# Patient Record
Sex: Female | Born: 1995 | Hispanic: No | Marital: Married | Smoking: Never smoker
Health system: Southern US, Community
[De-identification: ages and names within clinical notes are randomized; demographics above are authoritative.]

## PROBLEM LIST (undated history)

## (undated) DIAGNOSIS — F319 Bipolar disorder, unspecified: Secondary | ICD-10-CM

## (undated) DIAGNOSIS — F32A Depression, unspecified: Secondary | ICD-10-CM

## (undated) DIAGNOSIS — F329 Major depressive disorder, single episode, unspecified: Secondary | ICD-10-CM

## (undated) DIAGNOSIS — F209 Schizophrenia, unspecified: Secondary | ICD-10-CM

## (undated) DIAGNOSIS — E119 Type 2 diabetes mellitus without complications: Secondary | ICD-10-CM

## (undated) HISTORY — DX: Type 2 diabetes mellitus without complications: E11.9

## (undated) HISTORY — DX: Depression, unspecified: F32.A

## (undated) HISTORY — DX: Major depressive disorder, single episode, unspecified: F32.9

---
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
## 2016-05-13 ENCOUNTER — Encounter (HOSPITAL_COMMUNITY): Payer: Self-pay | Admitting: Emergency Medicine

## 2016-08-07 ENCOUNTER — Encounter (HOSPITAL_COMMUNITY): Payer: Self-pay | Admitting: Emergency Medicine

## 2016-08-07 ENCOUNTER — Other Ambulatory Visit: Payer: Self-pay | Admitting: Psychiatry

## 2016-08-07 ENCOUNTER — Emergency Department (HOSPITAL_COMMUNITY)
Admission: EM | Admit: 2016-08-07 | Discharge: 2016-08-08 | Disposition: A | Discharge status: Other Acute Inpatient Hospital | Payer: Self-pay | Attending: Emergency Medicine | Admitting: Emergency Medicine

## 2016-08-07 DIAGNOSIS — R4689 Other symptoms and signs involving appearance and behavior: Secondary | ICD-10-CM

## 2016-08-07 DIAGNOSIS — F312 Bipolar disorder, current episode manic severe with psychotic features: Secondary | ICD-10-CM | POA: Diagnosis present

## 2016-08-07 DIAGNOSIS — M79601 Pain in right arm: Secondary | ICD-10-CM | POA: Insufficient documentation

## 2016-08-07 DIAGNOSIS — F332 Major depressive disorder, recurrent severe without psychotic features: Secondary | ICD-10-CM | POA: Insufficient documentation

## 2016-08-07 DIAGNOSIS — Z79899 Other long term (current) drug therapy: Secondary | ICD-10-CM | POA: Insufficient documentation

## 2016-08-07 LAB — CBC WITH DIFFERENTIAL/PLATELET
BASOS ABS: 0 10*3/uL (ref 0.0–0.1)
Basophils Relative: 1 %
EOS PCT: 0 %
Eosinophils Absolute: 0 10*3/uL (ref 0.0–0.7)
HEMATOCRIT: 38.6 % (ref 36.0–46.0)
Hemoglobin: 12.7 g/dL (ref 12.0–15.0)
LYMPHS ABS: 1.4 10*3/uL (ref 0.7–4.0)
Lymphocytes Relative: 28 %
MCH: 27.2 pg (ref 26.0–34.0)
MCHC: 32.9 g/dL (ref 30.0–36.0)
MCV: 82.7 fL (ref 78.0–100.0)
Monocytes Absolute: 0.4 10*3/uL (ref 0.1–1.0)
Monocytes Relative: 8 %
NEUTROS ABS: 3.2 10*3/uL (ref 1.7–7.7)
NEUTROS PCT: 63 %
PLATELETS: 256 10*3/uL (ref 150–400)
RBC: 4.67 MIL/uL (ref 3.87–5.11)
RDW: 12.5 % (ref 11.5–15.5)
WBC: 5 10*3/uL (ref 4.0–10.5)

## 2016-08-07 LAB — BASIC METABOLIC PANEL
Anion gap: 7 (ref 5–15)
BUN: 7 mg/dL (ref 6–20)
CHLORIDE: 107 mmol/L (ref 101–111)
CO2: 26 mmol/L (ref 22–32)
Calcium: 10.2 mg/dL (ref 8.9–10.3)
Creatinine, Ser: 0.75 mg/dL (ref 0.44–1.00)
Glucose, Bld: 91 mg/dL (ref 65–99)
Potassium: 3.8 mmol/L (ref 3.5–5.1)
SODIUM: 140 mmol/L (ref 135–145)

## 2016-08-07 LAB — RAPID URINE DRUG SCREEN, HOSP PERFORMED
Amphetamines: NOT DETECTED
BARBITURATES: NOT DETECTED
BENZODIAZEPINES: NOT DETECTED
COCAINE: NOT DETECTED
OPIATES: NOT DETECTED
Tetrahydrocannabinol: NOT DETECTED

## 2016-08-07 LAB — I-STAT BETA HCG BLOOD, ED (MC, WL, AP ONLY): I-stat hCG, quantitative: <5 m[IU]/mL (ref <5)

## 2016-08-07 LAB — SALICYLATE LEVEL: Salicylate Lvl: <7 mg/dL (ref 2.8–30.0)

## 2016-08-07 LAB — ETHANOL: Alcohol, Ethyl (B): <5 mg/dL (ref <5)

## 2016-08-07 LAB — ACETAMINOPHEN LEVEL: Acetaminophen (Tylenol), Serum: <10 ug/mL — ABNORMAL LOW (ref 10–30)

## 2016-08-07 NOTE — ED Notes (Addendum)
Patient states she will only speak to someone in [place] even though she is speaking [place].   I offered the language line for patient and she started laughing stating she could speak [place], it was a joke.

## 2016-08-07 NOTE — ED Triage Notes (Signed)
TTS at bedside- pt cooperative at present

## 2016-08-07 NOTE — ED Provider Notes (Signed)
[facility]-EMERGENCY DEPT Provider Note    By signing my name below, I, Earmon PhoenixJennifer Waddell, attest that this documentation has been prepared under the direction and in the presence of Melburn HakeNicole Bueford Arp, PA-C. Electronically Signed: Earmon PhoenixJennifer Waddell, ED Scribe. 08/07/16. 4:02 PM.    History   Chief Complaint Chief Complaint  Patient presents with  . Arm Pain    right  . Medical Clearance    The history is provided by the patient and medical records. No language interpreter was used.    Barbara George is a 21 y.o. female with PMHx of depression brought in by EMS who presents to the Emergency Department stating she does not know why she is here. She reports calling EMS but "does not know why". Pt complains that the weather is crazy here. She reports having mosquito bites but states she does not know where on her body they are located. She denies CP, SOB, numbness, tingling or weakness of the lower extremities, abdominal pain, nausea, vomiting. She denies any current pain and will not answer questions appropriately during exam and keeps responding "what is pian", "what is hurt". She denies HI or SI. Denies alcohol or drug use. EMS reports that patient called complaining of right arm pain after getting her Abilify injection yesterday. Nurse states that patient reported she called 911 states he will get out of her house".   Past Medical History:  Diagnosis Date  . Depression     Patient Active Problem List   Diagnosis Date Noted  . Depression 09/21/2014    History reviewed. No pertinent surgical history.  OB History    No data available       Home Medications    Prior to Admission medications   Medication Sig Start Date End Date Taking? Authorizing Provider  risperiDONE (RISPERDAL) 3 MG tablet Take 3 mg by mouth at bedtime.    Historical Provider, MD  silver sulfADIAZINE (SILVADENE) 1 % cream Apply 1 application topically daily. Patient not taking: Reported on 08/07/2016 11/17/14    Carmelina DaneJeffery S Anderson, MD    Family History No family history on file.  Social History Social History  Substance Use Topics  . Smoking status: Never Smoker  . Smokeless tobacco: Never Used  . Alcohol use No     Allergies   Patient has no known allergies.   Review of Systems Review of Systems  Respiratory: Negative for shortness of breath.   Cardiovascular: Negative for chest pain.  Gastrointestinal: Negative for abdominal pain, nausea and vomiting.  Musculoskeletal: Positive for myalgias.  Psychiatric/Behavioral: Negative for suicidal ideas.     Physical Exam Updated Vital Signs BP 133/83 (BP Location: Left Arm)   Pulse 115   Temp 99.1 F (37.3 C) (Oral)   Resp 20   Wt 70.3 kg   SpO2 100%   BMI 23.92 kg/m   Physical Exam  Constitutional: She is oriented to person, place, and time. She appears well-developed and well-nourished. No distress.  HENT:  Head: Normocephalic and atraumatic.  Mouth/Throat: Oropharynx is clear and moist. No oropharyngeal exudate.  Eyes: Conjunctivae and EOM are normal. Pupils are equal, round, and reactive to light. Right eye exhibits no discharge. Left eye exhibits no discharge. No scleral icterus.  Neck: Normal range of motion. Neck supple.  Cardiovascular: Normal rate, regular rhythm, normal heart sounds and intact distal pulses.   Pulmonary/Chest: Effort normal and breath sounds normal. No respiratory distress. She has no wheezes. She has no rales. She exhibits no tenderness.  Abdominal: Soft.  Bowel sounds are normal. She exhibits no distension and no mass. There is no tenderness. There is no rebound and no guarding.  Musculoskeletal: Normal range of motion. She exhibits no edema, tenderness or deformity.  No midline C, T, or L tenderness. Full range of motion of neck and back. Full range of motion of bilateral upper and lower extremities, with 5/5 strength. Sensation intact. 2+ radial and PT pulses. Cap refill <2 seconds. Patient able to  stand and ambulate without assistance.    Neurological: She is alert and oriented to person, place, and time.  Skin: Skin is warm and dry. Capillary refill takes less than 2 seconds. No rash noted. She is not diaphoretic.  Psychiatric: She has a normal mood and affect. Her speech is normal. Cognition and memory are impaired. She expresses inappropriate judgment. She expresses no homicidal and no suicidal ideation.  Inappropriate behavior  Nursing note and vitals reviewed.    ED Treatments / Results  DIAGNOSTIC STUDIES: Oxygen Saturation is 100% on room air, normal by my interpretation.   COORDINATION OF CARE: 11:55 AM- Will order medical clearance labs. Pt verbalizes understanding and agrees to plan.  Medications - No data to display  Labs (all labs ordered are listed, but only abnormal results are displayed) Labs Reviewed  ACETAMINOPHEN LEVEL - Abnormal; Notable for the following:       Result Value   Acetaminophen (Tylenol), Serum <10 (*)    All other components within normal limits  BASIC METABOLIC PANEL  CBC WITH DIFFERENTIAL/PLATELET  SALICYLATE LEVEL  RAPID URINE DRUG SCREEN, HOSP PERFORMED  ETHANOL  I-STAT BETA HCG BLOOD, ED ([facility], [facility], [facility] ONLY)    EKG  EKG Interpretation None       Radiology No results found.  Procedures Procedures (including critical care time)  Medications Ordered in ED Medications - No data to display   Initial Impression / Assessment and Plan / ED Course  I have reviewed the triage vital signs and the nursing notes.  Pertinent labs & imaging results that were available during my care of the patient were reviewed by me and considered in my medical decision making (see chart for details).     Patient presents to the ED via EMS due to initial complaint of right arm pain after receiving her Abilify injection yesterday. Upon initial evaluation patient denies any pain or complaints and states she does not remember why she came to the ED.  Patient continues to answer questions inappropriately and begins staring off and looking up at the ceiling during interview. Patient was also noted to be found standing staring at the wall. VSS. Exam revealed patient with an appropriate behavior, impaired memory. Exam otherwise unremarkable. Labs and urine unremarkable. Patient medically cleared. Consulted TTS for psych evaluation.   I personally performed the services described in this documentation, which was scribed in my presence. The recorded information has been reviewed and is accurate.   Final Clinical Impressions(s) / ED Diagnoses   Final diagnoses:  Abnormal behavior    New Prescriptions New Prescriptions   No medications on file     Barrett Henle, PA-C 08/07/16 1610    Pricilla Loveless, MD 08/10/16 [phone]

## 2016-08-07 NOTE — ED Notes (Signed)
Attempted to call [facility] to [facility]; nursing staff requesting to take report on next shift, will pass on to oncoming nurse.
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
## 2016-08-07 NOTE — ED Notes (Addendum)
[facility] doesn't wish to take this patient because patient is not IVC and would like the [facility]

## 2016-08-07 NOTE — BH Assessment (Addendum)
Assessment Note  Barbara George is an 21 y.o. female with history of depression. Patient was brought in by EMS. Sts that she called EMS with the complaint of a "hurting heart". Patient was not able to provide any further explanations. She does acknowledge a psychiatric history stating she has been hospitalized many places. Sts, "I was at [facility], "some place in [place], [place], and [place]". She was unable to explain the reason for admissions to those facilities. Writer was unable to find any support documentation of INPT admissions in the [facility] systems. Patient reports suicide thoughts with no plan. Sts she has a history of previous suicide thoughts but could never follow through because of God. Patient stating she is depressed because her mother is in [place] and she is here in [place]. She denies self mutilating behaviors. No HI. Patient denies AVH's. However, it's questionable as patient appeared to be responding to internal stimuli at times. She would often seem distracted during our interaction and often stare at the wall or door. Patient would also laugh inappropriately. She is oriented to person and place. She did not know the date or situation. She denies alcohol and drug use.   Diagnosis: Major Depressive Disorder, Recurrent, Severe, without psychotic features  Past Medical History:  Past Medical History:  Diagnosis Date  . Depression     History reviewed. No pertinent surgical history.  Family History: No family history on file.  Social History:  reports that she has never smoked. She has never used smokeless tobacco. She reports that she does not drink alcohol or use drugs.  Additional Social History:  Alcohol / Drug Use Pain Medications: SEE MAR Prescriptions: SEE MAR Over the Counter: SEE MAR History of alcohol / drug use?: No history of alcohol / drug abuse  CIWA: CIWA-Ar BP: 131/73 Pulse Rate: 106 COWS:    Allergies: No Known Allergies  Home Medications:  (Not in a  hospital admission)  OB/GYN Status:  No LMP recorded.  General Assessment Data [facility] of Assessment: [facility] TTS Assessment: In system Is this a Tele or Face-to-Face Assessment?: Face-to-Face Is this an Initial Assessment or a Re-assessment for this encounter?: Initial Assessment Marital status: Other (comment) Is patient pregnant?: No Pregnancy Status: Unknown Living Arrangements: Other (Comment), Other relatives, Spouse/significant other (father and 2 brothers) Can pt return to current living arrangement?: Yes Admission Status: Voluntary Is patient capable of signing voluntary admission?: Yes Referral Source: Self/Family/Friend Insurance type:  (Sef Pay)     Crisis Care Plan Living Arrangements: Other (Comment), Other relatives, Spouse/significant other (father and 2 brothers) Legal Guardian: Other: (no guardian ) Name of Psychiatrist:  (no psychiatrist ) Name of Therapist:  (no therapist )  Education Status Is patient currently in school?: No Current Grade:  (n/) Highest grade of school patient has completed:  (H.S.) Name of school:  (n/a) Contact person:  (n/a)  Risk to self with the past 6 months Suicidal Ideation: Yes-Currently Present Has patient been a risk to self within the past 6 months prior to admission? : Yes Suicidal Intent: Yes-Currently Present Has patient had any suicidal intent within the past 6 months prior to admission? : Yes Is patient at risk for suicide?: Yes Suicidal Plan?: Yes-Currently Present (pt sts she has a plan but would not provide details ) Has patient had any suicidal plan within the past 6 months prior to admission? : No Specify Current Suicidal Plan:  (no plan reported) Access to Means: No What has been your use of drugs/alcohol within  the last 12 months?:  (denies ) Previous Attempts/Gestures:  (pt reports thoughts only; she did not report a prior attempt) How many times?:  (0) Other Self Harm Risks:  (denies ) Triggers for Past  Attempts: Other (Comment) (patient did not explain ) Intentional Self Injurious Behavior:  (no self injurious behavior) Family Suicide History: No Recent stressful life event(s): Other (Comment) ("My mom is in [place]") Persecutory voices/beliefs?: No Depression: Yes Depression Symptoms: Feeling angry/irritable, Feeling worthless/self pity, Loss of interest in usual pleasures, Guilt, Fatigue, Isolating, Tearfulness, Despondent, Insomnia Substance abuse history and/or treatment for substance abuse?: No Suicide prevention information given to non-admitted patients: Not applicable  Risk to Others within the past 6 months Homicidal Ideation: No Does patient have any lifetime risk of violence toward others beyond the six months prior to admission? : No Thoughts of Harm to Others: No Current Homicidal Intent: No Current Homicidal Plan: No Access to Homicidal Means: No Identified Victim:  (n/a) History of harm to others?: No Assessment of Violence: None Noted Violent Behavior Description:  (anxious; cooperative with redirection) Does patient have access to weapons?: No Criminal Charges Pending?: No Does patient have a court date: No Is patient on probation?: No  Psychosis Hallucinations: None noted Delusions: None noted  Mental Status Report Appearance/Hygiene: Disheveled Eye Contact: Good Motor Activity: Freedom of movement Speech: Logical/coherent Level of Consciousness: Alert Mood: Depressed Affect: Appropriate to circumstance Anxiety Level: None Thought Processes: Coherent, Relevant Judgement: Impaired Orientation: Person, Place, Time, Situation Obsessive Compulsive Thoughts/Behaviors: None  Cognitive Functioning Concentration: Normal Memory: Remote Intact, Recent Intact IQ: Average Insight: Poor Impulse Control: Poor Appetite: Fair Weight Loss:  (none reported ) Weight Gain:  (none reported) Sleep: Decreased Total Hours of Sleep:  ("I don't know") Vegetative  Symptoms: None  ADLScreening [facility] Assessment Services) Patient's cognitive ability adequate to safely complete daily activities?: No Patient able to express need for assistance with ADLs?: Yes Independently performs ADLs?: Yes (appropriate for developmental age)  Prior Inpatient Therapy Prior Inpatient Therapy: Yes Prior Therapy Dates:  ("I don't know") Prior Therapy Facilty/Provider(s):  ("I don't know") Reason for Treatment:  ("I don't know")  Prior Outpatient Therapy Prior Outpatient Therapy: No Prior Therapy Dates:  (n/a) Prior Therapy Facilty/Provider(s):  (n/a) Reason for Treatment:  (n/a) Does patient have an ACCT team?: No Does patient have Intensive In-House Services?  : No Does patient have [facility] services? : No Does patient have P4CC services?: No  ADL Screening (condition at time of admission) Patient's cognitive ability adequate to safely complete daily activities?: No Is the patient deaf or have difficulty hearing?: No Does the patient have difficulty seeing, even when wearing glasses/contacts?: No Does the patient have difficulty concentrating, remembering, or making decisions?: Yes Patient able to express need for assistance with ADLs?: Yes Does the patient have difficulty dressing or bathing?: No Independently performs ADLs?: Yes (appropriate for developmental age) Does the patient have difficulty walking or climbing stairs?: No Weakness of Legs: None Weakness of Arms/Hands: None  Home Assistive Devices/Equipment Home Assistive Devices/Equipment: None    Abuse/Neglect Assessment (Assessment to be complete while patient is alone) Physical Abuse: Denies Verbal Abuse: Denies Sexual Abuse: Denies Exploitation of patient/patient's resources: Denies Self-Neglect: Denies Values / Beliefs Cultural Requests During Hospitalization: None Spiritual Requests During Hospitalization: None   Advance Directives (For Healthcare) Does Patient Have a Medical Advance  Directive?: No Would patient like information on creating a medical advance directive?: No - Patient declined Nutrition Screen- [facility] Patient's home diet: Regular  Additional  Information 1:1 In Past 12 Months?: No CIRT Risk: No Elopement Risk: No Does patient have medical clearance?: Yes     Disposition:  Disposition Initial Assessment Completed for this Encounter: Yes  On Site Evaluation by:   Reviewed with Physician:    Melynda Ripple 08/07/2016 1:47 PM

## 2016-08-07 NOTE — ED Triage Notes (Signed)
Pt was escorted to bathroom. When asked for urine specimen , she dipped cup in toilet, then put paper in cup with urine.

## 2016-08-07 NOTE — ED Notes (Signed)
Note to hold for anni penn charted in error.  disregard

## 2016-08-07 NOTE — ED Provider Notes (Signed)
Received care of patient at 4 PM. Please see prior notes for history and physical. Patient is a 21 year old female who presented a with bizarre behavior. On my exam, patient appears to be responding to internal stimuli. She reports that she is here in the emergency department because she "feel like Aisha, the wife of the prophet Gwenyth BouillonMohammad."  When asked other questions she appears distracted at times reports "he's gone now." Reports suicidal ideation at times.  IVC placed.   Pt accepted for inpt.   Alvira MondayErin Devan Babino, MD 08/07/16 [phone]

## 2016-08-07 NOTE — ED Notes (Signed)
Pt taking a shower 

## 2016-08-07 NOTE — ED Notes (Signed)
MIbou Hamadou is patient's father and his phone number is [phone].

## 2016-08-07 NOTE — BH Assessment (Signed)
Per Charge Nurse Marylu Lund(Janet) [facility]  The accepting physician is Dr. Jennet MaduroPucilowska.  The number for the nurse to give report is [phone].  The patient can come at any time.  Writer informed the [facility] Therapist [facility]

## 2016-08-07 NOTE — ED Notes (Signed)
Patient does not eat pork.   

## 2016-08-07 NOTE — ED Notes (Signed)
Pt admitted to room #39, pt withdrawn, poor eye contact during assessment.  when asked what brought her to the hospital pt reports "because my dad doesn't let me go out." . Pt reports sadness d/t "My past and present."  Pt reports she has been off her medication for 4 months, "They say I'm sick." Encouragement and support provided. Special checks q 15 mins in place for safety. Video monitoring in place. Will continue to monitor.

## 2016-08-07 NOTE — ED Notes (Signed)
[facility] Expected date:  Expected time:  Means of arrival:  Comments: Hold for [place] 2

## 2016-08-07 NOTE — BH Assessment (Signed)
Writer spoke to the Charge Nurse (Janet) regarding the patient being placed at [facility].  Janet is reviewing the referral packet.  

## 2016-08-07 NOTE — ED Triage Notes (Signed)
Per gems pt from home , reports R arm pain after Abilify injection done yesterday.

## 2016-08-07 NOTE — ED Notes (Signed)
Pt requesting something to write with, crayon and paper provided.

## 2016-08-08 ENCOUNTER — Inpatient Hospital Stay
Admission: EM | Admit: 2016-08-08 | Discharge: 2016-08-16 | DRG: 885 | Disposition: A | Discharge status: Home / Self Care | Payer: No Typology Code available for payment source | Source: Intra-hospital | Attending: Psychiatry | Admitting: Psychiatry

## 2016-08-08 DIAGNOSIS — F312 Bipolar disorder, current episode manic severe with psychotic features: Secondary | ICD-10-CM | POA: Diagnosis present

## 2016-08-08 DIAGNOSIS — G4700 Insomnia, unspecified: Secondary | ICD-10-CM | POA: Diagnosis present

## 2016-08-08 MED ORDER — ALUM & MAG HYDROXIDE-SIMETH 200-200-20 MG/5ML PO SUSP: 30.0000 mL | ORAL | Status: DC | PRN

## 2016-08-08 MED ORDER — LORAZEPAM 2 MG/ML IJ SOLN
2.0000 mg | Freq: Once | INTRAMUSCULAR | Status: AC
  Administered 2016-08-08: 2 mg via INTRAMUSCULAR
  Filled 2016-08-08: qty 1

## 2016-08-08 MED ORDER — LORAZEPAM 2 MG PO TABS
2.0000 mg | ORAL_TABLET | ORAL | Status: DC | PRN
  Administered 2016-08-10 – 2016-08-12 (×5): 2 mg via ORAL
  Filled 2016-08-08 (×6): qty 1

## 2016-08-08 MED ORDER — HALOPERIDOL 5 MG PO TABS
5.0000 mg | ORAL_TABLET | Freq: Four times a day (QID) | ORAL | Status: DC | PRN
  Administered 2016-08-10 – 2016-08-12 (×4): 5 mg via ORAL
  Filled 2016-08-08 (×5): qty 1

## 2016-08-08 MED ORDER — RISPERIDONE 3 MG PO TABS
3.0000 mg | ORAL_TABLET | Freq: Every day | ORAL | Status: DC
  Administered 2016-08-08 – 2016-08-09 (×2): 3 mg via ORAL
  Filled 2016-08-08 (×3): qty 1

## 2016-08-08 MED ORDER — HYDROXYZINE HCL 50 MG PO TABS: 50.0000 mg | ORAL_TABLET | Freq: Three times a day (TID) | ORAL | Status: DC | PRN

## 2016-08-08 MED ORDER — STERILE WATER FOR INJECTION IJ SOLN
INTRAMUSCULAR | Status: AC
  Administered 2016-08-08: 10 mL
  Filled 2016-08-08: qty 10

## 2016-08-08 MED ORDER — MAGNESIUM HYDROXIDE 400 MG/5ML PO SUSP: 30.0000 mL | Freq: Every day | ORAL | Status: DC | PRN

## 2016-08-08 MED ORDER — ZIPRASIDONE MESYLATE 20 MG IM SOLR
20.0000 mg | Freq: Once | INTRAMUSCULAR | Status: AC
  Administered 2016-08-08: 20 mg via INTRAMUSCULAR
  Filled 2016-08-08: qty 20

## 2016-08-08 MED ORDER — WHITE PETROLATUM GEL
Status: DC | PRN
  Filled 2016-08-08 (×2): qty 5

## 2016-08-08 MED ORDER — ONDANSETRON HCL 4 MG PO TABS
4.0000 mg | ORAL_TABLET | Freq: Four times a day (QID) | ORAL | Status: DC | PRN
  Administered 2016-08-08 – 2016-08-11 (×2): 4 mg via ORAL
  Filled 2016-08-08 (×3): qty 1

## 2016-08-08 MED ORDER — ACETAMINOPHEN 325 MG PO TABS
650.0000 mg | ORAL_TABLET | Freq: Four times a day (QID) | ORAL | Status: DC | PRN
  Administered 2016-08-11 – 2016-08-16 (×4): 650 mg via ORAL
  Filled 2016-08-08 (×4): qty 2

## 2016-08-08 MED ORDER — LORAZEPAM 2 MG/ML IJ SOLN
2.0000 mg | INTRAMUSCULAR | Status: DC | PRN
  Filled 2016-08-08: qty 1

## 2016-08-08 MED ORDER — TRAZODONE HCL 100 MG PO TABS: 100.0000 mg | ORAL_TABLET | Freq: Every evening | ORAL | Status: DC | PRN

## 2016-08-08 MED ORDER — HALOPERIDOL LACTATE 5 MG/ML IJ SOLN: 5.0000 mg | Freq: Four times a day (QID) | INTRAMUSCULAR | Status: DC | PRN

## 2016-08-08 MED ORDER — DIPHENHYDRAMINE HCL 50 MG/ML IJ SOLN
50.0000 mg | Freq: Four times a day (QID) | INTRAMUSCULAR | Status: DC | PRN
  Filled 2016-08-08: qty 1

## 2016-08-08 MED ORDER — INFLUENZA VAC SPLIT QUAD 0.5 ML IM SUSY: 0.5000 mL | PREFILLED_SYRINGE | INTRAMUSCULAR | Status: DC

## 2016-08-08 MED ORDER — DIPHENHYDRAMINE HCL 25 MG PO CAPS
50.0000 mg | ORAL_CAPSULE | Freq: Four times a day (QID) | ORAL | Status: DC | PRN
  Administered 2016-08-11 – 2016-08-12 (×2): 50 mg via ORAL
  Filled 2016-08-08 (×3): qty 2

## 2016-08-08 MED ORDER — DIPHENHYDRAMINE HCL 50 MG/ML IJ SOLN
50.0000 mg | Freq: Once | INTRAMUSCULAR | Status: AC
Start: 2016-08-08 — End: 2016-08-08
  Administered 2016-08-08: 50 mg via INTRAMUSCULAR
  Filled 2016-08-08: qty 1

## 2016-08-08 NOTE — Progress Notes (Signed)
Pt approached this nurse asking about discharge. Informed pt that discharge is up to the doctor and the doctor would be available for her to see in the morning. Pt reports she came to the hospital because she called 911 because her "father wouldn't let her go outside and get some fresh air." Pt stated "i was just kidding him, but then they wanted to talk to me and came to pick me up." Pt denies SI/HI/AVH. States, "I do not need to be here." Pt does report she was given Abilify infection day before yesterday (08/06/2016), reported she took the pills before being prescribed the injection. Pt reports that she has been admitted to psychiatric treatment in the past, [place]. Pt reports she will return to live with her father on discharge. Support and encouragement provided. Safety maintained with every 15 minute checks. Will continue to monitor.

## 2016-08-08 NOTE — ED Notes (Signed)
Patient ripped magazine up and threw it on floor and threw bible down kicked it down hall. Staff asked patient if she would pick up torn magazine off the floor patient refused. Patient then went to her room and started pulling the code blue button and refused to stop. Staff tried to discourage patient from pulling code button with no success. Patient is unable to redirect patient at this time. Encouragement and support provided and safety maintain. Q 15 min safety checks remain in place and video monitoring.

## 2016-08-08 NOTE — BHH Counselor (Signed)
CSW attempted to complete psychosocial assessment with patient. Patient disorganized and drowsy at this time. Pt requested that CSW comes back after patient has showered and feels better. CSW agreeable, will meet with patient at a later time.    Hampton AbbotKadijah Artemis Loyal, MSW, LCSW-A 08/08/2016, 1:21PM

## 2016-08-08 NOTE — ED Notes (Signed)
Patient currently denies SI,HI and AVH. Patient states "I am depressed because my mother is in [place] and I am here". Plan of care discussed and encouragement and support provided and safety maintain. Q 15 min safety checks remain in place.

## 2016-08-08 NOTE — BHH Counselor (Signed)
Adult Comprehensive Assessment  Patient ID: Barbara DankerCharifatou George, female   DOB: September 09, 1995, 21 y.o.   MRN: [id number]  Information Source: Information source: Patient  Current Stressors:  Educational / Learning stressors: No stressors identified  Employment / Job issues: Pt stated that she does not work or never has worked  Family Relationships: Strained relationships due to past traumatic events  Surveyor, quantityinancial / Lack of resources (include bankruptcy): No stressors identified  Housing / Lack of housing: No stressors identified  Physical health (include injuries & life threatening diseases): No stressors identified  Social relationships: No stressors identified  Substance abuse: No stressors identified  Bereavement / Loss: No stressors identified   Living/Environment/Situation:  Living Arrangements: Parent Living conditions (as described by patient or guardian): They are okay  How long has patient lived in current situation?: 2-3 years  What is atmosphere in current home: Comfortable  Family History:  Marital status: Other (comment) What is your sexual orientation?: Heterosexual Has your sexual activity been affected by drugs, alcohol, medication, or emotional stress?: N/A Does patient have children?: No  Childhood History:  By whom was/is the patient raised?: Both parents, Grandparents Description of patient's relationship with caregiver when they were a child: Sometimes good and sometimes bad with parents  Patient's description of current relationship with people who raised him/her: Relationship with father is tough - lives with father  How were you disciplined when you got in trouble as a child/adolescent?: None identified  Does patient have siblings?: Yes Number of Siblings: 2 Description of patient's current relationship with siblings: Get along well with two brothers (21 year old and 21 year old)  Did patient suffer any verbal/emotional/physical/sexual abuse as a child?: Yes Did  patient suffer from severe childhood neglect?: No Has patient ever been sexually abused/assaulted/raped as an adolescent or adult?: No Was the patient ever a victim of a crime or a disaster?: No Witnessed domestic violence?: No Has patient been effected by domestic violence as an adult?: No  Education:  Highest grade of school patient has completed: Graduated high school  Currently a student?: No Learning disability?: No  Employment/Work Situation:   Employment situation: Unemployed Patient's job has been impacted by current illness: No What is the longest time patient has a held a job?: Never worked  Where was the patient employed at that time?: Never worked  Has patient ever been in the Eli Lilly and Companymilitary?: No Has patient ever served in Buyer, retailcombat?: No Did You Receive Any Psychiatric Treatment/Services While in Equities traderthe Military?: No Are There Guns or Education officer, communityther Weapons in Your Home?: No Are These ComptrollerWeapons Safely Secured?:  (N/A)  Financial Resources:   Financial resources: Support from parents / caregiver Does patient have a Lawyerrepresentative payee or guardian?: No  Alcohol/Substance Abuse:   What has been your use of drugs/alcohol within the last 12 months?: None reported  If attempted suicide, did drugs/alcohol play a role in this?: No Alcohol/Substance Abuse Treatment Hx: Denies past history Has alcohol/substance abuse ever caused legal problems?: No  Social Support System:   Forensic psychologistatient's Community Support System: Fair Museum/gallery exhibitions officerDescribe Community Support System: Has some friends and family that is supportive  Type of faith/religion: Islam  How does patient's faith help to cope with current illness?: Prayer, reading   Leisure/Recreation:   Leisure and Hobbies: Pt stated that she does not have any hobbies   Strengths/Needs:   What things does the patient do well?: Cooking, cleaning  In what areas does patient struggle / problems for patient: Pt stated she wants to  reform her life   Discharge Plan:   Does  patient have access to transportation?: Yes Will patient be returning to same living situation after discharge?: Yes Does patient have financial barriers related to discharge medications?: Yes Patient description of barriers related to discharge medications: Patient's provider has reduced medication in pharmacy  Summary/Recommendations:   Summary and Recommendations (to be completed by the evaluator): Patient presented to the hospital admitted for depressive symptoms and suicidal ideation. Pt is a 21 year old woman living in [place], [place]. Pt's primary diagnosis is Major Depressive Disorder. Pt reports primary triggers for admission are family conflict and relationship conflict from past. Pt lists supports as her father and two brothers she lives with. She stated that her mother is still in [place] and is supportive as she can be. Patient will benefit from crisis stabilization, medication evaluation, group therapy, and psycho education in addition to case management for discharge planning. Patient and CSW reviewed pt's identified goals and treatment plan. Pt verbalized understanding and agreed to treatment plan. At discharge it is recommended that patient remain compliant with established plan and continue treatment.  Lynden Oxford, MSW, LCSW-A  08/08/2016

## 2016-08-08 NOTE — ED Notes (Signed)
Attempted to call report to [facility] prior to transporting pt to the number provided by Rayann HemanAda Stevenson; [phone], twice. Lizbeth BarkJanie Rambo, RN, aware that this was attempted. The sheriff was here for transport. Pt was up, had gone to the bathroom, and changed clothes. She allowed BP, pulse, O2 to be taken. She was given a blanket for transport. This Clinical research associatewriter escorted pt off the unit to the sheriff vehicle and she was able to climb into the vehicle without assist.

## 2016-08-08 NOTE — ED Notes (Signed)
Donell SievertSpencer Simon, PA contacted and informed of patient's behavior. New orders received for Geodon 20 mg IM x 1 dose, benadryl 50 mg IM x 1 dose and Ativan 2 mg IM x 1 dose orders read back and verified.

## 2016-08-08 NOTE — Progress Notes (Signed)
Patient walked to the unit with a slightly shuffling gait.Patients affect sad but cooperative during admission assessment. Patient denies SI/HI at this time. Patient denies AVH. Patient informed of fall risk status, fall risk assessed "low" at this time. Patient oriented to unit/staff/room. Patient denies any questions/concerns at this time. Patient safe on unit with Q15 minute checks for safety.Skin assessment & body search done,no contraband found.

## 2016-08-08 NOTE — [facility] Suicide Risk Assessment (Addendum)
[facility] INPATIENT:  Family/Significant Other Suicide Prevention Education  Suicide Prevention Education:  Contact Attempts: father, Barbara George ph#: [phone] has been identified by the patient as the family member/significant other with whom the patient will be residing, and identified as the person(s) who will aid the patient in the event of a mental health crisis.  With written consent from the patient, two attempts were made to provide suicide prevention education, prior to and/or following the patient's discharge.  We were unsuccessful in providing suicide prevention education.  A suicide education pamphlet was given to the patient to share with family/significant other.  Date and time of first attempt: 08/08/2016 @ 3:32PM   Barbara George, MSW, LCSW-A 08/08/2016, 3:33 PM

## 2016-08-08 NOTE — ED Notes (Signed)
[facility] [place] [facility] and informed of patient's IVC status and the request for [facility].

## 2016-08-08 NOTE — [facility] Group Notes (Signed)
  [facility] LCSW Group Therapy Note  Date/Time: 08/08/16, 1300  Type of Therapy/Topic:  Group Therapy:  Emotion Regulation  Participation Level:  Did Not Attend   Mood:  Description of Group:    The purpose of this group is to assist patients in learning to regulate negative emotions and experience positive emotions. Patients will be guided to discuss ways in which they have been vulnerable to their negative emotions. These vulnerabilities will be juxtaposed with experiences of positive emotions or situations, and patients challenged to use positive emotions to combat negative ones. Special emphasis will be placed on coping with negative emotions in conflict situations, and patients will process healthy conflict resolution skills.  Therapeutic Goals: 1. Patient will identify two positive emotions or experiences to reflect on in order to balance out negative emotions:  2. Patient will label two or more emotions that they find the most difficult to experience:  3. Patient will be able to demonstrate positive conflict resolution skills through discussion or role plays:   Summary of Patient Progress:       Therapeutic Modalities:   Cognitive Behavioral Therapy Feelings Identification Dialectical Behavioral Therapy  Greg Kalani Baray, LCSW 

## 2016-08-08 NOTE — Tx Team (Signed)
Initial Treatment Plan 08/08/2016 3:21 PM Barbara George WUJ:[id number]    PATIENT STRESSORS: Marital or family conflict Occupational concerns Traumatic event   PATIENT STRENGTHS: Average or above average intelligence Communication skills Supportive family/friends   PATIENT IDENTIFIED PROBLEMS: Depression 08/09/2015  Anxiety 08/09/2015                   DISCHARGE CRITERIA:  Adequate post-discharge living arrangements Improved stabilization in mood, thinking, and/or behavior Verbal commitment to aftercare and medication compliance  PRELIMINARY DISCHARGE PLAN: Attend aftercare/continuing care group Return to previous work or school arrangements  PATIENT/FAMILY INVOLVEMENT: This treatment plan has been presented to and reviewed with the patient, Barbara George, and/or family member,  The patient and family have been given the opportunity to ask questions and make suggestions.  Leonarda SalonGigi George Dail Meece, RN 08/08/2016, 3:21 PM

## 2016-08-09 DIAGNOSIS — F312 Bipolar disorder, current episode manic severe with psychotic features: Principal | ICD-10-CM

## 2016-08-09 LAB — LIPID PANEL
CHOLESTEROL: 150 mg/dL (ref 0–200)
HDL: 69 mg/dL (ref >40)
LDL Cholesterol: 71 mg/dL (ref 0–99)
TRIGLYCERIDES: 51 mg/dL (ref <150)
Total CHOL/HDL Ratio: 2.2 RATIO
VLDL: 10 mg/dL (ref 0–40)

## 2016-08-09 LAB — TSH: TSH: 1.03 u[IU]/mL (ref 0.350–4.500)

## 2016-08-09 MED ORDER — BACITRACIN ZINC 500 UNIT/GM EX OINT
TOPICAL_OINTMENT | Freq: Two times a day (BID) | CUTANEOUS | Status: DC
  Filled 2016-08-09: qty 28.35

## 2016-08-09 MED ORDER — BACITRACIN 500 UNIT/GM EX OINT
TOPICAL_OINTMENT | CUTANEOUS | Status: DC | PRN
  Filled 2016-08-09: qty 500

## 2016-08-09 MED ORDER — TEMAZEPAM 15 MG PO CAPS
15.0000 mg | ORAL_CAPSULE | Freq: Every day | ORAL | Status: DC
  Administered 2016-08-09: 15 mg via ORAL
  Filled 2016-08-09: qty 1

## 2016-08-09 NOTE — Progress Notes (Signed)
Patient is sad and depressed. Passive SI, no plan. States she feels lonely. Spoke with her father via pay telephone. Childlike and needy. Appearance neat and well kept. Showered. Snacks offered and accepted. Med compliant. No PRNs given. Med adm with education. Denies AVH. No behavior issues noted. Safety maintained with q 15 min checks.

## 2016-08-09 NOTE — Progress Notes (Signed)
Patient intrusive, and back and forth to nurses station for requests. Pt noted on floor rolling around on floor this am near phones, writer asked patient to get off of the floor patient continued to roll around. I informed patient that this behavior would only affect her discharging. Pt then got off the floor and asked "well how do I get out of here." Patient than began to speak normal and participate.  Patient returned to nurses stating that she reports she wants to be negative and she will stay here forever. Pt continues to act as if she does not understand, and then states 'I was only kidding" Patient in hallway with bra on and towel wrapped around her. Patient needs constant re-direction on unit.  Remains safe with q 15 min checks.

## 2016-08-09 NOTE — [facility] Group Notes (Signed)
[facility] Group Notes:  (Nursing/MHT/Case Management/Adjunct)  Date:  08/09/2016  Time:  9:50 PM  Type of Therapy:  Evening Wrap-up Group  Participation Level:  Minimal  Participation Quality:  Appropriate  Affect:  Flat  Cognitive:  Appropriate  Insight:  Limited  Engagement in Group:  Minimal  Modes of Intervention:  Discussion  Summary of Progress/Problems:  Tomasita MorrowChelsea Nanta Sumaiya Arruda 08/09/2016, 9:50 PM

## 2016-08-09 NOTE — [facility] Suicide Risk Assessment (Signed)
[facility]   Nursing information obtained from:    Demographic factors:    Current Mental Status:    Loss Factors:    Historical Factors:    Risk Reduction Factors:     Total Time spent with patient: 1 hour Principal Problem: Bipolar I disorder, current or most recent episode manic, with psychotic features [facility](HCC) Diagnosis:   Patient Active Problem List   Diagnosis Date Noted  . Bipolar I disorder, current or most recent episode manic, with psychotic features (HCC) [F31.2] 08/08/2016   Subjective Data: psychotic brake.  Continued Clinical Symptoms:  Alcohol Use Disorder Identification Test Final Score (AUDIT): 0 The "Alcohol Use Disorders Identification Test", Guidelines for Use in Primary Care, Second Edition.  World Science [employer]Health [employer] [facility](WHO). Score between 0-7:  no or low risk or alcohol related problems. Score between 8-15:  moderate risk of alcohol related problems. Score between 16-19:  high risk of alcohol related problems. Score 20 or above:  warrants further diagnostic evaluation for alcohol dependence and treatment.   CLINICAL FACTORS:   Bipolar Disorder:   Mixed State Currently Psychotic   Musculoskeletal: Strength & Muscle Tone: within normal limits Gait & Station: normal Patient leans: N/A  Psychiatric Specialty Exam: Physical Exam  Nursing note and vitals reviewed. Psychiatric: Her speech is normal. Her affect is angry, labile and inappropriate. She is hyperactive and actively hallucinating. Thought content is paranoid and delusional. Cognition and memory are normal. She expresses impulsivity and inappropriate judgment.    Review of Systems  Psychiatric/Behavioral: Positive for hallucinations. The patient has insomnia.   All other systems reviewed and are negative.   Blood pressure 122/79, pulse (!) 111, temperature 98.6 F (37 C), resp. rate 18, height 5\' 8"  (1.727 m), weight 66.7 kg (147 lb), SpO2 96 %.Body mass index is 22.35  kg/m.  General Appearance: Casual  Eye Contact:  Good  Speech:  Clear and Coherent  Volume:  Normal  Mood:  Angry, Dysphoric and Irritable  Affect:  Congruent  Thought Process:  Disorganized and Descriptions of Associations: Tangential  Orientation:  Full (Time, Place, and Person)  Thought Content:  Delusions, Hallucinations: Auditory and Paranoid Ideation  Suicidal Thoughts:  No  Homicidal Thoughts:  No  Memory:  Immediate;   Fair Recent;   Fair Remote;   Fair  Judgement:  Poor  Insight:  Lacking  Psychomotor Activity:  Increased  Concentration:  Concentration: Fair and Attention Span: Fair  Recall:  [employer]Fair  Fund of Knowledge:  Fair  Language:  Fair  Akathisia:  No  Handed:  Right  AIMS (if indicated):     Assets:  Communication Skills Desire for Improvement Housing Physical Health Resilience Social Support  ADL's:  Intact  Cognition:  WNL  Sleep:  Number of Hours: 6.3      COGNITIVE FEATURES THAT CONTRIBUTE TO RISK:  None    SUICIDE RISK:   Moderate:  Frequent suicidal ideation with limited intensity, and duration, some specificity in terms of plans, no associated intent, good self-control, limited dysphoria/symptomatology, some risk factors present, and identifiable protective factors, including available and accessible social support.  PLAN OF CARE: Hospital admission, medication management, discharge planning.  Ms. Kandis NabHamadou is a 21 year old female with a history of bipolar disorder admitted in a psychotic, manic episode.  1. Mood and psychosis. The patient has been maintained on injections of Abilify maintena. Her last injection was given recently. She was started on Risperdal in the emergency room. We will increase dose to 3  mg twice daily. We will start Depakote for mood stabilization.   2. Agitation. There are orders for Haldol, Ativan and Benadryl if needed.  3. Insomnia. Will give Restoril.  4. Metabolic syndrome monitoring. Lipid panel, TSH, hemoglobin  A1c are pending.  5. EKG. Pending.  6. Disposition. She will be discharged to home with family. She will follow up with [facility] in [facility].    I certify that inpatient services furnished can reasonably be expected to improve the patient's condition.   Kristine Linea, MD 08/09/2016, 11:20 AM

## 2016-08-09 NOTE — [facility] Group Notes (Signed)
Goals Group Date/Time: 08/09/2016 9:00 AM Type of Therapy and Topic: Group Therapy: Goals Group: SMART Goals   Participation Level: Moderate  Description of Group:    The purpose of a daily goals group is to assist and guide patients in setting recovery/wellness-related goals. The objective is to set goals as they relate to the crisis in which they were admitted. Patients will be using SMART goal modalities to set measurable goals. Characteristics of realistic goals will be discussed and patients will be assisted in setting and processing how one will reach their goal. Facilitator will also assist patients in applying interventions and coping skills learned in psycho-education groups to the SMART goal and process how one will achieve defined goal.   Therapeutic Goals:   -Patients will develop and document one goal related to or their crisis in which brought them into treatment.  -Patients will be guided by LCSW using SMART goal setting modality in how to set a measurable, attainable, realistic and time sensitive goal.  -Patients will process barriers in reaching goal.  -Patients will process interventions in how to overcome and successful in reaching goal.   Patient's Goal: Pt made several comments during group that are difficult to understand and unrelated to the discussion.  She shared that her goal for the day is "peace".   Therapeutic Modalities:  Motivational Interviewing  Research officer, political partyCognitive Behavioral Therapy  Crisis Intervention Model  SMART goals setting   Daleen SquibbGreg Jacques [place]illingham, [place]

## 2016-08-09 NOTE — Plan of Care (Signed)
Problem: Coping: Goal: Ability to verbalize frustrations and anger appropriately will improve Outcome: Not Met (add Reason) Patient remains sad and depressed. Flat affect. States she feels lonely. Spoke with her father via pay phone. Med compliant. No behavior issues noted. Safety maintained with q 15 min checks.

## 2016-08-09 NOTE — [facility] Group Notes (Signed)
[facility] LCSW Group Therapy Note  Date/Time: 08/09/16, 0930 and 1300  Type of Therapy/Topic:  Group Therapy:  Balance in Life  Participation Level:   unrelated comments  Description of Group:    This group will address the concept of balance and how it feels and looks when one is unbalanced. Patients will be encouraged to process areas in their lives that are out of balance, and identify reasons for remaining unbalanced. Facilitators will guide patients utilizing problem- solving interventions to address and correct the stressor making their life unbalanced. Understanding and applying boundaries will be explored and addressed for obtaining  and maintaining a balanced life. Patients will be encouraged to explore ways to assertively make their unbalanced needs known to significant others in their lives, using other group members and facilitator for support and feedback.  Therapeutic Goals: 1. Patient will identify two or more emotions or situations they have that consume much of in their lives. 2. Patient will identify signs/triggers that life has become out of balance:  3. Patient will identify two ways to set boundaries in order to achieve balance in their lives:  4. Patient will demonstrate ability to communicate their needs through discussion and/or role plays  Summary of Patient Progress: Pt wandered into the AM group wearing a bathtowel as a shirt, along with a winter coat.  She made several comments and said that "relationships were out of balance.  She attended the entire afternoon group, and was the only attendee at the end.  She participated in the discussion along with one other pt but was unable to meaningfully engage with the information presented in the group.          Therapeutic Modalities:   Cognitive Behavioral Therapy Solution-Focused Therapy Assertiveness Training  Daleen SquibbGreg Lillia Lengel, [place]CS[place]

## 2016-08-09 NOTE — [facility] Suicide Risk Assessment (Signed)
[facility] INPATIENT:  Family/Significant Other Suicide Prevention Education  Suicide Prevention Education:  Education Completed; father, Barbara George ph#: [phone] has been identified by the patient as the family member/significant other with whom the patient will be residing, and identified as the person(s) who will aid the patient in the event of a mental health crisis (suicidal ideations/suicide attempt).  With written consent from the patient, the family member/significant other has been provided the following suicide prevention education, prior to the and/or following the discharge of the patient.  The suicide prevention education provided includes the following:  Suicide risk factors  Suicide prevention and interventions  National Suicide Hotline telephone number  [facility] assessment telephone number  [facility] Assistance 911  [facility] and/or Residential Mobile Crisis Unit telephone number  Request made of family/significant other to:  Remove weapons (e.g., guns, rifles, knives), all items previously/currently identified as safety concern.    Remove drugs/medications (over-the-counter, prescriptions, illicit drugs), all items previously/currently identified as a safety concern.  The family member/significant other verbalizes understanding of the suicide prevention education information provided.  The family member/significant other agrees to remove the items of safety concern listed above.  Barbara George, MSW, LCSW-A 08/09/2016, 10:14 AM

## 2016-08-09 NOTE — H&P (Signed)
Psychiatric Admission Assessment Adult  Patient Identification: Barbara DankerCharifatou George MRN:  [id number] Date of Evaluation:  08/09/2016 Chief Complaint:  Major Depressive Disorder Principal Diagnosis: Bipolar I disorder, current or most recent episode manic, with psychotic features [facility](HCC) Diagnosis:   Patient Active Problem List   Diagnosis Date Noted  . Bipolar I disorder, current or most recent episode manic, with psychotic features (HCC) [F31.2] 08/08/2016   History of Present Illness:   Identifying data. Barbara George is a 21 year old female with a history of mood instability and psychosis.  Chief complaint. "I disobeyed Satan."  History of present illness. Information was obtained from the patient and the chart. At the patient reports getting "crazy" after Barbara George graduated from high school. This prevented her from going to college. Barbara George has been maintained on injections of Abilify maintena prescribed by her primary [facility]. Her last injection was on March 5. The following day the patient arrived in the emergency room [facility] complaining of severe pain at the site of injection. Barbara George believes that Barbara George was given "milk". The patient is rather disorganized and gamy. Barbara George frequently regresses to her native tongue and refuses to answer in [place]. Barbara George is paranoid, hyperreligious, disorganized. Barbara George has been throwing things around the unit, walking around in her bra, not easily redirected. Barbara George reports auditory hallucinations but is unwilling to talk about the content. On direct questioning Barbara George denies any other symptoms of depression, anxiety, symptoms suggestive of bipolar mania, or substance use. Barbara George thinks Barbara George is "crazy". Barbara George does report extremely poor sleep and weight loss. Barbara George reports poor appetite but denies feeling paranoid about her food.  Past psychiatric history. Barbara George was hospitalized in [place] for a psychotic break. Barbara George has been maintained on Abilify injections. Barbara George denies  ever attempting suicide. We spoke with her doctor did not provide any additional information.  Family psychiatric history. Everybody is "crazy".  Social history. Barbara George is originally from [place]. Barbara George came to the [place] with her father and 2 younger brothers when Barbara George was 21 years old. Barbara George tells me that Barbara George graduated from high school and has plans to go to college but sickness prevented her from doing so. Her mother still lives in [place]. The patient visited with her a few months ago which suggests that Barbara George was doing well then. Barbara George could take foreign travel. Barbara George was reportedly engaged to marry but decided against it as Barbara George did not want to be the second wife of a Muslim man. Barbara George was in another relationship that ended up poorly as her private pictures were posted on the Internet. Barbara George lives with her father and 2 younger brothers. Barbara George attends Mosque regularly and has support in the Muslim community.  Total Time spent with patient: 1 hour  Is the patient at risk to self? No.  Has the patient been a risk to self in the past 6 months? No.  Has the patient been a risk to self within the distant past? No.  Is the patient a risk to others? No.  Has the patient been a risk to others in the past 6 months? No.  Has the patient been a risk to others within the distant past? No.   Prior Inpatient Therapy:   Prior Outpatient Therapy:    Alcohol Screening: 1. How often do you have a drink containing alcohol?: Never 2. How many drinks containing alcohol do you have on a typical day when you are drinking?: 1 or 2 3. How often do you have  six or more drinks on one occasion?: Never Preliminary Score: 0 4. How often during the last year have you found that you were not able to stop drinking once you had started?: Never 5. How often during the last year have you failed to do what was normally expected from you becasue of drinking?: Never 6. How often during the last year have you needed a first drink in the morning to get  yourself going after a heavy drinking session?: Never 7. How often during the last year have you had a feeling of guilt of remorse after drinking?: Never 8. How often during the last year have you been unable to remember what happened the night before because you had been drinking?: Never 9. Have you or someone else been injured as a result of your drinking?: No 10. Has a relative or friend or a doctor or another health worker been concerned about your drinking or suggested you cut down?: No Alcohol Use Disorder Identification Test Final Score (AUDIT): 0 Brief Intervention: AUDIT score less than 7 or less-screening does not suggest unhealthy drinking-brief intervention not indicated Substance Abuse History in the last 12 months:  No. Consequences of Substance Abuse: NA Previous Psychotropic Medications: Yes  Psychological Evaluations: No  Past Medical History:  Past Medical History:  Diagnosis Date  . Depression    History reviewed. No pertinent surgical history. Family History: History reviewed. No pertinent family history.  Tobacco Screening: Have you used any form of tobacco in the last 30 days? (Cigarettes, Smokeless Tobacco, Cigars, and/or Pipes): No Social History:  History  Alcohol Use No     History  Drug Use No    Additional Social History: Marital status: Other (comment) What is your sexual orientation?: Heterosexual Has your sexual activity been affected by drugs, alcohol, medication, or emotional stress?: N/A Does patient have children?: No    History of alcohol / drug use?: No history of alcohol / drug abuse                    Allergies:   Allergies  Allergen Reactions  . Peanut-Containing Drug Products Nausea And Vomiting  . Pork-Derived Products Other (See Comments)    Religious purposes   Lab Results:  Results for orders placed or performed during the hospital encounter of 08/08/16 (from the past 48 hour(s))  Lipid panel     Status: None    Collection Time: 08/09/16  7:07 AM  Result Value Ref Range   Cholesterol 150 0 - 200 mg/dL   Triglycerides 51 <782 mg/dL   HDL 69 >95 mg/dL   Total CHOL/HDL Ratio 2.2 RATIO   VLDL 10 0 - 40 mg/dL   LDL Cholesterol 71 0 - 99 mg/dL    Comment:        Total Cholesterol/HDL:CHD Risk Coronary Heart Disease Risk Table                     Men   Women  1/2 Average Risk   3.4   3.3  Average Risk       5.0   4.4  2 X Average Risk   9.6   7.1  3 X Average Risk  23.4   11.0        Use the calculated Patient Ratio above and the CHD Risk Table to determine the patient's CHD Risk.        ATP III CLASSIFICATION (LDL):  <100     mg/dL  Optimal  100-129  mg/dL   Near or Above                    Optimal  130-159  mg/dL   Borderline  161-096  mg/dL   High  >045     mg/dL   Very High   TSH     Status: None   Collection Time: 08/09/16  7:07 AM  Result Value Ref Range   TSH 1.030 0.350 - 4.500 uIU/mL    Comment: Performed by a 3rd Generation assay with a functional sensitivity of <=0.01 uIU/mL.    Blood Alcohol level:  Lab Results  Component Value Date   [facility] <5 08/07/2016   ETH <11 07/10/2013    Metabolic Disorder Labs:  No results found for: HGBA1C, MPG No results found for: PROLACTIN Lab Results  Component Value Date   CHOL 150 08/09/2016   TRIG 51 08/09/2016   HDL 69 08/09/2016   CHOLHDL 2.2 08/09/2016   VLDL 10 08/09/2016   LDLCALC 71 08/09/2016    Current Medications: Current Facility-Administered Medications  Medication Dose Route Frequency Provider Last Rate Last Dose  . acetaminophen (TYLENOL) tablet 650 mg  650 mg Oral Q6H PRN Audery Amel, MD      . alum & mag hydroxide-simeth (MAALOX/MYLANTA) 200-200-20 MG/5ML suspension 30 mL  30 mL Oral Q4H PRN Audery Amel, MD      . bacitracin ointment   Topical PRN Khai Torbert B Wynter Grave, MD      . diphenhydrAMINE (BENADRYL) capsule 50 mg  50 mg Oral Q6H PRN Shanna Strength B Moorea Boissonneault, MD       Or  . diphenhydrAMINE (BENADRYL)  injection 50 mg  50 mg Intramuscular Q6H PRN Deniece Rankin B Nelson Julson, MD      . haloperidol (HALDOL) tablet 5 mg  5 mg Oral Q6H PRN Cortavious Nix B Jenniferlynn Saad, MD       Or  . haloperidol lactate (HALDOL) injection 5 mg  5 mg Intramuscular Q6H PRN Alis Sawchuk B Taziyah Iannuzzi, MD      . hydrOXYzine (ATARAX/VISTARIL) tablet 50 mg  50 mg Oral TID PRN Audery Amel, MD      . Influenza vac split quadrivalent PF (FLUARIX) injection 0.5 mL  0.5 mL Intramuscular Tomorrow-1000 Jimena Wieczorek B Laurine Kuyper, MD      . LORazepam (ATIVAN) tablet 2 mg  2 mg Oral Q4H PRN Shari Prows, MD       Or  . LORazepam (ATIVAN) injection 2 mg  2 mg Intramuscular Q4H PRN Jayliah Benett B Katrina Brosh, MD      . magnesium hydroxide (MILK OF MAGNESIA) suspension 30 mL  30 mL Oral Daily PRN Audery Amel, MD      . ondansetron (ZOFRAN) tablet 4 mg  4 mg Oral Q6H PRN Shari Prows, MD   4 mg at 08/08/16 1706  . risperiDONE (RISPERDAL) tablet 3 mg  3 mg Oral QHS Audery Amel, MD   3 mg at 08/08/16 2136  . traZODone (DESYREL) tablet 100 mg  100 mg Oral QHS PRN Audery Amel, MD      . white petrolatum (VASELINE) gel   Topical PRN Audery Amel, MD       PTA Medications: Prescriptions Prior to Admission  Medication Sig Dispense Refill Last Dose  . risperiDONE (RISPERDAL) 3 MG tablet Take 3 mg by mouth at bedtime.   over 1 month at unknown time  . silver sulfADIAZINE (SILVADENE) 1 % cream Apply 1 application  topically daily. (Patient not taking: Reported on 08/07/2016) 50 g 0 Completed Course at Unknown time    Musculoskeletal: Strength & Muscle Tone: within normal limits Gait & Station: normal Patient leans: N/A  Psychiatric Specialty Exam: Physical Exam  Nursing note and vitals reviewed. Constitutional: Barbara George is oriented to person, place, and time. Barbara George appears well-developed and well-nourished.  HENT:  Head: Normocephalic and atraumatic.  Eyes: Conjunctivae and EOM are normal. Pupils are equal, round, and reactive to light.   Neck: Normal range of motion. Neck supple.  Cardiovascular: Normal rate, regular rhythm and normal heart sounds.   Respiratory: Effort normal and breath sounds normal.  GI: Soft. Bowel sounds are normal.  Musculoskeletal: Normal range of motion.  Neurological: Barbara George is alert and oriented to person, place, and time.  Skin: Skin is warm and dry.  Psychiatric: Her speech is normal. Her affect is angry, labile and inappropriate. Barbara George is hyperactive and actively hallucinating. Thought content is paranoid and delusional. Cognition and memory are normal. Barbara George expresses impulsivity and inappropriate judgment.    Review of Systems  Psychiatric/Behavioral: Positive for hallucinations. The patient has insomnia.   All other systems reviewed and are negative.   Blood pressure 122/79, pulse (!) 111, temperature 98.6 F (37 C), resp. rate 18, height 5\' 8"  (1.727 m), weight 66.7 kg (147 lb), SpO2 96 %.Body mass index is 22.35 kg/m.  See SRA.                                                  Sleep:  Number of Hours: 6.3    Treatment Plan Summary: Daily contact with patient to assess and evaluate symptoms and progress in treatment and Medication management   Barbara George is a 21 year old female with a history of bipolar disorder admitted in a psychotic, manic episode.  1. Mood and psychosis. The patient has been maintained on injections of Abilify maintena. Her last injection was given recently. Barbara George was started on Risperdal in the emergency room. We will increase dose to 3 mg twice daily. We will start Depakote for mood stabilization.   2. Agitation. There are orders for Haldol, Ativan and Benadryl if needed.  3. Insomnia. Will give Restoril.  4. Metabolic syndrome monitoring. Lipid panel, TSH, hemoglobin A1c are pending.  5. EKG. Pending.  6. Disposition. Barbara George will be discharged to home with family. Barbara George will [facility] up with [facility].  Observation  Level/Precautions:  15 minute checks  Laboratory:  CBC Chemistry Profile UDS UA  Psychotherapy:    Medications:    Consultations:    Discharge Concerns:    Estimated LOS:  Other:     Physician Treatment Plan for Primary Diagnosis: Bipolar I disorder, current or most recent episode manic, with psychotic features (HCC) Long Term Goal(s): Improvement in symptoms so as ready for discharge  Short Term Goals: Ability to identify changes in lifestyle to reduce recurrence of condition will improve, Ability to verbalize feelings will improve, Ability to disclose and discuss suicidal ideas, Ability to demonstrate self-control will improve, Ability to identify and develop effective coping behaviors will improve, Ability to maintain clinical measurements within normal limits will improve, Compliance with prescribed medications will improve and Ability to identify triggers associated with substance abuse/mental health issues will improve  Physician Treatment Plan for Secondary Diagnosis: Principal Problem:   Bipolar I disorder, current  or most recent episode manic, with psychotic features (HCC)  Long Term Goal(s): NA  Short Term Goals: NA  I certify that inpatient services furnished can reasonably be expected to improve the patient's condition.    Kristine Linea, MD 3/8/201812:06 PM

## 2016-08-09 NOTE — [facility] Group Notes (Signed)
[facility] Group Notes:  (Nursing/MHT/Case Management/Adjunct)  Date:  08/09/2016  Time:  1:37 AM  Type of Therapy:  Group Therapy  Participation Level:  Minimal  Participation Quality:  Attentive  Affect:  Flat  Cognitive:  Oriented  Insight:  Lacking  Engagement in Group:  Lacking  Modes of Intervention:  Discussion  Summary of Progress/Problems: When staff asked pt if she had a goal. She stated it was to go home. Staff asked pt to elaborate and pt stared at staff until staff asked if she wanted staff to move onto the next person. Pt shook her head yes.   [name] [name] 08/09/2016, 1:37 AM

## 2016-08-09 NOTE — Progress Notes (Deleted)
[facility] MD Progress Note  08/09/2016 5:37 PM Barbara George  MRN:  [id number]  Subjective:   Barbara George is a 21 year old female with history of bipolar disorder admitted floridly manic spite of continuous use of Abilify [place], last injection given on 3/5. She was started on Risperdal in the ER. I added Depakote. She is agitated or child-like at times. She throws things and was found on the floor several times. Please consider switching to Zyprexa? If no improvement.  08/10/2016. Barbara George met with treatment team today. She was hardly cooperating, demanded discharge, and threw a cup of water on the floor. She has been up all night praying, received Haldol and Ativan injection, and was somewhat sedated this morning.  Per nursing: Pt is anxious and agitation. irritable. Pt requires frequent redirection to room. Ativan 2 mg po given at 0300 PRN for anxiety. Pt continues to pace the unit and kick the doors. Haldol 5 mg po given at 0330 PRN. Safety maintained with q 15 min checks. Safety maintained with q 15 min checks.   Principal Problem: Bipolar I disorder, current or most recent episode manic, with psychotic features ([place]) Diagnosis:   Patient Active Problem List   Diagnosis Date Noted  . Bipolar I disorder, current or most recent episode manic, with psychotic features ([place]) [F31.2] 08/08/2016   Total Time spent with patient: 20 minutes  Past Psychiatric History: bipolar disorder.  Past Medical History:  Past Medical History:  Diagnosis Date  . Depression    History reviewed. No pertinent surgical history. Family History: History reviewed. No pertinent family history. Family Psychiatric  History: See H&P. Social History:  History  Alcohol Use No     History  Drug Use No    Social History   Social History  . Marital status: Single    Spouse name: N/A  . Number of children: N/A  . Years of education: N/A   Social History Main Topics  . Smoking status: Never Smoker  .  Smokeless tobacco: Never Used  . Alcohol use No  . Drug use: No  . Sexual activity: Not Asked   Other Topics Concern  . None   Social History Narrative   ** Merged History Encounter **       Additional Social History:    History of alcohol / drug use?: No history of alcohol / drug abuse                    Sleep: Poor  Appetite:  Poor  Current Medications: Current Facility-Administered Medications  Medication Dose Route Frequency Provider Last Rate Last Dose  . acetaminophen (TYLENOL) tablet 650 mg  650 mg Oral Q6H PRN Gonzella Lex, MD      . alum & mag hydroxide-simeth (MAALOX/MYLANTA) 200-200-20 MG/5ML suspension 30 mL  30 mL Oral Q4H PRN Gonzella Lex, MD      . bacitracin ointment   Topical PRN Eletha Culbertson B Helon Wisinski, MD      . diphenhydrAMINE (BENADRYL) capsule 50 mg  50 mg Oral Q6H PRN Bowyn Mercier B Ayeden Gladman, MD       Or  . diphenhydrAMINE (BENADRYL) injection 50 mg  50 mg Intramuscular Q6H PRN Jimmi Sidener B Emilynn Srinivasan, MD      . haloperidol (HALDOL) tablet 5 mg  5 mg Oral Q6H PRN Cope Marte B Emer Onnen, MD       Or  . haloperidol lactate (HALDOL) injection 5 mg  5 mg Intramuscular Q6H PRN Clovis Fredrickson, MD      .  hydrOXYzine (ATARAX/VISTARIL) tablet 50 mg  50 mg Oral TID PRN Gonzella Lex, MD      . Influenza vac split quadrivalent PF (FLUARIX) injection 0.5 mL  0.5 mL Intramuscular Tomorrow-1000 Paitynn Mikus B Milad Bublitz, MD      . LORazepam (ATIVAN) tablet 2 mg  2 mg Oral Q4H PRN Clovis Fredrickson, MD       Or  . LORazepam (ATIVAN) injection 2 mg  2 mg Intramuscular Q4H PRN Vana Arif B Steve Youngberg, MD      . magnesium hydroxide (MILK OF MAGNESIA) suspension 30 mL  30 mL Oral Daily PRN Gonzella Lex, MD      . ondansetron (ZOFRAN) tablet 4 mg  4 mg Oral Q6H PRN Clovis Fredrickson, MD   4 mg at 08/08/16 1706  . risperiDONE (RISPERDAL) tablet 3 mg  3 mg Oral QHS Gonzella Lex, MD   3 mg at 08/08/16 2136  . traZODone (DESYREL) tablet 100 mg  100 mg Oral QHS PRN  Gonzella Lex, MD      . white petrolatum (VASELINE) gel   Topical PRN Gonzella Lex, MD        Lab Results:  Results for orders placed or performed during the hospital encounter of 08/08/16 (from the past 48 hour(s))  Lipid panel     Status: None   Collection Time: 08/09/16  7:07 AM  Result Value Ref Range   Cholesterol 150 0 - 200 mg/dL   Triglycerides 51 <150 mg/dL   HDL 69 >40 mg/dL   Total CHOL/HDL Ratio 2.2 RATIO   VLDL 10 0 - 40 mg/dL   LDL Cholesterol 71 0 - 99 mg/dL    Comment:        Total Cholesterol/HDL:CHD Risk Coronary Heart Disease Risk Table                     Men   Women  1/2 Average Risk   3.4   3.3  Average Risk       5.0   4.4  2 X Average Risk   9.6   7.1  3 X Average Risk  23.4   11.0        Use the calculated Patient Ratio above and the CHD Risk Table to determine the patient's CHD Risk.        ATP III CLASSIFICATION (LDL):  <100     mg/dL   Optimal  100-129  mg/dL   Near or Above                    Optimal  130-159  mg/dL   Borderline  160-189  mg/dL   High  >190     mg/dL   Very High   TSH     Status: None   Collection Time: 08/09/16  7:07 AM  Result Value Ref Range   TSH 1.030 0.350 - 4.500 uIU/mL    Comment: Performed by a 3rd Generation assay with a functional sensitivity of <=0.01 uIU/mL.    Blood Alcohol level:  Lab Results  Component Value Date   [facility] <5 08/07/2016   ETH <11 12/81/1886    Metabolic Disorder Labs: No results found for: HGBA1C, MPG No results found for: PROLACTIN Lab Results  Component Value Date   CHOL 150 08/09/2016   TRIG 51 08/09/2016   HDL 69 08/09/2016   CHOLHDL 2.2 08/09/2016   VLDL 10 08/09/2016   LDLCALC 71 08/09/2016  Physical Findings: AIMS:  , ,  ,  ,    CIWA:    COWS:     Musculoskeletal: Strength & Muscle Tone: within normal limits Gait & Station: normal Patient leans: N/A  Psychiatric Specialty Exam: Physical Exam  Nursing note and vitals reviewed. Psychiatric: Her speech is  normal. Her affect is labile and inappropriate. She is hyperactive and actively hallucinating. Thought content is paranoid and delusional. Cognition and memory are normal. She expresses impulsivity and inappropriate judgment.    Review of Systems  Psychiatric/Behavioral: Positive for hallucinations. The patient has insomnia.   All other systems reviewed and are negative.   Blood pressure 122/79, pulse (!) 111, temperature 98.6 F (37 C), resp. rate 18, height 5' 8" (1.727 m), weight 66.7 kg (147 lb), SpO2 96 %.Body mass index is 22.35 kg/m.  General Appearance: Casual  Eye Contact:  Good  Speech:  Clear and Coherent  Volume:  Normal  Mood:  Angry and Irritable  Affect:  Congruent  Thought Process:  Disorganized and Descriptions of Associations: Tangential  Orientation:  Full (Time, Place, and Person)  Thought Content:  Delusions, Hallucinations: Auditory and Paranoid Ideation  Suicidal Thoughts:  No  Homicidal Thoughts:  No  Memory:  Immediate;   Fair Recent;   Fair Remote;   Fair  Judgement:  Poor  Insight:  Lacking  Psychomotor Activity:  Increased  Concentration:  Concentration: Fair and Attention Span: Fair  Recall:  [employer] of Knowledge:  Fair  Language:  Fair  Akathisia:  No  Handed:  Right  AIMS (if indicated):     Assets:  Communication Skills Desire for Improvement Housing Physical Health Resilience Social Support  ADL's:  Intact  Cognition:  WNL  Sleep:  Number of Hours: 6.3     Treatment Plan Summary: Daily contact with patient to assess and evaluate symptoms and progress in treatment and Medication management   Barbara George is a 21 year old female with a history of bipolar disorder admitted in a psychotic, manic episode.  1. Mood and psychosis. The patient has been maintained on injections of Abilify maintena. Her last injection was given on 08/06/2016. She was started on Risperdal in the emergency room. We increase dose to 3 mg twice daily and started  Depakote for mood stabilization.   2. Agitation. There are orders for Haldol, Ativan and Benadryl if needed.  3. Insomnia. Will increase Restoril to 30 mg.  4. Metabolic syndrome monitoring. Lipid panel, TSH, hemoglobin A1c are normal.   5. EKG. Pending.  6. Disposition. She will be discharged to home with family. She will follow up with [facility] in [facility].  Orson Slick, MD 08/09/2016, 5:37 PM

## 2016-08-09 NOTE — [facility] Group Notes (Signed)
[facility] Group Notes:  (Nursing/MHT/Case Management/Adjunct)  Date:  08/09/2016  Time:  4:41 PM  Type of Therapy:  Psychoeducational Skills  Participation Level:  None  Participation Quality:  Not Appropriate   Affect:  Angry  Cognitive:  Delusional  Insight:  Lacking  Engagement in Group:  Off Topic  Modes of Intervention:  Activity  Summary of Progress/Problems: pt was very rude and inappropriate in group. The first incident occurred when we were using markers. Pt started taking apart the marker and was also trying to take it with her in her scrub pockets. After redirecting her and letting her know she could not have the markers, pt then proceeds to breaks the ruler in half. After this, pt was asked to leave.   Vesna Kable De'Chelle Janece Laidlaw 08/09/2016, 4:41 PM

## 2016-08-10 LAB — HEMOGLOBIN A1C
Hgb A1c MFr Bld: 5.3 % (ref 4.8–5.6)
Mean Plasma Glucose: 105 mg/dL

## 2016-08-10 MED ORDER — ARIPIPRAZOLE ER 400 MG IM SRER
400.0000 mg | INTRAMUSCULAR | Status: DC
  Filled 2016-08-10: qty 400

## 2016-08-10 MED ORDER — TEMAZEPAM 15 MG PO CAPS
30.0000 mg | ORAL_CAPSULE | Freq: Every day | ORAL | Status: DC
Start: 2016-08-10 — End: 2016-08-11
  Filled 2016-08-10: qty 2

## 2016-08-10 NOTE — Tx Team (Signed)
Interdisciplinary Treatment and Diagnostic Plan Update  08/10/2016 Time of Session: 10:30 AM [name] MRN: [id number]  Principal Diagnosis: Bipolar I disorder, current or most recent episode manic, with psychotic features (HCC)  Secondary Diagnoses: Principal Problem:   Bipolar I disorder, current or most recent episode manic, with psychotic features (HCC)   Current Medications:  Current Facility-Administered Medications  Medication Dose Route Frequency Provider Last Rate Last Dose  . acetaminophen (TYLENOL) tablet 650 mg  650 mg Oral Q6H PRN Audery AmelJohn T Clapacs, MD      . alum & mag hydroxide-simeth (MAALOX/MYLANTA) 200-200-20 MG/5ML suspension 30 mL  30 mL Oral Q4H PRN Audery AmelJohn T Clapacs, MD      . bacitracin ointment   Topical PRN Jolanta B Pucilowska, MD      . diphenhydrAMINE (BENADRYL) capsule 50 mg  50 mg Oral Q6H PRN Jolanta B Pucilowska, MD       Or  . diphenhydrAMINE (BENADRYL) injection 50 mg  50 mg Intramuscular Q6H PRN Jolanta B Pucilowska, MD      . haloperidol (HALDOL) tablet 5 mg  5 mg Oral Q6H PRN Shari ProwsJolanta B Pucilowska, MD   5 mg at 08/10/16 0329   Or  . haloperidol lactate (HALDOL) injection 5 mg  5 mg Intramuscular Q6H PRN Jolanta B Pucilowska, MD      . Influenza vac split quadrivalent PF (FLUARIX) injection 0.5 mL  0.5 mL Intramuscular Tomorrow-1000 Jolanta B Pucilowska, MD      . LORazepam (ATIVAN) tablet 2 mg  2 mg Oral Q4H PRN Shari ProwsJolanta B Pucilowska, MD   2 mg at 08/10/16 0301   Or  . LORazepam (ATIVAN) injection 2 mg  2 mg Intramuscular Q4H PRN Jolanta B Pucilowska, MD      . magnesium hydroxide (MILK OF MAGNESIA) suspension 30 mL  30 mL Oral Daily PRN Audery AmelJohn T Clapacs, MD      . ondansetron (ZOFRAN) tablet 4 mg  4 mg Oral Q6H PRN Shari ProwsJolanta B Pucilowska, MD   4 mg at 08/08/16 1706  . risperiDONE (RISPERDAL) tablet 3 mg  3 mg Oral QHS Audery AmelJohn T Clapacs, MD   3 mg at 08/09/16 2116  . temazepam (RESTORIL) capsule 15 mg  15 mg Oral QHS Shari ProwsJolanta B Pucilowska, MD   15 mg at  08/09/16 2116  . traZODone (DESYREL) tablet 100 mg  100 mg Oral QHS PRN Audery AmelJohn T Clapacs, MD      . white petrolatum (VASELINE) gel   Topical PRN Audery AmelJohn T Clapacs, MD       PTA Medications: Prescriptions Prior to Admission  Medication Sig Dispense Refill Last Dose  . risperiDONE (RISPERDAL) 3 MG tablet Take 3 mg by mouth at bedtime.   over 1 month at unknown time  . silver sulfADIAZINE (SILVADENE) 1 % cream Apply 1 application topically daily. (Patient not taking: Reported on 08/07/2016) 50 g 0 Completed Course at Unknown time    Patient Stressors: Marital or family conflict Occupational concerns Traumatic event  Patient Strengths: Average or above average intelligence Communication skills Supportive family/friends  Treatment Modalities: Medication Management, Group therapy, Case management,  1 to 1 session with clinician, Psychoeducation, Recreational therapy.   Physician Treatment Plan for Primary Diagnosis: Bipolar I disorder, current or most recent episode manic, with psychotic features (HCC) Long Term Goal(s): Improvement in symptoms so as ready for discharge NA   Short Term Goals: Ability to identify changes in lifestyle to reduce recurrence of condition will improve Ability to verbalize feelings will improve Ability  to disclose and discuss suicidal ideas Ability to demonstrate self-control will improve Ability to identify and develop effective coping behaviors will improve Ability to maintain clinical measurements within normal limits will improve Compliance with prescribed medications will improve Ability to identify triggers associated with substance abuse/mental health issues will improve NA  Medication Management: Evaluate patient's response, side effects, and tolerance of medication regimen.  Therapeutic Interventions: 1 to 1 sessions, Unit Group sessions and Medication administration.  Evaluation of Outcomes: Progressing  Physician Treatment Plan for Secondary  Diagnosis: Principal Problem:   Bipolar I disorder, current or most recent episode manic, with psychotic features (HCC)  Long Term Goal(s): Improvement in symptoms so as ready for discharge NA   Short Term Goals: Ability to identify changes in lifestyle to reduce recurrence of condition will improve Ability to verbalize feelings will improve Ability to disclose and discuss suicidal ideas Ability to demonstrate self-control will improve Ability to identify and develop effective coping behaviors will improve Ability to maintain clinical measurements within normal limits will improve Compliance with prescribed medications will improve Ability to identify triggers associated with substance abuse/mental health issues will improve NA     Medication Management: Evaluate patient's response, side effects, and tolerance of medication regimen.  Therapeutic Interventions: 1 to 1 sessions, Unit Group sessions and Medication administration.  Evaluation of Outcomes: Progressing   RN Treatment Plan for Primary Diagnosis: Bipolar I disorder, current or most recent episode manic, with psychotic features (HCC) Long Term Goal(s): Knowledge of disease and therapeutic regimen to maintain health will improve  Short Term Goals: Ability to demonstrate self-control and Ability to identify and develop effective coping behaviors will improve  Medication Management: RN will administer medications as ordered by provider, will assess and evaluate patient's response and provide education to patient for prescribed medication. RN will report any adverse and/or side effects to prescribing provider.  Therapeutic Interventions: 1 on 1 counseling sessions, Psychoeducation, Medication administration, Evaluate responses to treatment, Monitor vital signs and CBGs as ordered, Perform/monitor CIWA, COWS, AIMS and Fall Risk screenings as ordered, Perform wound care treatments as ordered.  Evaluation of Outcomes:  Progressing   LCSW Treatment Plan for Primary Diagnosis: Bipolar I disorder, current or most recent episode manic, with psychotic features (HCC) Long Term Goal(s): Safe transition to appropriate next level of care at discharge, Engage patient in therapeutic group addressing interpersonal concerns.  Short Term Goals: Engage patient in aftercare planning with referrals and resources, Increase ability to appropriately verbalize feelings and Increase skills for wellness and recovery  Therapeutic Interventions: Assess for all discharge needs, 1 to 1 time with Social worker, Explore available resources and support systems, Assess for adequacy in community support network, Educate family and significant other(s) on suicide prevention, Complete Psychosocial Assessment, Interpersonal group therapy.  Evaluation of Outcomes: Progressing   Progress in Treatment: Attending groups: Yes. Participating in groups: Yes. Taking medication as prescribed: Yes. Toleration medication: Yes. Family/Significant other contact made: Yes, individual(s) contacted:  CSW spoke with pt's father. Patient understands diagnosis: Yes. Discussing patient identified problems/goals with staff: Yes. Medical problems stabilized or resolved: Yes. Denies suicidal/homicidal ideation: Yes. Issues/concerns per patient self-inventory: No.  New problem(s) identified: No, Describe:  None identified.  New Short Term/Long Term Goal(s): Pt's goal is to discharge home and feel better.  Discharge Plan or Barriers: Pt will return home with family and follow-up with [facility] in [place].  Reason for Continuation of Hospitalization: Mania Suicidal ideation  Estimated Length of Stay: 3-5 days  Attendees: Patient: Barbara George  08/10/2016 11:13 AM  Physician: Dr. Kristine Linea, MD 08/10/2016 11:13 AM  Nursing: Leonia Reader, BSN, RN 08/10/2016 11:13 AM  RN Care Manager: 08/10/2016 11:13 AM  Social Worker: Hampton Abbot, MSW, LCSW-A 08/10/2016 11:13 AM  Recreational Therapist: Princella Ion, LRT, CTRS 08/10/2016 11:13 AM  Other:  08/10/2016 11:13 AM  Other:  08/10/2016 11:13 AM  Other: 08/10/2016 11:13 AM    Scribe for Treatment Team: Lynden Oxford, LCSWA 08/10/2016 11:13 AM

## 2016-08-10 NOTE — Progress Notes (Signed)
Recreation Therapy Notes  (Late Entry for 03.08.18)  Date: 03.08.18 Time: 9:30 am Location: Craft Room  Group Topic: Leisure Education  Goal Area(s) Addresses:  Patient will identify things they are grateful for. Patient will identify how being grateful can influence decision-making.  Behavioral Response: Did not attend  Intervention: Grateful Wheel  Activity: Patients were given an I Am Grateful For worksheet and were instructed to write things they are grateful for under each category.  Education: LRT educated patients on leisure.  Education Outcome: Acknowledges education/In group clarification offered/Needs additional education.   Clinical Observations/Feedback: Patient did not attend group.  Jacquelynn CreeGreene,Lela Murfin M, LRT/CTRS 08/10/2016 8:03 AM

## 2016-08-10 NOTE — Plan of Care (Signed)
Problem: Safety: Goal: Ability to remain free from injury will improve Outcome: Progressing Q 15 minute checks, non slip socks, affects of medications assessed.

## 2016-08-10 NOTE — Progress Notes (Addendum)
[facility] MD Progress Note  08/10/2016 12:06 PM Barbara George  MRN:  [id number]  Subjective:   Barbara George is a 21 year old female with history of bipolar disorder admitted floridly manic spite of continuous use of Abilify [place]et[place], last injection given on 3/5. She was started on Risperdal in the ER. I added Depakote. She is agitated or child-like at times. She throws things and was found on the floor several times. Please consider switching to Zyprexa? If no improvement.  08/10/2016. Barbara George met with treatment team today. She was hardly cooperating, demanded discharge, and threw a cup of water on the floor. She has been up all night praying, received Haldol and Ativan injection, and was somewhat sedated this morning.  Per nursing: Pt is anxious and agitation. irritable. Pt requires frequent redirection to room. Ativan 2 mg po given at 0300 PRN for anxiety. Pt continues to pace the unit and kick the doors. Haldol 5 mg po given at 0330 PRN. Safety maintained with q 15 min checks. Safety maintained with q 15 min checks.   Principal Problem: Bipolar I disorder, current or most recent episode manic, with psychotic features ([place]) Diagnosis:   Patient Active Problem List   Diagnosis Date Noted  . Bipolar I disorder, current or most recent episode manic, with psychotic features ([place]) [F31.2] 08/08/2016   Total Time spent with patient: 20 minutes  Past Psychiatric History: bipolar disorder.  Past Medical History:  Past Medical History:  Diagnosis Date  . Depression    History reviewed. No pertinent surgical history. Family History: History reviewed. No pertinent family history. Family Psychiatric  History: See H&P. Social History:  History  Alcohol Use No     History  Drug Use No    Social History   Social History  . Marital status: Single    Spouse name: N/A  . Number of children: N/A  . Years of education: N/A   Social History Main Topics  . Smoking status: Never Smoker  .  Smokeless tobacco: Never Used  . Alcohol use No  . Drug use: No  . Sexual activity: Not Asked   Other Topics Concern  . None   Social History Narrative   ** Merged History Encounter **       Additional Social History:    History of alcohol / drug use?: No history of alcohol / drug abuse                    Sleep: Poor  Appetite:  Poor  Current Medications: Current Facility-Administered Medications  Medication Dose Route Frequency Provider Last Rate Last Dose  . acetaminophen (TYLENOL) tablet 650 mg  650 mg Oral Q6H PRN Gonzella Lex, MD      . alum & mag hydroxide-simeth (MAALOX/MYLANTA) 200-200-20 MG/5ML suspension 30 mL  30 mL Oral Q4H PRN Gonzella Lex, MD      . bacitracin ointment   Topical PRN Jolanta B Pucilowska, MD      . diphenhydrAMINE (BENADRYL) capsule 50 mg  50 mg Oral Q6H PRN Jolanta B Pucilowska, MD       Or  . diphenhydrAMINE (BENADRYL) injection 50 mg  50 mg Intramuscular Q6H PRN Jolanta B Pucilowska, MD      . haloperidol (HALDOL) tablet 5 mg  5 mg Oral Q6H PRN Jolanta B Pucilowska, MD   5 mg at 08/10/16 0329   Or  . haloperidol lactate (HALDOL) injection 5 mg  5 mg Intramuscular Q6H PRN Jolanta B Pucilowska,  MD      . Influenza vac split quadrivalent PF (FLUARIX) injection 0.5 mL  0.5 mL Intramuscular Tomorrow-1000 Jolanta B Pucilowska, MD      . LORazepam (ATIVAN) tablet 2 mg  2 mg Oral Q4H PRN Clovis Fredrickson, MD   2 mg at 08/10/16 0301   Or  . LORazepam (ATIVAN) injection 2 mg  2 mg Intramuscular Q4H PRN Jolanta B Pucilowska, MD      . magnesium hydroxide (MILK OF MAGNESIA) suspension 30 mL  30 mL Oral Daily PRN Gonzella Lex, MD      . ondansetron (ZOFRAN) tablet 4 mg  4 mg Oral Q6H PRN Clovis Fredrickson, MD   4 mg at 08/08/16 1706  . risperiDONE (RISPERDAL) tablet 3 mg  3 mg Oral QHS Gonzella Lex, MD   3 mg at 08/09/16 2116  . temazepam (RESTORIL) capsule 15 mg  15 mg Oral QHS Clovis Fredrickson, MD   15 mg at 08/09/16 2116  .  traZODone (DESYREL) tablet 100 mg  100 mg Oral QHS PRN Gonzella Lex, MD      . white petrolatum (VASELINE) gel   Topical PRN Gonzella Lex, MD        Lab Results:  Results for orders placed or performed during the hospital encounter of 08/08/16 (from the past 48 hour(s))  Hemoglobin A1c     Status: None   Collection Time: 08/09/16  7:07 AM  Result Value Ref Range   Hgb A1c MFr Bld 5.3 4.8 - 5.6 %    Comment: (NOTE)         Pre-diabetes: 5.7 - 6.4         Diabetes: >6.4         Glycemic control for adults with diabetes: <7.0    Mean Plasma Glucose 105 mg/dL    Comment: (NOTE) Performed At: [facility] [place], [place] [id number] Lindon Romp MD [id number]   Lipid panel     Status: None   Collection Time: 08/09/16  7:07 AM  Result Value Ref Range   Cholesterol 150 0 - 200 mg/dL   Triglycerides 51 <150 mg/dL   HDL 69 >40 mg/dL   Total CHOL/HDL Ratio 2.2 RATIO   VLDL 10 0 - 40 mg/dL   LDL Cholesterol 71 0 - 99 mg/dL    Comment:        Total Cholesterol/HDL:CHD Risk Coronary Heart Disease Risk Table                     Men   Women  1/2 Average Risk   3.4   3.3  Average Risk       5.0   4.4  2 X Average Risk   9.6   7.1  3 X Average Risk  23.4   11.0        Use the calculated Patient Ratio above and the CHD Risk Table to determine the patient's CHD Risk.        ATP III CLASSIFICATION (LDL):  <100     mg/dL   Optimal  100-129  mg/dL   Near or Above                    Optimal  130-159  mg/dL   Borderline  160-189  mg/dL   High  >190     mg/dL   Very High   TSH     Status: None  Collection Time: 08/09/16  7:07 AM  Result Value Ref Range   TSH 1.030 0.350 - 4.500 uIU/mL    Comment: Performed by a 3rd Generation assay with a functional sensitivity of <=0.01 uIU/mL.    Blood Alcohol level:  Lab Results  Component Value Date   [facility] <5 08/07/2016   ETH <11 76/73/4193    Metabolic Disorder Labs: Lab Results  Component Value Date    HGBA1C 5.3 08/09/2016   MPG 105 08/09/2016   No results found for: PROLACTIN Lab Results  Component Value Date   CHOL 150 08/09/2016   TRIG 51 08/09/2016   HDL 69 08/09/2016   CHOLHDL 2.2 08/09/2016   VLDL 10 08/09/2016   LDLCALC 71 08/09/2016    Physical Findings: AIMS:  , ,  ,  ,    CIWA:    COWS:     Musculoskeletal: Strength & Muscle Tone: within normal limits Gait & Station: normal Patient leans: N/A  Psychiatric Specialty Exam: Physical Exam  Nursing note and vitals reviewed. Psychiatric: Her speech is normal. Her affect is labile and inappropriate. She is hyperactive and actively hallucinating. Thought content is paranoid and delusional. Cognition and memory are normal. She expresses impulsivity and inappropriate judgment.    Review of Systems  Psychiatric/Behavioral: Positive for hallucinations. The patient has insomnia.   All other systems reviewed and are negative.   Blood pressure 117/69, pulse 99, temperature 98.2 F (36.8 C), temperature source Oral, resp. rate 18, height 5' 8"  (1.727 m), weight 66.7 kg (147 lb), SpO2 96 %.Body mass index is 22.35 kg/m.  General Appearance: Casual  Eye Contact:  Good  Speech:  Clear and Coherent  Volume:  Normal  Mood:  Angry and Irritable  Affect:  Congruent  Thought Process:  Disorganized and Descriptions of Associations: Tangential  Orientation:  Full (Time, Place, and Person)  Thought Content:  Delusions, Hallucinations: Auditory and Paranoid Ideation  Suicidal Thoughts:  No  Homicidal Thoughts:  No  Memory:  Immediate;   Fair Recent;   Fair Remote;   Fair  Judgement:  Poor  Insight:  Lacking  Psychomotor Activity:  Increased  Concentration:  Concentration: Fair and Attention Span: Fair  Recall:  [employer] of Knowledge:  Fair  Language:  Fair  Akathisia:  No  Handed:  Right  AIMS (if indicated):     Assets:  Communication Skills Desire for Improvement Housing Physical Health Resilience Social Support   ADL's:  Intact  Cognition:  WNL  Sleep:  Number of Hours: 3     Treatment Plan Summary: Daily contact with patient to assess and evaluate symptoms and progress in treatment and Medication management   Ms. George is a 21 year old female with a history of bipolar disorder admitted in a psychotic, manic episode.  1. Mood and psychosis. The patient has been maintained on injections of Abilify maintena. Her last injection was given on 08/06/2016. She was started on Risperdal in the emergency room. We increase dose to 3 mg nightly and started Depakote for mood stabilization.   2. Agitation. There are orders for Haldol, Ativan and Benadryl if needed.  3. Insomnia. Will increase Restoril to 30 mg.  4. Metabolic syndrome monitoring. Lipid panel, TSH, hemoglobin A1c are normal.   5. EKG. Pending.  6. Disposition. She will be discharged to home with family. She will follow up with [facility].  Orson Slick, MD 08/10/2016, 12:06 PM

## 2016-08-10 NOTE — Progress Notes (Signed)
Post fall vitals: Sitting R 16  T 98.8 SPO2 100% RA Hr: 100 BP 112/76  Standing HR 103 BP 100/62

## 2016-08-10 NOTE — Progress Notes (Signed)
Pt is anxious and agitation. irritable. Pt requires frequent redirection to room. Ativan 2 mg po given at 0300 PRN for anxiety. Pt continues to pace the unit and kick the doors. Haldol 5 mg po given at 0330 PRN. Safety maintained with q 15 min checks. Safety maintained with q 15 min checks.

## 2016-08-10 NOTE — Plan of Care (Signed)
Problem: Coping: Goal: Ability to verbalize frustrations and anger appropriately will improve Outcome: Not Met (add Reason) Patient remains anxious. Needy and intrusive. Walking up/down the halls. Requires frequent redirections. PRNs given for agitation. Med adm with education. Safety maintained w/ q 15 min checks.

## 2016-08-10 NOTE — Progress Notes (Signed)
Demanding and threatening behaviors. States she wants to "speak to Child psychotherapistsocial worker and will not be held responsible for breaking windows if we do not let her" speak to the Child psychotherapistsocial worker. Counseled on inappropriate behaviors and redirected. SW called to come at her convenience.   No apparent residuals from earlier fall at this time, seen doing cartwheels in the courtyard. Redirected for safety.

## 2016-08-10 NOTE — [facility] Group Notes (Signed)
[facility] LCSW Group Therapy Note  Date/Time: 08/10/16, 1300  Type of Therapy and Topic:  Group Therapy:  Feelings around Relapse and Recovery  Participation Level:  Active   Mood: pleasant  Description of Group:    Patients in this group will discuss emotions they experience before and after a relapse. They will process how experiencing these feelings, or avoidance of experiencing them, relates to having a relapse. Facilitator will guide patients to explore emotions they have related to recovery. Patients will be encouraged to process which emotions are more powerful. They will be guided to discuss the emotional reaction significant others in their lives may have to patients' relapse or recovery. Patients will be assisted in exploring ways to respond to the emotions of others without this contributing to a relapse.  Therapeutic Goals: 1. Patient will identify two or more emotions that lead to relapse for them:  2. Patient will identify two emotions that result when they relapse:  3. Patient will identify two emotions related to recovery:  4. Patient will demonstrate ability to communicate their needs through discussion and/or role plays.   Summary of Patient Progress: Pt shared that it is hard for her to manage the feeling that "people have lost confidence in me and no longer believe in me."  Pt made several contributions to the discussion overall that were appropriate.  During discussion about more positive ways to manage negative feelings, pt shared that she has to be careful about people she choses to share her feelings with because some people tell what she has said to everyone.     Therapeutic Modalities:   Cognitive Behavioral Therapy Solution-Focused Therapy Assertiveness Training Relapse Prevention Therapy  Daleen SquibbGreg Jabri Blancett, LCSW

## 2016-08-10 NOTE — Progress Notes (Signed)
Recreation Therapy Notes  At approximately 2:3o pm, LRT spoke with nursing regarding patient's assessment. Per nursing, patient is not appropriate for assessment at this time.   Jacquelynn CreeGreene,Lilybelle Mayeda M, LRT/CTRS 08/10/2016 4:46 PM

## 2016-08-10 NOTE — Progress Notes (Signed)
21 year old female with witnessed fall this afternoon. Reportedly called for treatment team and MHT witnessed and assisted to ground when patient fell. Patient reports "feeling sleepy." Vital signs and neuro check done. Patient did not want to notify family. Dr. Jennet MaduroPucilowska notified of fall. No new orders. All appropriate documentation completed. Will reassess vitals at 1300 and Q4 hours x24 hours per protocol. At this time patient is alert and oriented, denies sleepiness, able to ambulate on unit without assistance. Safety and safe ambulation reinforced. Q15 minute checks in place. Will continue to monitor.

## 2016-08-10 NOTE — Progress Notes (Signed)
Vital signs reassessed. See flow sheet. Continued complaints of "feeling sleepy" but no other concerns from patient.

## 2016-08-10 NOTE — Social Work (Signed)
CSW met with patient at the request of RN and patient. Pt stated that she is ready to go home and be with her family. She stated that she is feeling much better and does not know why she has to wait until Monday to discuss discharge plans. CSW reassured patient that Dr. Bary Leriche will meet with her Monday to discuss reasons for keeping her over the weekend. CSW reviewed coping mechanisms with patient in order for her to demonstrate self-control. Pt expressed understanding and ensured CSW that she will remain compliant with orders from staff.   Glorious Peach, MSW, LCSW-A 08/10/2016, 3:50PM

## 2016-08-11 MED ORDER — OLANZAPINE 5 MG PO TBDP
10.0000 mg | ORAL_TABLET | Freq: Every day | ORAL | Status: DC
  Administered 2016-08-11: 10 mg via ORAL
  Filled 2016-08-11: qty 2

## 2016-08-11 MED ORDER — LORAZEPAM 2 MG PO TABS
2.0000 mg | ORAL_TABLET | Freq: Every day | ORAL | Status: DC
  Administered 2016-08-11: 2 mg via ORAL
  Filled 2016-08-11: qty 1

## 2016-08-11 NOTE — [facility] Group Notes (Signed)
[facility] LCSW Group Therapy  08/11/2016 3:22 PM  Type of Therapy:  Group Therapy  Participation Level:  Patient did not attend group on this date.   Summary of Progress/Problems: Goal Setting: The objective is to set goals as they relate to the crisis in which they were admitted. Patients will be using SMART goal modalities to set measurable goals. Characteristics of realistic goals will be discussed and patients will be assisted in setting and processing how one will reach their goal. Facilitator will also assist patients in applying interventions and coping skills learned in psycho-education groups to the SMART goal and process how one will achieve defined goal.  Marthe Dant G. Garnette CzechSampson MSW, LCSWA 08/11/2016, 3:25 PM

## 2016-08-11 NOTE — Progress Notes (Signed)
[facility] MD Progress Note  08/11/2016 10:32 AM Barbara George  MRN:  [id number]  Subjective:   Barbara George is a 21 year old female with history of bipolar disorder admitted floridly manic spite of continuous use of Abilify mainetna, last injection given on 3/5. She was started on Risperdal in the ER. I added Depakote. She is agitated or child-like at times. She throws things and was found on the floor several times. Please consider switching to Zyprexa? If no improvement.  08/10/2016. Barbara George met with treatment team today. She was hardly cooperating, demanded discharge, and threw a cup of water on the floor. She has been up all night praying, received Haldol and Ativan injection, and was somewhat sedated this morning.  3/10 Bizarre this morning. Patient is saying she needs to be discharged if I don't  I will be held responsible if she dies. Constantly at the nurses station knocking on the door asking for things. Appears hyper religious. Tells me she is still hearing voices but wouldn't elaborate. She couldn't tell me whether or not she was suicidal. She tells me she is having thoughts about hurting others but not here in the unit. The patient will only answered the questions with gestures.  She refused Risperdal last night. She took prn Haldol this morning  Per nursing: D: Pt denies SI/HI/AVH. Pt is cooperative. Pt stated appears less anxious, or restless and she is interacting with peers and staff appropriately.  A: Pt was offered support and encouragement. Pt was given scheduled medications. Pt was encouraged to attend groups. Q 15 minute checks were done for safety.  R:Pt attends groups and interacts well with peers and staff. Pt is taking medication. Pt has no complaints.Pt receptive to treatment and safety maintained on unit.  Demanding and threatening behaviors. States she wants to "speak to Education officer, museum and will not be held responsible for breaking windows if we do not let her" speak to  the Education officer, museum. Counseled on inappropriate behaviors and redirected. SW called to come at her convenience.   No apparent residuals from earlier fall at this time, seen doing cartwheels in the courtyard. Redirected for safety.   Principal Problem: Bipolar I disorder, current or most recent episode manic, with psychotic features ([place]) Diagnosis:   Patient Active Problem List   Diagnosis Date Noted  . Bipolar I disorder, current or most recent episode manic, with psychotic features ([place]) [F31.2] 08/08/2016   Total Time spent with patient: 20 minutes  Past Psychiatric History: bipolar disorder.  Past Medical History:  Past Medical History:  Diagnosis Date  . Depression    History reviewed. No pertinent surgical history. Family History: History reviewed. No pertinent family history. Family Psychiatric  History: See H&P. Social History:  History  Alcohol Use No     History  Drug Use No    Social History   Social History  . Marital status: Single    Spouse name: N/A  . Number of children: N/A  . Years of education: N/A   Social History Main Topics  . Smoking status: Never Smoker  . Smokeless tobacco: Never Used  . Alcohol use No  . Drug use: No  . Sexual activity: Not Asked   Other Topics Concern  . None   Social History Narrative   ** Merged History Encounter **       Additional Social History:    History of alcohol / drug use?: No history of alcohol / drug abuse  Sleep: Poor  Appetite:  Poor  Current Medications: Current Facility-Administered Medications  Medication Dose Route Frequency Provider Last Rate Last Dose  . acetaminophen (TYLENOL) tablet 650 mg  650 mg Oral Q6H PRN Gonzella Lex, MD   650 mg at 08/11/16 0626  . alum & mag hydroxide-simeth (MAALOX/MYLANTA) 200-200-20 MG/5ML suspension 30 mL  30 mL Oral Q4H PRN Gonzella Lex, MD      . Derrill Memo ON 09/03/2016] ARIPiprazole ER SRER 400 mg  400 mg Intramuscular Q28 days  Jolanta B Pucilowska, MD      . bacitracin ointment   Topical PRN Jolanta B Pucilowska, MD      . diphenhydrAMINE (BENADRYL) capsule 50 mg  50 mg Oral Q6H PRN Jolanta B Pucilowska, MD       Or  . diphenhydrAMINE (BENADRYL) injection 50 mg  50 mg Intramuscular Q6H PRN Jolanta B Pucilowska, MD      . haloperidol (HALDOL) tablet 5 mg  5 mg Oral Q6H PRN Clovis Fredrickson, MD   5 mg at 08/11/16 0849   Or  . haloperidol lactate (HALDOL) injection 5 mg  5 mg Intramuscular Q6H PRN Jolanta B Pucilowska, MD      . Influenza vac split quadrivalent PF (FLUARIX) injection 0.5 mL  0.5 mL Intramuscular Tomorrow-1000 Jolanta B Pucilowska, MD      . LORazepam (ATIVAN) tablet 2 mg  2 mg Oral Q4H PRN Clovis Fredrickson, MD   2 mg at 08/10/16 0301   Or  . LORazepam (ATIVAN) injection 2 mg  2 mg Intramuscular Q4H PRN Jolanta B Pucilowska, MD      . LORazepam (ATIVAN) tablet 2 mg  2 mg Oral QHS Hildred Priest, MD      . magnesium hydroxide (MILK OF MAGNESIA) suspension 30 mL  30 mL Oral Daily PRN Gonzella Lex, MD      . OLANZapine zydis (ZYPREXA) disintegrating tablet 10 mg  10 mg Oral QHS Hildred Priest, MD      . ondansetron (ZOFRAN) tablet 4 mg  4 mg Oral Q6H PRN Clovis Fredrickson, MD   4 mg at 08/11/16 0851  . white petrolatum (VASELINE) gel   Topical PRN Gonzella Lex, MD        Lab Results:  No results found for this or any previous visit (from the past 48 hour(s)).  Blood Alcohol level:  Lab Results  Component Value Date   [facility] <5 08/07/2016   ETH <11 16/03/9603    Metabolic Disorder Labs: Lab Results  Component Value Date   HGBA1C 5.3 08/09/2016   MPG 105 08/09/2016   No results found for: PROLACTIN Lab Results  Component Value Date   CHOL 150 08/09/2016   TRIG 51 08/09/2016   HDL 69 08/09/2016   CHOLHDL 2.2 08/09/2016   VLDL 10 08/09/2016   LDLCALC 71 08/09/2016    Physical Findings: AIMS:  , ,  ,  ,    CIWA:    COWS:      Musculoskeletal: Strength & Muscle Tone: within normal limits Gait & Station: normal Patient leans: N/A  Psychiatric Specialty Exam: Physical Exam  Nursing note and vitals reviewed. Psychiatric: Her speech is normal. Her affect is labile and inappropriate. She is hyperactive and actively hallucinating. Thought content is paranoid and delusional. Cognition and memory are normal. She expresses impulsivity and inappropriate judgment.    Review of Systems  Psychiatric/Behavioral: Positive for hallucinations. The patient has insomnia.   All other systems reviewed and  are negative.   Blood pressure 111/76, pulse (!) 105, temperature 98.5 F (36.9 C), temperature source Oral, resp. rate 18, height 5' 8"  (1.727 m), weight 66.7 kg (147 lb), SpO2 96 %.Body mass index is 22.35 kg/m.  General Appearance: Casual  Eye Contact:  Good  Speech:  Clear and Coherent  Volume:  Normal  Mood:  Angry and Irritable  Affect:  Congruent  Thought Process:  Disorganized and Descriptions of Associations: Tangential  Orientation:  Full (Time, Place, and Person)  Thought Content:  Delusions, Hallucinations: Auditory and Paranoid Ideation  Suicidal Thoughts:  No  Homicidal Thoughts:  No  Memory:  Immediate;   Fair Recent;   Fair Remote;   Fair  Judgement:  Poor  Insight:  Lacking  Psychomotor Activity:  Increased  Concentration:  Concentration: Fair and Attention Span: Fair  Recall:  [employer] of Knowledge:  Fair  Language:  Fair  Akathisia:  No  Handed:  Right  AIMS (if indicated):     Assets:  Communication Skills Desire for Improvement Housing Physical Health Resilience Social Support  ADL's:  Intact  Cognition:  WNL  Sleep:  Number of Hours: 7.5     Treatment Plan Summary: Daily contact with patient to assess and evaluate symptoms and progress in treatment and Medication management   Barbara George is a 21 year old female with a history of bipolar disorder admitted in a psychotic, manic  episode.  1. Mood and psychosis. The patient has been maintained on injections of Abilify maintena. Her last injection was given on 08/06/2016. She was started on Risperdal in the emergency room. We increase dose to 3 mg nightly and started Depakote for mood stabilization.   2. Agitation. There are orders for Haldol, Ativan and Benadryl if needed.  3. Insomnia. Will increase Restoril to 30 mg.  4. Metabolic syndrome monitoring. Lipid panel, TSH, hemoglobin A1c are normal.   5. EKG. Pending.  6. Disposition. She will be discharged to home with family. She will follow up with [facility] in [facility].  3/10 per nurse's patient is not improving. It looks that she has been on and off refusing medications. She complains of nausea and has been taking Zofran.  I will start her on olanzapine zydis  10 mg at bedtime. I will go ahead and discontinue Risperdal. Plan to t titrate up on olanzapine.  Continue Haldol, Benadryl and Ativan prn  Hildred Priest, MD 08/11/2016, 10:32 AM

## 2016-08-11 NOTE — Plan of Care (Signed)
Problem: Coping: Goal: Ability to verbalize frustrations and anger appropriately will improve Outcome: Not Progressing Patient unable to verbalize frustrations, instead she lay on floor, acted bizarre, aloof, not easy to redirect.

## 2016-08-11 NOTE — Progress Notes (Signed)
D: Pt denies SI/HI/AVH. Pt is cooperative. Pt stated appears less anxious, or restless and she is interacting with peers and staff appropriately.  A: Pt was offered support and encouragement. Pt was given scheduled medications. Pt was encouraged to attend groups. Q 15 minute checks were done for safety.  R:Pt attends groups and interacts well with peers and staff. Pt is taking medication. Pt has no complaints.Pt receptive to treatment and safety maintained on unit.

## 2016-08-11 NOTE — Progress Notes (Signed)
Patient observed acting bizarre(laying on the floors, wearing a towel on head, pacing slowly,hyperreligious)etc. Patient was constantly at the nursing station with different demands. She stated that she felt bizarre. "I feel bizarre." When asked how so, she laughed inappropriately. Haldol 5mg  PO administered at 0849. Medication slightly effective as patient remained redirectable. Reassurance provided. Verbal contacts maintained. Patient encouraged to sit in the back hall room due to disruptive/bizarre behaviors.

## 2016-08-11 NOTE — [facility] Group Notes (Signed)
[facility] Group Notes:  (Nursing/MHT/Case Management/Adjunct)  Date:  08/11/2016  Time:  2:42 AM  Type of Therapy:  Psychoeducational Skills  Participation Level:  Active  Participation Quality:  Appropriate and Sharing  Affect:  Appropriate  Cognitive:  Appropriate  Insight:  Appropriate  Engagement in Group:  Engaged  Modes of Intervention:  Discussion, Socialization and Support  Summary of Progress/Problems:  Chancy MilroyLaquanda Y Datha Kissinger 08/11/2016, 2:42 AM

## 2016-08-12 MED ORDER — DIPHENHYDRAMINE HCL 50 MG/ML IJ SOLN: 50.0000 mg | Freq: Three times a day (TID) | INTRAMUSCULAR | Status: DC | PRN

## 2016-08-12 MED ORDER — LORAZEPAM 2 MG PO TABS
2.0000 mg | ORAL_TABLET | ORAL | Status: DC | PRN
  Administered 2016-08-13: 2 mg via ORAL
  Filled 2016-08-12: qty 1

## 2016-08-12 MED ORDER — LORAZEPAM 2 MG/ML IJ SOLN: 2.0000 mg | INTRAMUSCULAR | Status: DC | PRN

## 2016-08-12 MED ORDER — DIVALPROEX SODIUM 500 MG PO DR TAB
500.0000 mg | DELAYED_RELEASE_TABLET | Freq: Three times a day (TID) | ORAL | Status: DC
  Administered 2016-08-12 – 2016-08-13 (×2): 500 mg via ORAL
  Filled 2016-08-12 (×2): qty 1

## 2016-08-12 MED ORDER — LORAZEPAM 2 MG PO TABS
2.0000 mg | ORAL_TABLET | Freq: Three times a day (TID) | ORAL | Status: DC
  Administered 2016-08-12 – 2016-08-13 (×2): 2 mg via ORAL
  Filled 2016-08-12 (×2): qty 1

## 2016-08-12 MED ORDER — HALOPERIDOL LACTATE 5 MG/ML IJ SOLN
5.0000 mg | Freq: Three times a day (TID) | INTRAMUSCULAR | Status: DC | PRN
Start: 2016-08-12 — End: 2016-08-13

## 2016-08-12 MED ORDER — HALOPERIDOL 5 MG PO TABS
5.0000 mg | ORAL_TABLET | Freq: Three times a day (TID) | ORAL | Status: DC | PRN
  Administered 2016-08-12: 5 mg via ORAL
  Filled 2016-08-12 (×2): qty 1

## 2016-08-12 MED ORDER — AMANTADINE HCL 100 MG PO CAPS
100.0000 mg | ORAL_CAPSULE | Freq: Two times a day (BID) | ORAL | Status: DC
  Administered 2016-08-13 (×2): 100 mg via ORAL
  Filled 2016-08-12 (×2): qty 1

## 2016-08-12 MED ORDER — OLANZAPINE 5 MG PO TBDP: 10.0000 mg | ORAL_TABLET | Freq: Two times a day (BID) | ORAL | Status: DC

## 2016-08-12 MED ORDER — OLANZAPINE 10 MG IM SOLR: 10.0000 mg | Freq: Two times a day (BID) | INTRAMUSCULAR | Status: DC

## 2016-08-12 MED ORDER — OLANZAPINE 5 MG PO TBDP
10.0000 mg | ORAL_TABLET | Freq: Two times a day (BID) | ORAL | Status: DC
  Administered 2016-08-12 – 2016-08-15 (×6): 10 mg via ORAL
  Filled 2016-08-12 (×7): qty 2

## 2016-08-12 MED ORDER — DIPHENHYDRAMINE HCL 25 MG PO CAPS
50.0000 mg | ORAL_CAPSULE | Freq: Three times a day (TID) | ORAL | Status: DC | PRN
  Administered 2016-08-12: 50 mg via ORAL
  Filled 2016-08-12 (×2): qty 2

## 2016-08-12 NOTE — [facility] Group Notes (Signed)
[facility] Group Notes:  (Nursing/MHT/Case Management/Adjunct)  Date:  08/12/2016  Time:  3:50 AM  Type of Therapy:  Psychoeducational Skills  Participation Level:  Did Not Attend  Summary of Progress/Problems:  Chancy MilroyLaquanda Y Fouad Taul 08/12/2016, 3:50 AM

## 2016-08-12 NOTE — Progress Notes (Signed)
Patient was noted to be asleep at about 8 am during rounds. She was arousable and would uncover her head when called. Patient woke up at about 1045 and ate her breakfast. She then showered and ate lunch after. Patient stated that that her goal was to "get discharged" and she said that she was going to cooperate. However, at about 1130 patient began to demand to be let out from the back hall so that she could go to the dayroom. Staff explained that she was to stay at the back hall due to safety concerns for herself and others. She then threatened that if she was not let out, she was going to show out. Staff talked to patient and helped her focus on diversional activities(music, watching TV, reading magazines, coloring and rest. Haldol 5mg  PO PRN given at 1200. Will continue to monitor and redirect as needed.

## 2016-08-12 NOTE — Progress Notes (Signed)
[facility] MD Progress Note  08/12/2016 11:55 AM Shacoya Hamadou  MRN:  [id number]  Subjective:   [name] is a 21 year old female with history of bipolar disorder admitted floridly manic spite of continuous use of Abilify mainetna, last injection given on 3/5. She was started on Risperdal in the ER. I added Depakote. She is agitated or child-like at times. She throws things and was found on the floor several times. Please consider switching to Zyprexa? If no improvement.  08/10/2016. [name] met with treatment team today. She was hardly cooperating, demanded discharge, and threw a cup of water on the floor. She has been up all night praying, received Haldol and Ativan injection, and was somewhat sedated this morning.  3/10 Bizarre this morning. Patient is saying she needs to be discharged if I don't  I will be held responsible if she dies. Constantly at the nurses station knocking on the door asking for things. Appears hyper religious. Tells me she is still hearing voices but wouldn't elaborate. She couldn't tell me whether or not she was suicidal. She tells me she is having thoughts about hurting others but not here in the unit. The patient will only answered the questions with gestures.  She refused Risperdal last night. She took prn Haldol this morning  3/11 patient was sleeping in bed. I was unable to arouse the patient as she received multiple medications last night. She receive olanzapine 10 mg and later in the middle of the night she also received Haldol, Ativan and Benadryl.  Per nursing staff the patient had to be restricted from the meeting yesterday as she was being disruptive.   Per nursing: Patient was observed pacing in the hall or sitting in the dayroom with patient group.  She had a flat affect and gave only minimal verbal responses when addressed.  She seemed to become more tense when a specific female peer was near her in the hallway and responded to writer's redirection.  Patient  was disorganized as she tried to prepare for bed and was given assistance with direction on what to wear and a reminder to try to void before going to bed.  She went to sleep by 2045, after receiving medications.  She remains on continuous observation.  Patient observed acting bizarre(laying on the floors, wearing a towel on head, pacing slowly,hyperreligious)etc. Patient was constantly at the nursing station with different demands. She stated that she felt bizarre. "I feel bizarre." When asked how so, she laughed inappropriately. Haldol 54m PO administered at 0849. Medication slightly effective as patient remained redirectable. Reassurance provided. Verbal contacts maintained. Patient encouraged to sit in the back hall room due to disruptive/bizarre behaviors.   Patient unable to verbalize frustrations, instead she lay on floor, acted bizarre, aloof, not easy to redirect.  Principal Problem: Bipolar I disorder, current or most recent episode manic, with psychotic features ([place] Diagnosis:   Patient Active Problem List   Diagnosis Date Noted  . Bipolar I disorder, current or most recent episode manic, with psychotic features ([place] [F31.2] 08/08/2016   Total Time spent with patient: 20 minutes  Past Psychiatric History: bipolar disorder.  Past Medical History:  Past Medical History:  Diagnosis Date  . Depression    History reviewed. No pertinent surgical history. Family History: History reviewed. No pertinent family history. Family Psychiatric  History: See H&P. Social History:  History  Alcohol Use No     History  Drug Use No    Social History   Social History  .  Marital status: Single    Spouse name: N/A  . Number of children: N/A  . Years of education: N/A   Social History Main Topics  . Smoking status: Never Smoker  . Smokeless tobacco: Never Used  . Alcohol use No  . Drug use: No  . Sexual activity: Not Asked   Other Topics Concern  . None   Social History  Narrative   ** Merged History Encounter **       Additional Social History:    History of alcohol / drug use?: No history of alcohol / drug abuse     Current Medications: Current Facility-Administered Medications  Medication Dose Route Frequency Provider Last Rate Last Dose  . acetaminophen (TYLENOL) tablet 650 mg  650 mg Oral Q6H PRN Gonzella Lex, MD   650 mg at 08/11/16 1712  . alum & mag hydroxide-simeth (MAALOX/MYLANTA) 200-200-20 MG/5ML suspension 30 mL  30 mL Oral Q4H PRN Gonzella Lex, MD      . Derrill Memo ON 09/03/2016] ARIPiprazole ER SRER 400 mg  400 mg Intramuscular Q28 days Jolanta B Pucilowska, MD      . bacitracin ointment   Topical PRN Jolanta B Pucilowska, MD      . diphenhydrAMINE (BENADRYL) capsule 50 mg  50 mg Oral Q6H PRN Clovis Fredrickson, MD   50 mg at 08/11/16 2010   Or  . diphenhydrAMINE (BENADRYL) injection 50 mg  50 mg Intramuscular Q6H PRN Jolanta B Pucilowska, MD      . haloperidol (HALDOL) tablet 5 mg  5 mg Oral Q6H PRN Clovis Fredrickson, MD   5 mg at 08/12/16 0105   Or  . haloperidol lactate (HALDOL) injection 5 mg  5 mg Intramuscular Q6H PRN Jolanta B Pucilowska, MD      . Influenza vac split quadrivalent PF (FLUARIX) injection 0.5 mL  0.5 mL Intramuscular Tomorrow-1000 Jolanta B Pucilowska, MD      . LORazepam (ATIVAN) tablet 2 mg  2 mg Oral Q4H PRN Clovis Fredrickson, MD   2 mg at 08/12/16 0114   Or  . LORazepam (ATIVAN) injection 2 mg  2 mg Intramuscular Q4H PRN Jolanta B Pucilowska, MD      . LORazepam (ATIVAN) tablet 2 mg  2 mg Oral QHS Hildred Priest, MD   2 mg at 08/11/16 2011  . magnesium hydroxide (MILK OF MAGNESIA) suspension 30 mL  30 mL Oral Daily PRN Gonzella Lex, MD      . OLANZapine zydis (ZYPREXA) disintegrating tablet 10 mg  10 mg Oral QHS Hildred Priest, MD   10 mg at 08/11/16 2011  . ondansetron (ZOFRAN) tablet 4 mg  4 mg Oral Q6H PRN Clovis Fredrickson, MD   4 mg at 08/11/16 0851  . white petrolatum  (VASELINE) gel   Topical PRN Gonzella Lex, MD        Lab Results:  No results found for this or any previous visit (from the past 48 hour(s)).  Blood Alcohol level:  Lab Results  Component Value Date   [facility] <5 08/07/2016   ETH <11 56/25/6389    Metabolic Disorder Labs: Lab Results  Component Value Date   HGBA1C 5.3 08/09/2016   MPG 105 08/09/2016   No results found for: PROLACTIN Lab Results  Component Value Date   CHOL 150 08/09/2016   TRIG 51 08/09/2016   HDL 69 08/09/2016   CHOLHDL 2.2 08/09/2016   VLDL 10 08/09/2016   LDLCALC 71 08/09/2016  Physical Findings: AIMS:  , ,  ,  ,    CIWA:    COWS:     Musculoskeletal: Strength & Muscle Tone: within normal limits Gait & Station: normal Patient leans: N/A  Psychiatric Specialty Exam: Physical Exam  Nursing note and vitals reviewed. Constitutional: She appears well-developed and well-nourished.  HENT:  Head: Normocephalic and atraumatic.  Eyes: Conjunctivae and EOM are normal.  Neck: Normal range of motion.  Respiratory: Effort normal.  Musculoskeletal: Normal range of motion.  Psychiatric: Her speech is normal. Cognition and memory are normal.    Review of Systems  Unable to perform ROS: Acuity of condition    Blood pressure 111/76, pulse (!) 105, temperature 97.8 F (36.6 C), resp. rate 18, height 5' 8"  (1.727 m), weight 66.7 kg (147 lb), SpO2 96 %.Body mass index is 22.35 kg/m.  General Appearance: Casual  Eye Contact:  Good  Speech:  Clear and Coherent  Volume:  Normal  Mood:  Angry and Irritable  Affect:  Congruent  Thought Process:  Disorganized and Descriptions of Associations: Tangential  Orientation:  Full (Time, Place, and Person)  Thought Content:  Delusions, Hallucinations: Auditory and Paranoid Ideation  Suicidal Thoughts:  No  Homicidal Thoughts:  No  Memory:  Immediate;   Fair Recent;   Fair Remote;   Fair  Judgement:  Poor  Insight:  Lacking  Psychomotor Activity:  Increased   Concentration:  Concentration: Fair and Attention Span: Fair  Recall:  [employer] of Knowledge:  Fair  Language:  Fair  Akathisia:  No  Handed:  Right  AIMS (if indicated):     Assets:  Communication Skills Desire for Improvement Housing Physical Health Resilience Social Support  ADL's:  Intact  Cognition:  WNL  Sleep:  Number of Hours: 5.45     Treatment Plan Summary: Daily contact with patient to assess and evaluate symptoms and progress in treatment and Medication management   Ms. Hamadou is a 21 year old female with a history of bipolar disorder admitted in a psychotic, manic episode.  1. Mood and psychosis. The patient has been maintained on injections of Abilify maintena. Her last injection was given on 08/06/2016. She was started on Risperdal in the emergency room. We increase dose to 3 mg nightly and started Depakote for mood stabilization.   2. Agitation. There are orders for Haldol, Ativan and Benadryl if needed.  3. Insomnia. Will increase Restoril to 30 mg.  4. Metabolic syndrome monitoring. Lipid panel, TSH, hemoglobin A1c are normal.   5. EKG. Pending.  6. Disposition. She will be discharged to home with family. She will [facility] up with [facility] in [facility].  3/10 per nurse's patient is not improving. It looks that she has been on and off refusing medications. She complains of nausea and has been taking Zofran.  I will start her on olanzapine zydis  10 mg at bedtime. I will go ahead and discontinue Risperdal. Plan to t titrate up on olanzapine.  Continue Haldol, Benadryl and Ativan prn  3/11 I will increase olanzapine from 10 mg daily at bedtime to 15 mg daily at bedtime. She continues to have orders for Haldol, Ativan and Benadryl as needed.  Hildred Priest, MD 08/12/2016, 11:55 AM

## 2016-08-12 NOTE — Progress Notes (Signed)
Minimal response to ativan and antipsychotics.  Still agitated. Not sleeping.  Will change ativan to q 2 h prn  Will add amantadine to prevent akathisia  Will start depakote DR 500 mg tid

## 2016-08-12 NOTE — Plan of Care (Signed)
Problem: Safety: Goal: Ability to remain free from injury will improve Outcome: Progressing Patient encouraged to declutter her bathroom(She had towels and blankets on her bathroom floor). She eventually picked the towels and blankets and put them in the hamper.

## 2016-08-12 NOTE — Progress Notes (Signed)
[facility] Note for Medication Administration to Non-consenting Patients (For Involuntarily Committed Patients)  Patient: Barbara DankerCharifatou George Date of Birth: 1995-12-24 MRN: [id number]  Reason for the Medication: The patient, without the benefit of the specific treatment measure, is incapable of participating in any available treatment plan that will give the patient a realistic opportunity of improving the patient's condition. There is, without the benefit of the specific treatment measure, a significant possibility that the patient will harm self or others before improvement of the patient's condition is realized.  Consideration of Side Effects: Consideration of the side effects related to the medication plan has been given.  Rationale for Medication Administration: psychotic patient unable to make goiod decisions.    Kristine LineaJolanta Gabryela Kimbrell, MD 08/12/16  2:41 PM   This documentation is good for (7) seven days from the date of the MD signature. New documentation must be completed every seven (7) days with detailed justification in the medical record if the patient requires continued non-emergent administration of psychotropic medications.

## 2016-08-12 NOTE — Progress Notes (Signed)
At 1250, patient pushed the call button. Patient was laying in her bed. She was crying/whining saying that she wanted to be discharged. She also said that she was bored. She was offered activities to do but she refused, cursed at staff and started to speak in [place]. She was talked to and redirected. PRN medication administered for agitation at 1307. At 1430, patient observed to be asleep. Respirations noted(16).

## 2016-08-12 NOTE — Plan of Care (Signed)
Problem: Safety: Goal: Ability to disclose and discuss suicidal ideas will improve Outcome: Progressing Patient was observed pacing in the hall or sitting in the dayroom with patient group.  She had a flat affect and gave only minimal verbal responses when addressed.  She seemed to become more tense when a specific female peer was near her in the hallway and responded to writer's redirection.  Patient was disorganized as she tried to prepare for bed and was given assistance with direction on what to wear and a reminder to try to void before going to bed.  She went to sleep by 2045, after receiving medications.  She remains on continuous observation.

## 2016-08-12 NOTE — [facility] Group Notes (Signed)
[facility] LCSW Group Therapy  08/12/2016 2:43 PM  Type of Therapy:  Group Therapy  Participation Level:  Patient did not attend group on this date.   Summary of Progress/Problems: Communications: Patients identify how individuals communicate with one another appropriately and inappropriately. Patients will be guided to discuss their thoughts, feelings, and behaviors related to barriers when communicating. The group will process together ways to execute positive and appropriate communications.   Barbara George G. Garnette CzechSampson MSW, LCSWA 08/12/2016, 2:43 PM

## 2016-08-12 NOTE — Progress Notes (Signed)
Patient agitated(demanding to be discharged, demanding to be given her Hijab, laying on the floor, speaking gibberish, requiring constant redirection). Scheduled Ativan 2mg  and Zyprexa 10mg  PO given at 1654 with minimal effectiveness noted. Patient continues to require redirection.

## 2016-08-13 LAB — AMMONIA: AMMONIA: 18 umol/L (ref 9–35)

## 2016-08-13 LAB — VALPROIC ACID LEVEL: VALPROIC ACID LVL: 51 ug/mL (ref 50.0–100.0)

## 2016-08-13 MED ORDER — DIVALPROEX SODIUM 500 MG PO DR TAB
500.0000 mg | DELAYED_RELEASE_TABLET | Freq: Three times a day (TID) | ORAL | Status: DC
  Administered 2016-08-13 – 2016-08-16 (×9): 500 mg via ORAL
  Filled 2016-08-13 (×10): qty 1

## 2016-08-13 MED ORDER — LORAZEPAM 2 MG/ML IJ SOLN: 2.0000 mg | INTRAMUSCULAR | Status: DC | PRN

## 2016-08-13 MED ORDER — LORAZEPAM 2 MG PO TABS
2.0000 mg | ORAL_TABLET | Freq: Four times a day (QID) | ORAL | Status: DC | PRN
  Administered 2016-08-13: 2 mg via ORAL
  Filled 2016-08-13: qty 1

## 2016-08-13 NOTE — Progress Notes (Signed)
Patient visible in the milieu.Stated that she found out that her problem is depression.Patient point out the cross on the writer's neck states "this is not fair they let you wear the cross and they won't let me wear the hijab.This is not religious freedom."Patient is constantly at the nurses station demanding things like "put the chair back in my room".

## 2016-08-13 NOTE — Plan of Care (Signed)
Problem: Safety: Goal: Periods of time without injury will increase Outcome: Progressing Patient has remained on continuous observation this shift.  She was awake and walking around at the beginning of this shift and by 2000, was observed sleeping.  She was awake at 2200 and tried to take a shower while dressed.  She paced in the hall, requested to make phone calls to family at 0100, asked for food and drinks, and insisted on going to the dayroom  to color.  She was observed on the locked hall and encouraged to prepare for bed after snack and medications were given.  Speech was soft, rapid and unclear most of the time.  She did not appear to be responding to unseen others.  She required much direction to stay in the bed.  She was observed sleeping by 0200.  Overall, bizarre behavior, disorientation and confusion continues.

## 2016-08-13 NOTE — [facility] Group Notes (Signed)
[facility] Group Notes:  (Nursing/MHT/Case Management/Adjunct)  Date:  08/13/2016  Time:  4:24 PM  Type of Therapy:  Psychoeducational Skills  Participation Level:  Did Not Attend  [name] [name] 08/13/2016, 4:24 PM

## 2016-08-13 NOTE — Progress Notes (Signed)
[facility] MD Progress Note  08/13/2016 11:18 AM Barbara George  MRN:  [id number]  Subjective:   Barbara George is a 21 year old female with history of bipolar disorder admitted floridly manic spite of continuous use of Abilify [place]etna, last injection given on 3/5. She was started on Risperdal in the ER. I added Depakote. She is agitated or child-like at times. She throws things and was found on the floor several times. Please consider switching to Zyprexa? If no improvement.  08/10/2016. Barbara George met with treatment team today. She was hardly cooperating, demanded discharge, and threw a cup of water on the floor. She has been up all night praying, received Haldol and Ativan injection, and was somewhat sedated this morning.  3/10 Bizarre this morning. Patient is saying she needs to be discharged if I don't  I will be held responsible if she dies. Constantly at the nurses station knocking on the door asking for things. Appears hyper religious. Tells me she is still hearing voices but wouldn't elaborate. She couldn't tell me whether or not she was suicidal. She tells me she is having thoughts about hurting others but not here in the unit. The patient will only answered the questions with gestures.  She refused Risperdal last night. She took prn Haldol this morning  3/11 patient was sleeping in bed. I was unable to arouse the patient as she received multiple medications last night. She receive olanzapine 10 mg and later in the middle of the night she also received Haldol, Ativan and Benadryl.  Per nursing staff the patient had to be restricted from the meeting yesterday as she was being disruptive.   08/13/2016. Barbara George is asleep this morning and very difficult to arouse. When up, she is unsteady on her feet. Her speech is very slurred. She is unable to answer simple questions. Multiple medication adjustments were made over the weekend. According to her nurse the patient did take morning medication. I will  discontinue standing Ativan. I will check Depakote and ammonia. I will only keep Zyprexa and discontinue Haldol as the patient received Abilify injection recently  Per nursing: Patient has remained on continuous observation this shift.  She was awake and walking around at the beginning of this shift and by 2000, was observed sleeping.  She was awake at 2200 and tried to take a shower while dressed.  She paced in the hall, requested to make phone calls to family at 0100, asked for food and drinks, and insisted on going to the dayroom  to color.  She was observed on the locked hall and encouraged to prepare for bed after snack and medications were given.  Speech was soft, rapid and unclear most of the time.  She did not appear to be responding to unseen others.  She required much direction to stay in the bed.  She was observed sleeping by 0200.  Overall, bizarre behavior, disorientation and confusion continues.    Principal Problem: Bipolar I disorder, current or most recent episode manic, with psychotic features ([place]) Diagnosis:   Patient Active Problem List   Diagnosis Date Noted  . Bipolar I disorder, current or most recent episode manic, with psychotic features ([place]) [F31.2] 08/08/2016   Total Time spent with patient: 20 minutes  Past Psychiatric History: bipolar disorder.  Past Medical History:  Past Medical History:  Diagnosis Date  . Depression    History reviewed. No pertinent surgical history. Family History: History reviewed. No pertinent family history. Family Psychiatric  History: See H&P. Social  History:  History  Alcohol Use No     History  Drug Use No    Social History   Social History  . Marital status: Single    Spouse name: N/A  . Number of children: N/A  . Years of education: N/A   Social History Main Topics  . Smoking status: Never Smoker  . Smokeless tobacco: Never Used  . Alcohol use No  . Drug use: No  . Sexual activity: Not Asked   Other Topics  Concern  . None   Social History Narrative   ** Merged History Encounter **       Additional Social History:    History of alcohol / drug use?: No history of alcohol / drug abuse     Current Medications: Current Facility-Administered Medications  Medication Dose Route Frequency Provider Last Rate Last Dose  . acetaminophen (TYLENOL) tablet 650 mg  650 mg Oral Q6H PRN Gonzella Lex, MD   650 mg at 08/11/16 1712  . alum & mag hydroxide-simeth (MAALOX/MYLANTA) 200-200-20 MG/5ML suspension 30 mL  30 mL Oral Q4H PRN Gonzella Lex, MD      . bacitracin ointment   Topical PRN [place] Chelf B Shoichi Mielke, MD      . Influenza vac split quadrivalent PF (FLUARIX) injection 0.5 mL  0.5 mL Intramuscular Tomorrow-1000 Nicholous Girgenti B Jenny Omdahl, MD      . LORazepam (ATIVAN) tablet 2 mg  2 mg Oral Q2H PRN Hildred Priest, MD   2 mg at 08/13/16 0132   Or  . LORazepam (ATIVAN) injection 2 mg  2 mg Intramuscular Q2H PRN Hildred Priest, MD      . magnesium hydroxide (MILK OF MAGNESIA) suspension 30 mL  30 mL Oral Daily PRN Gonzella Lex, MD      . OLANZapine (ZYPREXA) injection 10 mg  10 mg Intramuscular BID Hildred Priest, MD       Or  . OLANZapine zydis (ZYPREXA) disintegrating tablet 10 mg  10 mg Oral BID Hildred Priest, MD   10 mg at 08/13/16 0817  . white petrolatum (VASELINE) gel   Topical PRN Gonzella Lex, MD        Lab Results:  No results found for this or any previous visit (from the past 48 hour(s)).  Blood Alcohol level:  Lab Results  Component Value Date   [facility] <5 08/07/2016   ETH <11 17/49/4496    Metabolic Disorder Labs: Lab Results  Component Value Date   HGBA1C 5.3 08/09/2016   MPG 105 08/09/2016   No results found for: PROLACTIN Lab Results  Component Value Date   CHOL 150 08/09/2016   TRIG 51 08/09/2016   HDL 69 08/09/2016   CHOLHDL 2.2 08/09/2016   VLDL 10 08/09/2016   LDLCALC 71 08/09/2016    Physical Findings: AIMS:  , ,   ,  ,    CIWA:    COWS:     Musculoskeletal: Strength & Muscle Tone: within normal limits Gait & Station: normal Patient leans: N/A  Psychiatric Specialty Exam: Physical Exam  Nursing note and vitals reviewed. Psychiatric: Her affect is blunt. Her speech is slurred. She is slowed. Thought content is paranoid and delusional. Cognition and memory are normal. She expresses impulsivity.    Review of Systems  Unable to perform ROS: Mental status change  Psychiatric/Behavioral: Positive for hallucinations. The patient has insomnia.   All other systems reviewed and are negative.   Blood pressure (!) 103/59, pulse (!) 105, temperature 97.1 F (  36.2 C), temperature source Oral, resp. rate 18, height 5' 8"  (1.727 m), weight 66.7 kg (147 lb), SpO2 96 %.Body mass index is 22.35 kg/m.  General Appearance: Casual  Eye Contact:  Good  Speech:  Clear and Coherent  Volume:  Normal  Mood:  Angry and Irritable  Affect:  Congruent  Thought Process:  Disorganized and Descriptions of Associations: Tangential  Orientation:  Full (Time, Place, and Person)  Thought Content:  Delusions, Hallucinations: Auditory and Paranoid Ideation  Suicidal Thoughts:  No  Homicidal Thoughts:  No  Memory:  Immediate;   Fair Recent;   Fair Remote;   Fair  Judgement:  Poor  Insight:  Lacking  Psychomotor Activity:  Increased  Concentration:  Concentration: Fair and Attention Span: Fair  Recall:  [employer] of Knowledge:  Fair  Language:  Fair  Akathisia:  No  Handed:  Right  AIMS (if indicated):     Assets:  Communication Skills Desire for Improvement Housing Physical Health Resilience Social Support  ADL's:  Intact  Cognition:  WNL  Sleep:  Number of Hours: 5.45     Treatment Plan Summary: Daily contact with patient to assess and evaluate symptoms and progress in treatment and Medication management   Ms. George is a 21 year old female with a history of bipolar disorder admitted in a psychotic,  manic episode.  1. Mood and psychosis. The patient has been maintained on injections of Abilify maintena. Her last injection was given on 08/06/2016. She was started on Risperdal in the emergency room but it was changed to Zyprexa over the weekend. I will hold Depakote until VPA and ammonia levels are back.    2. Agitation. I will discontinue prn Haldol, Ativan and Benadryl and standing Ativan due to sedation.   3. Insomnia. Restoril was discontinued over the weekend.   4. Metabolic syndrome monitoring. Lipid panel, TSH, hemoglobin A1c are normal.   5. EKG. Pending.  6. Disposition. She will be discharged to home with family. She will follow up with [facility].   Orson Slick, MD 08/13/2016, 11:18 AM

## 2016-08-13 NOTE — Tx Team (Signed)
Interdisciplinary Treatment and Diagnostic Plan Update  08/13/2016 Time of Session: 10:30 AM Sabra Hamadou MRN: [id number]  Principal Diagnosis: Bipolar I disorder, current or most recent episode manic, with psychotic features (HCC)  Secondary Diagnoses: Principal Problem:   Bipolar I disorder, current or most recent episode manic, with psychotic features (HCC)   Current Medications:  Current Facility-Administered Medications  Medication Dose Route Frequency Provider Last Rate Last Dose  . acetaminophen (TYLENOL) tablet 650 mg  650 mg Oral Q6H PRN Audery Amel, MD   650 mg at 08/11/16 1712  . alum & mag hydroxide-simeth (MAALOX/MYLANTA) 200-200-20 MG/5ML suspension 30 mL  30 mL Oral Q4H PRN Audery Amel, MD      . bacitracin ointment   Topical PRN Jolanta B Pucilowska, MD      . diphenhydrAMINE (BENADRYL) capsule 50 mg  50 mg Oral Q8H PRN Jimmy Footman, MD   50 mg at 08/12/16 2228   Or  . diphenhydrAMINE (BENADRYL) injection 50 mg  50 mg Intramuscular Q8H PRN Jimmy Footman, MD      . divalproex (DEPAKOTE) DR tablet 500 mg  500 mg Oral TID Jimmy Footman, MD   500 mg at 08/13/16 0817  . haloperidol (HALDOL) tablet 5 mg  5 mg Oral Q8H PRN Jimmy Footman, MD   5 mg at 08/12/16 2228   Or  . haloperidol lactate (HALDOL) injection 5 mg  5 mg Intramuscular Q8H PRN Jimmy Footman, MD      . Influenza vac split quadrivalent PF (FLUARIX) injection 0.5 mL  0.5 mL Intramuscular Tomorrow-1000 Jolanta B Pucilowska, MD      . LORazepam (ATIVAN) tablet 2 mg  2 mg Oral Q2H PRN Jimmy Footman, MD   2 mg at 08/13/16 0132   Or  . LORazepam (ATIVAN) injection 2 mg  2 mg Intramuscular Q2H PRN Jimmy Footman, MD      . LORazepam (ATIVAN) tablet 2 mg  2 mg Oral TID Jimmy Footman, MD   2 mg at 08/13/16 0817  . magnesium hydroxide (MILK OF MAGNESIA) suspension 30 mL  30 mL Oral Daily PRN Audery Amel, MD      .  OLANZapine (ZYPREXA) injection 10 mg  10 mg Intramuscular BID Jimmy Footman, MD       Or  . OLANZapine zydis (ZYPREXA) disintegrating tablet 10 mg  10 mg Oral BID Jimmy Footman, MD   10 mg at 08/13/16 0817  . white petrolatum (VASELINE) gel   Topical PRN Audery Amel, MD       PTA Medications: Prescriptions Prior to Admission  Medication Sig Dispense Refill Last Dose  . risperiDONE (RISPERDAL) 3 MG tablet Take 3 mg by mouth at bedtime.   over 1 month at unknown time  . silver sulfADIAZINE (SILVADENE) 1 % cream Apply 1 application topically daily. (Patient not taking: Reported on 08/07/2016) 50 g 0 Completed Course at Unknown time    Patient Stressors: Marital or family conflict Occupational concerns Traumatic event  Patient Strengths: Average or above average intelligence Communication skills Supportive family/friends  Treatment Modalities: Medication Management, Group therapy, Case management,  1 to 1 session with clinician, Psychoeducation, Recreational therapy.   Physician Treatment Plan for Primary Diagnosis: Bipolar I disorder, current or most recent episode manic, with psychotic features (HCC) Long Term Goal(s): Improvement in symptoms so as ready for discharge NA   Short Term Goals: Ability to identify changes in lifestyle to reduce recurrence of condition will improve Ability to verbalize feelings will improve Ability  to disclose and discuss suicidal ideas Ability to demonstrate self-control will improve Ability to identify and develop effective coping behaviors will improve Ability to maintain clinical measurements within normal limits will improve Compliance with prescribed medications will improve Ability to identify triggers associated with substance abuse/mental health issues will improve NA  Medication Management: Evaluate patient's response, side effects, and tolerance of medication regimen.  Therapeutic Interventions: 1 to 1 sessions,  Unit Group sessions and Medication administration.  Evaluation of Outcomes: Progressing  Physician Treatment Plan for Secondary Diagnosis: Principal Problem:   Bipolar I disorder, current or most recent episode manic, with psychotic features (HCC)  Long Term Goal(s): Improvement in symptoms so as ready for discharge NA   Short Term Goals: Ability to identify changes in lifestyle to reduce recurrence of condition will improve Ability to verbalize feelings will improve Ability to disclose and discuss suicidal ideas Ability to demonstrate self-control will improve Ability to identify and develop effective coping behaviors will improve Ability to maintain clinical measurements within normal limits will improve Compliance with prescribed medications will improve Ability to identify triggers associated with substance abuse/mental health issues will improve NA     Medication Management: Evaluate patient's response, side effects, and tolerance of medication regimen.  Therapeutic Interventions: 1 to 1 sessions, Unit Group sessions and Medication administration.  Evaluation of Outcomes: Progressing   RN Treatment Plan for Primary Diagnosis: Bipolar I disorder, current or most recent episode manic, with psychotic features (HCC) Long Term Goal(s): Knowledge of disease and therapeutic regimen to maintain health will improve  Short Term Goals: Ability to demonstrate self-control and Ability to identify and develop effective coping behaviors will improve  Medication Management: RN will administer medications as ordered by provider, will assess and evaluate patient's response and provide education to patient for prescribed medication. RN will report any adverse and/or side effects to prescribing provider.  Therapeutic Interventions: 1 on 1 counseling sessions, Psychoeducation, Medication administration, Evaluate responses to treatment, Monitor vital signs and CBGs as ordered, Perform/monitor CIWA,  COWS, AIMS and Fall Risk screenings as ordered, Perform wound care treatments as ordered.  Evaluation of Outcomes: Progressing   LCSW Treatment Plan for Primary Diagnosis: Bipolar I disorder, current or most recent episode manic, with psychotic features (HCC) Long Term Goal(s): Safe transition to appropriate next level of care at discharge, Engage patient in therapeutic group addressing interpersonal concerns.  Short Term Goals: Engage patient in aftercare planning with referrals and resources, Increase ability to appropriately verbalize feelings and Increase skills for wellness and recovery  Therapeutic Interventions: Assess for all discharge needs, 1 to 1 time with Social worker, Explore available resources and support systems, Assess for adequacy in community support network, Educate family and significant other(s) on suicide prevention, Complete Psychosocial Assessment, Interpersonal group therapy.  Evaluation of Outcomes: Progressing   Progress in Treatment: Attending groups: Yes. Participating in groups: Yes. Taking medication as prescribed: Yes. Toleration medication: Yes. Family/Significant other contact made: Yes, individual(s) contacted:  CSW spoke with pt's father. Patient understands diagnosis: Yes. Discussing patient identified problems/goals with staff: Yes. Medical problems stabilized or resolved: Yes. Denies suicidal/homicidal ideation: Yes. Issues/concerns per patient self-inventory: No.  New problem(s) identified: No, Describe:  None identified.  New Short Term/Long Term Goal(s): Pt's goal is to discharge home and feel better.  Discharge Plan or Barriers: Pt will return home with family and follow-up with [facility] in [place].  Reason for Continuation of Hospitalization: Mania Suicidal ideation  Estimated Length of Stay: 3-5 days  Attendees: Patient: Monika Hamadou  08/13/2016 10:59 AM  Physician: Dr. Kristine LineaJolanta Pucilowska, MD 08/13/2016 10:59 AM   Nursing: Shelia MediaJanet Jones, RN 08/13/2016 10:59 AM  RN Care Manager: 08/13/2016 10:59 AM  Social Worker: Hampton AbbotKadijah Shevelle Smither, MSW, LCSW-A 08/13/2016 10:59 AM  Recreational Therapist: Princella IonElizabeth Greene, LRT, CTRS 08/13/2016 10:59 AM  Other:  08/13/2016 10:59 AM  Other:  08/13/2016 10:59 AM  Other: 08/13/2016 10:59 AM    Scribe for Treatment Team: Lynden OxfordKadijah R Zeven Kocak, LCSWA 08/13/2016 10:59 AM

## 2016-08-13 NOTE — Progress Notes (Signed)
Patient was sleepy and unsteady on her gait this morning.Up in the day room for breakfast.Patient is sad & tearful,states "I need to get out from here."Denies suicidal or homicidal ideations and AV hallucinations.Patient was sleeping most of the time.Wakes up in between & walk around.Compliant with medications.Support & encouragement given.

## 2016-08-13 NOTE — Progress Notes (Signed)
Patient has remained sleeping since 0200, thus far.

## 2016-08-13 NOTE — [facility] Group Notes (Signed)
[facility] Group Notes:  (Nursing/MHT/Case Management/Adjunct)  Date:  08/13/2016  Time:  3:28 AM  Type of Therapy:  Group Therapy  Participation Level:  Did Not Attend    Barbara George 08/13/2016, 3:28 AM

## 2016-08-13 NOTE — [facility] Group Notes (Signed)
[facility] Group Notes:  (Nursing/MHT/Case Management/Adjunct)  Date:  08/13/2016  Time:  11:53 PM  Type of Therapy:    Participation Level:  Didn't come  Participation QualSummary of Progress/Problems:  Mayra NeerJackie L Edder Bellanca 08/13/2016, 11:53 PM

## 2016-08-13 NOTE — Plan of Care (Signed)
Problem: Coping: Goal: Ability to cope will improve Outcome: Not Progressing Patient verbalized that she is depressed.

## 2016-08-14 MED ORDER — TEMAZEPAM 15 MG PO CAPS
30.0000 mg | ORAL_CAPSULE | Freq: Every day | ORAL | Status: DC
  Administered 2016-08-14: 30 mg via ORAL
  Filled 2016-08-14: qty 2

## 2016-08-14 NOTE — Plan of Care (Signed)
Problem: Self-Concept: Goal: Level of anxiety will decrease Outcome: Not Met (add Reason) Patient remains anxious and restless. Walking up/down the halls requesting food, something to read, phone to call family, her hijab for prayer, and shower @ 0100. Pt required frequent redirection to return to room. States "I don't want to go to my room, if I do I will kill myself with a knife." Ativan 2 mg po given PRN for anxiety/sleep @ 0220. Pt continues to wander up/down halls and other patients room. Pt sequestered behind double doors back of the hall for safety and security with +results. Patient able to contract for safety. Safety maintained with q 15 min checks. Will continue to monitor for safety.

## 2016-08-14 NOTE — Plan of Care (Signed)
Problem: Coping: Goal: Ability to verbalize frustrations and anger appropriately will improve Outcome: Progressing Able to express thoughts and feelings appropriately, responded well to redirection.

## 2016-08-14 NOTE — Progress Notes (Signed)
Patient reports no periods month of January and February. HCG results show <5.0, August 07, 2016.

## 2016-08-14 NOTE — Progress Notes (Signed)
[facility] MD Progress Note  08/14/2016 9:17 AM [facility]  MRN:  [id number]  Subjective:   [facility] is a 21 year old female with history of bipolar disorder admitted floridly manic spite of continuous use of Abilify [place]etna, last injection given on 3/5. She was started on Risperdal in the ER. I added Depakote. She is agitated or child-like at times. She throws things and was found on the floor several times. Please consider switching to Zyprexa? If no improvement.  08/10/2016. [facility] met with treatment team today. She was hardly cooperating, demanded discharge, and threw a cup of water on the floor. She has been up all night praying, received Haldol and Ativan injection, and was somewhat sedated this morning.  3/10 Bizarre this morning. Patient is saying she needs to be discharged if I don't  I will be held responsible if she dies. Constantly at the nurses station knocking on the door asking for things. Appears hyper religious. Tells me she is still hearing voices but wouldn't elaborate. She couldn't tell me whether or not she was suicidal. She tells me she is having thoughts about hurting others but not here in the unit. The patient will only answered the questions with gestures.  She refused Risperdal last night. She took prn Haldol this morning  3/11 patient was sleeping in bed. I was unable to arouse the patient as she received multiple medications last night. She receive olanzapine 10 mg and later in the middle of the night she also received Haldol, Ativan and Benadryl.  Per nursing staff the patient had to be restricted from the meeting yesterday as she was being disruptive.   08/13/2016. Barbara [facility] is asleep this morning and very difficult to arouse. When up, she is unsteady on her feet. Her speech is very slurred. She is unable to answer simple questions. Multiple medication adjustments were made over the weekend. According to her nurse the patient did take morning medication. I will  discontinue standing Ativan. I will check Depakote and ammonia. I will only keep Zyprexa and discontinue Haldol as the patient received Abilify injection recently.  08/14/2016. [facility] is again difficult to arouse this morning. She slept 4 hours only last night. She had breakfast this morning and ate all her meals yesterday. She is better organized in her thinking but still intrussive and child-like. I restarted Depakote after her VPA and ammonia level turned out to be normal. She accepts medications. There are no somatic complaints. She reported to her nurse that she has not been menstruating for the past 2 months. HCG on admission was <5.  Per nursing: Patient remains anxious and restless. Walking up/down the halls requesting food, something to read, phone to call family, her hijab for prayer, and shower @ 0100. Pt required frequent redirection to return to room. States "I don't want to go to my room, if I do I will kill myself with a knife." Ativan 2 mg po given PRN for anxiety/sleep @ 0220. Pt continues to wander up/down halls and other patients room. Pt sequestered behind double doors back of the hall for safety and security with +results. Patient able to contract for safety. Safety maintained with q 15 min checks. Will continue to monitor for safety.    Principal Problem: Bipolar I disorder, current or most recent episode manic, with psychotic features ([place]) Diagnosis:   Patient Active Problem List   Diagnosis Date Noted  . Bipolar I disorder, current or most recent episode manic, with psychotic features ([place]) [F31.2] 08/08/2016  Total Time spent with patient: 20 minutes  Past Psychiatric History: bipolar disorder.  Past Medical History:  Past Medical History:  Diagnosis Date  . Depression    History reviewed. No pertinent surgical history. Family History: History reviewed. No pertinent family history. Family Psychiatric  History: See H&P. Social History:  History  Alcohol Use  No     History  Drug Use No    Social History   Social History  . Marital status: Single    Spouse name: N/A  . Number of children: N/A  . Years of education: N/A   Social History Main Topics  . Smoking status: Never Smoker  . Smokeless tobacco: Never Used  . Alcohol use No  . Drug use: No  . Sexual activity: Not Asked   Other Topics Concern  . None   Social History Narrative   ** Merged History Encounter **       Additional Social History:    History of alcohol / drug use?: No history of alcohol / drug abuse     Current Medications: Current Facility-Administered Medications  Medication Dose Route Frequency Provider Last Rate Last Dose  . acetaminophen (TYLENOL) tablet 650 mg  650 mg Oral Q6H PRN Gonzella Lex, MD   650 mg at 08/13/16 1610  . alum & mag hydroxide-simeth (MAALOX/MYLANTA) 200-200-20 MG/5ML suspension 30 mL  30 mL Oral Q4H PRN Gonzella Lex, MD      . bacitracin ointment   Topical PRN Ousman Dise B Oaklie Durrett, MD      . divalproex (DEPAKOTE) DR tablet 500 mg  500 mg Oral Q8H Skeeter Sheard B Walterine Amodei, MD   500 mg at 08/14/16 0810  . Influenza vac split quadrivalent PF (FLUARIX) injection 0.5 mL  0.5 mL Intramuscular Tomorrow-1000 Rolonda Pontarelli B Maleiah Dula, MD      . LORazepam (ATIVAN) tablet 2 mg  2 mg Oral Q6H PRN Clovis Fredrickson, MD   2 mg at 08/13/16 2328   Or  . LORazepam (ATIVAN) injection 2 mg  2 mg Intramuscular Q2H PRN Sheila Gervasi B Nikodem Leadbetter, MD      . magnesium hydroxide (MILK OF MAGNESIA) suspension 30 mL  30 mL Oral Daily PRN Gonzella Lex, MD      . OLANZapine (ZYPREXA) injection 10 mg  10 mg Intramuscular BID Hildred Priest, MD       Or  . OLANZapine zydis (ZYPREXA) disintegrating tablet 10 mg  10 mg Oral BID Hildred Priest, MD   10 mg at 08/14/16 0807  . white petrolatum (VASELINE) gel   Topical PRN Gonzella Lex, MD        Lab Results:  Results for orders placed or performed during the George encounter of 08/08/16 (from  the past 48 hour(s))  Valproic acid level     Status: None   Collection Time: 08/13/16 12:02 PM  Result Value Ref Range   Valproic Acid Lvl 51 50.0 - 100.0 ug/mL  Ammonia     Status: None   Collection Time: 08/13/16 12:02 PM  Result Value Ref Range   Ammonia 18 9 - 35 umol/L    Blood Alcohol level:  Lab Results  Component Value Date   [facility] <5 08/07/2016   ETH <11 44/62/8638    Metabolic Disorder Labs: Lab Results  Component Value Date   HGBA1C 5.3 08/09/2016   MPG 105 08/09/2016   No results found for: PROLACTIN Lab Results  Component Value Date   CHOL 150 08/09/2016   TRIG 51 08/09/2016  HDL 69 08/09/2016   CHOLHDL 2.2 08/09/2016   VLDL 10 08/09/2016   LDLCALC 71 08/09/2016    Physical Findings: AIMS:  , ,  ,  ,    CIWA:    COWS:     Musculoskeletal: Strength & Muscle Tone: within normal limits Gait & Station: normal Patient leans: N/A  Psychiatric Specialty Exam: Physical Exam  Nursing note and vitals reviewed. Psychiatric: Her affect is blunt. Her speech is slurred. She is slowed. Thought content is paranoid and delusional. Cognition and memory are normal. She expresses impulsivity.    Review of Systems  Unable to perform ROS: Mental status change  Psychiatric/Behavioral: Positive for hallucinations. The patient has insomnia.   All other systems reviewed and are negative.   Blood pressure 110/77, pulse 91, temperature 97.8 F (36.6 C), temperature source Oral, resp. rate 18, height _0  (1.727 m), weight 66.7 kg (147 lb), SpO2 96 %.Body mass index is 22.35 kg/m.  General Appearance: Casual  Eye Contact:  Good  Speech:  Clear and Coherent  Volume:  Normal  Mood:  Angry and Irritable  Affect:  Congruent  Thought Process:  Disorganized and Descriptions of Associations: Tangential  Orientation:  Full (Time, Place, and Person)  Thought Content:  Delusions, Hallucinations: Auditory and Paranoid Ideation  Suicidal Thoughts:  No  Homicidal Thoughts:  No   Memory:  Immediate;   Fair Recent;   Fair Remote;   Fair  Judgement:  Poor  Insight:  Lacking  Psychomotor Activity:  Increased  Concentration:  Concentration: Fair and Attention Span: Fair  Recall:  [employer] of Knowledge:  Fair  Language:  Fair  Akathisia:  No  Handed:  Right  AIMS (if indicated):     Assets:  Communication Skills Desire for Improvement Housing Physical Health Resilience Social Support  ADL's:  Intact  Cognition:  WNL  Sleep:  Number of Hours: 4.3     Treatment Plan Summary: Daily contact with patient to assess and evaluate symptoms and progress in treatment and Medication management   Ms. George is a 21 year old female with a history of bipolar disorder admitted in a psychotic, manic episode.  1. Mood and psychosis. The patient has been maintained on injections of Abilify maintena. Her last injection was given on 08/06/2016. She was started on Risperdal in the emergency room but it was changed to Zyprexa over the weekend. VPA 51, Ammonia 19. I restarted Depakote for mood stabilization.     2. Agitation. I will discontinue prn Haldol, Ativan and Benadryl and standing Ativan due to sedation.   3. Insomnia. I will restart Restoril 30 mg nightly.    4. Metabolic syndrome monitoring. Lipid panel, TSH, hemoglobin A1c are normal.   5. EKG. Pending.  6. Disposition. She will be discharged to home with family. She will follow up with [facility] in [facility].   Orson Slick, MD 08/14/2016, 9:17 AM

## 2016-08-14 NOTE — Progress Notes (Signed)
Oriented to place, events, and person, some disorientation to time. Was compliant with medications this morning and able to verbalize thoughts and feelings appropriately. Asking to use her hijab when praying and says "someone can watch me and then take it back when I'm done." Informed that we don't typically do that. Patient makes statement of "I will hurt myself if I don't leave here," informed that self injurious behaviors would not facilitate her discharge. Patient verbally contracted for safety. Denies SI/HI/AVH, behavior remains bizarre. Will continue to redirect and manage as necessary.

## 2016-08-15 MED ORDER — TEMAZEPAM 15 MG PO CAPS
15.0000 mg | ORAL_CAPSULE | Freq: Every day | ORAL | Status: DC
  Administered 2016-08-15: 15 mg via ORAL
  Filled 2016-08-15: qty 1

## 2016-08-15 MED ORDER — OLANZAPINE 5 MG PO TBDP: 20.0000 mg | ORAL_TABLET | Freq: Every day | ORAL | Status: DC

## 2016-08-15 MED ORDER — OLANZAPINE 5 MG PO TBDP
10.0000 mg | ORAL_TABLET | Freq: Every day | ORAL | Status: AC
  Administered 2016-08-15: 10 mg via ORAL
  Filled 2016-08-15: qty 2

## 2016-08-15 NOTE — Progress Notes (Signed)
Recreation Therapy Notes  Date: 03.14.18 Time: 9:30 am Location: Craft Room  Group Topic: Self-esteem  Goal Area(s) Addresses:  Patient will write at least one positive trait about self. Patient will verbalize benefit of having healthy self-esteem.  Behavioral Response: Intermittently Attentive, Left early  Intervention: I Am  Activity: Patients were given a worksheet with the letter I on it and were instructed to write as many positive traits about themselves inside the letter.  Education: LRT educated patients on ways they can increase their self-esteem.  Education Outcome: Patient left before LRT educated group.  Clinical Observations/Feedback: Patient wrote "Allah" on her paper. Patient left group at approximately 9:40 am and returned to group at approximately 9:44 am. Patient put her feet on the table. LRT redirected patient and patient complied. Patient left group at approximately 9:46 am and did not return to group.  Jacquelynn CreeGreene,Tae Robak M, LRT/CTRS 08/15/2016 10:12 AM

## 2016-08-15 NOTE — Progress Notes (Signed)
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

## 2016-08-15 NOTE — Tx Team (Signed)
Interdisciplinary Treatment and Diagnostic Plan Update  08/15/2016 Time of Session: 10:30 AM [facility] MRN: [id number]  Principal Diagnosis: Bipolar I disorder, current or most recent episode manic, with psychotic features (HCC)  Secondary Diagnoses: Principal Problem:   Bipolar I disorder, current or most recent episode manic, with psychotic features (HCC)   Current Medications:  Current Facility-Administered Medications  Medication Dose Route Frequency Provider Last Rate Last Dose  . acetaminophen (TYLENOL) tablet 650 mg  650 mg Oral Q6H PRN Audery Amel, MD   650 mg at 08/13/16 1610  . alum & mag hydroxide-simeth (MAALOX/MYLANTA) 200-200-20 MG/5ML suspension 30 mL  30 mL Oral Q4H PRN Audery Amel, MD      . bacitracin ointment   Topical PRN Jolanta B Pucilowska, MD      . divalproex (DEPAKOTE) DR tablet 500 mg  500 mg Oral Q8H Jolanta B Pucilowska, MD   500 mg at 08/15/16 4098  . Influenza vac split quadrivalent PF (FLUARIX) injection 0.5 mL  0.5 mL Intramuscular Tomorrow-1000 Jolanta B Pucilowska, MD      . LORazepam (ATIVAN) tablet 2 mg  2 mg Oral Q6H PRN Shari Prows, MD   2 mg at 08/13/16 2328   Or  . LORazepam (ATIVAN) injection 2 mg  2 mg Intramuscular Q2H PRN Jolanta B Pucilowska, MD      . magnesium hydroxide (MILK OF MAGNESIA) suspension 30 mL  30 mL Oral Daily PRN Audery Amel, MD      . OLANZapine (ZYPREXA) injection 10 mg  10 mg Intramuscular BID Jimmy Footman, MD       Or  . OLANZapine zydis (ZYPREXA) disintegrating tablet 10 mg  10 mg Oral BID Jimmy Footman, MD   10 mg at 08/15/16 0800  . temazepam (RESTORIL) capsule 30 mg  30 mg Oral QHS Shari Prows, MD   30 mg at 08/14/16 2058  . white petrolatum (VASELINE) gel   Topical PRN Audery Amel, MD       PTA Medications: Prescriptions Prior to Admission  Medication Sig Dispense Refill Last Dose  . risperiDONE (RISPERDAL) 3 MG tablet Take 3 mg by mouth at bedtime.    over 1 month at unknown time  . silver sulfADIAZINE (SILVADENE) 1 % cream Apply 1 application topically daily. (Patient not taking: Reported on 08/07/2016) 50 g 0 Completed Course at Unknown time    Patient Stressors: Marital or family conflict Occupational concerns Traumatic event  Patient Strengths: Average or above average intelligence Communication skills Supportive family/friends  Treatment Modalities: Medication Management, Group therapy, Case management,  1 to 1 session with clinician, Psychoeducation, Recreational therapy.   Physician Treatment Plan for Primary Diagnosis: Bipolar I disorder, current or most recent episode manic, with psychotic features (HCC) Long Term Goal(s): Improvement in symptoms so as ready for discharge NA   Short Term Goals: Ability to identify changes in lifestyle to reduce recurrence of condition will improve Ability to verbalize feelings will improve Ability to disclose and discuss suicidal ideas Ability to demonstrate self-control will improve Ability to identify and develop effective coping behaviors will improve Ability to maintain clinical measurements within normal limits will improve Compliance with prescribed medications will improve Ability to identify triggers associated with substance abuse/mental health issues will improve NA  Medication Management: Evaluate patient's response, side effects, and tolerance of medication regimen.  Therapeutic Interventions: 1 to 1 sessions, Unit Group sessions and Medication administration.  Evaluation of Outcomes: Progressing  Physician Treatment Plan for Secondary Diagnosis:  Principal Problem:   Bipolar I disorder, current or most recent episode manic, with psychotic features (HCC)  Long Term Goal(s): Improvement in symptoms so as ready for discharge NA   Short Term Goals: Ability to identify changes in lifestyle to reduce recurrence of condition will improve Ability to verbalize feelings will  improve Ability to disclose and discuss suicidal ideas Ability to demonstrate self-control will improve Ability to identify and develop effective coping behaviors will improve Ability to maintain clinical measurements within normal limits will improve Compliance with prescribed medications will improve Ability to identify triggers associated with substance abuse/mental health issues will improve NA     Medication Management: Evaluate patient's response, side effects, and tolerance of medication regimen.  Therapeutic Interventions: 1 to 1 sessions, Unit Group sessions and Medication administration.  Evaluation of Outcomes: Progressing   RN Treatment Plan for Primary Diagnosis: Bipolar I disorder, current or most recent episode manic, with psychotic features (HCC) Long Term Goal(s): Knowledge of disease and therapeutic regimen to maintain health will improve  Short Term Goals: Ability to demonstrate self-control and Ability to identify and develop effective coping behaviors will improve  Medication Management: RN will administer medications as ordered by provider, will assess and evaluate patient's response and provide education to patient for prescribed medication. RN will report any adverse and/or side effects to prescribing provider.  Therapeutic Interventions: 1 on 1 counseling sessions, Psychoeducation, Medication administration, Evaluate responses to treatment, Monitor vital signs and CBGs as ordered, Perform/monitor CIWA, COWS, AIMS and Fall Risk screenings as ordered, Perform wound care treatments as ordered.  Evaluation of Outcomes: Progressing   LCSW Treatment Plan for Primary Diagnosis: Bipolar I disorder, current or most recent episode manic, with psychotic features (HCC) Long Term Goal(s): Safe transition to appropriate next level of care at discharge, Engage patient in therapeutic group addressing interpersonal concerns.  Short Term Goals: Engage patient in aftercare  planning with referrals and resources, Increase ability to appropriately verbalize feelings and Increase skills for wellness and recovery  Therapeutic Interventions: Assess for all discharge needs, 1 to 1 time with Social worker, Explore available resources and support systems, Assess for adequacy in community support network, Educate family and significant other(s) on suicide prevention, Complete Psychosocial Assessment, Interpersonal group therapy.  Evaluation of Outcomes: Progressing   Progress in Treatment: Attending groups: Yes. Participating in groups: Yes. Taking medication as prescribed: Yes. Toleration medication: Yes. Family/Significant other contact made: Yes, individual(s) contacted:  CSW spoke with pt's father. Patient understands diagnosis: Yes. Discussing patient identified problems/goals with staff: Yes. Medical problems stabilized or resolved: Yes. Denies suicidal/homicidal ideation: Yes. Issues/concerns per patient self-inventory: No.  New problem(s) identified: No, Describe:  None identified.  New Short Term/Long Term Goal(s): Pt's goal is to discharge home and feel better.  Discharge Plan or Barriers: Pt will return home with family and [place] [facility] [place].  Reason for Continuation of Hospitalization: Mania Suicidal ideation  Estimated Length of Stay: 2-3 days   Attendees: Patient: Barbara [facility]  08/15/2016 10:37 AM  Physician: Dr. Kristine LineaJolanta Pucilowska, MD 08/15/2016 10:37 AM  Nursing: Hulan AmatoGwen Farrish, RN 08/15/2016 10:37 AM  RN Care Manager: 08/15/2016 10:37 AM  Social Worker: Hampton AbbotKadijah Janiene Aarons, MSW, LCSW-A 08/15/2016 10:37 AM  Recreational Therapist: Princella IonElizabeth Greene, LRT, CTRS 08/15/2016 10:37 AM  Other:  08/15/2016 10:37 AM  Other:  08/15/2016 10:37 AM  Other: 08/15/2016 10:37 AM    Scribe for Treatment Team: Lynden OxfordKadijah R Jorge Amparo, LCSWA 08/15/2016 10:37 AM

## 2016-08-15 NOTE — Progress Notes (Signed)
[facility] MD Progress Note  08/15/2016 12:03 PM Barbara George  MRN:  [id number]  Subjective:   Barbara George is a 21 year old female with history of bipolar disorder admitted floridly manic spite of continuous use of Abilify mainetna, last injection given on 3/5. She was started on Risperdal in the ER. I added Depakote. She is agitated or child-like at times. She throws things and was found on the floor several times. Please consider switching to Zyprexa? If no improvement.  08/10/2016. Barbara George met with treatment team today. She was hardly cooperating, demanded discharge, and threw a cup of water on the floor. She has been up all night praying, received Haldol and Ativan injection, and was somewhat sedated this morning.  3/10 Bizarre this morning. Patient is saying she needs to be discharged if I don't  I will be held responsible if she dies. Constantly at the nurses station knocking on the door asking for things. Appears hyper religious. Tells me she is still hearing voices but wouldn't elaborate. She couldn't tell me whether or not she was suicidal. She tells me she is having thoughts about hurting others but not here in the unit. The patient will only answered the questions with gestures.  She refused Risperdal last night. She took prn Haldol this morning  3/11 patient was sleeping in bed. I was unable to arouse the patient as she received multiple medications last night. She receive olanzapine 10 mg and later in the middle of the night she also received Haldol, Ativan and Benadryl.  Per nursing staff the patient had to be restricted from the meeting yesterday as she was being disruptive.   08/13/2016. Barbara George is asleep this morning and very difficult to arouse. When up, she is unsteady on her feet. Her speech is very slurred. She is unable to answer simple questions. Multiple medication adjustments were made over the weekend. According to her nurse the patient did take morning medication. I will  discontinue standing Ativan. I will check Depakote and ammonia. I will only keep Zyprexa and discontinue Haldol as the patient received Abilify injection recently.  08/14/2016. Barbara George is again difficult to arouse this morning. She slept 4 hours only last night. She had breakfast this morning and ate all her meals yesterday. She is better organized in her thinking but still intrussive and child-like. I restarted Depakote after her VPA and ammonia level turned out to be normal. She accepts medications. There are no somatic complaints. She reported to her nurse that she has not been menstruating for the past 2 months. HCG on admission was <5.  08/15/2016. Barbara George met with treatment team today. She is pleasant and able to participate in the interview. There are no odd behaviors. She came to our morning group today briefly. She requests discharge. She complains of feeling tired from medications. Will move Zyprexa to nightime and recheck VPA level in am. Family visited last night. She may be close to baseline.  Per nursing: Oriented to place, events, and person, some disorientation to time. Was compliant with medications this morning and able to verbalize thoughts and feelings appropriately. Asking to use her hijab when praying and says "someone can watch me and then take it back when I'm done." Informed that we don't typically do that. Patient makes statement of "I will hurt myself if I don't leave here," informed that self injurious behaviors would not facilitate her discharge. Patient verbally contracted for safety. Denies SI/HI/AVH, behavior remains bizarre. Will continue to redirect and manage  as necessary  Principal Problem: Bipolar I disorder, current or most recent episode manic, with psychotic features ([place]) Diagnosis:   Patient Active Problem List   Diagnosis Date Noted  . Bipolar I disorder, current or most recent episode manic, with psychotic features ([place]) [F31.2] 08/08/2016   Total  Time spent with patient: 20 minutes  Past Psychiatric History: bipolar disorder.  Past Medical History:  Past Medical History:  Diagnosis Date  . Depression    History reviewed. No pertinent surgical history. Family History: History reviewed. No pertinent family history. Family Psychiatric  History: See H&P. Social History:  History  Alcohol Use No     History  Drug Use No    Social History   Social History  . Marital status: Single    Spouse name: N/A  . Number of children: N/A  . Years of education: N/A   Social History Main Topics  . Smoking status: Never Smoker  . Smokeless tobacco: Never Used  . Alcohol use No  . Drug use: No  . Sexual activity: Not Asked   Other Topics Concern  . None   Social History Narrative   ** Merged History Encounter **       Additional Social History:    History of alcohol / drug use?: No history of alcohol / drug abuse     Current Medications: Current Facility-Administered Medications  Medication Dose Route Frequency Provider Last Rate Last Dose  . acetaminophen (TYLENOL) tablet 650 mg  650 mg Oral Q6H PRN Gonzella Lex, MD   650 mg at 08/13/16 1610  . alum & mag hydroxide-simeth (MAALOX/MYLANTA) 200-200-20 MG/5ML suspension 30 mL  30 mL Oral Q4H PRN Gonzella Lex, MD      . bacitracin ointment   Topical PRN Keeara Frees B Justinn Welter, MD      . divalproex (DEPAKOTE) DR tablet 500 mg  500 mg Oral Q8H Fleurette Woolbright B Deana Krock, MD   500 mg at 08/15/16 5945  . Influenza vac split quadrivalent PF (FLUARIX) injection 0.5 mL  0.5 mL Intramuscular Tomorrow-1000 Averie Meiner B Warner Laduca, MD      . LORazepam (ATIVAN) tablet 2 mg  2 mg Oral Q6H PRN Clovis Fredrickson, MD   2 mg at 08/13/16 2328   Or  . LORazepam (ATIVAN) injection 2 mg  2 mg Intramuscular Q2H PRN Clevie Prout B Mikell Camp, MD      . magnesium hydroxide (MILK OF MAGNESIA) suspension 30 mL  30 mL Oral Daily PRN Gonzella Lex, MD      . OLANZapine zydis (ZYPREXA) disintegrating tablet 10  mg  10 mg Oral QHS Aleiah Mohammed B Naylah Cork, MD      . Derrill Memo ON 08/16/2016] OLANZapine zydis (ZYPREXA) disintegrating tablet 20 mg  20 mg Oral QHS Ala Kratz B Samarion Ehle, MD      . temazepam (RESTORIL) capsule 30 mg  30 mg Oral QHS Rhet Rorke B Demetria Lightsey, MD   30 mg at 08/14/16 2058  . white petrolatum (VASELINE) gel   Topical PRN Gonzella Lex, MD        Lab Results:  No results found for this or any previous visit (from the past 48 hour(s)).  Blood Alcohol level:  Lab Results  Component Value Date   [facility] <5 08/07/2016   ETH <11 85/92/9244    Metabolic Disorder Labs: Lab Results  Component Value Date   HGBA1C 5.3 08/09/2016   MPG 105 08/09/2016   No results found for: PROLACTIN Lab Results  Component Value Date  CHOL 150 08/09/2016   TRIG 51 08/09/2016   HDL 69 08/09/2016   CHOLHDL 2.2 08/09/2016   VLDL 10 08/09/2016   LDLCALC 71 08/09/2016    Physical Findings: AIMS:  , ,  ,  ,    CIWA:    COWS:     Musculoskeletal: Strength & Muscle Tone: within normal limits Gait & Station: normal Patient leans: N/A  Psychiatric Specialty Exam: Physical Exam  Nursing note and vitals reviewed. Psychiatric: Her affect is blunt. Her speech is slurred. She is slowed. Thought content is paranoid and delusional. Cognition and memory are normal. She expresses impulsivity.    Review of Systems  Unable to perform ROS: Mental status change  Psychiatric/Behavioral: Positive for hallucinations. The patient has insomnia.   All other systems reviewed and are negative.   Blood pressure 120/73, pulse 93, temperature 97.9 F (36.6 C), temperature source Oral, resp. rate 18, height _0  (1.727 m), weight 66.7 kg (147 lb), SpO2 100 %.Body mass index is 22.35 kg/m.  General Appearance: Casual  Eye Contact:  Good  Speech:  Clear and Coherent  Volume:  Normal  Mood:  Angry and Irritable  Affect:  Congruent  Thought Process:  Disorganized and Descriptions of Associations: Tangential   Orientation:  Full (Time, Place, and Person)  Thought Content:  Delusions, Hallucinations: Auditory and Paranoid Ideation  Suicidal Thoughts:  No  Homicidal Thoughts:  No  Memory:  Immediate;   Fair Recent;   Fair Remote;   Fair  Judgement:  Poor  Insight:  Lacking  Psychomotor Activity:  Increased  Concentration:  Concentration: Fair and Attention Span: Fair  Recall:  [employer] of Knowledge:  Fair  Language:  Fair  Akathisia:  No  Handed:  Right  AIMS (if indicated):     Assets:  Communication Skills Desire for Improvement Housing Physical Health Resilience Social Support  ADL's:  Intact  Cognition:  WNL  Sleep:  Number of Hours: 8     Treatment Plan Summary: Daily contact with patient to assess and evaluate symptoms and progress in treatment and Medication management   Ms. George is a 21 year old female with a history of bipolar disorder admitted in a psychotic, manic episode.  1. Mood and psychosis. The patient has been maintained on injections of Abilify maintena. Her last injection was given on 08/06/2016. She was started on Zyprexa for psychosis and Depakote for mood stabilization. VPA in am.  2. Agitation. Resolved.    3. Insomnia. I will decrease Restoril to 15 mg.     4. Metabolic syndrome monitoring. Lipid panel, TSH, hemoglobin A1c are normal.   5. EKG. Normal sinus rhythm. QTC 428.  6. Disposition. She will be discharged to home with family. She will follow up with [facility] in [facility].   Orson Slick, MD 08/15/2016, 12:03 PM

## 2016-08-15 NOTE — [facility] Group Notes (Signed)
  [facility]  Date/Time: 08/15/16, 1300  Type of Therapy/Topic:  Group Therapy:  Emotion Regulation  Participation Level:  Active   Mood: pleasant  Description of Group:    The purpose of this group is to assist patients in learning to regulate negative emotions and experience positive emotions. Patients will be guided to discuss ways in which they have been vulnerable to their negative emotions. These vulnerabilities will be juxtaposed with experiences of positive emotions or situations, and patients challenged to use positive emotions to combat negative ones. Special emphasis will be placed on coping with negative emotions in conflict situations, and patients will process healthy conflict resolution skills.  Therapeutic Goals: 1. Patient will identify two positive emotions or experiences to reflect on in order to balance out negative emotions:  2. Patient will label two or more emotions that they find the most difficult to experience:  3. Patient will be able to demonstrate positive conflict resolution skills through discussion or role plays:   Summary of Patient Progress: PT identified fear as the emotion that is difficult for her to experience.  She also shared several examples with the group of situations where she became angry, including the one that led to her current admission, when she became angry with her father.  Pt also volunteered to walk through a conflict resolution scenario with CSW.  Good participation.       Therapeutic Modalities:   Cognitive Behavioral Therapy Feelings Identification Dialectical Behavioral Therapy  Daleen SquibbGreg Prudie Guthridge, LCSW

## 2016-08-15 NOTE — Progress Notes (Signed)
Pt. Requested an Arabic/English Qur'an. Chaplain provided the holy book to patient and she said that she needed a Hijabu to cover her head because she could not read if any single hair was visible. Chaplain could not find a Hijabu but patient accepted Qur'an.

## 2016-08-15 NOTE — Plan of Care (Signed)
Problem: Coping: Goal: Ability to verbalize frustrations and anger appropriately will improve Outcome: Not Met (add Reason) calm and cooperative. Pt appears less needy and intrusive. Flat affect. Visited with family members. States visit went well. C/o hijab for prayer. States she should be allowed to have her hijab while in the hospital. Clinical support provided. Pt encouraged to express feelings and concerns. Med compliant. No PRNs given. Med adm w/education. No voiced thoughts of hurting herself. Denies AV/H. Safety maintained with q 15 min checks. Resting quietly in bed with eyes closed. RR even and unlabored. Will continue to monitor behavior.

## 2016-08-16 LAB — VALPROIC ACID LEVEL: VALPROIC ACID LVL: 123 ug/mL — AB (ref 50.0–100.0)

## 2016-08-16 LAB — AMMONIA: Ammonia: 31 umol/L (ref 9–35)

## 2016-08-16 MED ORDER — TEMAZEPAM 30 MG PO CAPS: 30.0000 mg | ORAL_CAPSULE | Freq: Every day | ORAL | 0 refills | Status: DC

## 2016-08-16 MED ORDER — OLANZAPINE 15 MG PO TABS: 15.0000 mg | ORAL_TABLET | Freq: Every day | ORAL | 1 refills | Status: DC

## 2016-08-16 MED ORDER — OLANZAPINE 20 MG PO TBDP: 20.0000 mg | ORAL_TABLET | Freq: Every day | ORAL | 1 refills | Status: DC

## 2016-08-16 MED ORDER — DIVALPROEX SODIUM 500 MG PO DR TAB: 500.0000 mg | DELAYED_RELEASE_TABLET | Freq: Two times a day (BID) | ORAL | 1 refills | Status: DC

## 2016-08-16 MED ORDER — ARIPIPRAZOLE ER 400 MG IM SRER: 400.0000 mg | INTRAMUSCULAR | Status: DC

## 2016-08-16 MED ORDER — OLANZAPINE 5 MG PO TABS: 15.0000 mg | ORAL_TABLET | Freq: Every day | ORAL | Status: DC

## 2016-08-16 MED ORDER — DIVALPROEX SODIUM 500 MG PO DR TAB: 500.0000 mg | DELAYED_RELEASE_TABLET | Freq: Two times a day (BID) | ORAL | Status: DC

## 2016-08-16 MED ORDER — ARIPIPRAZOLE ER 400 MG IM SRER: 400.0000 mg | INTRAMUSCULAR | 0 refills | Status: DC

## 2016-08-16 MED ORDER — TEMAZEPAM 15 MG PO CAPS: 30.0000 mg | ORAL_CAPSULE | Freq: Every day | ORAL | Status: DC

## 2016-08-16 NOTE — [facility] Suicide Risk Assessment (Signed)
[facility]   Principal Problem: Bipolar I disorder, current or most recent episode manic, with psychotic features [facility](HCC) Discharge Diagnoses:  Patient Active Problem List   Diagnosis Date Noted  . Bipolar I disorder, current or most recent episode manic, with psychotic features ([facility]) [F31.2] 08/08/2016    Total Time spent with patient: 30 minutes  Musculoskeletal: Strength & Muscle Tone: within normal limits Gait & Station: normal Patient leans: N/A  Psychiatric Specialty Exam: Review of Systems  All other systems reviewed and are negative.   Blood pressure (!) 127/93, pulse 99, temperature 97.9 F (36.6 C), resp. rate 18, height 5\' 8"  (1.727 m), weight 66.7 kg (147 lb), SpO2 100 %.Body mass index is 22.35 kg/m.  General Appearance: Casual  Eye Contact::  Good  Speech:  Clear and Coherent409  Volume:  Normal  Mood:  Euthymic  Affect:  Appropriate  Thought Process:  Goal Directed and Descriptions of Associations: Intact  Orientation:  Full (Time, Place, and Person)  Thought Content:  WDL  Suicidal Thoughts:  No  Homicidal Thoughts:  No  Memory:  Immediate;   Fair Recent;   Fair Remote;   Fair  Judgement:  Impaired  Insight:  Shallow  Psychomotor Activity:  Normal  Concentration:  Fair  Recall:  [employer]Fair  Fund of Knowledge:Fair  Language: Fair  Akathisia:  No  Handed:  Right  AIMS (if indicated):     Assets:  Communication Skills Desire for Improvement Financial Resources/Insurance Housing Physical Health Resilience Social Support  Sleep:  Number of Hours: 4.3  Cognition: WNL  ADL's:  Intact   Mental Status Per Nursing Assessment::   On Admission:     Demographic Factors:  Adolescent or young adult and Unemployed  Loss Factors: Decrease in vocational status and Financial problems/change in socioeconomic status  Historical Factors: Impulsivity  Risk Reduction Factors:   Sense of responsibility to family, Religious beliefs  about death, Living with another person, especially a relative, Positive social support and Positive therapeutic relationship  Continued Clinical Symptoms:  Bipolar Disorder:   Mixed State Previous Psychiatric Diagnoses and Treatments  Cognitive Features That Contribute To Risk:  None    Suicide Risk:  Minimal: No identifiable suicidal ideation.  Patients presenting with no risk factors but with morbid ruminations; may be classified as minimal risk based on the severity of the depressive symptoms    Plan Of Care/Follow-up recommendations:  Activity:  as tolerated Diet:  regular Other:  low sodium heart healthy  Kristine LineaJolanta Casanova Schurman, MD 08/16/2016, 10:36 AM

## 2016-08-16 NOTE — Progress Notes (Signed)
Provided and reviewed discharge paperwork and prescriptions. Verified understanding by use of teach back method. Verbalizes understanding as well. Pt denies SI/HI/AVH, pain. Belongings to be returned on discharge as noted. Pt's father to pick her up and transport her home. Safety maintained with every 15 minute checks. Will continue to monitor.

## 2016-08-16 NOTE — Progress Notes (Signed)
Recreation Therapy Notes  Date: 03.15.18 Time: 9:30 am Location: Craft Room  Group Topic: Leisure Education  Goal Area(s) Addresses:  Patient will identify activities for each letter of the alphabet. Patient will verbalize ability to integrate positive leisure into life post d/c. Patient will verbalize ability to use leisure as a Associate Professorcoping skill.  Behavioral Response: Attentive, Interactive  Intervention: Leisure Alphabet  Activity: Patients were given a Leisure Information systems managerAlphabet worksheet and were instructed to write healthy leisure activities for each letter of the alphabet.  Education: LRT educated patients on what they need to participate in leisure.  Education Outcome: In group clarification offered   Clinical Observations/Feedback: Patient wrote healthy leisure activities with assistance from LRT. Patient contributed to group discussion by stating some of her healthy leisure activities and what emotions she feels when she participates in leisure.  Jacquelynn CreeGreene,Naryah Clenney M, LRT/CTRS 08/16/2016 10:09 AM

## 2016-08-16 NOTE — Plan of Care (Signed)
Problem: Coping: Goal: Ability to verbalize frustrations and anger appropriately will improve Outcome: Not Met (add Reason) Calm and cooperative. Bright affect. Less needy and intrusive. Attends group. Interacting with peers. Med compliant. No PRNs given. Med adm w/education. No voiced thoughts of hurting herself. Safety maintained with q 15 min checks.

## 2016-08-16 NOTE — [facility] Group Notes (Signed)
[facility] LCSW Group Therapy Note  Date/Time: 08/16/16, 1300  Type of Therapy/Topic:  Group Therapy:  Balance in Life  Participation Level:  active  Description of Group:    This group will address the concept of balance and how it feels and looks when one is unbalanced. Patients will be encouraged to process areas in their lives that are out of balance, and identify reasons for remaining unbalanced. Facilitators will guide patients utilizing problem- solving interventions to address and correct the stressor making their life unbalanced. Understanding and applying boundaries will be explored and addressed for obtaining  and maintaining a balanced life. Patients will be encouraged to explore ways to assertively make their unbalanced needs known to significant others in their lives, using other group members and facilitator for support and feedback.  Therapeutic Goals: 1. Patient will identify two or more emotions or situations they have that consume much of in their lives. 2. Patient will identify signs/triggers that life has become out of balance:  3. Patient will identify two ways to set boundaries in order to achieve balance in their lives:  4. Patient will demonstrate ability to communicate their needs through discussion and/or role plays  Summary of Patient Progress:Pt was very active in group today but continues to be difficult to understand.  She talked about her family, her ex-boyfriend, and finances in response to CSW questions about areas that her life is out of balance.  She was not able to respond specifically to questions about signs/triggers and the need to set boundaries.          Therapeutic Modalities:   Cognitive Behavioral Therapy Solution-Focused Therapy Assertiveness Training  Daleen SquibbGreg Aunesti Pellegrino, [place]CS[place]

## 2016-08-16 NOTE — Tx Team (Signed)
Interdisciplinary Treatment and Diagnostic Plan Update  08/16/2016 Time of Session: 10:30 AM Barbara George MRN: [id number]  Principal Diagnosis: Bipolar I disorder, current or most recent episode manic, with psychotic features (HCC)  Secondary Diagnoses: Principal Problem:   Bipolar I disorder, current or most recent episode manic, with psychotic features (HCC)   Current Medications:  Current Facility-Administered Medications  Medication Dose Route Frequency Provider Last Rate Last Dose  . acetaminophen (TYLENOL) tablet 650 mg  650 mg Oral Q6H PRN Audery Amel, MD   650 mg at 08/16/16 0835  . alum & mag hydroxide-simeth (MAALOX/MYLANTA) 200-200-20 MG/5ML suspension 30 mL  30 mL Oral Q4H PRN Audery Amel, MD      . Melene Muller ON 09/03/2016] ARIPiprazole ER SRER 400 mg  400 mg Intramuscular Q28 days Jolanta B Pucilowska, MD      . bacitracin ointment   Topical PRN Shari Prows, MD      . Melene Muller ON 08/17/2016] divalproex (DEPAKOTE) DR tablet 500 mg  500 mg Oral Q12H Jolanta B Pucilowska, MD      . Influenza vac split quadrivalent PF (FLUARIX) injection 0.5 mL  0.5 mL Intramuscular Tomorrow-1000 Jolanta B Pucilowska, MD      . magnesium hydroxide (MILK OF MAGNESIA) suspension 30 mL  30 mL Oral Daily PRN Audery Amel, MD      . OLANZapine (ZYPREXA) tablet 15 mg  15 mg Oral QHS Jolanta B Pucilowska, MD      . temazepam (RESTORIL) capsule 30 mg  30 mg Oral QHS Jolanta B Pucilowska, MD      . white petrolatum (VASELINE) gel   Topical PRN Audery Amel, MD       PTA Medications: Prescriptions Prior to Admission  Medication Sig Dispense Refill Last Dose  . risperiDONE (RISPERDAL) 3 MG tablet Take 3 mg by mouth at bedtime.   over 1 month at unknown time  . silver sulfADIAZINE (SILVADENE) 1 % cream Apply 1 application topically daily. (Patient not taking: Reported on 08/07/2016) 50 g 0 Completed Course at Unknown time    Patient Stressors: Marital or family conflict Occupational  concerns Traumatic event  Patient Strengths: Average or above average intelligence Communication skills Supportive family/friends  Treatment Modalities: Medication Management, Group therapy, Case management,  1 to 1 session with clinician, Psychoeducation, Recreational therapy.   Physician Treatment Plan for Primary Diagnosis: Bipolar I disorder, current or most recent episode manic, with psychotic features (HCC) Long Term Goal(s): Improvement in symptoms so as ready for discharge NA   Short Term Goals: Ability to identify changes in lifestyle to reduce recurrence of condition will improve Ability to verbalize feelings will improve Ability to disclose and discuss suicidal ideas Ability to demonstrate self-control will improve Ability to identify and develop effective coping behaviors will improve Ability to maintain clinical measurements within normal limits will improve Compliance with prescribed medications will improve Ability to identify triggers associated with substance abuse/mental health issues will improve NA  Medication Management: Evaluate patient's response, side effects, and tolerance of medication regimen.  Therapeutic Interventions: 1 to 1 sessions, Unit Group sessions and Medication administration.  Evaluation of Outcomes: Adequate for discharge.  Physician Treatment Plan for Secondary Diagnosis: Principal Problem:   Bipolar I disorder, current or most recent episode manic, with psychotic features (HCC)  Long Term Goal(s): Improvement in symptoms so as ready for discharge NA   Short Term Goals: Ability to identify changes in lifestyle to reduce recurrence of condition will improve Ability to verbalize  feelings will improve Ability to disclose and discuss suicidal ideas Ability to demonstrate self-control will improve Ability to identify and develop effective coping behaviors will improve Ability to maintain clinical measurements within normal limits will  improve Compliance with prescribed medications will improve Ability to identify triggers associated with substance abuse/mental health issues will improve NA     Medication Management: Evaluate patient's response, side effects, and tolerance of medication regimen.  Therapeutic Interventions: 1 to 1 sessions, Unit Group sessions and Medication administration.  Evaluation of Outcomes: Adequate for discharge.  RN Treatment Plan for Primary Diagnosis: Bipolar I disorder, current or most recent episode manic, with psychotic features (HCC) Long Term Goal(s): Knowledge of disease and therapeutic regimen to maintain health will improve  Short Term Goals: Ability to demonstrate self-control and Ability to identify and develop effective coping behaviors will improve  Medication Management: RN will administer medications as ordered by provider, will assess and evaluate patient's response and provide education to patient for prescribed medication. RN will report any adverse and/or side effects to prescribing provider.  Therapeutic Interventions: 1 on 1 counseling sessions, Psychoeducation, Medication administration, Evaluate responses to treatment, Monitor vital signs and CBGs as ordered, Perform/monitor CIWA, COWS, AIMS and Fall Risk screenings as ordered, Perform wound care treatments as ordered.  Evaluation of Outcomes: Adequate for discharge.   LCSW Treatment Plan for Primary Diagnosis: Bipolar I disorder, current or most recent episode manic, with psychotic features (HCC) Long Term Goal(s): Safe transition to appropriate next level of care at discharge, Engage patient in therapeutic group addressing interpersonal concerns.  Short Term Goals: Engage patient in aftercare planning with referrals and resources, Increase ability to appropriately verbalize feelings and Increase skills for wellness and recovery  Therapeutic Interventions: Assess for all discharge needs, 1 to 1 time with Social worker,  Explore available resources and support systems, Assess for adequacy in community support network, Educate family and significant other(s) on suicide prevention, Complete Psychosocial Assessment, Interpersonal group therapy.  Evaluation of Outcomes: Adequate for discharge.    Progress in Treatment: Attending groups: Yes. Participating in groups: Yes. Taking medication as prescribed: Yes. Toleration medication: Yes. Family/Significant other contact made: Yes, individual(s) contacted:  CSW spoke with pt's father. Patient understands diagnosis: Yes. Discussing patient identified problems/goals with staff: Yes. Medical problems stabilized or resolved: Yes. Denies suicidal/homicidal ideation: Yes. Issues/concerns per patient self-inventory: No.  New problem(s) identified: No, Describe:  None identified.  New Short Term/Long Term Goal(s): Pt's goal is to discharge home and feel better.  Discharge Plan or Barriers: Pt will return home with family and follow-up with [facility] [place].  Reason for Continuation of Hospitalization: Mania Suicidal ideation  Estimated Length of Stay: D/C 08/16/2016  Attendees: Patient: Barbara George  08/16/2016 11:37 AM  Physician: Dr. Kristine LineaJolanta Pucilowska, MD 08/16/2016 11:37 AM  Nursing: Leonia ReaderPhyllis Cobb, BSN, RN 08/16/2016 11:37 AM  RN Care Manager: 08/16/2016 11:37 AM  Social Worker: Hampton AbbotKadijah Elvan Ebron, MSW, LCSW-A 08/16/2016 11:37 AM  Recreational Therapist: Princella IonElizabeth Greene, LRT, CTRS 08/16/2016 11:37 AM  Other:  08/16/2016 11:37 AM  Other:  08/16/2016 11:37 AM  Other: 08/16/2016 11:37 AM    Scribe for Treatment Team: Lynden OxfordKadijah R Hasan Douse, LCSWA 08/16/2016 11:37 AM

## 2016-08-16 NOTE — [facility] Group Notes (Signed)
Goals Group Date/Time: 08/16/2016 9:00 AM Type of Therapy and Topic: Group Therapy: Goals Group: SMART Goals   Participation Level: Moderate  Description of Group:    The purpose of a daily goals group is to assist and guide patients in setting recovery/wellness-related goals. The objective is to set goals as they relate to the crisis in which they were admitted. Patients will be using SMART goal modalities to set measurable goals. Characteristics of realistic goals will be discussed and patients will be assisted in setting and processing how one will reach their goal. Facilitator will also assist patients in applying interventions and coping skills learned in psycho-education groups to the SMART goal and process how one will achieve defined goal.   Therapeutic Goals:   -Patients will develop and document one goal related to or their crisis in which brought them into treatment.  -Patients will be guided by LCSW using SMART goal setting modality in how to set a measurable, attainable, realistic and time sensitive goal.  -Patients will process barriers in reaching goal.  -Patients will process interventions in how to overcome and successful in reaching goal.   Patient's Goal: To be discharged today: talk to MD, find out what follow up treatment is.  Also to stay more physically active once I get out: exercise and being outdoors more.   Therapeutic Modalities:  Motivational Interviewing  Research officer, political partyCognitive Behavioral Therapy  Crisis Intervention Model  SMART goals setting   Daleen SquibbGreg Hadley [place], [place]
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
## 2016-08-16 NOTE — Progress Notes (Signed)
  [facility]  Will you be returning to the same living situation after discharge:  Yes,  home with family. At discharge, do you have transportation home?: Yes,  father will pick pt up. Do you have the ability to pay for your medications: No.  Release of information consent forms completed and in the chart;  Patient's signature needed at discharge.  Patient to Follow up at: Follow-up Information    [facility]. Go on 08/21/2016.   Why:  [facility]. You will meet with Nyra JabsFrancis Gill. Please bring your discharge paperwork to this appointment. Contact information: Address: [address], [place], [place] [address] Phone: [phone] Fax: [phone]          Next level of care provider [facility] Link:no  Safety Planning and Suicide Prevention discussed: Yes,  SPE completed with father.  Have you used any form of tobacco in the last 30 days? (Cigarettes, Smokeless Tobacco, Cigars, and/or Pipes): No  Has patient been referred to the Quitline?: N/A patient is not a smoker  Patient has been referred for addiction treatment: N/A  Lynden OxfordKadijah R Chloris Marcoux, MSW, LCSW-A 08/16/2016, 11:38 AM

## 2016-08-16 NOTE — Progress Notes (Signed)
Pt awake, alert, oriented and up on unit this morning. Complains of being "bored and I need my family." Reports good sleep last night, good appetite, normal energy, good concentration. Rates depression 6/10, hopelessness 7/10, anxiety 8/10 (low 0-10 high). Denies SI/HI/AVH. Complained of arm pain this morning post IM, PRN tylenol given as ordered. Goal today is "going home" by "behave." Pt does request multiple times to be discharged. She is calm and cooperative today without bizarre behaviors. Support and encouragement provided. Medication given as ordered with education. Safety maintained with every 15 minute checks. Will continue to monitor.

## 2016-08-28 ENCOUNTER — Encounter (HOSPITAL_COMMUNITY): Payer: Self-pay

## 2016-08-28 ENCOUNTER — Emergency Department (HOSPITAL_COMMUNITY)
Admission: EM | Admit: 2016-08-28 | Discharge: 2016-08-28 | Disposition: A | Discharge status: Home / Self Care | Payer: Self-pay | Attending: Emergency Medicine | Admitting: Emergency Medicine

## 2016-08-28 DIAGNOSIS — L509 Urticaria, unspecified: Secondary | ICD-10-CM | POA: Insufficient documentation

## 2016-08-28 DIAGNOSIS — Z91010 Allergy to peanuts: Secondary | ICD-10-CM | POA: Insufficient documentation

## 2016-08-28 DIAGNOSIS — Z79899 Other long term (current) drug therapy: Secondary | ICD-10-CM | POA: Insufficient documentation

## 2016-08-28 MED ORDER — FAMOTIDINE 20 MG PO TABS: ORAL_TABLET | ORAL | 0 refills | Status: DC

## 2016-08-28 MED ORDER — DIPHENHYDRAMINE HCL 25 MG PO CAPS
50.0000 mg | ORAL_CAPSULE | Freq: Once | ORAL | Status: AC
  Administered 2016-08-28: 50 mg via ORAL
  Filled 2016-08-28: qty 2

## 2016-08-28 MED ORDER — DIPHENHYDRAMINE HCL 25 MG PO CAPS: ORAL_CAPSULE | ORAL | 0 refills | Status: DC

## 2016-08-28 MED ORDER — FAMOTIDINE 20 MG PO TABS
20.0000 mg | ORAL_TABLET | Freq: Once | ORAL | Status: AC
  Administered 2016-08-28: 20 mg via ORAL
  Filled 2016-08-28: qty 1

## 2016-08-28 NOTE — ED Triage Notes (Addendum)
Patient c/o itching and rash on entire body x 3-4 days. Patient states she has started 2 new medications(olanzapine and temazepam) Patient states he physician told her to stop these meds. Patient denies SOB or difficulty swallowing.

## 2016-08-28 NOTE — ED Provider Notes (Signed)
[facility] DEPT Provider Note    By signing my name below, I, Earmon Phoenix, attest that this documentation has been prepared under the direction and in the presence of Elpidio Anis, PA-C. Electronically Signed: Earmon Phoenix, ED Scribe. 08/28/16. 9:06 PM.    History   Chief Complaint Chief Complaint  Patient presents with  . Rash    The history is provided by the patient and medical records. No language interpreter was used.    Barbara George is a 21 y.o. female with PMHx of depression and bipolar disorder who presents to the Emergency Department complaining of an itching rash all over her body. She states she recently started new medications and was instructed to stop taking them by her doctor whom she saw today until they can figure out which medication is causing the reaction. She has not taken anything to treat her symptoms. There are no modifying factors noted. She denies SOB, difficulty breathing or swallowing, facial or lip swelling.   Past Medical History:  Diagnosis Date  . Depression     Patient Active Problem List   Diagnosis Date Noted  . Bipolar I disorder, current or most recent episode manic, with psychotic features (HCC) 08/08/2016    History reviewed. No pertinent surgical history.  OB History    No data available       Home Medications    Prior to Admission medications   Medication Sig Start Date End Date Taking? Authorizing Provider  ARIPiprazole ER 400 MG SRER Inject 400 mg into the muscle every 28 (twenty-eight) days. 09/03/16   Sparsh Callens Prows, MD  divalproex (DEPAKOTE) 500 MG DR tablet Take 1 tablet (500 mg total) by mouth every 12 (twelve) hours. 08/17/16   Sherrita Riederer Prows, MD  OLANZapine (ZYPREXA) 15 MG tablet Take 1 tablet (15 mg total) by mouth at bedtime. 08/16/16   Jolanta B Pucilowska, MD  temazepam (RESTORIL) 30 MG capsule Take 1 capsule (30 mg total) by mouth at bedtime. 08/16/16   Andersson Larrabee Prows, MD     Family History Family History  Problem Relation Age of Onset  . Hypertension Father     Social History Social History  Substance Use Topics  . Smoking status: Never Smoker  . Smokeless tobacco: Never Used  . Alcohol use No     Allergies   Peanut-containing drug products and Pork-derived products   Review of Systems Review of Systems  HENT: Negative for facial swelling and trouble swallowing.   Respiratory: Negative for shortness of breath.   Skin: Positive for rash.     Physical Exam Updated Vital Signs BP 129/77 (BP Location: Left Arm)   Pulse (!) 104   Temp 98.3 F (36.8 C) (Oral)   Resp 16   Ht 5\' 8"  (1.727 m)   Wt 160 lb (72.6 kg)   LMP 07/31/2016 (Approximate)   SpO2 100%   BMI 24.33 kg/m   Physical Exam  Constitutional: She is oriented to person, place, and time. She appears well-developed and well-nourished.  HENT:  Head: Normocephalic and atraumatic.  Neck: Normal range of motion.  Cardiovascular: Normal rate.   Pulmonary/Chest: Effort normal.  Musculoskeletal: Normal range of motion.  Neurological: She is alert and oriented to person, place, and time.  Skin: Skin is warm and dry. There is erythema.  There is a diffuse, raised rash c/w hives.  Psychiatric: She has a normal mood and affect. Her behavior is normal.  Nursing note and vitals reviewed.    ED Treatments /  Results  DIAGNOSTIC STUDIES: Oxygen Saturation is 100% on RA, normal by my interpretation.   COORDINATION OF CARE: 9:06 PM- Will prescribe Pepcid and Benadryl. Pt verbalizes understanding and agrees to plan.  Medications  famotidine (PEPCID) tablet 20 mg (not administered)  diphenhydrAMINE (BENADRYL) capsule 50 mg (not administered)    Labs (all labs ordered are listed, but only abnormal results are displayed) Labs Reviewed - No data to display  EKG  EKG Interpretation None       Radiology No results found.  Procedures Procedures (including critical care  time)  Medications Ordered in ED Medications  famotidine (PEPCID) tablet 20 mg (not administered)  diphenhydrAMINE (BENADRYL) capsule 50 mg (not administered)     Initial Impression / Assessment and Plan / ED Course  I have reviewed the triage vital signs and the nursing notes.  Pertinent labs & imaging results that were available during my care of the patient were reviewed by me and considered in my medical decision making (see chart for details).     Patient presents with rash c/w hives. Treated with Benadryl and Pepcid. No SOB, difficulty swallowing, throat tightening. Stable for discharge home   I personally performed the services described in this documentation, which was scribed in my presence. The recorded information has been reviewed and is accurate.   Final Clinical Impressions(s) / ED Diagnoses   Final diagnoses:  Hives    New Prescriptions New Prescriptions   No medications on file     Elpidio AnisShari Filmore Molyneux, PA-C 08/30/16 0537    Arby BarretteMarcy Pfeiffer, MD 09/02/16 [phone]

## 2016-09-07 ENCOUNTER — Emergency Department (HOSPITAL_COMMUNITY)
Admission: EM | Admit: 2016-09-07 | Discharge: 2016-09-08 | Disposition: A | Discharge status: Other Acute Inpatient Hospital | Payer: Medicaid Other

## 2016-09-07 ENCOUNTER — Encounter (HOSPITAL_COMMUNITY): Payer: Self-pay | Admitting: Emergency Medicine

## 2016-09-07 DIAGNOSIS — Z91010 Allergy to peanuts: Secondary | ICD-10-CM | POA: Insufficient documentation

## 2016-09-07 DIAGNOSIS — Z79899 Other long term (current) drug therapy: Secondary | ICD-10-CM | POA: Diagnosis not present

## 2016-09-07 DIAGNOSIS — R45851 Suicidal ideations: Secondary | ICD-10-CM

## 2016-09-07 DIAGNOSIS — F319 Bipolar disorder, unspecified: Secondary | ICD-10-CM | POA: Diagnosis not present

## 2016-09-07 HISTORY — DX: Bipolar disorder, unspecified: F31.9

## 2016-09-07 LAB — RAPID URINE DRUG SCREEN, HOSP PERFORMED
AMPHETAMINES: NOT DETECTED
BARBITURATES: NOT DETECTED
BENZODIAZEPINES: NOT DETECTED
Cocaine: NOT DETECTED
Opiates: NOT DETECTED
Tetrahydrocannabinol: NOT DETECTED

## 2016-09-07 LAB — ETHANOL: Alcohol, Ethyl (B): <5 mg/dL (ref <5)

## 2016-09-07 LAB — COMPREHENSIVE METABOLIC PANEL
ALBUMIN: 4 g/dL (ref 3.5–5.0)
ALK PHOS: 46 U/L (ref 38–126)
ALT: 9 U/L — ABNORMAL LOW (ref 14–54)
ANION GAP: 8 (ref 5–15)
AST: 18 U/L (ref 15–41)
BILIRUBIN TOTAL: 0.4 mg/dL (ref 0.3–1.2)
BUN: 5 mg/dL — AB (ref 6–20)
CALCIUM: 9.3 mg/dL (ref 8.9–10.3)
CO2: 27 mmol/L (ref 22–32)
Chloride: 105 mmol/L (ref 101–111)
Creatinine, Ser: 0.67 mg/dL (ref 0.44–1.00)
GFR calc Af Amer: >60 mL/min (ref >60)
GFR calc non Af Amer: >60 mL/min (ref >60)
GLUCOSE: 92 mg/dL (ref 65–99)
Potassium: 3.6 mmol/L (ref 3.5–5.1)
Sodium: 140 mmol/L (ref 135–145)
TOTAL PROTEIN: 7.9 g/dL (ref 6.5–8.1)

## 2016-09-07 LAB — URINALYSIS, ROUTINE W REFLEX MICROSCOPIC
Bilirubin Urine: NEGATIVE
GLUCOSE, UA: NEGATIVE mg/dL
Hgb urine dipstick: NEGATIVE
Ketones, ur: NEGATIVE mg/dL
LEUKOCYTES UA: NEGATIVE
Nitrite: NEGATIVE
PH: 8 (ref 5.0–8.0)
Protein, ur: NEGATIVE mg/dL
Specific Gravity, Urine: 1.001 — ABNORMAL LOW (ref 1.005–1.030)

## 2016-09-07 LAB — HCG, SERUM, QUALITATIVE: PREG SERUM: NEGATIVE

## 2016-09-07 LAB — CBC
HEMATOCRIT: 36.8 % (ref 36.0–46.0)
HEMOGLOBIN: 12 g/dL (ref 12.0–15.0)
MCH: 27.2 pg (ref 26.0–34.0)
MCHC: 32.6 g/dL (ref 30.0–36.0)
MCV: 83.4 fL (ref 78.0–100.0)
Platelets: 341 10*3/uL (ref 150–400)
RBC: 4.41 MIL/uL (ref 3.87–5.11)
RDW: 13.3 % (ref 11.5–15.5)
WBC: 6.2 10*3/uL (ref 4.0–10.5)

## 2016-09-07 LAB — VALPROIC ACID LEVEL: VALPROIC ACID LVL: 127 ug/mL — AB (ref 50.0–100.0)

## 2016-09-07 LAB — SALICYLATE LEVEL

## 2016-09-07 LAB — ACETAMINOPHEN LEVEL: Acetaminophen (Tylenol), Serum: <10 ug/mL — ABNORMAL LOW (ref 10–30)

## 2016-09-07 LAB — PREGNANCY, URINE: Preg Test, Ur: NEGATIVE

## 2016-09-07 MED ORDER — LORAZEPAM 1 MG PO TABS
1.0000 mg | ORAL_TABLET | Freq: Once | ORAL | Status: AC
  Administered 2016-09-07: 1 mg via ORAL
  Filled 2016-09-07: qty 1

## 2016-09-07 MED ORDER — ONDANSETRON HCL 4 MG PO TABS
4.0000 mg | ORAL_TABLET | Freq: Three times a day (TID) | ORAL | Status: DC | PRN
  Administered 2016-09-07: 4 mg via ORAL
  Filled 2016-09-07: qty 1

## 2016-09-07 MED ORDER — ACETAMINOPHEN 325 MG PO TABS
650.0000 mg | ORAL_TABLET | ORAL | Status: DC | PRN
  Administered 2016-09-08: 650 mg via ORAL
  Filled 2016-09-07: qty 2

## 2016-09-07 MED ORDER — LORAZEPAM 1 MG PO TABS
1.0000 mg | ORAL_TABLET | Freq: Three times a day (TID) | ORAL | Status: DC | PRN
  Administered 2016-09-07: 1 mg via ORAL
  Filled 2016-09-07: qty 1

## 2016-09-07 MED ORDER — ALUM & MAG HYDROXIDE-SIMETH 200-200-20 MG/5ML PO SUSP: 30.0000 mL | ORAL | Status: DC | PRN
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
## 2016-09-07 NOTE — ED Notes (Signed)
Hourly rounding reveals patient sleeping in room. No complaints, stable, in no acute distress. Q15 minute rounds and monitoring via Security Cameras to continue. 

## 2016-09-07 NOTE — BH Assessment (Signed)
Writer informed the ER MD (Dr. Cook ) of the patient [name] to the facility.  [name] will complete the IVC so that the patient is able to be transported to the [facility].

## 2016-09-07 NOTE — ED Notes (Signed)
Social Worker at bedside.

## 2016-09-07 NOTE — BH Assessment (Signed)
[facility] Assessment Progress Note  [facility] Assessment Progress Note  Per Nanine Means, DNP, this pt requires psychiatric hospitalization at this time.  The following facilities have been contacted to seek placement for this pt, with results as noted:  Beds available, information sent, decision pending:  [facility]   At capacity:  [facility]   Doylene Canning, [place] Triage Specialist [phone]

## 2016-09-07 NOTE — ED Notes (Signed)
Patient is resting comfortably. Sitting up in bed.

## 2016-09-07 NOTE — ED Notes (Signed)
Pt oriented to room and unit.  Pt is calm and cooperative.  She is oriented x3.  Pt. Denies S/I, H/I, and AVH.  She is resting quietly in room.  Pt was reassured of her safety.  15 minute checks and video monitoring in place.

## 2016-09-07 NOTE — ED Notes (Signed)
Hourly rounding reveals patient in room. No complaints, stable, in no acute distress. Q15 minute rounds and monitoring via Security Cameras to continue.Snack and beverage given. 

## 2016-09-07 NOTE — ED Notes (Signed)
Hourly rounding reveals patient coloring in room. No complaints, stable, in no acute distress. Q15 minute rounds and monitoring via [employer] to continue.

## 2016-09-07 NOTE — BH Assessment (Signed)
Per Thayer Ohm at [facility] the patient is ccepted per Dr. Tonita Phoenix. The number to give report is [phone]. The patient will be going to the [facility].  Per Thayer Ohm the patient needs to be IVC and she is able to come this evening by 11pm.  If the patient cannot get there by 11pm then the patient can come at 7am.

## 2016-09-07 NOTE — ED Notes (Signed)
Hourly rounding reveals patient in room. No complaints, stable, in no acute distress. Q15 minute rounds and monitoring via Security Cameras to continue. 

## 2016-09-07 NOTE — ED Notes (Signed)
Bed: WA17 Expected date:  Expected time:  Means of arrival:  Comments: EMS: abd pain 

## 2016-09-07 NOTE — ED Provider Notes (Signed)
[facility] DEPT Provider Note   CSN: [id number] Arrival date & time: 09/07/16  1158     History   Chief Complaint Chief Complaint  Patient presents with  . Medical Clearance    HPI Kamarie Hamadou is a 21 y.o. female presenting via EMS because she didn't have milk for her cereal. She is not having any clear complaints. She reported feeling like she might be late for her period as she had intercourse with her husband. Making religious references and speaking in scattered thoughts. Inconsistent history provided. She is also stating suicidal ideation with a plan to take all her medications at once. Unable to obtain much more history. History was obtained in [place] as preferred language for patient.  HPI  Past Medical History:  Diagnosis Date  . Bipolar 1 disorder (HCC)   . Depression     Patient Active Problem List   Diagnosis Date Noted  . Bipolar I disorder, current or most recent episode manic, with psychotic features (HCC) 08/08/2016    History reviewed. No pertinent surgical history.  OB History    No data available       Home Medications    Prior to Admission medications   Medication Sig Start Date End Date Taking? Authorizing Provider  ARIPiprazole ER 400 MG SRER Inject 400 mg into the muscle every 28 (twenty-eight) days. 09/03/16   Shari Prows, MD  diphenhydrAMINE (BENADRYL) 25 mg capsule Take 1 tablet every 6 hours for 3 days, then as needed after that if rash returns 08/28/16   Elpidio Anis, PA-C  divalproex (DEPAKOTE) 500 MG DR tablet Take 1 tablet (500 mg total) by mouth every 12 (twelve) hours. 08/17/16   Shari Prows, MD  famotidine (PEPCID) 20 MG tablet Take twice daily for 3 days, then as needed after that if rash returns 08/28/16   Elpidio Anis, PA-C  OLANZapine (ZYPREXA) 15 MG tablet Take 1 tablet (15 mg total) by mouth at bedtime. 08/16/16   Jolanta B Pucilowska, MD  temazepam (RESTORIL) 30 MG capsule Take 1 capsule (30 mg total) by  mouth at bedtime. 08/16/16   Shari Prows, MD    Family History Family History  Problem Relation Age of Onset  . Hypertension Father     Social History Social History  Substance Use Topics  . Smoking status: Never Smoker  . Smokeless tobacco: Never Used  . Alcohol use No     Allergies   Peanut-containing drug products and Pork-derived products   Review of Systems Review of Systems  Unable to perform ROS: Psychiatric disorder     Physical Exam Updated Vital Signs BP 130/85 (BP Location: Left Wrist)   Pulse 98   Temp 98 F (36.7 C) (Oral)   Resp 16   Ht  (1.727 m)   Wt 72.6 kg   SpO2 100%   BMI 24.33 kg/m   Physical Exam  Constitutional: She appears well-developed and well-nourished. No distress.  Patient is afebrile, non-toxic appearing, seating comfortably in chair in no acute distress.  HENT:  Head: Normocephalic and atraumatic.  Eyes: Conjunctivae and EOM are normal. Right eye exhibits no discharge. Left eye exhibits no discharge.  Neck: Normal range of motion. Neck supple.  Cardiovascular: Normal rate, regular rhythm, normal heart sounds and intact distal pulses.   No murmur heard. Pulmonary/Chest: Effort normal and breath sounds normal. No respiratory distress. She has no wheezes. She has no rales. She exhibits no tenderness.  Abdominal: Soft. She exhibits no distension and  no mass. There is no tenderness. There is no rebound and no guarding.  Musculoskeletal: Normal range of motion. She exhibits no edema, tenderness or deformity.  Neurological: She is alert.  Patient was observed walking with normal stance and gait  Skin: Skin is warm and dry. No rash noted. She is not diaphoretic. No erythema. No pallor.  Psychiatric: She has a normal mood and affect.  Nursing note and vitals reviewed.    ED Treatments / Results  Labs (all labs ordered are listed, but only abnormal results are displayed) Labs Reviewed  COMPREHENSIVE METABOLIC PANEL -  Abnormal; Notable for the following:       Result Value   BUN 5 (*)    ALT 9 (*)    All other components within normal limits  ACETAMINOPHEN LEVEL - Abnormal; Notable for the following:    Acetaminophen (Tylenol), Serum <10 (*)    All other components within normal limits  VALPROIC ACID LEVEL - Abnormal; Notable for the following:    Valproic Acid Lvl 127 (*)    All other components within normal limits  CBC  ETHANOL  SALICYLATE LEVEL  PREGNANCY, URINE  URINALYSIS, ROUTINE W REFLEX MICROSCOPIC  RAPID URINE DRUG SCREEN, HOSP PERFORMED  HCG, SERUM, QUALITATIVE    EKG  EKG Interpretation None       Radiology No results found.  Procedures Procedures (including critical care time)  Medications Ordered in ED Medications  LORazepam (ATIVAN) tablet 1 mg (not administered)  acetaminophen (TYLENOL) tablet 650 mg (not administered)  ondansetron (ZOFRAN) tablet 4 mg (not administered)  alum & mag hydroxide-simeth (MAALOX/MYLANTA) 200-200-20 MG/5ML suspension 30 mL (not administered)  LORazepam (ATIVAN) tablet 1 mg (1 mg Oral Given 09/07/16 1441)     Initial Impression / Assessment and Plan / ED Course  I have reviewed the triage vital signs and the nursing notes.  Pertinent labs & imaging results that were available during my care of the patient were reviewed by me and considered in my medical decision making (see chart for details).     Patient presents via EMS and called because she didn't have milk for her cereals.  Unable to obtain clear history as patient is not answering questions clearly. She stated suicidal ideation with a plan to take her entire medications at once. TTS consulted Medical clearance labs ordered   Well-appearing, non-toxic afebrile with stable vital signs. She was calm and pleasant on both of my assessments. Nursing staff reported agitation and non-compliance with request to stay out of other patient's rooms.  Placed psych hold orders. Labs  unremarkable.   Final Clinical Impressions(s) / ED Diagnoses   Final diagnoses:  Suicidal ideation    New Prescriptions New Prescriptions   No medications on file     Georgiana Shore, Cordelia Poche 09/07/16 1606    Shaune Pollack, MD 09/08/16 1246

## 2016-09-07 NOTE — ED Notes (Signed)
Report to include Situation, Background, Assessment, and Recommendations received from RN. Patient alert and oriented, warm and dry, in no acute distress. Patient denies SI, HI, AVH and pain. Patient made aware of Q15 minute rounds and security cameras for their safety. Patient instructed to come to me with needs or concerns.  

## 2016-09-07 NOTE — ED Triage Notes (Addendum)
Patient in via EMS. EMS reports that patient called them because " she didn't have any milk for her cereal". Patient out at nurses station ripping up paper. Will not stay in room. Out at nurses station stating "Im looking for Zac".

## 2016-09-07 NOTE — ED Notes (Signed)
Pt opening and closing current in room. Pt notified she must keep current open.

## 2016-09-08 DIAGNOSIS — F250 Schizoaffective disorder, bipolar type: Secondary | ICD-10-CM | POA: Insufficient documentation

## 2016-09-08 NOTE — ED Notes (Signed)
Hourly rounding reveals patient sleeping in room. No complaints, stable, in no acute distress. Q15 minute rounds and monitoring via Security Cameras to continue. 

## 2016-09-08 NOTE — ED Notes (Signed)
Hourly rounding reveals patient walking in hall. No complaints, stable, in no acute distress. Q15 minute rounds and monitoring via Security Cameras to continue.  

## 2016-09-08 NOTE — ED Notes (Signed)
Pt. In shower. 

## 2017-05-14 ENCOUNTER — Inpatient Hospital Stay (HOSPITAL_COMMUNITY)
Admission: AD | Admit: 2017-05-14 | Discharge: 2017-05-14 | Disposition: A | Discharge status: Home / Self Care | Payer: Medicaid Other | Source: Ambulatory Visit | Attending: Obstetrics & Gynecology | Admitting: Obstetrics & Gynecology

## 2017-05-14 ENCOUNTER — Other Ambulatory Visit: Payer: Self-pay

## 2017-05-14 ENCOUNTER — Encounter (HOSPITAL_COMMUNITY): Payer: Self-pay | Admitting: *Deleted

## 2017-05-14 DIAGNOSIS — A084 Viral intestinal infection, unspecified: Secondary | ICD-10-CM | POA: Diagnosis not present

## 2017-05-14 DIAGNOSIS — Z3202 Encounter for pregnancy test, result negative: Secondary | ICD-10-CM | POA: Insufficient documentation

## 2017-05-14 DIAGNOSIS — R1110 Vomiting, unspecified: Secondary | ICD-10-CM | POA: Diagnosis present

## 2017-05-14 LAB — URINALYSIS, ROUTINE W REFLEX MICROSCOPIC
Bilirubin Urine: NEGATIVE
GLUCOSE, UA: NEGATIVE mg/dL
HGB URINE DIPSTICK: NEGATIVE
Ketones, ur: NEGATIVE mg/dL
Leukocytes, UA: NEGATIVE
Nitrite: NEGATIVE
Protein, ur: NEGATIVE mg/dL
SPECIFIC GRAVITY, URINE: 1.008 (ref 1.005–1.030)
pH: 7 (ref 5.0–8.0)

## 2017-05-14 LAB — POCT PREGNANCY, URINE: Preg Test, Ur: NEGATIVE

## 2017-05-14 MED ORDER — ONDANSETRON 8 MG PO TBDP: 8.0000 mg | ORAL_TABLET | Freq: Three times a day (TID) | ORAL | 0 refills | Status: DC | PRN

## 2017-05-14 MED ORDER — ONDANSETRON 8 MG PO TBDP
8.0000 mg | ORAL_TABLET | Freq: Once | ORAL | Status: AC
  Administered 2017-05-14: 8 mg via ORAL
  Filled 2017-05-14: qty 1

## 2017-05-14 NOTE — Discharge Instructions (Signed)
In late 2019, the [facility] will be moving to the [facility] campus. At that time, the MAU (Maternity Admissions Unit), where you are being seen today, will no longer take care of non-pregnant patients. We strongly encourage you to find a doctor's office before that time, so that you can be seen with any GYN concerns, like vaginal discharge, urinary tract infection, etc.. in a timely manner.  In order to make an office visit more convenient, the Center for [facility] at [facility] will be offering evening hours with same-day appointments, walk-in appointments and scheduled appointments available during this time.  Center for [facility] @ [facility] Hours: Monday - 8am - 7:30 pm with walk-in between 4pm- 7:30 pm Tuesday - 8 am - 5 pm (starting 09/03/17 we will be open late and accepting walk-ins from 4pm - 7:30pm) Wednesday - 8 am - 5 pm (starting 12/04/17 we will be open late and accepting walk-ins from 4pm - 7:30pm) Thursday 8 am - 5 pm (starting 03/06/18 we will be open late and accepting walk-ins from 4pm - 7:30pm) Friday 8 am - 5 pm  For an appointment please call the Center for [facility] @ [facility] at [phone]  For urgent needs, [facility] is also available for management of urgent GYN complaints such as vaginal discharge or urinary tract infections.     Primary care follow up  [facility] (will see you even if you do not have sickle cell): [phone] [facility]: [phone] [facility]: [phone]    Viral Gastroenteritis, Adult Viral gastroenteritis is also known as the stomach flu. This condition is caused by various viruses. These viruses can be passed from person to person very easily (are very contagious). This condition may affect your stomach, small intestine, and large intestine. It can cause sudden watery diarrhea, fever, and vomiting. Diarrhea and vomiting can make  you feel weak and cause you to become dehydrated. You may not be able to keep fluids down. Dehydration can make you tired and thirsty, cause you to have a dry mouth, and decrease how often you urinate. Older adults and people with other diseases or a weak immune system are at higher risk for dehydration. It is important to replace the fluids that you lose from diarrhea and vomiting. If you become severely dehydrated, you may need to get fluids through an IV tube. What are the causes? Gastroenteritis is caused by various viruses, including rotavirus and norovirus. Norovirus is the most common cause in adults. You can get sick by eating food, drinking water, or touching a surface contaminated with one of these viruses. You can also get sick from sharing utensils or other personal items with an infected person. What increases the risk? This condition is more likely to develop in people:  Who have a weak defense system (immune system).  Who live with one or more children who are younger than 21 years old.  Who live in a nursing home.  Who go on cruise ships.  What are the signs or symptoms? Symptoms of this condition start suddenly 1-2 days after exposure to a virus. Symptoms may last a few days or as long as a week. The most common symptoms are watery diarrhea and vomiting. Other symptoms include:  Fever.  Headache.  Fatigue.  Pain in the abdomen.  Chills.  Weakness.  Nausea.  Muscle aches.  Loss of appetite.  How is this diagnosed? This condition is diagnosed with  a medical history and physical exam. You may also have a stool test to check for viruses or other infections. How is this treated? This condition typically goes away on its own. The focus of treatment is to restore lost fluids (rehydration). Your health care provider may recommend that you take an oral rehydration solution (ORS) to replace important salts and minerals (electrolytes) in your body. Severe cases of this  condition may require giving fluids through an IV tube. Treatment may also include medicine to help with your symptoms. Follow these instructions at home: Follow instructions from your health care provider about how to care for yourself at home. Eating and drinking Follow these recommendations as told by your health care provider:  Take an ORS. This is a drink that is sold at pharmacies and retail stores.  Drink clear fluids in small amounts as you are able. Clear fluids include water, ice chips, diluted fruit juice, and low-calorie sports drinks.  Eat bland, easy-to-digest foods in small amounts as you are able. These foods include bananas, applesauce, rice, lean meats, toast, and crackers.  Avoid fluids that contain a lot of sugar or caffeine, such as energy drinks, sports drinks, and soda.  Avoid alcohol.  Avoid spicy or fatty foods.  General instructions   Drink enough fluid to keep your urine clear or pale yellow.  Wash your hands often. If soap and water are not available, use hand sanitizer.  Make sure that all people in your household wash their hands well and often.  Take over-the-counter and prescription medicines only as told by your health care provider.  Rest at home while you recover.  Watch your condition for any changes.  Take a warm bath to relieve any burning or pain from frequent diarrhea episodes.  Keep all follow-up visits as told by your health care provider. This is important. Contact a health care provider if:  You cannot keep fluids down.  Your symptoms get worse.  You have new symptoms.  You feel light-headed or dizzy.  You have muscle cramps. Get help right away if:  You have chest pain.  You feel extremely weak or you faint.  You see blood in your vomit.  Your vomit looks like coffee grounds.  You have bloody or black stools or stools that look like tar.  You have a severe headache, a stiff neck, or both.  You have a  rash.  You have severe pain, cramping, or bloating in your abdomen.  You have trouble breathing or you are breathing very quickly.  Your heart is beating very quickly.  Your skin feels cold and clammy.  You feel confused.  You have pain when you urinate.  You have signs of dehydration, such as: ? Dark urine, very little urine, or no urine. ? Cracked lips. ? Dry mouth. ? Sunken eyes. ? Sleepiness. ? Weakness. This information is not intended to replace advice given to you by your health care provider. Make sure you discuss any questions you have with your health care provider. Document Released: 05/21/2005 Document Revised: 11/02/2015 Document Reviewed: 01/25/2015 Elsevier Interactive Patient Education  2017 [employer]

## 2017-05-14 NOTE — MAU Provider Note (Signed)
History     CSN: [id number]  Arrival date and time: 05/14/17 1636   First Provider Initiated Contact with Patient 05/14/17 1739      Chief Complaint  Patient presents with  . Emesis   Barbara George is a 21 y.o. Who presents today with N/V/D. She is unsure if she is pregnant.   Emesis   This is a new problem. The current episode started yesterday. The problem occurs 2 to 4 times per day. The problem has been unchanged. The emesis has an appearance of stomach contents. There has been no fever. Associated symptoms include diarrhea and dizziness. Pertinent negatives include no chills or fever. She has tried nothing for the symptoms.    Past Medical History:  Diagnosis Date  . Bipolar 1 disorder (HCC)   . Depression     History reviewed. No pertinent surgical history.  Family History  Problem Relation Age of Onset  . Hypertension Father     Social History   Tobacco Use  . Smoking status: Never Smoker  . Smokeless tobacco: Never Used  Substance Use Topics  . Alcohol use: No    Alcohol/week: 0.0 oz  . Drug use: No    Allergies:  Allergies  Allergen Reactions  . Peanut-Containing Drug Products Nausea And Vomiting  . Pork-Derived Products Other (See Comments)    Religious purposes    Medications Prior to Admission  Medication Sig Dispense Refill Last Dose  . ARIPiprazole (ABILIFY) 10 MG tablet Take 10 mg by mouth at bedtime.    09/06/2016 at Unknown time  . ARIPiprazole ER 400 MG SRER Inject 400 mg into the muscle every 28 (twenty-eight) days. 1 each 0 more than a month ago  . diphenhydrAMINE (BENADRYL) 25 mg capsule Take 1 tablet every 6 hours for 3 days, then as needed after that if rash returns (Patient not taking: Reported on 09/07/2016) 30 capsule 0 Not Taking at Unknown time  . divalproex (DEPAKOTE) 500 MG DR tablet Take 1 tablet (500 mg total) by mouth every 12 (twelve) hours. 60 tablet 1 09/07/2016 at Unknown time  . famotidine (PEPCID) 20 MG tablet  Take twice daily for 3 days, then as needed after that if rash returns (Patient not taking: Reported on 09/07/2016) 30 tablet 0 Not Taking at Unknown time  . OLANZapine (ZYPREXA) 15 MG tablet Take 1 tablet (15 mg total) by mouth at bedtime. (Patient not taking: Reported on 09/07/2016) 30 tablet 1 Not Taking at Unknown time  . temazepam (RESTORIL) 30 MG capsule Take 1 capsule (30 mg total) by mouth at bedtime. (Patient not taking: Reported on 09/07/2016) 30 capsule 0 Not Taking at Unknown time    Review of Systems  Constitutional: Negative for chills and fever.  Gastrointestinal: Positive for diarrhea and vomiting.  Genitourinary: Negative for pelvic pain, vaginal bleeding and vaginal discharge.  Neurological: Positive for dizziness.   Physical Exam   Blood pressure 130/75, pulse (!) 108, temperature 98.4 F (36.9 C), temperature source Oral, resp. rate 18, height 5\' 8"  (1.727 m), weight 178 lb 12 oz (81.1 kg), last menstrual period 04/18/2017, SpO2 100 %.  Physical Exam  Nursing note and vitals reviewed. Constitutional: She is oriented to person, place, and time. She appears well-developed and well-nourished. No distress.  HENT:  Head: Normocephalic.  Cardiovascular: Normal rate.  Respiratory: Effort normal.  GI: Soft. There is no tenderness. There is no rebound.  Neurological: She is alert and oriented to person, place, and time.  Skin: Skin is  warm and dry.  Psychiatric: She has a normal mood and affect.     Results for orders placed or performed during the hospital encounter of 05/14/17 (from the past 24 hour(s))  Urinalysis, Routine w reflex microscopic     Status: Abnormal   Collection Time: 05/14/17  5:00 PM  Result Value Ref Range   Color, Urine STRAW (A) YELLOW   APPearance CLEAR CLEAR   Specific Gravity, Urine 1.008 1.005 - 1.030   pH 7.0 5.0 - 8.0   Glucose, UA NEGATIVE NEGATIVE mg/dL   Hgb urine dipstick NEGATIVE NEGATIVE   Bilirubin Urine NEGATIVE NEGATIVE   Ketones, ur  NEGATIVE NEGATIVE mg/dL   Protein, ur NEGATIVE NEGATIVE mg/dL   Nitrite NEGATIVE NEGATIVE   Leukocytes, UA NEGATIVE NEGATIVE  Pregnancy, urine POC     Status: None   Collection Time: 05/14/17  5:05 PM  Result Value Ref Range   Preg Test, Ur NEGATIVE NEGATIVE     MAU Course  Procedures  MDM   Assessment and Plan   1. Viral gastroenteritis    DC home Comfort measures reviewed  BRAT diet  RX: zofran PRN #20  Return to MAU as needed FU with OB as planned  Follow-up Information    Department, [facility].   Contact information: [address] [place] [address] [phone]           Thressa ShellerHeather Hogan 05/14/2017, 5:39 PM

## 2017-05-14 NOTE — MAU Note (Signed)
Pt presents with c/o vomiting since yesterday.  States she feels dizzy.  Unsure if pregnant, no UPT @ home.  LMP 04/18/2017.  Desires UPT.

## 2017-06-12 ENCOUNTER — Ambulatory Visit (INDEPENDENT_AMBULATORY_CARE_PROVIDER_SITE_OTHER): Payer: Self-pay | Admitting: Physician Assistant

## 2017-06-12 DIAGNOSIS — Z23 Encounter for immunization: Secondary | ICD-10-CM

## 2017-06-12 NOTE — Patient Instructions (Signed)
Please schedule a visit to have TB test done.

## 2017-06-12 NOTE — Progress Notes (Signed)
Flu Vaccine given.

## 2017-06-13 ENCOUNTER — Ambulatory Visit: Payer: Self-pay | Admitting: Physician Assistant

## 2017-06-16 ENCOUNTER — Emergency Department (HOSPITAL_COMMUNITY)
Admission: EM | Admit: 2017-06-16 | Discharge: 2017-06-16 | Disposition: A | Discharge status: Home / Self Care | Payer: Medicaid Other | Attending: Emergency Medicine | Admitting: Emergency Medicine

## 2017-06-16 ENCOUNTER — Encounter (HOSPITAL_COMMUNITY): Payer: Self-pay | Admitting: *Deleted

## 2017-06-16 ENCOUNTER — Other Ambulatory Visit: Payer: Self-pay

## 2017-06-16 DIAGNOSIS — R1110 Vomiting, unspecified: Secondary | ICD-10-CM

## 2017-06-16 DIAGNOSIS — Z79899 Other long term (current) drug therapy: Secondary | ICD-10-CM | POA: Diagnosis not present

## 2017-06-16 DIAGNOSIS — R112 Nausea with vomiting, unspecified: Secondary | ICD-10-CM | POA: Insufficient documentation

## 2017-06-16 LAB — URINALYSIS, ROUTINE W REFLEX MICROSCOPIC
Bilirubin Urine: NEGATIVE
Glucose, UA: NEGATIVE mg/dL
Hgb urine dipstick: NEGATIVE
Ketones, ur: NEGATIVE mg/dL
LEUKOCYTES UA: NEGATIVE
Nitrite: NEGATIVE
PROTEIN: NEGATIVE mg/dL
SPECIFIC GRAVITY, URINE: 1.018 (ref 1.005–1.030)
pH: 6 (ref 5.0–8.0)

## 2017-06-16 LAB — PREGNANCY, URINE: PREG TEST UR: NEGATIVE

## 2017-06-16 MED ORDER — ONDANSETRON HCL 4 MG PO TABS: 4.0000 mg | ORAL_TABLET | Freq: Once | ORAL | Status: DC

## 2017-06-16 MED ORDER — ONDANSETRON HCL 4 MG PO TABS: 4.0000 mg | ORAL_TABLET | Freq: Three times a day (TID) | ORAL | 0 refills | Status: DC | PRN

## 2017-06-16 NOTE — Discharge Instructions (Signed)
Return for any problem.  Please use Zofran for nausea.  If you do not have a period within the next 1-2 weeks please repeat a pregnancy test at home.

## 2017-06-16 NOTE — ED Provider Notes (Signed)
[facility] EMERGENCY DEPARTMENT Provider Note   CSN: [id number] Arrival date & time: 06/16/17  4098     History   Chief Complaint Chief Complaint  Patient presents with  . Emesis    HPI Barbara George is a 22 y.o. female.  22 year old female with history of bipolar and depression.  She reports that she has had intermittent nausea in the morning for the last several weeks.  She reports that her last period was on December 18.  She is concerned that she may be pregnant.  She does not use birth control.  She is sexually active.  She denies abdominal pain.  She denies vaginal bleeding or discharge.  She also complains of mild bilateral breast tenderness.  She is not yet late for her period this month.  He has not checked a pregnancy test at home.   The history is provided by the patient.  Emesis   This is a new problem. The current episode started more than 1 week ago. The problem occurs 2 to 4 times per day. The problem has not changed since onset.The emesis has an appearance of stomach contents. There has been no fever.    Past Medical History:  Diagnosis Date  . Bipolar 1 disorder (HCC)   . Depression     Patient Active Problem List   Diagnosis Date Noted  . Bipolar I disorder, current or most recent episode manic, with psychotic features (HCC) 08/08/2016    History reviewed. No pertinent surgical history.  OB History    No data available       Home Medications    Prior to Admission medications   Medication Sig Start Date End Date Taking? Authorizing Provider  ARIPiprazole (ABILIFY) 10 MG tablet Take 10 mg by mouth at bedtime.     [provider]  ARIPiprazole ER 400 MG SRER Inject 400 mg into the muscle every 28 (twenty-eight) days. 09/03/16   Pucilowska, Braulio Conte B, MD  diphenhydrAMINE (BENADRYL) 25 mg capsule Take 1 tablet every 6 hours for 3 days, then as needed after that if rash returns Patient not taking: Reported on  09/07/2016 08/28/16   Elpidio Anis, PA-C  divalproex (DEPAKOTE) 500 MG DR tablet Take 1 tablet (500 mg total) by mouth every 12 (twelve) hours. 08/17/16   Pucilowska, Ellin Goodie, MD  famotidine (PEPCID) 20 MG tablet Take twice daily for 3 days, then as needed after that if rash returns Patient not taking: Reported on 09/07/2016 08/28/16   Elpidio Anis, PA-C  OLANZapine (ZYPREXA) 15 MG tablet Take 1 tablet (15 mg total) by mouth at bedtime. Patient not taking: Reported on 09/07/2016 08/16/16   Pucilowska, Braulio Conte B, MD  ondansetron (ZOFRAN) 4 MG tablet Take 1 tablet (4 mg total) by mouth every 8 (eight) hours as needed for nausea or vomiting. 06/16/17   Wynetta Fines, MD  ondansetron (ZOFRAN-ODT) 8 MG disintegrating tablet Take 1 tablet (8 mg total) by mouth every 8 (eight) hours as needed for nausea or vomiting. 05/14/17   Thressa Sheller D, CNM  temazepam (RESTORIL) 30 MG capsule Take 1 capsule (30 mg total) by mouth at bedtime. Patient not taking: Reported on 09/07/2016 08/16/16   Shari Prows, MD    Family History Family History  Problem Relation Age of Onset  . Hypertension Father     Social History Social History   Tobacco Use  . Smoking status: Never Smoker  . Smokeless tobacco: Never Used  Substance Use Topics  .  Alcohol use: No    Alcohol/week: 0.0 oz  . Drug use: No     Allergies   Peanut-containing drug products and Pork-derived products   Review of Systems Review of Systems  Gastrointestinal: Positive for nausea and vomiting.  All other systems reviewed and are negative.    Physical Exam Updated Vital Signs BP 117/72   Pulse 94   Temp 98.9 F (37.2 C) (Oral)   Resp 15   Ht 5\' 8"  (1.727 m)   LMP 05/21/2017   SpO2 100%   BMI 27.18 kg/m   Physical Exam  Constitutional: She is oriented to person, place, and time. She appears well-developed and well-nourished. No distress.  HENT:  Head: Normocephalic and atraumatic.  Mouth/Throat: Oropharynx is clear  and moist.  Eyes: Conjunctivae and EOM are normal. Pupils are equal, round, and reactive to light.  Neck: Normal range of motion. Neck supple.  Cardiovascular: Normal rate, regular rhythm and normal heart sounds.  Pulmonary/Chest: Effort normal and breath sounds normal. No respiratory distress.  Abdominal: Soft. She exhibits no distension. There is no tenderness.  Musculoskeletal: Normal range of motion. She exhibits no edema or deformity.  Neurological: She is alert and oriented to person, place, and time.  Skin: Skin is warm and dry.  Psychiatric: She has a normal mood and affect.  Nursing note and vitals reviewed.    ED Treatments / Results  Labs (all labs ordered are listed, but only abnormal results are displayed) Labs Reviewed  PREGNANCY, URINE  URINALYSIS, ROUTINE W REFLEX MICROSCOPIC    EKG  EKG Interpretation None       Radiology No results found.  Procedures Procedures (including critical care time)  Medications Ordered in ED Medications  ondansetron (ZOFRAN) tablet 4 mg (not administered)     Initial Impression / Assessment and Plan / ED Course  I have reviewed the triage vital signs and the nursing notes.  Pertinent labs & imaging results that were available during my care of the patient were reviewed by me and considered in my medical decision making (see chart for details).     MDM screen complete  Patient is presenting with reported nausea and vomiting.  Symptoms are suggestive of an early pregnancy.  Patient has a negative pregnancy test today.  Admittedly, she is not yet late for her period this month.  She feels improved following a dose of Zofran.  She now desires discharge home.  She declines further workup in the ED.  She declines IV or labs.  She desires discharge home.  She is aware of need for close follow-up.  Strict return precautions given and understood.   Final Clinical Impressions(s) / ED Diagnoses   Final diagnoses:    Non-intractable vomiting, presence of nausea not specified, unspecified vomiting type    ED Discharge Orders        Ordered    ondansetron (ZOFRAN) 4 MG tablet  Every 8 hours PRN     06/16/17 0950       Wynetta FinesMessick, [place] Urieta C, MD 06/16/17 [phone]

## 2017-06-16 NOTE — ED Triage Notes (Signed)
PT reports vomiting  Mostly in the morning for several weeks . Pt also reports bil breast tender. Last menses 05-21-17. Pt reports it is possible she may be pregnant .

## 2017-06-16 NOTE — ED Notes (Signed)
Declined W/C at D/C and was escorted to lobby by RN. 

## 2017-06-17 ENCOUNTER — Encounter: Payer: Self-pay | Admitting: Physician Assistant

## 2017-06-17 ENCOUNTER — Ambulatory Visit (INDEPENDENT_AMBULATORY_CARE_PROVIDER_SITE_OTHER): Payer: Self-pay | Admitting: Physician Assistant

## 2017-06-17 ENCOUNTER — Other Ambulatory Visit: Payer: Self-pay

## 2017-06-17 VITALS — BP 110/62 | HR 86 | Temp 97.6°F | Resp 16 | Ht 68.0 in | Wt 187.0 lb

## 2017-06-17 DIAGNOSIS — Z111 Encounter for screening for respiratory tuberculosis: Secondary | ICD-10-CM

## 2017-06-17 NOTE — Progress Notes (Signed)
PRIMARY CARE AT [facility] [address] [place] [address] [phone]  Date:  06/17/2017   Name:  Barbara George   DOB:  26-Jun-1995   MRN:  [id number]  PCP:  Patient, No Pcp Per    History of Present Illness:  Barbara George is a 22 y.o. female patient who presents to PCP with  Chief Complaint  Patient presents with  . PPD Placement    pt neeeds to see a provider before tb placement     Patient is here today for placement of ppd.  She has completed the questionnaire.  She does not know of the bcg vaccination.  She has never had a tb test before today.   1. Yes  Were you born outside the [place] in one of the following parts of the world: [place], [place], [place], [place] or [place]?               2. Yes   Have you traveled outside the [place] and lived for more than one month in one of the following parts of the world: [place], [place], [place], [place] or [place]?               3. No Do you have a compromised immune system such as from any of the following conditions:HIV/AIDS, organ or bone marrow transplantation, diabetes, immunosuppressive medicines (e.g. Prednisone, Remicaide), leukemia, lymphoma, cancer of the head or neck, gastrectomy or jejunal bypass, end-stage renal disease (on dialysis), or silicosis?                           4. Yes  Have you ever or do you plan on working in: a residential care center, a health care facility, a jail or prison or homeless shelter?               5. No Have you ever: injected illegal drugs, used crack cocaine, lived in a homeless shelter  or been in jail or prison?                6. No Have you ever been exposed to anyone with infectious tuberculosis?  7. No Have you ever had a BCG vaccine? (BCG is a vaccine for tuberculosis  (TB) used in OTHER countries, NOT in the [place]).  8. No Have you ever been advised by a health care provider NOT to have a TB skin test?  9. No Have you  ever had a POSITIVE TB skin test?             IF SO, when?  n/a             IF SO, were you treated with INH? n/a             IF SO, where? n/a  Tuberculosis Symptom Questionnaire  Do you currently have any of the following symptoms?  1. No Unexplained cough lasting more than 3 weeks?   2. No Unexplained fever lasting more than 3 weeks.   3. No Night Sweats (sweating that leaves the bedclothes and sheets wet)                4. No Shortness of Breath   5. No Chest Pain   6. No Unintentional weight loss    7. No Unexplained fatigue (very tired for no reason)     Patient Active Problem List   Diagnosis Date Noted  .  Bipolar I disorder, current or most recent episode manic, with psychotic features (HCC) 08/08/2016    Past Medical History:  Diagnosis Date  . Bipolar 1 disorder (HCC)   . Depression     History reviewed. No pertinent surgical history.  Social History   Tobacco Use  . Smoking status: Never Smoker  . Smokeless tobacco: Never Used  Substance Use Topics  . Alcohol use: No    Alcohol/week: 0.0 oz  . Drug use: No    Family History  Problem Relation Age of Onset  . Hypertension Father     Allergies  Allergen Reactions  . Peanut-Containing Drug Products Nausea And Vomiting  . Pork-Derived Products Other (See Comments)    Religious purposes    Medication list has been reviewed and updated.  Current Outpatient Medications on File Prior to Visit  Medication Sig Dispense Refill  . ARIPiprazole ER 400 MG SRER Inject 400 mg into the muscle every 28 (twenty-eight) days. 1 each 0  . ARIPiprazole (ABILIFY) 10 MG tablet Take 10 mg by mouth at bedtime.     . diphenhydrAMINE (BENADRYL) 25 mg capsule Take 1 tablet every 6 hours for 3 days, then as needed after that if rash returns (Patient not taking: Reported on 09/07/2016) 30 capsule 0  . divalproex (DEPAKOTE) 500 MG DR tablet Take 1 tablet (500 mg total) by mouth every 12 (twelve) hours. (Patient  not taking: Reported on 06/17/2017) 60 tablet 1  . famotidine (PEPCID) 20 MG tablet Take twice daily for 3 days, then as needed after that if rash returns (Patient not taking: Reported on 09/07/2016) 30 tablet 0  . OLANZapine (ZYPREXA) 15 MG tablet Take 1 tablet (15 mg total) by mouth at bedtime. (Patient not taking: Reported on 09/07/2016) 30 tablet 1  . ondansetron (ZOFRAN) 4 MG tablet Take 1 tablet (4 mg total) by mouth every 8 (eight) hours as needed for nausea or vomiting. (Patient not taking: Reported on 06/17/2017) 4 tablet 0  . ondansetron (ZOFRAN-ODT) 8 MG disintegrating tablet Take 1 tablet (8 mg total) by mouth every 8 (eight) hours as needed for nausea or vomiting. (Patient not taking: Reported on 06/17/2017) 20 tablet 0  . temazepam (RESTORIL) 30 MG capsule Take 1 capsule (30 mg total) by mouth at bedtime. (Patient not taking: Reported on 09/07/2016) 30 capsule 0   No current facility-administered medications on file prior to visit.     ROS ROS otherwise unremarkable unless listed above.  Physical Examination: BP 110/62   Pulse 86   Temp 97.6 F (36.4 C) (Oral)   Resp 16   Ht 5\' 8"  (1.727 m)   Wt 187 lb (84.8 kg)   LMP 05/21/2017   SpO2 100%   BMI 28.43 kg/m  Ideal Body Weight: Weight in (lb) to have BMI = 25: 164.1  Physical Exam  Constitutional: She is oriented to person, place, and time. She appears well-developed and well-nourished. No distress.  HENT:  Head: Normocephalic and atraumatic.  Right Ear: External ear normal.  Left Ear: External ear normal.  Eyes: Conjunctivae and EOM are normal. Pupils are equal, round, and reactive to light.  Cardiovascular: Normal rate.  Pulmonary/Chest: Effort normal. No respiratory distress.  Neurological: She is alert and oriented to person, place, and time.  Skin: Skin is warm and dry. She is not diaphoretic.  No obvious marking or scar of prior bcg vaccination found on either arm  Psychiatric: She has a normal mood and affect. Her  behavior is normal.  Assessment and Plan: Barbara George is a 22 y.o. female who is here today for cc of  Chief Complaint  Patient presents with  . PPD Placement    pt neeeds to see a provider before tb placement  placed today, advised to return within the 48-72 hours.  Encounter for PPD skin test reading - Plan: TB Skin Test  Trena PlattStephanie Pearl Berlinger, PA-C [facility] 1/14/201911:43 AM \

## 2017-06-17 NOTE — Progress Notes (Deleted)
  Tuberculosis Risk Questionnaire   1. Yes Were you born outside the [place] in one of the following parts of the world: [place], [place], [place], [place] or [place]?    2. Yes Have you traveled outside the [place] and lived for more than one month in one of the following parts of the world: [place], [place], [place], [place] or [place]?    3. No Do you have a compromised immune system such as from any of the following conditions:HIV/AIDS, organ or bone marrow transplantation, diabetes, immunosuppressive medicines (e.g. Prednisone, Remicaide), leukemia, lymphoma, cancer of the head or neck, gastrectomy or jejunal bypass, end-stage renal disease (on dialysis), or silicosis?     4. Yes  Have you ever or do you plan on working in: a residential care center, a health care facility, a jail or prison or homeless shelter?    5. No Have you ever: injected illegal drugs, used crack cocaine, lived in a homeless shelter  or been in jail or prison?     6. No Have you ever been exposed to anyone with infectious tuberculosis?  7. No Have you ever had a BCG vaccine? (BCG is a vaccine for tuberculosis  (TB) used in OTHER countries, NOT in the [place]).  8. No Have you ever been advised by a health care provider NOT to have a TB skin test?  9. No Have you ever had a POSITIVE TB skin test?  IF SO, when? n/a  IF SO, were you treated with INH? n/a  IF SO, where? n/a  Tuberculosis Symptom Questionnaire  Do you currently have any of the following symptoms?  1. No Unexplained cough lasting more than 3 weeks?   2. No Unexplained fever lasting more than 3 weeks.   3. No Night Sweats (sweating that leaves the bedclothes and sheets wet)     4. No Shortness of Breath   5. No Chest Pain   6. No Unintentional weight loss    7. No Unexplained fatigue (very tired for no reason)   

## 2017-06-17 NOTE — Patient Instructions (Signed)
     IF you received an x-ray today, you will receive an invoice from [facility]. Please contact [facility] at [phone] with questions or concerns regarding your invoice.   IF you received labwork today, you will receive an invoice from [facility]. Please contact [facility] at [phone] with questions or concerns regarding your invoice.   Our billing staff will not be able to assist you with questions regarding bills from these companies.  You will be contacted with the lab results as soon as they are available. The fastest way to get your results is to activate your My Chart account. Instructions are located on the last page of this paperwork. If you have not heard from us regarding the results in 2 weeks, please contact this office.     

## 2017-06-20 ENCOUNTER — Ambulatory Visit: Payer: Self-pay | Admitting: Physician Assistant

## 2017-06-20 LAB — TB SKIN TEST
Induration: 0 mm
TB SKIN TEST: NEGATIVE

## 2017-08-24 ENCOUNTER — Ambulatory Visit (HOSPITAL_COMMUNITY)
Admission: EM | Admit: 2017-08-24 | Discharge: 2017-08-24 | Disposition: A | Discharge status: Home / Self Care | Payer: Medicaid Other | Attending: Family Medicine | Admitting: Family Medicine

## 2017-08-24 ENCOUNTER — Other Ambulatory Visit: Payer: Self-pay

## 2017-08-24 ENCOUNTER — Encounter (HOSPITAL_COMMUNITY): Payer: Self-pay | Admitting: *Deleted

## 2017-08-24 DIAGNOSIS — L739 Follicular disorder, unspecified: Secondary | ICD-10-CM

## 2017-08-24 DIAGNOSIS — N898 Other specified noninflammatory disorders of vagina: Secondary | ICD-10-CM | POA: Diagnosis present

## 2017-08-24 DIAGNOSIS — Z79899 Other long term (current) drug therapy: Secondary | ICD-10-CM | POA: Diagnosis not present

## 2017-08-24 DIAGNOSIS — F312 Bipolar disorder, current episode manic severe with psychotic features: Secondary | ICD-10-CM | POA: Diagnosis not present

## 2017-08-24 DIAGNOSIS — N926 Irregular menstruation, unspecified: Secondary | ICD-10-CM

## 2017-08-24 DIAGNOSIS — Z3202 Encounter for pregnancy test, result negative: Secondary | ICD-10-CM | POA: Diagnosis not present

## 2017-08-24 LAB — POCT URINALYSIS DIP (DEVICE)
BILIRUBIN URINE: NEGATIVE
GLUCOSE, UA: NEGATIVE mg/dL
Hgb urine dipstick: NEGATIVE
Leukocytes, UA: NEGATIVE
NITRITE: NEGATIVE
PH: 6 (ref 5.0–8.0)
Protein, ur: NEGATIVE mg/dL
Specific Gravity, Urine: >=1.03 (ref 1.005–1.030)
Urobilinogen, UA: 0.2 mg/dL (ref 0.0–1.0)

## 2017-08-24 LAB — POCT PREGNANCY, URINE: PREG TEST UR: NEGATIVE

## 2017-08-24 MED ORDER — CEPHALEXIN 500 MG PO CAPS: 500.0000 mg | ORAL_CAPSULE | Freq: Four times a day (QID) | ORAL | 0 refills | Status: AC

## 2017-08-24 NOTE — ED Triage Notes (Signed)
Rash on pelvis area and would like to do preg test, per pt she have not received her menstrual yet and it was suppose to be the 20th of this month

## 2017-08-24 NOTE — ED Provider Notes (Signed)
[facility]    CSN: [id number] Arrival date & time: 08/24/17  1545     History   Chief Complaint No chief complaint on file.   HPI Barbara George is a 22 y.o. female.   Barbara George presents with s/o with complaints of bumps to pelvic region which have been present for months now. Started at 1 but now there are 3. She does shave her pubic hair. States they are not painful, have not drained, does not itch, have not grown in size. She has some white vaginal discharge as well with slight odor. Without itchiness. Denies abdominal pain, back pain, urinary symptoms. States she is approximately 3 days late for expected period. LMP 2/20. She states she is typically fairly regular with her periods. She is trying for pregnancy. She is not on birth control. History of depression and bipolar.     ROS per HPI.      Past Medical History:  Diagnosis Date  . Bipolar 1 disorder (HCC)   . Depression     Patient Active Problem List   Diagnosis Date Noted  . Bipolar I disorder, current or most recent episode manic, with psychotic features (HCC) 08/08/2016    History reviewed. No pertinent surgical history.  OB History   None      Home Medications    Prior to Admission medications   Medication Sig Start Date End Date Taking? Authorizing Provider  ARIPiprazole ER 400 MG SRER Inject 400 mg into the muscle every 28 (twenty-eight) days. 09/03/16  Yes Pucilowska, Jolanta B, MD  cephALEXin (KEFLEX) 500 MG capsule Take 1 capsule (500 mg total) by mouth 4 (four) times daily for 7 days. 08/24/17 08/31/17  Georgetta HaberBurky, Natalie B, NP    Family History Family History  Problem Relation Age of Onset  . Hypertension Father     Social History Social History   Tobacco Use  . Smoking status: Never Smoker  . Smokeless tobacco: Never Used  Substance Use Topics  . Alcohol use: No    Alcohol/week: 0.0 oz  . Drug use: No     Allergies   Peanut-containing drug products and  Pork-derived products   Review of Systems Review of Systems   Physical Exam Triage Vital Signs ED Triage Vitals [08/24/17 1641]  Enc Vitals Group     BP (!) 124/54     Pulse Rate 80     Resp      Temp 98.1 F (36.7 C)     Temp Source Oral     SpO2 99 %     Weight      Height      Head Circumference      Peak Flow      Pain Score      Pain Loc      Pain Edu?      Excl. in GC?    No data found.  Updated Vital Signs BP (!) 124/54 (BP Location: Left Arm)   Pulse 80   Temp 98.1 F (36.7 C) (Oral)   LMP 07/24/2017 (Exact Date)   SpO2 99%   Visual Acuity Right Eye Distance:   Left Eye Distance:   Bilateral Distance:    Right Eye Near:   Left Eye Near:    Bilateral Near:     Physical Exam  Constitutional: She is oriented to person, place, and time. She appears well-developed and well-nourished. No distress.  Cardiovascular: Normal rate, regular rhythm and normal heart sounds.  Pulmonary/Chest: Effort normal  and breath sounds normal.  Abdominal: Soft. She exhibits no distension. There is no tenderness. There is no guarding.    Three raised hair follicles, mildly firm. Without redness, drainage or fluctuance  Genitourinary:  Genitourinary Comments: External exam completed with thin white vaginal discharge visualized.   Neurological: She is alert and oriented to person, place, and time.  Skin: Skin is warm and dry.     UC Treatments / Results  Labs (all labs ordered are listed, but only abnormal results are displayed) Labs Reviewed  POCT URINALYSIS DIP (DEVICE) - Abnormal; Notable for the following components:      Result Value   Ketones, ur TRACE (*)    All other components within normal limits  POCT PREGNANCY, URINE  URINE CYTOLOGY ANCILLARY ONLY    EKG None Radiology No results found.  Procedures Procedures (including critical care time)  Medications Ordered in UC Medications - No data to display   Initial Impression / Assessment and Plan /  UC Course  I have reviewed the triage vital signs and the nursing notes.  Pertinent labs & imaging results that were available during my care of the patient were reviewed by me and considered in my medical decision making (see chart for details).     Will treat folliculitis with keflex at this time. Warm compresses as likely ingrown hair related to shaving. Negative pregnancy today. Urine cytology pending. Encouraged follow up with ob/gyn due to irregular periods while trying for pregnancy. Patient verbalized understanding and agreeable to plan.    Final Clinical Impressions(s) / UC Diagnoses   Final diagnoses:  Folliculitis  Encounter for pregnancy test with result negative  Irregular periods    ED Discharge Orders        Ordered    cephALEXin (KEFLEX) 500 MG capsule  4 times daily     08/24/17 1712       Controlled Substance Prescriptions [place] Controlled Substance Registry consulted? Not Applicable   Georgetta Haber, NP 08/24/17 [phone]

## 2017-08-24 NOTE — Discharge Instructions (Addendum)
Warm compresses to bumps.  Complete course of antibiotics.  Please follow up with gynecology if periods continue to be irregular/late. We are testing you for vaginal yeast and bacteria, will call if this returns positive.

## 2017-08-26 LAB — URINE CYTOLOGY ANCILLARY ONLY
Chlamydia: NEGATIVE
Neisseria Gonorrhea: NEGATIVE
Trichomonas: NEGATIVE

## 2017-08-27 LAB — URINE CYTOLOGY ANCILLARY ONLY: CANDIDA VAGINITIS: NEGATIVE

## 2017-08-31 ENCOUNTER — Inpatient Hospital Stay (HOSPITAL_COMMUNITY)
Admission: AD | Admit: 2017-08-31 | Discharge: 2017-08-31 | Disposition: A | Discharge status: Home / Self Care | Payer: Medicaid Other | Source: Ambulatory Visit | Attending: Obstetrics & Gynecology | Admitting: Obstetrics & Gynecology

## 2017-08-31 DIAGNOSIS — Z539 Procedure and treatment not carried out, unspecified reason: Secondary | ICD-10-CM | POA: Insufficient documentation

## 2017-08-31 NOTE — MAU Note (Signed)
[facility] Saturday and they advised pt to come to clinic for follow up. Unsure about pregnancy status. No pain, no bleeding, no discharge.

## 2017-09-03 ENCOUNTER — Encounter: Payer: Self-pay | Admitting: Physician Assistant

## 2017-09-03 ENCOUNTER — Telehealth (HOSPITAL_COMMUNITY): Payer: Self-pay

## 2017-09-03 NOTE — Telephone Encounter (Signed)
Pt returned phone call, denies continued vaginal itching or discharge. Pt given results from recent visit.

## 2017-09-03 NOTE — Telephone Encounter (Signed)
Attempted to reach patient regarding results from recent visit and assess the need for new prescriptions. No answer at this time. Message left encouraging patient to call back.

## 2017-09-11 ENCOUNTER — Ambulatory Visit (HOSPITAL_COMMUNITY)
Admission: EM | Admit: 2017-09-11 | Discharge: 2017-09-11 | Disposition: A | Discharge status: Home / Self Care | Payer: Medicaid Other | Attending: Family Medicine | Admitting: Family Medicine

## 2017-09-11 ENCOUNTER — Encounter (HOSPITAL_COMMUNITY): Payer: Self-pay | Admitting: Emergency Medicine

## 2017-09-11 ENCOUNTER — Other Ambulatory Visit: Payer: Self-pay

## 2017-09-11 DIAGNOSIS — R51 Headache: Secondary | ICD-10-CM | POA: Diagnosis not present

## 2017-09-11 DIAGNOSIS — R112 Nausea with vomiting, unspecified: Secondary | ICD-10-CM | POA: Diagnosis not present

## 2017-09-11 DIAGNOSIS — Z91010 Allergy to peanuts: Secondary | ICD-10-CM | POA: Diagnosis not present

## 2017-09-11 DIAGNOSIS — Z3202 Encounter for pregnancy test, result negative: Secondary | ICD-10-CM

## 2017-09-11 DIAGNOSIS — R109 Unspecified abdominal pain: Secondary | ICD-10-CM | POA: Diagnosis not present

## 2017-09-11 DIAGNOSIS — R519 Headache, unspecified: Secondary | ICD-10-CM

## 2017-09-11 LAB — POCT PREGNANCY, URINE: PREG TEST UR: NEGATIVE

## 2017-09-11 LAB — POCT URINALYSIS DIP (DEVICE)
Bilirubin Urine: NEGATIVE
Glucose, UA: NEGATIVE mg/dL
KETONES UR: NEGATIVE mg/dL
Leukocytes, UA: NEGATIVE
Nitrite: NEGATIVE
PH: 8.5 — AB (ref 5.0–8.0)
PROTEIN: 30 mg/dL — AB
Specific Gravity, Urine: 1.015 (ref 1.005–1.030)
UROBILINOGEN UA: 1 mg/dL (ref 0.0–1.0)

## 2017-09-11 MED ORDER — ONDANSETRON 8 MG PO TBDP: 8.0000 mg | ORAL_TABLET | Freq: Three times a day (TID) | ORAL | 0 refills | Status: DC | PRN

## 2017-09-11 NOTE — ED Triage Notes (Signed)
Pt c/o nausea, vomiting, fever x2 days. Did not take temp, just felt hot. Headache as well.

## 2017-09-11 NOTE — Discharge Instructions (Signed)
Hydrate well with at least 2 liters (1 gallon) of water daily.  °

## 2017-09-11 NOTE — ED Provider Notes (Signed)
  MRN: [id number] DOB: 1996-05-04  Subjective:   Barbara George is a 22 y.o. female presenting for 2-day history of nausea with vomiting.  Has had 2 episodes of vomiting today.  Has also had generalized headaches.  Has not tried any medications for relief.  She is still tolerating her foods and liquids.  Denies decreased appetite.  Denies fever, hematemesis, diarrhea, constipation, bloody stools.  Denies history of GI issues.  Denies dysuria, urinary frequency, hematuria.  Patient is currently on her cycle.  Patient is sexually active, does not use condoms for protection.  No current facility-administered medications for this encounter.   Current Outpatient Medications:  .  FOLIC ACID PO, Take by mouth., Disp: , Rfl:  .  ARIPiprazole ER 400 MG SRER, Inject 400 mg into the muscle every 28 (twenty-eight) days., Disp: 1 each, Rfl: 0    Allergies  Allergen Reactions  . Peanut-Containing Drug Products Nausea And Vomiting  . Pork-Derived Products Other (See Comments)    Religious purposes    Past Medical History:  Diagnosis Date  . Bipolar 1 disorder (HCC)   . Depression      History reviewed. No pertinent surgical history.  Objective:   Vitals: BP 131/84   Pulse (!) 109   Temp 98.8 F (37.1 C)   Resp 18   LMP 09/11/2017   SpO2 100%   Physical Exam  Constitutional: She is oriented to person, place, and time. She appears well-developed and well-nourished.  HENT:  Mouth/Throat: Oropharynx is clear and moist.  Cardiovascular: Normal rate, regular rhythm and intact distal pulses. Exam reveals no gallop and no friction rub.  No murmur heard. Pulmonary/Chest: No respiratory distress. She has no wheezes. She has no rales.  Abdominal: Soft. Bowel sounds are normal. She exhibits no distension and no mass. There is tenderness (generalized throughout). There is no rebound and no guarding.  Musculoskeletal: She exhibits no edema.  Neurological: She is alert and oriented to  person, place, and time.  Skin: Skin is warm and dry. No rash noted. No erythema. No pallor.  Psychiatric: She has a normal mood and affect.   Results for orders placed or performed during the hospital encounter of 09/11/17 (from the past 24 hour(s))  POCT urinalysis dip (device)     Status: Abnormal   Collection Time: 09/11/17  5:17 PM  Result Value Ref Range   Glucose, UA NEGATIVE NEGATIVE mg/dL   Bilirubin Urine NEGATIVE NEGATIVE   Ketones, ur NEGATIVE NEGATIVE mg/dL   Specific Gravity, Urine 1.015 1.005 - 1.030   Hgb urine dipstick LARGE (A) NEGATIVE   pH 8.5 (H) 5.0 - 8.0   Protein, ur 30 (A) NEGATIVE mg/dL   Urobilinogen, UA 1.0 0.0 - 1.0 mg/dL   Nitrite NEGATIVE NEGATIVE   Leukocytes, UA NEGATIVE NEGATIVE  Pregnancy, urine POC     Status: None   Collection Time: 09/11/17  5:20 PM  Result Value Ref Range   Preg Test, Ur NEGATIVE NEGATIVE    Assessment and Plan :   Nausea and vomiting, intractability of vomiting not specified, unspecified vomiting type  Belly pain  Generalized headaches  Urine culture and STI testing pending.  We will manage supportively for gastroenteritis.  Patient is to use Zofran and hydrate very well, eat light meals. Return-to-clinic precautions discussed, patient verbalized understanding.    Wallis BambergMani, Emmarie Sannes, [place]PA-C 09/11/17 [phone]

## 2017-09-12 ENCOUNTER — Telehealth (HOSPITAL_COMMUNITY): Payer: Self-pay

## 2017-09-12 LAB — URINE CYTOLOGY ANCILLARY ONLY
CHLAMYDIA, DNA PROBE: NEGATIVE
NEISSERIA GONORRHEA: NEGATIVE
TRICH (WINDOWPATH): NEGATIVE

## 2017-09-12 LAB — URINE CULTURE

## 2017-09-12 NOTE — Telephone Encounter (Signed)
Results are within normal range. Pt contacted and made aware. Verbalized understanding.  Urine culture does not clearly demonstrate a UTI.  Pt contacted and made aware. Educated on other possible causes of urinary discomfort include chafing; irritation from hygiene product; other pelvic infection (yeast, bacterial vaginosis) or STD; occasional dietary cause (caffeine); bowel issue (constipation); low estrogen effect; kidney stone passage; or interstitial cystitis.  Recheck or followup with your primary care provider for further evaluation if symptoms are not improving. Pt verbalized understanding.

## 2017-11-13 ENCOUNTER — Encounter (HOSPITAL_COMMUNITY): Payer: Self-pay | Admitting: Emergency Medicine

## 2017-11-13 ENCOUNTER — Other Ambulatory Visit: Payer: Self-pay

## 2017-11-13 ENCOUNTER — Ambulatory Visit (HOSPITAL_COMMUNITY)
Admission: EM | Admit: 2017-11-13 | Discharge: 2017-11-13 | Disposition: A | Discharge status: Home / Self Care | Payer: Medicaid Other | Attending: Family Medicine | Admitting: Family Medicine

## 2017-11-13 DIAGNOSIS — T22211A Burn of second degree of right forearm, initial encounter: Secondary | ICD-10-CM

## 2017-11-13 DIAGNOSIS — T22212A Burn of second degree of left forearm, initial encounter: Secondary | ICD-10-CM

## 2017-11-13 MED ORDER — SILVER SULFADIAZINE 1 % EX CREA
TOPICAL_CREAM | Freq: Once | CUTANEOUS | Status: AC
  Administered 2017-11-13: 20:00:00 via TOPICAL
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
## 2017-11-13 NOTE — ED Notes (Signed)
Bed: UC01 Expected date:  Expected time:  Means of arrival:  Comments: For appts 
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
## 2017-11-15 ENCOUNTER — Telehealth (HOSPITAL_COMMUNITY): Payer: Self-pay | Admitting: Emergency Medicine

## 2017-11-15 MED ORDER — SILVER SULFADIAZINE 1 % EX CREA: 1.0000 "application " | TOPICAL_CREAM | Freq: Every day | CUTANEOUS | 0 refills | Status: DC
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
## 2017-12-15 ENCOUNTER — Encounter (HOSPITAL_COMMUNITY): Payer: Self-pay | Admitting: Emergency Medicine

## 2017-12-15 ENCOUNTER — Ambulatory Visit (HOSPITAL_COMMUNITY)
Admission: EM | Admit: 2017-12-15 | Discharge: 2017-12-15 | Disposition: A | Discharge status: Home / Self Care | Payer: Medicaid Other | Attending: Internal Medicine | Admitting: Internal Medicine

## 2017-12-15 DIAGNOSIS — R51 Headache: Secondary | ICD-10-CM

## 2017-12-15 DIAGNOSIS — R1084 Generalized abdominal pain: Secondary | ICD-10-CM | POA: Diagnosis not present

## 2017-12-15 DIAGNOSIS — R519 Headache, unspecified: Secondary | ICD-10-CM

## 2017-12-15 LAB — POCT URINALYSIS DIP (DEVICE)
Bilirubin Urine: NEGATIVE
Glucose, UA: NEGATIVE mg/dL
KETONES UR: NEGATIVE mg/dL
Leukocytes, UA: NEGATIVE
NITRITE: NEGATIVE
PH: 6 (ref 5.0–8.0)
Protein, ur: NEGATIVE mg/dL
Specific Gravity, Urine: <=1.005 (ref 1.005–1.030)
Urobilinogen, UA: 0.2 mg/dL (ref 0.0–1.0)

## 2017-12-15 LAB — POCT PREGNANCY, URINE: PREG TEST UR: NEGATIVE

## 2017-12-15 MED ORDER — SIMETHICONE 80 MG PO CHEW: 80.0000 mg | CHEWABLE_TABLET | Freq: Four times a day (QID) | ORAL | 0 refills | Status: DC | PRN

## 2017-12-15 MED ORDER — ONDANSETRON 4 MG PO TBDP
ORAL_TABLET | ORAL | Status: AC
  Filled 2017-12-15: qty 1

## 2017-12-15 MED ORDER — KETOROLAC TROMETHAMINE 60 MG/2ML IM SOLN
60.0000 mg | Freq: Once | INTRAMUSCULAR | Status: AC
  Administered 2017-12-15: 60 mg via INTRAMUSCULAR

## 2017-12-15 MED ORDER — KETOROLAC TROMETHAMINE 60 MG/2ML IM SOLN
INTRAMUSCULAR | Status: AC
  Filled 2017-12-15: qty 2

## 2017-12-15 MED ORDER — ACETAMINOPHEN 500 MG PO TABS: 500.0000 mg | ORAL_TABLET | Freq: Four times a day (QID) | ORAL | 0 refills | Status: DC | PRN

## 2017-12-15 MED ORDER — ONDANSETRON HCL 4 MG PO TABS: 4.0000 mg | ORAL_TABLET | Freq: Three times a day (TID) | ORAL | 0 refills | Status: DC | PRN

## 2017-12-15 MED ORDER — ONDANSETRON 4 MG PO TBDP
4.0000 mg | ORAL_TABLET | Freq: Once | ORAL | Status: AC
  Administered 2017-12-15: 4 mg via ORAL

## 2017-12-15 NOTE — ED Provider Notes (Signed)
[facility]    CSN: [id number] Arrival date & time: 12/15/17  1518     History   Chief Complaint Chief Complaint  Patient presents with  . Abdominal Pain  . Headache    HPI Barbara George is a 22 y.o. female.   Barbara George presents with complaints of general fatigue abdominal bloating and mild headache. Mild nausea, no vomiting. Has been able to eat and drink today. Had a BM today which was normal for her. Frontal and temporal headache. Occasionally dizzy. No light or sound sensitivity. No worse with movement. Denies previous similar. No urinary symptoms. LMP 6/25, sexually active and not on birth control. Hx of bipolar and depression. Per chart review was seen with very similar presentation 4/10. Was seen for pregnancy test 3/23 and had stated was trying for pregnancy at that time as well. Has not taken any medications for symptoms.    ROS per HPI.      Past Medical History:  Diagnosis Date  . Bipolar 1 disorder (HCC)   . Depression     Patient Active Problem List   Diagnosis Date Noted  . Bipolar I disorder, current or most recent episode manic, with psychotic features (HCC) 08/08/2016    History reviewed. No pertinent surgical history.  OB History   None      Home Medications    Prior to Admission medications   Medication Sig Start Date End Date Taking? Authorizing Provider  acetaminophen (TYLENOL) 500 MG tablet Take 1 tablet (500 mg total) by mouth every 6 (six) hours as needed. 12/15/17   Georgetta HaberBurky, Sontee Desena B, NP  ARIPiprazole ER 400 MG SRER Inject 400 mg into the muscle every 28 (twenty-eight) days. 09/03/16   Pucilowska, Braulio ConteJolanta B, MD  FOLIC ACID PO Take by mouth.    [provider]  ondansetron (ZOFRAN) 4 MG tablet Take 1 tablet (4 mg total) by mouth every 8 (eight) hours as needed for nausea or vomiting. 12/15/17   Georgetta HaberBurky, Amdrew Oboyle B, NP  simethicone (MYLICON) 80 MG chewable tablet Chew 1 tablet (80 mg total) by mouth every 6 (six)  hours as needed (bloating). 12/15/17   Georgetta HaberBurky, Ladon Vandenberghe B, NP    Family History Family History  Problem Relation Age of Onset  . Hypertension Father     Social History Social History   Tobacco Use  . Smoking status: Never Smoker  . Smokeless tobacco: Never Used  Substance Use Topics  . Alcohol use: No    Alcohol/week: 0.0 oz  . Drug use: No     Allergies   Peanut-containing drug products and Pork-derived products   Review of Systems Review of Systems   Physical Exam Triage Vital Signs ED Triage Vitals [12/15/17 1606]  Enc Vitals Group     BP 123/78     Pulse Rate 90     Resp 16     Temp 97.9 F (36.6 C)     Temp src      SpO2 100 %     Weight      Height      Head Circumference      Peak Flow      Pain Score      Pain Loc      Pain Edu?      Excl. in GC?    No data found.  Updated Vital Signs BP 123/78   Pulse 90   Temp 97.9 F (36.6 C)   Resp 16   LMP 11/26/2017  SpO2 100%    Physical Exam  Constitutional: She is oriented to person, place, and time. She appears well-developed and well-nourished. No distress.  Cardiovascular: Normal rate, regular rhythm and normal heart sounds.  Pulmonary/Chest: Effort normal and breath sounds normal.  Abdominal: Soft. Bowel sounds are normal. She exhibits distension. There is no hepatosplenomegaly, splenomegaly or hepatomegaly. There is generalized tenderness. There is no rigidity, no rebound, no guarding, no CVA tenderness, no tenderness at McBurney's point and negative Murphy's sign.  Neurological: She is alert and oriented to person, place, and time. She has normal strength. She is not disoriented. No cranial nerve deficit or sensory deficit. GCS eye subscore is 4. GCS verbal subscore is 5. GCS motor subscore is 6.  Skin: Skin is warm and dry.     UC Treatments / Results  Labs (all labs ordered are listed, but only abnormal results are displayed) Labs Reviewed  POCT URINALYSIS DIP (DEVICE) - Abnormal;  Notable for the following components:      Result Value   Hgb urine dipstick TRACE (*)    All other components within normal limits  POCT PREGNANCY, URINE    EKG None  Radiology No results found.  Procedures Procedures (including critical care time)  Medications Ordered in UC Medications  ondansetron (ZOFRAN-ODT) disintegrating tablet 4 mg (has no administration in time range)  ketorolac (TORADOL) injection 60 mg (has no administration in time range)    Initial Impression / Assessment and Plan / UC Course  I have reviewed the triage vital signs and the nursing notes.  Pertinent labs & imaging results that were available during my care of the patient were reviewed by me and considered in my medical decision making (see chart for details).     Non specific exam, no red flag findings. Supportive cares recommended at this time. Encouraged follow up with pcp for long term management as it seems that patient has been seen with similar complaints in the past. Return precautions provided. Patient verbalized understanding and agreeable to plan.    Final Clinical Impressions(s) / UC Diagnoses   Final diagnoses:  Generalized abdominal pain  Acute nonintractable headache, unspecified headache type     Discharge Instructions     Urine is negative for infection, negative for pregnancy.  Zofran as needed for nausea.  Mylicon tab as needed for bloating.  Tylenol as needed for headache. Please establish with a primary care provider for further evaluation of these symptoms.  If concern about pregnancy please follow up with Ob/Gyn.     ED Prescriptions    Medication Sig Dispense Auth. Provider   ondansetron (ZOFRAN) 4 MG tablet Take 1 tablet (4 mg total) by mouth every 8 (eight) hours as needed for nausea or vomiting. 10 tablet Linus Mako B, NP   simethicone (MYLICON) 80 MG chewable tablet Chew 1 tablet (80 mg total) by mouth every 6 (six) hours as needed (bloating). 30 tablet  Linus Mako B, NP   acetaminophen (TYLENOL) 500 MG tablet Take 1 tablet (500 mg total) by mouth every 6 (six) hours as needed. 30 tablet Georgetta Haber, NP     Controlled Substance Prescriptions [place] Controlled Substance Registry consulted? Not Applicable   Georgetta Haber, NP 12/15/17 [phone]

## 2017-12-15 NOTE — Discharge Instructions (Addendum)
Urine is negative for infection, negative for pregnancy.  Zofran as needed for nausea.  Mylicon tab as needed for bloating.  Tylenol as needed for headache. Please establish with a primary care provider for further evaluation of these symptoms.  If concern about pregnancy please follow up with Ob/Gyn.

## 2017-12-15 NOTE — ED Triage Notes (Signed)
Pt c/o generalized abdominal pain and headache just today. Pt tried to vomit but nothing came out.

## 2018-03-15 ENCOUNTER — Other Ambulatory Visit: Payer: Self-pay

## 2018-03-15 ENCOUNTER — Ambulatory Visit (HOSPITAL_COMMUNITY)
Admission: EM | Admit: 2018-03-15 | Discharge: 2018-03-15 | Disposition: A | Discharge status: Home / Self Care | Payer: Medicaid Other

## 2018-03-15 ENCOUNTER — Encounter (HOSPITAL_COMMUNITY): Payer: Self-pay

## 2018-03-15 DIAGNOSIS — N939 Abnormal uterine and vaginal bleeding, unspecified: Secondary | ICD-10-CM

## 2018-03-15 DIAGNOSIS — N925 Other specified irregular menstruation: Secondary | ICD-10-CM | POA: Diagnosis not present

## 2018-03-15 DIAGNOSIS — R109 Unspecified abdominal pain: Secondary | ICD-10-CM | POA: Diagnosis not present

## 2018-03-15 DIAGNOSIS — Z3202 Encounter for pregnancy test, result negative: Secondary | ICD-10-CM

## 2018-03-15 LAB — POCT URINALYSIS DIP (DEVICE)
Bilirubin Urine: NEGATIVE
Glucose, UA: NEGATIVE mg/dL
Ketones, ur: NEGATIVE mg/dL
Leukocytes, UA: NEGATIVE
Nitrite: NEGATIVE
PROTEIN: NEGATIVE mg/dL
SPECIFIC GRAVITY, URINE: 1.02 (ref 1.005–1.030)
UROBILINOGEN UA: 0.2 mg/dL (ref 0.0–1.0)
pH: 6.5 (ref 5.0–8.0)

## 2018-03-15 LAB — POCT PREGNANCY, URINE: Preg Test, Ur: NEGATIVE

## 2018-03-15 NOTE — ED Provider Notes (Signed)
03/15/2018 12:42 PM   DOB: Feb 04, 1996 / MRN: [id number]  SUBJECTIVE:  Barbara George is a 22 y.o. female presenting for late menstrual cycle that started about 2 weeks ago.  Assoicates abdominal cramping and mild bloody vaginal discharge.  Has tried nothing. Periods are typically irregular.  She has not taken a pregnancy test at home.    She is allergic to peanut-containing drug products and pork-derived products.   She  has a past medical history of Bipolar 1 disorder (HCC) and Depression.    She  reports that she has never smoked. She has never used smokeless tobacco. She reports that she does not drink alcohol or use drugs. She  reports that she currently engages in sexual activity. The patient  has no past surgical history on file.  Her family history includes Hypertension in her father.  Review of Systems  Constitutional: Negative for chills, diaphoresis and fever.  Gastrointestinal: Positive for abdominal pain. Negative for blood in stool, constipation, diarrhea, heartburn, melena, nausea and vomiting.  Genitourinary: Negative.   Skin: Negative for rash.  Neurological: Negative for dizziness.    OBJECTIVE:  BP 122/77 (BP Location: Left Arm)   Pulse 87   Temp 98.2 F (36.8 C) (Oral)   Resp 17   LMP  (LMP Unknown)   SpO2 100%   Wt Readings from Last 3 Encounters:  08/31/17 197 lb (89.4 kg)  06/17/17 187 lb (84.8 kg)  05/14/17 178 lb 12 oz (81.1 kg)   Temp Readings from Last 3 Encounters:  03/15/18 98.2 F (36.8 C) (Oral)  12/15/17 97.9 F (36.6 C)  11/13/17 98.2 F (36.8 C) (Oral)   BP Readings from Last 3 Encounters:  03/15/18 122/77  12/15/17 123/78  11/13/17 105/70   Pulse Readings from Last 3 Encounters:  03/15/18 87  12/15/17 90  11/13/17 85    Physical Exam  Constitutional: She is oriented to person, place, and time. She appears well-nourished. No distress.  Eyes: Pupils are equal, round, and reactive to light. EOM are normal.   Cardiovascular: Normal rate.  Pulmonary/Chest: Effort normal.  Abdominal: She exhibits no distension and no mass. There is no tenderness. There is no rebound and no guarding. No hernia.  Neurological: She is alert and oriented to person, place, and time. No cranial nerve deficit. Gait normal.  Skin: Skin is dry. She is not diaphoretic.  Psychiatric: She has a normal mood and affect.  Vitals reviewed.   Results for orders placed or performed during the hospital encounter of 03/15/18 (from the past 72 hour(s))  POCT urinalysis dip (device)     Status: Abnormal   Collection Time: 03/15/18 12:21 PM  Result Value Ref Range   Glucose, UA NEGATIVE NEGATIVE mg/dL   Bilirubin Urine NEGATIVE NEGATIVE   Ketones, ur NEGATIVE NEGATIVE mg/dL   Specific Gravity, Urine 1.020 1.005 - 1.030   Hgb urine dipstick MODERATE (A) NEGATIVE   pH 6.5 5.0 - 8.0   Protein, ur NEGATIVE NEGATIVE mg/dL   Urobilinogen, UA 0.2 0.0 - 1.0 mg/dL   Nitrite NEGATIVE NEGATIVE   Leukocytes, UA NEGATIVE NEGATIVE    Comment: Biochemical Testing Only. Please order routine urinalysis from main lab if confirmatory testing is needed.  Pregnancy, urine POC     Status: None   Collection Time: 03/15/18 12:26 PM  Result Value Ref Range   Preg Test, Ur NEGATIVE NEGATIVE    Comment:        THE SENSITIVITY OF THIS METHODOLOGY IS >24 mIU/mL  No results found.  ASSESSMENT AND PLAN:   Vaginal bleeding - Most likely onset of menses.  Good exam.  Normal vitals.  Discharge Instructions   None        The patient is advised to call or return to clinic if she does not see an improvement in symptoms, or to seek the care of the closest emergency department if she worsens with the above plan.   Deliah Boston, MHS, PA-C 03/15/2018 12:42 PM   Ofilia Neas, PA-C 03/15/18 1242

## 2018-03-15 NOTE — ED Triage Notes (Signed)
Pt presents to [facility] for possible pregnancy and would like a pregnancy test

## 2018-03-19 ENCOUNTER — Encounter (HOSPITAL_COMMUNITY): Payer: Self-pay | Admitting: Emergency Medicine

## 2018-03-19 ENCOUNTER — Emergency Department (HOSPITAL_COMMUNITY)
Admission: EM | Admit: 2018-03-19 | Discharge: 2018-03-20 | Disposition: A | Discharge status: Home / Self Care | Payer: Medicaid Other | Attending: Emergency Medicine | Admitting: Emergency Medicine

## 2018-03-19 ENCOUNTER — Other Ambulatory Visit: Payer: Self-pay

## 2018-03-19 DIAGNOSIS — G4700 Insomnia, unspecified: Secondary | ICD-10-CM | POA: Diagnosis not present

## 2018-03-19 DIAGNOSIS — R1033 Periumbilical pain: Secondary | ICD-10-CM | POA: Insufficient documentation

## 2018-03-19 DIAGNOSIS — Z91010 Allergy to peanuts: Secondary | ICD-10-CM | POA: Insufficient documentation

## 2018-03-19 DIAGNOSIS — Z79899 Other long term (current) drug therapy: Secondary | ICD-10-CM | POA: Diagnosis not present

## 2018-03-19 LAB — URINALYSIS, ROUTINE W REFLEX MICROSCOPIC
BILIRUBIN URINE: NEGATIVE
Glucose, UA: NEGATIVE mg/dL
Ketones, ur: NEGATIVE mg/dL
NITRITE: NEGATIVE
Protein, ur: 100 mg/dL — AB
Specific Gravity, Urine: 1.009 (ref 1.005–1.030)
pH: 6 (ref 5.0–8.0)

## 2018-03-19 LAB — CBC
HEMATOCRIT: 42.8 % (ref 36.0–46.0)
Hemoglobin: 12.9 g/dL (ref 12.0–15.0)
MCH: 25.6 pg — ABNORMAL LOW (ref 26.0–34.0)
MCHC: 30.1 g/dL (ref 30.0–36.0)
MCV: 84.9 fL (ref 80.0–100.0)
NRBC: 0 % (ref 0.0–0.2)
PLATELETS: 321 10*3/uL (ref 150–400)
RBC: 5.04 MIL/uL (ref 3.87–5.11)
RDW: 12.3 % (ref 11.5–15.5)
WBC: 7.3 10*3/uL (ref 4.0–10.5)

## 2018-03-19 LAB — COMPREHENSIVE METABOLIC PANEL
ALT: 20 U/L (ref 0–44)
AST: 23 U/L (ref 15–41)
Albumin: 4.4 g/dL (ref 3.5–5.0)
Alkaline Phosphatase: 69 U/L (ref 38–126)
Anion gap: 12 (ref 5–15)
BUN: 5 mg/dL — ABNORMAL LOW (ref 6–20)
CHLORIDE: 102 mmol/L (ref 98–111)
CO2: 23 mmol/L (ref 22–32)
CREATININE: 0.82 mg/dL (ref 0.44–1.00)
Calcium: 9.9 mg/dL (ref 8.9–10.3)
Glucose, Bld: 99 mg/dL (ref 70–99)
Potassium: 4 mmol/L (ref 3.5–5.1)
Sodium: 137 mmol/L (ref 135–145)
TOTAL PROTEIN: 8.4 g/dL — AB (ref 6.5–8.1)
Total Bilirubin: 0.6 mg/dL (ref 0.3–1.2)

## 2018-03-19 LAB — LIPASE, BLOOD: LIPASE: 29 U/L (ref 11–51)

## 2018-03-19 LAB — I-STAT BETA HCG BLOOD, ED (MC, WL, AP ONLY)

## 2018-03-19 NOTE — ED Triage Notes (Signed)
C/o generalized abd pain x 5 days and difficulty sleeping.  Reports initially had vomiting and diarrhea that has resolved.  LMP started 4 days ago.

## 2018-03-20 MED ORDER — DIPHENHYDRAMINE HCL 25 MG PO TABS: 50.0000 mg | ORAL_TABLET | Freq: Every day | ORAL | 0 refills | Status: DC

## 2018-03-20 MED ORDER — DICYCLOMINE HCL 20 MG PO TABS: 20.0000 mg | ORAL_TABLET | Freq: Two times a day (BID) | ORAL | 0 refills | Status: DC

## 2018-03-20 NOTE — ED Notes (Signed)
Pt stable and ambulatory for discharge, states understanding follow up.  

## 2018-03-20 NOTE — Discharge Instructions (Addendum)
Use medications as prescribed. Return to the emergency department if you have high fever, severe pain, new symptoms of concern. Otherwise, follow up with a primary care provider for further outpatient evaluation if symptoms do not improve.

## 2018-03-20 NOTE — ED Provider Notes (Signed)
[facility] EMERGENCY DEPARTMENT Provider Note   CSN: [id number] Arrival date & time: 03/19/18  2236     History   Chief Complaint Chief Complaint  Patient presents with  . Abdominal Pain  . Insomnia    HPI Barbara George is a 22 y.o. female.  The patient presents with complaint of abdominal pain for the past 5 days, located in the periumbilical area and is intermittent and non-radiating. She denies nausea but reports she vomiting once earlier today. No fever, diarrhea, vaginal symptoms, dysuria, chest pain or cough. No modifying factors. She has not taken anything for symptoms. She also complains of insomnia, being unable to sleep. She does not identify whether this means she has trouble falling asleep or staying asleep. Friend/family member in the room with her who can offer little information but states she could not sleep last night. Other than this, duration of symptoms is difficult to determine. Patient was offered an interpreter but declines stating she can understand everything without need for interpreter. She is a difficult historian because she does not answer questions fully, using yes or no or "I don't remember" as her most used answers. She does not offer details. She is treated for Bipolar disorder and reports she saw her therapist today. She denies hallucinations. No regular or increased caffeine use. No new medications that might cause insomnia.  The history is provided by the patient. No language interpreter was used.  Abdominal Pain   Associated symptoms include vomiting. Pertinent negatives include fever, diarrhea, nausea, constipation, dysuria and myalgias.  Insomnia  Associated symptoms include abdominal pain.    Past Medical History:  Diagnosis Date  . Bipolar 1 disorder (HCC)   . Depression     Patient Active Problem List   Diagnosis Date Noted  . Bipolar I disorder, current or most recent episode manic, with psychotic features  (HCC) 08/08/2016    History reviewed. No pertinent surgical history.   OB History   None      Home Medications    Prior to Admission medications   Medication Sig Start Date End Date Taking? Authorizing Provider  acetaminophen (TYLENOL) 500 MG tablet Take 1 tablet (500 mg total) by mouth every 6 (six) hours as needed. 12/15/17   Georgetta Haber, NP  ARIPiprazole ER (ABILIFY MAINTENA) 400 MG SRER injection Inject into the muscle. 10/07/16   [provider]  ARIPiprazole ER 400 MG SRER Inject 400 mg into the muscle every 28 (twenty-eight) days. 09/03/16   Pucilowska, Braulio Conte B, MD  FOLIC ACID PO Take by mouth.    [provider]  ondansetron (ZOFRAN) 4 MG tablet Take 1 tablet (4 mg total) by mouth every 8 (eight) hours as needed for nausea or vomiting. 12/15/17   Georgetta Haber, NP  simethicone (MYLICON) 80 MG chewable tablet Chew 1 tablet (80 mg total) by mouth every 6 (six) hours as needed (bloating). 12/15/17   Georgetta Haber, NP    Family History Family History  Problem Relation Age of Onset  . Hypertension Father     Social History Social History   Tobacco Use  . Smoking status: Never Smoker  . Smokeless tobacco: Never Used  Substance Use Topics  . Alcohol use: No    Alcohol/week: 0.0 standard drinks  . Drug use: No     Allergies   Peanut-containing drug products and Pork-derived products   Review of Systems Review of Systems  Constitutional: Negative for chills and fever.  HENT:  Negative.   Respiratory: Negative.   Cardiovascular: Negative.   Gastrointestinal: Positive for abdominal pain and vomiting. Negative for constipation, diarrhea and nausea.  Genitourinary: Negative.  Negative for dysuria and vaginal discharge.  Musculoskeletal: Negative.  Negative for myalgias.  Skin: Negative.   Neurological: Negative.   Psychiatric/Behavioral: Positive for sleep disturbance. Negative for hallucinations. The patient has insomnia.      Physical  Exam Updated Vital Signs BP (!) 137/100   Pulse 83   Temp 98.6 F (37 C) (Oral)   Resp 16   LMP 03/15/2018   SpO2 98%   Physical Exam  Constitutional: She appears well-developed and well-nourished.  HENT:  Head: Normocephalic.  Neck: Normal range of motion. Neck supple.  Cardiovascular: Normal rate and regular rhythm.  Pulmonary/Chest: Effort normal and breath sounds normal.  Abdominal: Soft. Bowel sounds are normal. She exhibits no distension. There is no tenderness. There is no rebound and no guarding.  Musculoskeletal: Normal range of motion.  Neurological: She is alert. No cranial nerve deficit.  Skin: Skin is warm and dry. No rash noted.  Psychiatric: Her affect is blunt. Her speech is delayed. She is not actively hallucinating. She is noncommunicative.     ED Treatments / Results  Labs (all labs ordered are listed, but only abnormal results are displayed) Labs Reviewed  COMPREHENSIVE METABOLIC PANEL - Abnormal; Notable for the following components:      Result Value   BUN 5 (*)    Total Protein 8.4 (*)    All other components within normal limits  CBC - Abnormal; Notable for the following components:   MCH 25.6 (*)    All other components within normal limits  URINALYSIS, ROUTINE W REFLEX MICROSCOPIC - Abnormal; Notable for the following components:   Color, Urine RED (*)    APPearance CLOUDY (*)    Hgb urine dipstick LARGE (*)    Protein, ur 100 (*)    Leukocytes, UA TRACE (*)    Bacteria, UA MANY (*)    All other components within normal limits  LIPASE, BLOOD  I-STAT BETA HCG BLOOD, ED ([facility], [facility], [facility] ONLY)    EKG None  Radiology No results found.  Procedures Procedures (including critical care time)  Medications Ordered in ED Medications - No data to display   Initial Impression / Assessment and Plan / ED Course  I have reviewed the triage vital signs and the nursing notes.  Pertinent labs & imaging results that were available during my care of  the patient were reviewed by me and considered in my medical decision making (see chart for details).     The patient is here with abdominal pain x 5 days, coming and going, without alleviating or aggravating factors. No fever, urinary symptoms. She also c/o insomnia for, the best I can tell, one night.   Labs are reassuring. VSS, no fever. She has a benign abdominal exam without distention or tenderness. BS are normoactive. She is not pregnant.   She has a flat affect and does not offer detailed information regarding her visit to the ED. No acute process identified and she is felt safe to discharge home. Will provide Bentyl to see if this helps her symptoms of intermittent abdominal pain, and recommend Benadryl qHS to see if this helps her sleeping. Will provide resources for primary care providers to see if symptoms continue.  Final Clinical Impressions(s) / ED Diagnoses   Final diagnoses:  None   1. Abdominal pain 2. Insomnia  ED  Discharge Orders    None       Elpidio Anis, PA-C 03/20/18 0017    Dione Booze, MD 03/20/18 [phone]

## 2018-03-22 ENCOUNTER — Encounter (HOSPITAL_COMMUNITY): Payer: Self-pay | Admitting: Emergency Medicine

## 2018-03-22 ENCOUNTER — Emergency Department (HOSPITAL_COMMUNITY)
Admission: EM | Admit: 2018-03-22 | Discharge: 2018-03-22 | Disposition: A | Discharge status: Home / Self Care | Payer: Medicaid Other | Attending: Emergency Medicine | Admitting: Emergency Medicine

## 2018-03-22 DIAGNOSIS — Z79899 Other long term (current) drug therapy: Secondary | ICD-10-CM | POA: Insufficient documentation

## 2018-03-22 DIAGNOSIS — G4700 Insomnia, unspecified: Secondary | ICD-10-CM

## 2018-03-22 DIAGNOSIS — Z91010 Allergy to peanuts: Secondary | ICD-10-CM | POA: Diagnosis not present

## 2018-03-22 HISTORY — DX: Schizophrenia, unspecified: F20.9

## 2018-03-22 LAB — COMPREHENSIVE METABOLIC PANEL
ALT: 20 U/L (ref 0–44)
ANION GAP: 8 (ref 5–15)
AST: 24 U/L (ref 15–41)
Albumin: 4.1 g/dL (ref 3.5–5.0)
Alkaline Phosphatase: 63 U/L (ref 38–126)
BUN: 8 mg/dL (ref 6–20)
CHLORIDE: 106 mmol/L (ref 98–111)
CO2: 23 mmol/L (ref 22–32)
Calcium: 9.5 mg/dL (ref 8.9–10.3)
Creatinine, Ser: 0.9 mg/dL (ref 0.44–1.00)
Glucose, Bld: 137 mg/dL — ABNORMAL HIGH (ref 70–99)
Potassium: 4.1 mmol/L (ref 3.5–5.1)
SODIUM: 137 mmol/L (ref 135–145)
Total Bilirubin: 0.3 mg/dL (ref 0.3–1.2)
Total Protein: 7.3 g/dL (ref 6.5–8.1)

## 2018-03-22 LAB — CBC
HCT: 38.4 % (ref 36.0–46.0)
Hemoglobin: 11.9 g/dL — ABNORMAL LOW (ref 12.0–15.0)
MCH: 26.4 pg (ref 26.0–34.0)
MCHC: 31 g/dL (ref 30.0–36.0)
MCV: 85.3 fL (ref 80.0–100.0)
NRBC: 0 % (ref 0.0–0.2)
Platelets: 259 10*3/uL (ref 150–400)
RBC: 4.5 MIL/uL (ref 3.87–5.11)
RDW: 12.3 % (ref 11.5–15.5)
WBC: 6.7 10*3/uL (ref 4.0–10.5)

## 2018-03-22 LAB — ACETAMINOPHEN LEVEL

## 2018-03-22 LAB — SALICYLATE LEVEL

## 2018-03-22 LAB — ETHANOL

## 2018-03-22 MED ORDER — HYDROXYZINE HCL 25 MG PO TABS
50.0000 mg | ORAL_TABLET | Freq: Once | ORAL | Status: AC
  Administered 2018-03-22: 50 mg via ORAL
  Filled 2018-03-22: qty 2

## 2018-03-22 NOTE — ED Triage Notes (Signed)
Pt presents to ED for assessment of insomnia x 1 week.  Patient's husband and friend at bedside.  Used Ecologist.  Patient not answering any of my questions.  All she states is no pain.  Tachycardic in triage.  Patient's husband comes with a fruit from [place] in his bag and a solution in another bag, and states he is concerned his wife has been drinking it.  States the fruit has much more caffeine than coffee, and it may be what is keeping her awake.

## 2018-03-22 NOTE — ED Provider Notes (Signed)
[facility] EMERGENCY DEPARTMENT Provider Note   CSN: [id number] Arrival date & time: 03/22/18  1605     History   Chief Complaint Chief Complaint  Patient presents with  . Psychiatric Evaluation    HPI Arria Midou Hamadou is a 22 y.o. female.  Patient is a 22 year old female with a history of bipolar disease, depression and schizophrenia who is presenting today with her family members because she has been unable to sleep for multiple days.  Patient was seen approximately 3 days ago for similar symptoms but at that time she was also having abdominal pain.  Patient denies any pain, nausea or vomiting.  She states she is currently on her menses.  Family states that she has had mental problems for years and she is currently getting a shot in taking oral medications as prescribed by behavioral health.  They deny her having any hallucinations, suicidal or homicidal thoughts or agitation.  They are here strictly because she cannot sleep and because she is not sleeping at night her husband is also not sleeping at night.  They did discover today that she has been eating and African fruit that they state usually people eat because it has significantly more caffeine than coffee and it can keep you awake.  She has been eating the fruit and drinking a juice that it is made from.  They are thinking this may be why she is not sleeping.  Patient does admit to eating the fruit and drinking the juice but does not know why.  The history is provided by the patient, the spouse and a friend.    Past Medical History:  Diagnosis Date  . Bipolar 1 disorder (HCC)   . Depression   . Schizophrenia [facility])     Patient Active Problem List   Diagnosis Date Noted  . Bipolar I disorder, current or most recent episode manic, with psychotic features (HCC) 08/08/2016    History reviewed. No pertinent surgical history.   OB History   None      Home Medications    Prior to Admission  medications   Medication Sig Start Date End Date Taking? Authorizing Provider  acetaminophen (TYLENOL) 500 MG tablet Take 1 tablet (500 mg total) by mouth every 6 (six) hours as needed. 12/15/17   Georgetta Haber, NP  ARIPiprazole ER (ABILIFY MAINTENA) 400 MG SRER injection Inject into the muscle. 10/07/16   [provider]  ARIPiprazole ER 400 MG SRER Inject 400 mg into the muscle every 28 (twenty-eight) days. 09/03/16   Pucilowska, Jolanta B, MD  dicyclomine (BENTYL) 20 MG tablet Take 1 tablet (20 mg total) by mouth 2 (two) times daily. 03/20/18   Elpidio Anis, PA-C  diphenhydrAMINE (BENADRYL) 25 MG tablet Take 2 tablets (50 mg total) by mouth at bedtime. 03/20/18   Elpidio Anis, PA-C  FOLIC ACID PO Take by mouth.    [provider]  ondansetron (ZOFRAN) 4 MG tablet Take 1 tablet (4 mg total) by mouth every 8 (eight) hours as needed for nausea or vomiting. 12/15/17   Georgetta Haber, NP  simethicone (MYLICON) 80 MG chewable tablet Chew 1 tablet (80 mg total) by mouth every 6 (six) hours as needed (bloating). 12/15/17   Georgetta Haber, NP    Family History Family History  Problem Relation Age of Onset  . Hypertension Father     Social History Social History   Tobacco Use  . Smoking status: Never Smoker  . Smokeless tobacco: Never  Used  Substance Use Topics  . Alcohol use: No    Alcohol/week: 0.0 standard drinks  . Drug use: No     Allergies   Peanut-containing drug products and Pork-derived products   Review of Systems Review of Systems  All other systems reviewed and are negative.    Physical Exam Updated Vital Signs BP 139/82 (BP Location: Right Arm)   Pulse (!) 119   Temp 98.8 F (37.1 C) (Oral)   Resp 18   LMP 03/15/2018   SpO2 99%   Physical Exam  Constitutional: She is oriented to person, place, and time. She appears well-developed and well-nourished. No distress.  HENT:  Head: Normocephalic and atraumatic.  Mouth/Throat: Oropharynx is  clear and moist.  Eyes: Pupils are equal, round, and reactive to light. Conjunctivae and EOM are normal.  Neck: Normal range of motion. Neck supple.  Cardiovascular: Regular rhythm and intact distal pulses. Tachycardia present.  No murmur heard. Pulmonary/Chest: Effort normal and breath sounds normal. No respiratory distress. She has no wheezes. She has no rales.  Abdominal: Soft. She exhibits no distension. There is no tenderness. There is no rebound and no guarding.  Musculoskeletal: Normal range of motion. She exhibits no edema or tenderness.  Neurological: She is alert and oriented to person, place, and time.  Skin: Skin is warm and dry. No rash noted. No erythema.  Psychiatric: She is hyperactive. She is not actively hallucinating. She expresses no homicidal and no suicidal ideation.  Intermittently smiling and giggling which is somewhat inappropriate.  She will intermittently answer questions but then at times just looks to the ceiling.  She answers questions with very short phrases  Nursing note and vitals reviewed.    ED Treatments / Results  Labs (all labs ordered are listed, but only abnormal results are displayed) Labs Reviewed  COMPREHENSIVE METABOLIC PANEL - Abnormal; Notable for the following components:      Result Value   Glucose, Bld 137 (*)    All other components within normal limits  ACETAMINOPHEN LEVEL - Abnormal; Notable for the following components:   Acetaminophen (Tylenol), Serum <10 (*)    All other components within normal limits  CBC - Abnormal; Notable for the following components:   Hemoglobin 11.9 (*)    All other components within normal limits  ETHANOL  SALICYLATE LEVEL  RAPID URINE DRUG SCREEN, HOSP PERFORMED  I-STAT BETA HCG BLOOD, ED ([facility], [facility], [facility] ONLY)    EKG None  Radiology No results found.  Procedures Procedures (including critical care time)  Medications Ordered in ED Medications - No data to display   Initial Impression /  Assessment and Plan / ED Course  I have reviewed the triage vital signs and the nursing notes.  Pertinent labs & imaging results that were available during my care of the patient were reviewed by me and considered in my medical decision making (see chart for details).    Patient is a 22 year old female with a history of mental illness here today for insomnia for the last several days.  This is also in the setting of her eating a fruit that has significant amounts of caffeine and drinking a juice that similar.  Family members are not worried about her mental health.  They state its been about the same she is taking her medications and they do not believe that she is a threat to herself or anyone else.  Patient is appropriate on exam except for that she is intermittently giggling and smiling but  she is not aggressive she is cooperative and has an otherwise normal exam.  Vital signs are now normal despite her tachycardia when she was in triage.  Patient's urine pregnancy test was -3 days ago and she is currently on her menses.  Labs here without acute findings.  Discussed with family members that there is nothing I can do to make the effects of the fruit go away any faster but they should avoid her having it anymore.  Also offered her a dose of hydroxyzine for tonight to see if that might help her sleep.  She is already taking Risperdal and Cogentin.   Final Clinical Impressions(s) / ED Diagnoses   Final diagnoses:  Insomnia, unspecified type    ED Discharge Orders    None       Gwyneth Sprout, MD 03/22/18 2037

## 2018-10-23 ENCOUNTER — Observation Stay (HOSPITAL_COMMUNITY)
Admission: EM | Admit: 2018-10-23 | Discharge: 2018-10-24 | Disposition: A | Discharge status: Other Acute Inpatient Hospital | Payer: Medicaid Other | Attending: Psychiatry | Admitting: Psychiatry

## 2018-10-23 DIAGNOSIS — Z79899 Other long term (current) drug therapy: Secondary | ICD-10-CM | POA: Diagnosis not present

## 2018-10-23 DIAGNOSIS — F250 Schizoaffective disorder, bipolar type: Principal | ICD-10-CM | POA: Diagnosis present

## 2018-10-23 DIAGNOSIS — F419 Anxiety disorder, unspecified: Secondary | ICD-10-CM | POA: Diagnosis not present

## 2018-10-23 DIAGNOSIS — F600 Paranoid personality disorder: Secondary | ICD-10-CM | POA: Diagnosis not present

## 2018-10-23 DIAGNOSIS — F22 Delusional disorders: Secondary | ICD-10-CM | POA: Diagnosis not present

## 2018-10-23 DIAGNOSIS — R45851 Suicidal ideations: Secondary | ICD-10-CM | POA: Insufficient documentation

## 2018-10-23 DIAGNOSIS — Z8249 Family history of ischemic heart disease and other diseases of the circulatory system: Secondary | ICD-10-CM | POA: Insufficient documentation

## 2018-10-23 LAB — COMPREHENSIVE METABOLIC PANEL
ALT: 15 U/L (ref 0–44)
AST: 18 U/L (ref 15–41)
Albumin: 4.2 g/dL (ref 3.5–5.0)
Alkaline Phosphatase: 70 U/L (ref 38–126)
Anion gap: 9 (ref 5–15)
BUN: 8 mg/dL (ref 6–20)
CO2: 25 mmol/L (ref 22–32)
Calcium: 9.3 mg/dL (ref 8.9–10.3)
Chloride: 106 mmol/L (ref 98–111)
Creatinine, Ser: 0.72 mg/dL (ref 0.44–1.00)
GFR calc Af Amer: >60 mL/min (ref >60)
GFR calc non Af Amer: >60 mL/min (ref >60)
Glucose, Bld: 111 mg/dL — ABNORMAL HIGH (ref 70–99)
Potassium: 3.6 mmol/L (ref 3.5–5.1)
Sodium: 140 mmol/L (ref 135–145)
Total Bilirubin: 0.1 mg/dL — ABNORMAL LOW (ref 0.3–1.2)
Total Protein: 7.7 g/dL (ref 6.5–8.1)

## 2018-10-23 LAB — CBC
HCT: 40.5 % (ref 36.0–46.0)
Hemoglobin: 12.1 g/dL (ref 12.0–15.0)
MCH: 26.3 pg (ref 26.0–34.0)
MCHC: 29.9 g/dL — ABNORMAL LOW (ref 30.0–36.0)
MCV: 88 fL (ref 80.0–100.0)
Platelets: 269 10*3/uL (ref 150–400)
RBC: 4.6 MIL/uL (ref 3.87–5.11)
RDW: 12.6 % (ref 11.5–15.5)
WBC: 6.5 10*3/uL (ref 4.0–10.5)
nRBC: 0 % (ref 0.0–0.2)

## 2018-10-23 LAB — LIPID PANEL
Cholesterol: 176 mg/dL (ref 0–200)
HDL: 71 mg/dL (ref >40)
LDL Cholesterol: 95 mg/dL (ref 0–99)
Total CHOL/HDL Ratio: 2.5 RATIO
Triglycerides: 50 mg/dL (ref <150)
VLDL: 10 mg/dL (ref 0–40)

## 2018-10-23 LAB — TSH: TSH: 0.278 u[IU]/mL — ABNORMAL LOW (ref 0.350–4.500)

## 2018-10-23 LAB — HEMOGLOBIN A1C
Hgb A1c MFr Bld: 5.5 % (ref 4.8–5.6)
Mean Plasma Glucose: 111.15 mg/dL

## 2018-10-23 MED ORDER — ACETAMINOPHEN 325 MG PO TABS
650.0000 mg | ORAL_TABLET | Freq: Four times a day (QID) | ORAL | Status: DC | PRN
  Filled 2018-10-23: qty 2

## 2018-10-23 MED ORDER — TRAZODONE HCL 50 MG PO TABS
50.0000 mg | ORAL_TABLET | Freq: Every evening | ORAL | Status: DC | PRN
  Administered 2018-10-24: 50 mg via ORAL
  Filled 2018-10-23: qty 1

## 2018-10-23 MED ORDER — HYDROXYZINE HCL 25 MG PO TABS
25.0000 mg | ORAL_TABLET | Freq: Three times a day (TID) | ORAL | Status: DC | PRN
  Filled 2018-10-23: qty 1

## 2018-10-23 MED ORDER — OLANZAPINE 5 MG PO TABS
5.0000 mg | ORAL_TABLET | Freq: Once | ORAL | Status: AC
  Administered 2018-10-23: 5 mg via ORAL
  Filled 2018-10-23: qty 1

## 2018-10-23 MED ORDER — OLANZAPINE 5 MG PO TABS
5.0000 mg | ORAL_TABLET | Freq: Every day | ORAL | Status: DC
  Administered 2018-10-24: 5 mg via ORAL
  Filled 2018-10-23 (×2): qty 1

## 2018-10-23 MED ORDER — ALUM & MAG HYDROXIDE-SIMETH 200-200-20 MG/5ML PO SUSP
30.0000 mL | ORAL | Status: DC | PRN
  Filled 2018-10-23: qty 30

## 2018-10-23 MED ORDER — MAGNESIUM HYDROXIDE 400 MG/5ML PO SUSP
30.0000 mL | Freq: Every day | ORAL | Status: DC | PRN
  Filled 2018-10-23: qty 30

## 2018-10-23 NOTE — Progress Notes (Signed)
Pt meets inpatient criteria per Dorise Hiss, NP. Referral information has been sent to the following hospitals for review:  [place], [facility], [place], [facility], [place], [facility], [place], [place], [facility]  Disposition will continue to assist with inpatient placement needs.   Wells Guiles, LCSW, LCAS Disposition CSW [facility]/TTS [phone]

## 2018-10-23 NOTE — H&P (Signed)
BH Observation Unit Provider Admission PAA/H&P  Patient Identification: Barbara George MRN:  [id number] Date of Evaluation:  10/23/2018 Chief Complaint:  pending Principal Diagnosis: <principal problem not specified> Diagnosis:  Active Problems:   Schizoaffective disorder, bipolar type (HCC)  History of Present Illness: Barbara George is an 23 y.o. female.Pt presented to [facility] with [facility], under IVC. Pt is disorganized and delusional. Pt has thoughts of suicide but stated she can not tell me her plan. Per IVC paperwork, Pt is worried she will be killed by voodoo and her plan is to take too many pills. Pt is hyper religous and is seen praying in the assessment room. Of note her IVC paperwork stated she is unsure when her last menstrual cycle was and she is trying to conceive. Labs have been ordered for evening draw.  Pt's last inpatient hospitalization was in April 2018 at [place]. Per record review she was on Abilify PO and Abilify Maintena IM. Pt will be started on Zyprexa 5 mg at bedtime for mood stabilization. She will also receive Zyprexa 5 mg now as a one time dose now, for a more immediate onset, due to her psychosis. Pt appears anxious and paranoid. Pt is recommended for an inpatient admission for stabilization. Will admit to OBS unit until a bed is available.   Associated Signs/Symptoms: Depression Symptoms:  difficulty concentrating, recurrent thoughts of death, suicidal thoughts with specific plan, anxiety, (Hypo) Manic Symptoms:  Delusions, Distractibility, Hallucinations, Irritable Mood, Anxiety Symptoms:  Excessive Worry, Obsessive Compulsive Symptoms:   Handwashing,, Psychotic Symptoms:  Hallucinations: Auditory Ideas of Reference, Paranoia, thinks she has voodoo on her PTSD Symptoms: Negative Total Time spent with patient: 30 minutes  Past Psychiatric History: Schizoaffective Disorder, Bipolar Type  Is the patient at risk to self? Yes.    Has the patient  been a risk to self in the past 6 months? Yes.    Has the patient been a risk to self within the distant past? Yes.    Is the patient a risk to others? No.  Has the patient been a risk to others in the past 6 months? No.  Has the patient been a risk to others within the distant past? No.   Prior Inpatient Therapy: Prior Inpatient Therapy: No Prior Outpatient Therapy: Prior Outpatient Therapy: Yes Prior Therapy Dates: ongoing Prior Therapy Facilty/Provider(s): [facility] Reason for Treatment: MH Does patient have an ACCT team?: No Does patient have Intensive In-House Services?  : No Does patient have [facility] services? : No Does patient have P4CC services?: No  Alcohol Screening:   Substance Abuse History in the last 12 months:  No. Consequences of Substance Abuse: Negative Previous Psychotropic Medications: Yes  Psychological Evaluations: No  Past Medical History:  Past Medical History:  Diagnosis Date  . Bipolar 1 disorder (HCC)   . Depression   . Schizophrenia (HCC)    No past surgical history on file. Family History:  Family History  Problem Relation Age of Onset  . Hypertension Father    Family Psychiatric History: Pt unable to provide this information due to psychosis Tobacco Screening:   Social History:  Social History   Substance and Sexual Activity  Alcohol Use No  . Alcohol/week: 0.0 standard drinks     Social History   Substance and Sexual Activity  Drug Use No    Additional Social History: Marital status: Married(Pt reports being married)    Pain Medications: See MARs Prescriptions: See MARs Over the Counter: See MARs History of alcohol /  drug use?: No history of alcohol / drug abuse    Allergies:   Allergies  Allergen Reactions  . Peanut-Containing Drug Products Nausea And Vomiting  . Pork-Derived Products Other (See Comments) and Rash    Religious purposes Religious purposes   Lab Results: No results found for this or any previous visit (from the  past 48 hour(s)).  Blood Alcohol level:  Lab Results  Component Value Date   ETH <10 03/22/2018   ETH <5 09/07/2016    Metabolic Disorder Labs:  Lab Results  Component Value Date   HGBA1C 5.3 08/09/2016   MPG 105 08/09/2016   No results found for: PROLACTIN Lab Results  Component Value Date   CHOL 150 08/09/2016   TRIG 51 08/09/2016   HDL 69 08/09/2016   CHOLHDL 2.2 08/09/2016   VLDL 10 08/09/2016   LDLCALC 71 08/09/2016    Current Medications: Current Outpatient Medications  Medication Sig Dispense Refill  . acetaminophen (TYLENOL) 500 MG tablet Take 1 tablet (500 mg total) by mouth every 6 (six) hours as needed. 30 tablet 0  . ARIPiprazole ER (ABILIFY MAINTENA) 400 MG SRER injection Inject into the muscle.    . ARIPiprazole ER 400 MG SRER Inject 400 mg into the muscle every 28 (twenty-eight) days. 1 each 0  . dicyclomine (BENTYL) 20 MG tablet Take 1 tablet (20 mg total) by mouth 2 (two) times daily. 20 tablet 0  . diphenhydrAMINE (BENADRYL) 25 MG tablet Take 2 tablets (50 mg total) by mouth at bedtime. 20 tablet 0  . FOLIC ACID PO Take by mouth.    . ondansetron (ZOFRAN) 4 MG tablet Take 1 tablet (4 mg total) by mouth every 8 (eight) hours as needed for nausea or vomiting. 10 tablet 0  . simethicone (MYLICON) 80 MG chewable tablet Chew 1 tablet (80 mg total) by mouth every 6 (six) hours as needed (bloating). 30 tablet 0   Current Facility-Administered Medications  Medication Dose Route Frequency Provider Last Rate Last Dose  . acetaminophen (TYLENOL) tablet 650 mg  650 mg Oral Q6H PRN Laveda AbbeParks, Jovonni Borquez Britton, NP      . alum & mag hydroxide-simeth (MAALOX/MYLANTA) 200-200-20 MG/5ML suspension 30 mL  30 mL Oral Q4H PRN Laveda AbbeParks, Xochitl Egle Britton, NP      . hydrOXYzine (ATARAX/VISTARIL) tablet 25 mg  25 mg Oral TID PRN Laveda AbbeParks, Tashayla Therien Britton, NP      . magnesium hydroxide (MILK OF MAGNESIA) suspension 30 mL  30 mL Oral Daily PRN Laveda AbbeParks, Francena Zender Britton, NP      . OLANZapine [facility](ZYPREXA)  tablet 5 mg  5 mg Oral QHS Laveda AbbeParks, Sheronda Parran Britton, NP      . OLANZapine [facility](ZYPREXA) tablet 5 mg  5 mg Oral Once Laveda AbbeParks, Thao Bauza Britton, NP      . traZODone (DESYREL) tablet 50 mg  50 mg Oral QHS PRN Laveda AbbeParks, Ameila Weldon Britton, NP       PTA Medications: (Not in a hospital admission)   Total Time spent with patient: 30 minutes  Psychiatric Specialty Exam: Physical Exam  Constitutional: She appears well-developed and well-nourished.  HENT:  Head: Normocephalic.  Respiratory: Effort normal.  Musculoskeletal: Normal range of motion.  Neurological: She is alert.  Psychiatric: Her mood appears anxious. She is actively hallucinating. Thought content is paranoid and delusional.    Review of Systems  Psychiatric/Behavioral: Positive for hallucinations and suicidal ideas. The patient is nervous/anxious.   All other systems reviewed and are negative.   There were no vitals taken for this  visit.There is no height or weight on file to calculate BMI.  General Appearance: Casual  Eye Contact:  Good  Speech:  Clear and Coherent and Pressured  Volume:  Decreased  Mood:  Anxious  Affect:  Congruent and Constricted  Thought Process:  Disorganized and Descriptions of Associations: Loose  Orientation:  Full (Time, Place, and Person)  Thought Content:  Illogical, Delusions and Ideas of Reference:   Paranoia  Suicidal Thoughts:  Yes.  with intent/plan  Homicidal Thoughts:  No  Memory:  Immediate;   Fair Recent;   Fair Remote;   Fair  Judgement:  Impaired  Insight:  Shallow  Psychomotor Activity:  Normal  Concentration: Concentration: Fair and Attention Span: Fair  Recall:  [employer] of Knowledge:Fair  Language: Good  Akathisia:  Negative  Handed:  Right  AIMS (if indicated):     Assets:  Architect Housing  Sleep:       Musculoskeletal: Strength & Muscle Tone: within normal limits Gait & Station: normal Patient leans: N/A  Treatment Plan  Summary: Daily contact with patient to assess and evaluate symptoms and progress in treatment, Medication management and Plan Admit to [facility] OBS unit until an inpatient psychiatric bed is available  Observation Level/Precautions:  15 minute checks Laboratory:  CBC Chemistry Profile HbAIC UDS UA TSH, Lipid profile, Urine pregnancy Psychotherapy:  TBD Medications:   Start Zyprexa 5 mg QHS for mood control Give Zyprexa 5 mg one time dose now for psychosis Discharge Concerns:  Medication adherence and Safety Estimated LOS:24-48 hours    Laveda Abbe, NP 5/21/20202:02 PM

## 2018-10-23 NOTE — BH Assessment (Signed)
Assessment Note  Barbara George is a 23 y.o. female brought to [facility] by [employer] after being IVC'd by [facility] for further evaluation.    According to the IVC paperwork the pt endorse suicide ideation with thoughts of overdosing on pills and makes statement like "take care of yourself, I'm going forever". She disorganized and not sleeping."    Pt admits to having suicidal thoughts. Pt states "yes, I want to kill myself but I don't want to go to hell."  Pt begin to speak about her religion and states "only Allah is perfect.  I am only happy at a 9 not a 10.  I am also sad."  Pt denies HI/SA/A/V-hallucinations.  Pt resides with her husband and family.  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  Pt receives outpatient MH services at [facility] in [facility].   Patient was wearing casual clothes without a bra and appeared inappropriately groomed.  Pt was alert throughout the assessment.  Patient made fair eye contact and had normal psychomotor activity.  Patient spoke in a normal voice with pressured speech.  Pt expressed feeling happy and sad.  Pt's affect appeared euphoric and incongruent with stated mood. Pt's thought process was tangential.  Pt presented with partial insight and judgement.  Pt did not appear to be responding to internal stimuli.  Pt was not able to reliably contract for safety.  Disposition: [facility] discussed case with BH Provider, Elta Guadeloupe, NP who recommends inpatient treatment.  TTS will look for inpatient placement.   Diagnosis: F25.0 Schizoaffective Disorder Bipolar type  Past Medical History:  Past Medical History:  Diagnosis Date  . Bipolar 1 disorder (HCC)   . Depression   . Schizophrenia (HCC)     No past surgical history on file.  Family History:  Family History  Problem Relation Age of Onset  . Hypertension Father     Social History:  reports that she has never smoked. She has never used smokeless tobacco. She reports that she does not drink  alcohol or use drugs.  Additional Social History:  Alcohol / Drug Use Pain Medications: See MARs Prescriptions: See MARs Over the Counter: See MARs History of alcohol / drug use?: No history of alcohol / drug abuse  CIWA:   COWS:    Allergies:  Allergies  Allergen Reactions  . Peanut-Containing Drug Products Nausea And Vomiting  . Pork-Derived Products Other (See Comments) and Rash    Religious purposes Religious purposes    Home Medications: (Not in a hospital admission)   OB/GYN Status:  No LMP recorded.  General Assessment Data Location of Assessment: [facility] Assessment Services TTS Assessment: In system Is this a Tele or Face-to-Face Assessment?: Face-to-Face Is this an Initial Assessment or a Re-assessment for this encounter?: Initial Assessment Patient Accompanied by:: N/A Language Other than English: No Living Arrangements: Other (Comment) What gender do you identify as?: Female Marital status: Married(Pt reports being married) [place] name: Midou Pregnancy Status: Unknown Living Arrangements: Spouse/significant other Can pt return to current living arrangement?: Yes Admission Status: Involuntary Petitioner: Other([facility]) Is patient capable of signing voluntary admission?: No(PT was IVC'd) Referral Source: Psychiatrist  Medical Screening Exam [facility] Walk-in ONLY) Medical Exam completed: Yes  Crisis Care Plan Living Arrangements: Spouse/significant other Legal Guardian: Other:(self) Name of Psychiatrist: Dr Cammy Copa, MD Name of Therapist: [facility]  Education Status Is patient currently in school?: No Is the patient employed, unemployed or receiving disability?: Unemployed(Pt reports she works at home)  Risk  to self with the past 6 months Suicidal Ideation: No-Not Currently/Within Last 6 Months Has patient been a risk to self within the past 6 months prior to admission? : Yes Suicidal Intent: No Has patient had any suicidal intent within the past 6 months prior to  admission? : No(Pt do not want to go to hell) Is patient at risk for suicide?: No Suicidal Plan?: No Has patient had any suicidal plan within the past 6 months prior to admission? : No Access to Means: No What has been your use of drugs/alcohol within the last 12 months?: NA Previous Attempts/Gestures: No Triggers for Past Attempts: Hallucinations Intentional Self Injurious Behavior: None Family Suicide History: Unknown Recent stressful life event(s): Other (Comment)(life) Persecutory voices/beliefs?: Yes Depression: Yes Depression Symptoms: Guilt, Feeling worthless/self pity Substance abuse history and/or treatment for substance abuse?: No Suicide prevention information given to non-admitted patients: Not applicable  Risk to Others within the past 6 months Homicidal Ideation: No Does patient have any lifetime risk of violence toward others beyond the six months prior to admission? : No Thoughts of Harm to Others: No Current Homicidal Intent: No Current Homicidal Plan: No Access to Homicidal Means: No History of harm to others?: No Assessment of Violence: None Noted Does patient have access to weapons?: No Criminal Charges Pending?: No Does patient have a court date: No Is patient on probation?: No  Psychosis Hallucinations: Auditory, With command Delusions: Persecutory  Mental Status Report Appearance/Hygiene: Revealing clothes/seductive clothing Eye Contact: Fair Motor Activity: Hyperactivity Speech: Incoherent, Rapid Level of Consciousness: Alert Mood: Depressed, Elated Affect: Depressed, Inconsistent with thought content, Preoccupied Anxiety Level: None Thought Processes: Flight of Ideas Judgement: Partial Orientation: Person, Not oriented Obsessive Compulsive Thoughts/Behaviors: None  Cognitive Functioning Concentration: Decreased Memory: Recent Impaired, Remote Impaired Is patient IDD: No Insight: Poor Impulse Control: Fair Appetite: Fair Have you had any  weight changes? : No Change Sleep: Decreased Total Hours of Sleep: 4 Vegetative Symptoms: None  ADLScreening [facility] Assessment Services) Patient's cognitive ability adequate to safely complete daily activities?: Yes Patient able to express need for assistance with ADLs?: Yes Independently performs ADLs?: Yes (appropriate for developmental age)  Prior Inpatient Therapy Prior Inpatient Therapy: No  Prior Outpatient Therapy Prior Outpatient Therapy: Yes Prior Therapy Dates: ongoing Prior Therapy Facilty/Provider(s): [facility] Reason for Treatment: MH Does patient have an ACCT team?: No Does patient have Intensive In-House Services?  : No Does patient have [facility] services? : No Does patient have P4CC services?: No  ADL Screening (condition at time of admission) Patient's cognitive ability adequate to safely complete daily activities?: Yes Is the patient deaf or have difficulty hearing?: No Does the patient have difficulty seeing, even when wearing glasses/contacts?: No Does the patient have difficulty concentrating, remembering, or making decisions?: No Patient able to express need for assistance with ADLs?: Yes Does the patient have difficulty dressing or bathing?: No Independently performs ADLs?: Yes (appropriate for developmental age) Does the patient have difficulty walking or climbing stairs?: No Weakness of Legs: None Weakness of Arms/Hands: None  Home Assistive Devices/Equipment Home Assistive Devices/Equipment: None    Abuse/Neglect Assessment (Assessment to be complete while patient is alone) Abuse/Neglect Assessment Can Be Completed: Yes Physical Abuse: Denies Verbal Abuse: Denies Sexual Abuse: Denies Exploitation of patient/patient's resources: Denies Self-Neglect: Denies Values / Beliefs Cultural Requests During Hospitalization: Diet (comment)(Pt is [place]) Spiritual Requests During Hospitalization: Rites/Rituals   Advance Directives (For Healthcare) Does Patient  Have a Medical Advance Directive?: No Would patient like information on creating a  medical advance directive?: No - Patient declined          [facility]: [facility] discussed case with BH Provider, Elta Guadeloupe, NP who recommends inpatient treatment.  TTS will look for inpatient placement.   Disposition Initial Assessment Completed for this Encounter: Yes Disposition of Patient: Admit(Per Dwana Melena, NP) Type of inpatient treatment program: Adult Patient refused recommended treatment: No Mode of transportation if patient is discharged/movement?: N/A Patient referred to: Other (Comment)(Look for placement)  On Site Evaluation by:   Reviewed with Physician:    Tyron Russell, MS, [facility], NCC 10/23/2018 1:55 PM

## 2018-10-23 NOTE — Progress Notes (Signed)
Patient ID: Barbara George, female   DOB: [birth year], 23 y.o.   MRN: [id number]  [place] NOVEL CORONAVIRUS (COVID-19) DAILY CHECK-OFF SYMPTOMS - answer yes or no to each - every day NO YES  Have you had a fever in the past 24 hours?  . Fever (Temp > 37.80C / 100F) X   Have you had any of these symptoms in the past 24 hours? . New Cough .  Sore Throat  .  Shortness of Breath .  Difficulty Breathing .  Unexplained Body Aches   X   Have you had any one of these symptoms in the past 24 hours not related to allergies?   . Runny Nose .  Nasal Congestion .  Sneezing   X   If you have had runny nose, nasal congestion, sneezing in the past 24 hours, has it worsened?  X   EXPOSURES - check yes or no X   Have you traveled outside the state in the past 14 days?  X   Have you been in contact with someone with a confirmed diagnosis of COVID-19 or PUI in the past 14 days without wearing appropriate PPE?  X   Have you been living in the same home as a person with confirmed diagnosis of COVID-19 or a PUI (household contact)?    X   Have you been diagnosed with COVID-19?    X              What to do next: Answered NO to all: Answered YES to anything:   Proceed with unit schedule Follow the [facility] Inpatient Flowsheet.

## 2018-10-23 NOTE — H&P (Addendum)
Behavioral Health Medical Screening Exam  Barbara George is an 23 y.o. female.Pt presented to [facility] with [facility], under IVC. Pt is disorganized and delusional. Pt has thoughts of suicide but stated she can not tell me her plan. Per IVC paperwork, Pt is worried she will be killed by voodoo and her plan is to take too many pills. Pt is hyper religous and is seen praying in the assessment room. Pt appears anxious and paranoid. Pt is recommended for an inpatient admission for stabilization. Will admit to OBS unit until a bed is available.   Total Time spent with patient: 30 minutes  Psychiatric Specialty Exam: Physical Exam  Constitutional: She appears well-developed and well-nourished.  HENT:  Head: Normocephalic.  Respiratory: Effort normal.  Musculoskeletal: Normal range of motion.  Neurological: She is alert.  Psychiatric: Her mood appears anxious. She is actively hallucinating. Thought content is paranoid and delusional.    Review of Systems  Psychiatric/Behavioral: Positive for hallucinations and suicidal ideas. The patient is nervous/anxious.   All other systems reviewed and are negative.   There were no vitals taken for this visit.There is no height or weight on file to calculate BMI.  General Appearance: Casual  Eye Contact:  Good  Speech:  Clear and Coherent and Pressured  Volume:  Decreased  Mood:  Anxious  Affect:  Congruent and Constricted  Thought Process:  Disorganized and Descriptions of Associations: Loose  Orientation:  Full (Time, Place, and Person)  Thought Content:  Illogical, Delusions and Ideas of Reference:   Paranoia  Suicidal Thoughts:  Yes.  with intent/plan  Homicidal Thoughts:  No  Memory:  Immediate;   Fair Recent;   Fair Remote;   Fair  Judgement:  Impaired  Insight:  Shallow  Psychomotor Activity:  Normal  Concentration: Concentration: Fair and Attention Span: Fair  Recall:  [employer] of Knowledge:Fair  Language: Good  Akathisia:  Negative   Handed:  Right  AIMS (if indicated):     Assets:  Architect Housing  Sleep:       Musculoskeletal: Strength & Muscle Tone: within normal limits Gait & Station: normal Patient leans: N/A  There were no vitals taken for this visit.  Recommendations:  Based on my evaluation the patient does not appear to have an emergency medical condition.  Laveda Abbe, NP 10/23/2018, 1:11 PM

## 2018-10-23 NOTE — Progress Notes (Signed)
Patient ID: Barbara George, female   DOB: 1995/07/09, 23 y.o.   MRN: [id number]   Pt admitted to triage observation via [facility].  "Pt admits to having suicidal thoughts. Pt states "yes, I want to kill myself but I don't want to go to hell."  Pt begin to speak about her religion and states "only Allah is perfect.  I am only happy at a 9 not a 10.  I am also sad."  Pt denies HI/SA/A/V-hallucinations.  Pt resides with her husband and family.  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  Pt receives outpatient MH services at [facility] in [facility]   Patient was wearing casual clothes without a bra and appeared inappropriately groomed.  Pt was alert throughout the assessment.  Patient made fair eye contact and had normal psychomotor activity.  Patient spoke in a normal voice with pressured speech.  Pt expressed feeling happy and sad.  Pt's affect appeared euphoric and incongruent with stated mood. Pt's thought process was tangential.  Pt presented with partial insight and judgement.  Pt did not appear to be responding to internal stimuli.  Pt was not able to reliably contract for safety."  Education, support, reassurance, and encouragement provided, q15 minute safety checks initiated. Pt's belongings in sealed bag. Pt ambulating on the unit with no issues. Pt remains safe on and off the unit.

## 2018-10-23 NOTE — Progress Notes (Signed)
Pt accepted to  [facility]; main campus Dr. Estill Cotta is the accepting/attending provider.   Call report to [phone] [facility] OBS notified.    Pt is involuntary and will be transported by law enforcement Pt is scheduled to arrive at [facility] after 8am on 10/24/18  Wells Guiles, LCSW, LCAS Disposition CSW [facility]/TTS [phone]

## 2018-10-23 NOTE — Progress Notes (Signed)
[facility] Observation Crisis Plan  Reason for Crisis Plan:  Crisis Stabilization and Medication Management   Plan of Care:  Referral for Inpatient Hospitalization  Family Support:      Current Living Environment:  Living Arrangements: Spouse/significant other  Insurance:   Hospital Account    Name Acct ID Class Status Primary Coverage   Midou Amberley, [place] [id number] BEHAVIORAL HEALTH OBSERVATION Open [place]        Guarantor Account (for Hospital Account [id number])    Name Relation to Pt Service Area Active? Acct Type   Midou Hamadou, Marlise Self [facility] Yes [facility]   Address Phone       [address] [place], [place] [address] [phone](H)          Coverage Information (for Hospital Account [id number])    F/O Payor/Plan Precert #   [facility] [place]    Subscriber Subscriber #   Midou Rondia, Charley [id number]   Address Phone   [address] [place], [place] [address] [phone]      Legal Guardian:  Legal Guardian: Other:(self)  Primary Care Provider:  Patient, No Pcp Per  Current Outpatient Providers:  n/a  Psychiatrist:  Name of Psychiatrist: Dr Cammy Copa, MD  Counselor/Therapist:  Name of Therapist: [facility]  Compliant with Medications:  No  Additional Information:   Tania Ade 5/21/20208:26 PM

## 2018-10-24 DIAGNOSIS — F250 Schizoaffective disorder, bipolar type: Secondary | ICD-10-CM | POA: Diagnosis not present

## 2018-10-24 MED ORDER — DIPHENHYDRAMINE HCL 50 MG/ML IJ SOLN
INTRAMUSCULAR | Status: AC
  Administered 2018-10-24: 05:00:00 50 mg via INTRAMUSCULAR
  Filled 2018-10-24: qty 1

## 2018-10-24 MED ORDER — DIPHENHYDRAMINE HCL 25 MG PO CAPS: 50.0000 mg | ORAL_CAPSULE | Freq: Once | ORAL | Status: AC

## 2018-10-24 MED ORDER — DIPHENHYDRAMINE HCL 50 MG/ML IJ SOLN
50.0000 mg | Freq: Once | INTRAMUSCULAR | Status: AC
  Administered 2018-10-24: 50 mg via INTRAMUSCULAR

## 2018-10-24 MED ORDER — ZIPRASIDONE MESYLATE 20 MG IM SOLR
INTRAMUSCULAR | Status: AC
  Administered 2018-10-24: 20 mg via INTRAMUSCULAR
  Filled 2018-10-24: qty 20

## 2018-10-24 MED ORDER — LORAZEPAM 2 MG/ML IJ SOLN
INTRAMUSCULAR | Status: AC
  Administered 2018-10-24: 05:00:00 2 mg via INTRAMUSCULAR
  Filled 2018-10-24: qty 1

## 2018-10-24 MED ORDER — ZIPRASIDONE MESYLATE 20 MG IM SOLR
20.0000 mg | Freq: Once | INTRAMUSCULAR | Status: AC
  Administered 2018-10-24: 20 mg via INTRAMUSCULAR

## 2018-10-24 MED ORDER — TRAZODONE HCL 50 MG PO TABS
ORAL_TABLET | ORAL | Status: AC
  Administered 2018-10-24: 50 mg via ORAL
  Filled 2018-10-24: qty 1

## 2018-10-24 MED ORDER — LORAZEPAM 2 MG/ML IJ SOLN
2.0000 mg | Freq: Once | INTRAMUSCULAR | Status: AC
  Administered 2018-10-24: 2 mg via INTRAMUSCULAR

## 2018-10-24 MED ORDER — DIPHENHYDRAMINE HCL 50 MG/ML IJ SOLN: 50.0000 mg | Freq: Once | INTRAMUSCULAR | Status: DC

## 2018-10-24 NOTE — Progress Notes (Signed)
[place] NOVEL CORONAVIRUS (COVID-19) DAILY CHECK-OFF SYMPTOMS - answer yes or no to each - every day NO YES  Have you had a fever in the past 24 hours?  . Fever (Temp > 37.80C / 100F) X   Have you had any of these symptoms in the past 24 hours? . New Cough .  Sore Throat  .  Shortness of Breath .  Difficulty Breathing .  Unexplained Body Aches   X   Have you had any one of these symptoms in the past 24 hours not related to allergies?   . Runny Nose .  Nasal Congestion .  Sneezing   X   If you have had runny nose, nasal congestion, sneezing in the past 24 hours, has it worsened?  X   EXPOSURES - check yes or no X   Have you traveled outside the state in the past 14 days?  X   Have you been in contact with someone with a confirmed diagnosis of COVID-19 or PUI in the past 14 days without wearing appropriate PPE?  X   Have you been living in the same home as a person with confirmed diagnosis of COVID-19 or a PUI (household contact)?    X   Have you been diagnosed with COVID-19?    X              What to do next: Answered NO to all: Answered YES to anything:   Proceed with unit schedule Follow the [facility] Inpatient Flowsheet.   

## 2018-10-24 NOTE — Progress Notes (Signed)
D: Pt A & O X 3. Denies SI, HI, AVH and pain at this when assessed. Presents preoccupied and tangential on interactions. Picked up in lobby by [employer], transferred to [facility] for inpatient treatment.  A: D/C instructions reviewed with pt; compliance encouraged. All belongings from assigned locker given to pt at time of departure. Safety checks maintained without incident till time of d/c.  R: Pt receptive to care. Verbalized understanding related to d/c instructions. Signed belonging sheet in agreement with items received from locker. Ambulatory with a steady gait. Appears to be in no physical distress at time of departure.

## 2018-10-24 NOTE — Discharge Summary (Signed)
Patient transferred to [facility] for inpatient psychiatric treatment.  Orders entered by this provider for discharge; did not see patient face to face.   Moraima Burd B. Zavier Canela, NP

## 2018-10-24 NOTE — Progress Notes (Signed)
Pt. Is awake, being disruptive on the unit.  Attempted to verbally redirect patient who then became more agitated.  PA Simon present on the unit, ordered IM medication which patient was agreeable to and  was administered without incident.

## 2018-10-24 NOTE — Progress Notes (Signed)
D: Patient presents as anxious and intermittently agitated, especially when she feels her requests are not met. She is intrusive but redirectable and is visible interacting with the others on the unit.   A: Patient given emotional support from RN. Patient encouraged to come to staff with concerns and/or questions. Patient's medication routine continued. Patient's orders and plan of care reviewed.   R: Patient remains appropriate and cooperative. Will continue to monitor patient q15 minutes for safety.

## 2019-03-19 ENCOUNTER — Encounter: Payer: Self-pay | Admitting: General Practice

## 2019-04-03 ENCOUNTER — Other Ambulatory Visit: Payer: Self-pay

## 2019-04-03 ENCOUNTER — Ambulatory Visit: Payer: Medicaid Other | Admitting: Obstetrics & Gynecology

## 2019-04-03 ENCOUNTER — Encounter: Payer: Self-pay | Admitting: Obstetrics & Gynecology

## 2019-04-03 VITALS — BP 131/81 | HR 83 | Temp 98.4°F | Ht 68.0 in | Wt 227.6 lb

## 2019-04-03 DIAGNOSIS — N926 Irregular menstruation, unspecified: Secondary | ICD-10-CM | POA: Diagnosis not present

## 2019-04-03 NOTE — Progress Notes (Signed)
Subjective:     Barbara George is a 23 y.o. female G0 here for eval of irreg cycles. Current complaints: Pt has been married for 2 years. She was never sexually active prior to her marriage. She reports that her menses became irreg 2 years prev. Her cycles come monthly but, may delayed. Her cycles lasts 5 days. Pt reports sx around the time of her cycles. Bresast tenderness and bloating.  Pt has been on Abilify injections for 2 years.  Pt does not have the exact dates of her cycles.        Gynecologic History Patient's last menstrual period was 03/31/2019 (exact date). Contraception: none Last [facility]: [facility]. Results were: normal Last mammogram: n/a.  Obstetric History OB History  Gravida Para Term Preterm AB Living  0 0 0 0 0 0  SAB TAB Ectopic Multiple Live Births  0 0 0 0 0   The following portions of the patient's history were reviewed and updated as appropriate: allergies, current medications, past family history, past medical history, past social history, past surgical history and problem list.  Review of Systems Pertinent items are noted in HPI.    Current Outpatient Medications on File Prior to Visit  Medication Sig Dispense Refill  . ARIPiprazole ER 400 MG SRER Inject 400 mg into the muscle every 28 (twenty-eight) days. 1 each 0  . dicyclomine (BENTYL) 20 MG tablet Take 1 tablet (20 mg total) by mouth 2 (two) times daily. 20 tablet 0   No current facility-administered medications on file prior to visit.    Objective:  BP 131/81 (BP Location: Right Arm, Patient Position: Sitting, Cuff Size: Normal)   Pulse 83   Temp 98.4 F (36.9 C) (Oral)   Ht 5\' 8"  (1.727 m)   Wt 227 lb 9.6 oz (103.2 kg)   LMP 03/31/2019 (Exact Date)   BMI 34.61 kg/m    CONSTITUTIONAL: Well-developed, well-nourished female in no acute distress.  HENT:  Normocephalic, atraumatic EYES: Conjunctivae and EOM are normal. No scleral icterus.  NECK: Normal  range of motion SKIN: Skin is warm and dry. No rash noted. Not diaphoretic.No pallor. [place]: Alert and oriented to person, place, and time. Normal coordination.    Assessment:     Irreg menses- after discussion with pt, I an not clear if this is not just a normal variation of menses. Her cycles could certainly be altered by her current meds. I have explained that to her and asked her to keep a journal for a  More accurate assessment of her cycles.  Plan:   Keep menstrual journal for 3 months F/u to discuss cycles.  Menstrual calendar given to pt and reviewed use.  Need P[facility] for [facility]  Total face-to-face time with patient was 20 min.  Greater than 50% was spent in counseling and coordination of care with the patient.

## 2019-04-03 NOTE — Patient Instructions (Signed)
Menstruation    Menstruation, also known as a menstrual period, is the monthly shedding of the lining of the uterus. The uterus is the organ in the lower abdomen where a baby grows during pregnancy. Menstruation involves the passing of blood, tissue, fluid, and mucus. The flow of blood usually occurs during 3-7 consecutive days each month.  Girls usually start their periods between the ages of 12 and 14, but some girls may be older or younger when they start their period. Some girls have regular monthly menstrual cycles right from the beginning. However, it is not unusual to have only a couple of drops of blood or spotting when first starting to have periods. It is also not unusual to have two periods a month or miss a month or two when first starting to have periods. Women will continue to have periods until they reach menopause, which usually occurs between the ages of 48 and 55.  What are the symptoms?  During your period, you pass blood, tissue, fluid, and mucus out of your vagina. Periods are different for each woman and girl. You may experience:  · Bleeding that lasts for 3-7 days. A little more or less bleeding is normal.  · Occasional heavy bleeding.  · Cramps in the lower abdomen.  · Aching or pain in the lower back area.  · Sore breasts.  · Dizziness.  · Nausea.  · Diarrhea.  Other symptoms may occur 5-10 days before your menstrual period starts. These symptoms are referred to as premenstrual syndrome (PMS). These symptoms can include:  · Headache.  · Breast tenderness and swelling.  · Bloating.  · Tiredness (fatigue).  · Mood changes.  · Craving for certain foods.  How does the menstrual cycle happen?  A period is part of a woman's menstrual cycle, which is a series of changes that the body goes through to get ready to become pregnant. The menstrual cycle usually lasts about 28 days, meaning that you will get your period about every 28 days if you do not get pregnant. However, some women get their periods  as soon as every 21 days or as late as every 35 days.  Hormones control the menstrual cycle. Hormones are chemicals that the body produces to regulate different body functions. These hormones trigger changes in your uterus. Every month, the lining of your uterus gets thicker to prepare for pregnancy. And every month that you do not get pregnant, your uterus gets rid of its thick lining and cleans itself out. This is your period.  How do I know if my period is not normal?  Periods are different for everyone. Your period may last for a longer or shorter time than usual, and bleeding may be light or heavy.  Signs that your period may not be normal include:  · Bleeding very heavily, such as soaking through a tampon or pad in 1-2 hours.  · Bleeding for many more days than normal.  · Bleeding after you have sex.  · Cramps that are so painful that you cannot do your daily activities.  · Cramps that get much worse than they used to be.  · Bleeding in between periods.  · Missing your period for longer than 3 months.  · Your menstrual cycle becoming irregular, when it used to be regular.  Follow these instructions at home:  · Keep track of your periods by using a calendar.  · If you use tampons, use the least absorbent possible to avoid complications such   over-the-counter pain reliever as told by your health care provider. ? Use a heating pad or heat wrap on your abdomen to ease cramping. ? Exercise 3-5 times a week or more. ? Avoid foods and drinks that you know will make your symptoms worse before or during your period. This includes foods that contain:  Caffeine.  Salt.  Sugar. Contact a health care provider if:  You have signs that your period may not be normal.  You develop a fever with your period.  Your  periods are lasting more than 7 days.  You develop clots with your period and never had clots before.  You cannot get relief for your symptoms from over-the-counter medicine.  Your period has not started, and it has been longer than 35 days. Get help right away if:  Your period is so heavy that you have to change pads or tampons every 30 minutes.  You have any symptoms of toxic shock syndrome (TSS), such as: ? A high fever. ? Vomiting or diarrhea. ? Red skin that looks like a sunburn. ? Red eyes. ? Fainting or feeling dizzy. ? Sore throat. ? Muscle aches. If you develop any of these symptoms, visit your health care provider immediately. TSS is a serious health condition that can be caused by wearing a tampon for too long. Summary  Menstruation, also known as a menstrual period, is the monthly shedding of the lining of the uterus.  During your period, you pass blood, tissue, fluid, and mucus out of your vagina.  Keep track of your periods by using a calendar.  Contact a health care provider if you have signs that your period may not be normal. This information is not intended to replace advice given to you by your health care provider. Make sure you discuss any questions you have with your health care provider. Document Released: 05/11/2002 Document Revised: 05/03/2017 Document Reviewed: 07/18/2016 Elsevier Patient Education  2020 Elsevier Inc.  

## 2019-04-10 ENCOUNTER — Telehealth: Payer: Self-pay | Admitting: *Deleted

## 2019-04-10 NOTE — Telephone Encounter (Signed)
Patient called to report what medication she is currently taking. Medications listed below:  Divalproex 500 mg BID Olanzapine 10 BID Docusate Sodium 100 mg BID  Derl Barrow, RN

## 2019-04-27 ENCOUNTER — Encounter: Payer: Self-pay | Admitting: General Practice

## 2019-07-02 ENCOUNTER — Encounter: Payer: Self-pay | Admitting: Obstetrics and Gynecology

## 2019-07-02 ENCOUNTER — Ambulatory Visit: Payer: Medicaid Other | Admitting: Obstetrics and Gynecology

## 2019-07-02 ENCOUNTER — Other Ambulatory Visit: Payer: Self-pay

## 2019-07-02 VITALS — BP 127/76 | HR 106 | Temp 98.1°F | Ht 68.0 in | Wt 222.4 lb

## 2019-07-02 DIAGNOSIS — N939 Abnormal uterine and vaginal bleeding, unspecified: Secondary | ICD-10-CM | POA: Diagnosis not present

## 2019-07-02 MED ORDER — MEDROXYPROGESTERONE ACETATE 5 MG PO TABS: 5.0000 mg | ORAL_TABLET | Freq: Every day | ORAL | 0 refills | Status: DC

## 2019-07-02 NOTE — Progress Notes (Signed)
  GYNECOLOGY PROGRESS NOTE  History:  [name] is a 24 y.o. G0P0000 presents to [facility] office today for problem gyn visit. She reports irregular menses. She brought in a menstrual calendar given to her by Dr. Erin Fulling at her last visit (see scanned media tab). She expresses a desire to be pregnant as she and her husband have been TC for 2 years. She reports She denies h/a, dizziness, shortness of breath, n/v, or fever/chills.    The following portions of the patient's history were reviewed and updated as appropriate: allergies, current medications, past family history, past medical history, past social history, past surgical history and problem list.  Review of Systems:  Pertinent items are noted in HPI.   Objective:  Physical Exam Blood pressure 127/76, pulse (!) 106, temperature 98.1 F (36.7 C), temperature source Oral, height 5\' 8"  (1.727 m), weight 222 lb 6.4 oz (100.9 kg), last menstrual period 06/26/2019. VS reviewed, nursing note reviewed,  Constitutional: well developed, well nourished, no distress HEENT: normocephalic CV: normal rate Pulm/chest wall: normal effort Breast Exam: deferred Abdomen: soft Neuro: alert and oriented x 3 Skin: warm, dry Psych: affect normal Pelvic exam: declined  Assessment & Plan:  Abnormal uterine bleeding (AUB) - After review of menstrual calendar, period this month technically did not start until 1/26; making today the 3rd day -- not abnormal. She had some spotting for 4 days prior to normal flow. - Discussed with patient that her irregularity in her menses started the same time she started taking Abilify injections - Advised that her medications could be decreasing her reproductive ability and making it harder to conceive. She expresses a desire to be pregnant - Will Rx Provera to stop prolonged bleeding if still bleeding Sunday 07/05/19.  - Follow-up with MD in 2 months   There was 100% of the 15 minute  encounter spent counseling and planning follow-up.   07/07/19, CNM 10:50 AM

## 2019-07-02 NOTE — Patient Instructions (Signed)
Abnormal Uterine Bleeding °Abnormal uterine bleeding is unusual bleeding from the uterus. It includes: °· Bleeding or spotting between periods. °· Bleeding after sex. °· Bleeding that is heavier than normal. °· Periods that last longer than usual. °· Bleeding after menopause. °Abnormal uterine bleeding can affect women at various stages in life, including teenagers, women in their reproductive years, pregnant women, and women who have reached menopause. Common causes of abnormal uterine bleeding include: °· Pregnancy. °· Growths of tissue (polyps). °· A noncancerous tumor in the uterus (fibroid). °· Infection. °· Cancer. °· Hormonal imbalances. °Any type of abnormal bleeding should be evaluated by a health care provider. Many cases are minor and simple to treat, while others are more serious. Treatment will depend on the cause of the bleeding. °Follow these instructions at home: °· Monitor your condition for any changes. °· Do not use tampons, douche, or have sex if told by your health care provider. °· Change your pads often. °· Get regular exams that include pelvic exams and cervical cancer screening. °· Keep all follow-up visits as told by your health care provider. This is important. °Contact a health care provider if: °· Your bleeding lasts for more than one week. °· You feel dizzy at times. °· You feel nauseous or you vomit. °Get help right away if: °· You pass out. °· Your bleeding soaks through a pad every hour. °· You have abdominal pain. °· You have a fever. °· You become sweaty or weak. °· You pass large blood clots from your vagina. °Summary °· Abnormal uterine bleeding is unusual bleeding from the uterus. °· Any type of abnormal bleeding should be evaluated by a health care provider. Many cases are minor and simple to treat, while others are more serious. °· Treatment will depend on the cause of the bleeding. °This information is not intended to replace advice given to you by your health care provider.  Make sure you discuss any questions you have with your health care provider. °Document Revised: 08/28/2017 Document Reviewed: 06/22/2016 °Elsevier Patient Education © 2020 Elsevier Inc. ° °

## 2019-07-06 ENCOUNTER — Encounter: Payer: Self-pay | Admitting: General Practice

## 2019-07-15 ENCOUNTER — Ambulatory Visit (HOSPITAL_COMMUNITY)
Admission: EM | Admit: 2019-07-15 | Discharge: 2019-07-15 | Disposition: A | Discharge status: Home / Self Care | Payer: Medicaid Other | Attending: Urgent Care | Admitting: Urgent Care

## 2019-07-15 ENCOUNTER — Other Ambulatory Visit: Payer: Self-pay

## 2019-07-15 ENCOUNTER — Encounter (HOSPITAL_COMMUNITY): Payer: Self-pay

## 2019-07-15 DIAGNOSIS — Z3202 Encounter for pregnancy test, result negative: Secondary | ICD-10-CM

## 2019-07-15 DIAGNOSIS — R102 Pelvic and perineal pain: Secondary | ICD-10-CM

## 2019-07-15 DIAGNOSIS — N939 Abnormal uterine and vaginal bleeding, unspecified: Secondary | ICD-10-CM | POA: Diagnosis not present

## 2019-07-15 LAB — POCT PREGNANCY, URINE: Preg Test, Ur: NEGATIVE

## 2019-07-15 LAB — POC URINE PREG, ED
Preg Test, Ur: NEGATIVE
Preg Test, Ur: NEGATIVE

## 2019-07-15 MED ORDER — MEGESTROL ACETATE 40 MG PO TABS: 40.0000 mg | ORAL_TABLET | Freq: Every day | ORAL | 0 refills | Status: DC

## 2019-07-15 NOTE — ED Provider Notes (Signed)
[facility]   MRN: [id number] DOB: 08-19-1995  Subjective:   Barbara George is a 24 y.o. female presenting for 1 month hx of persistent vaginal bleeding. Symptoms were heavy bleeding initially but after talking with her gynecologist was given Provera at 5mg  for 10 days. Her bleeding has improved and is light now but was advised to come in for a check. Has had mild intermittent pelvic cramping.  Has not taken a pregnancy test in 2 months and states last one was negative.  No current facility-administered medications for this encounter.  Current Outpatient Medications:  .  docusate sodium (COLACE) 100 MG capsule, Take 100 mg by mouth 2 (two) times daily as needed for mild constipation., Disp: , Rfl:  .  medroxyPROGESTERone (PROVERA) 5 MG tablet, Take 1 tablet (5 mg total) by mouth daily., Disp: 10 tablet, Rfl: 0 .  OLANZapine (ZYPREXA) 10 MG tablet, Take 10 mg by mouth at bedtime., Disp: , Rfl:    Allergies  Allergen Reactions  . Peanut-Containing Drug Products Nausea And Vomiting  . Pork-Derived Products Other (See Comments) and Rash    Religious purposes Religious purposes    Past Medical History:  Diagnosis Date  . Bipolar 1 disorder ([place])   . Depression   . 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 Schizophrenia ([place])      History reviewed. No pertinent surgical history.  Family History  Problem Relation Age of Onset  . Hypertension Father     Social History   Tobacco Use  . Smoking status: Never Smoker  . Smokeless tobacco: Never Used  Substance Use Topics  . Alcohol use: No    Alcohol/week: 0.0 standard drinks  . Drug use: No    ROS   Objective:   Vitals: BP 114/80 (BP Location: Left Arm)   Pulse 88   Temp 98.6 F (37 C) (Oral)   Resp 18   LMP 06/26/2019 (Exact Date)   SpO2 100%   Physical Exam Constitutional:      General: She is not in acute distress.    Appearance: Normal appearance. She is  well-developed and normal weight. She is not ill-appearing, toxic-appearing or diaphoretic.  HENT:     Head: Normocephalic and atraumatic.     Right Ear: External ear normal.     Left Ear: External ear normal.     Nose: Nose normal.     Mouth/Throat:     Mouth: Mucous membranes are moist.     Pharynx: Oropharynx is clear.  Eyes:     General: No scleral icterus.    Extraocular Movements: Extraocular movements intact.     Pupils: Pupils are equal, round, and reactive to light.  Cardiovascular:     Rate and Rhythm: Normal rate and regular rhythm.     Heart sounds: Normal heart sounds. No murmur. No friction rub. No gallop.   Pulmonary:     Effort: Pulmonary effort is normal. No respiratory distress.     Breath sounds: Normal breath sounds. No stridor. No wheezing, rhonchi or rales.  Abdominal:     General: Bowel sounds are normal. There is no distension.     Palpations: Abdomen is soft. There is no mass.     Tenderness: There is abdominal tenderness in the left lower quadrant. There is no right CVA tenderness, left CVA tenderness, guarding or rebound.  Skin:    General: Skin is warm and dry.     Coloration: Skin is not  pale.     Findings: No rash.  Neurological:     General: No focal deficit present.     Mental Status: She is alert and oriented to person, place, and time.  Psychiatric:        Mood and Affect: Mood normal.        Behavior: Behavior normal.        Thought Content: Thought content normal.        Judgment: Judgment normal.     Results for orders placed or performed during the hospital encounter of 07/15/19 (from the past 24 hour(s))  POC urine pregnancy     Status: None   Collection Time: 07/15/19  7:34 PM  Result Value Ref Range   Preg Test, Ur NEGATIVE NEGATIVE  Pregnancy, urine POC     Status: None   Collection Time: 07/15/19  7:34 PM  Result Value Ref Range   Preg Test, Ur NEGATIVE NEGATIVE    Assessment and Plan :   1. Abnormal uterine bleeding   2.  Pelvic cramping     Provided patient with a prescription for Megace in case the bleeding picks back up.  Counseled that she may need follow-up sooner with her gynecologist even though her appointment is scheduled 1 month out and her bleeding has improved.  She may end up needing an ultrasound.  For now will have her use naproxen for her mild cramping.  Vital signs and physical exam findings otherwise reassuring and stable for discharge.  Counseled patient on potential for adverse effects with medications prescribed/recommended today, ER and return-to-clinic precautions discussed, patient verbalized understanding.    Wallis Bamberg, [place] 07/15/19 1939

## 2019-07-15 NOTE — ED Triage Notes (Signed)
Pt reports her menstrual period started on 06/26/2019 and haven't stopped since. Pt is keeping track of her period with a calendar gave by her OB/GYN. Pt started taking medroxyprogesterone 10 days to try stop the bleeding without success. Pt reports her OB/GYN suggested to be seeing here, as her next appointment is 08/19/2019.

## 2019-07-16 ENCOUNTER — Telehealth: Payer: Self-pay | Admitting: Licensed Clinical Social Worker

## 2019-07-16 NOTE — Telephone Encounter (Signed)
error 

## 2019-07-21 ENCOUNTER — Telehealth (HOSPITAL_COMMUNITY): Payer: Self-pay | Admitting: Emergency Medicine

## 2019-07-21 NOTE — Telephone Encounter (Signed)
Late entry 16:30:  Patient called asking if there was any other option for vaginal bleeding.  Spoke to dr Delton See.  Dr Delton See reviewed chart.  Patient to call Gynecologist.  Patient agreeable.

## 2019-07-27 ENCOUNTER — Encounter: Payer: Self-pay | Admitting: General Practice

## 2019-07-29 ENCOUNTER — Ambulatory Visit: Payer: Medicaid Other | Admitting: Obstetrics and Gynecology

## 2019-07-29 ENCOUNTER — Encounter: Payer: Self-pay | Admitting: Obstetrics and Gynecology

## 2019-07-29 ENCOUNTER — Other Ambulatory Visit: Payer: Self-pay

## 2019-07-29 ENCOUNTER — Other Ambulatory Visit (HOSPITAL_COMMUNITY)
Admission: RE | Admit: 2019-07-29 | Discharge: 2019-07-29 | Disposition: A | Discharge status: Home / Self Care | Payer: Medicaid Other | Source: Ambulatory Visit | Attending: Obstetrics and Gynecology | Admitting: Obstetrics and Gynecology

## 2019-07-29 VITALS — BP 132/84 | HR 96 | Wt 222.0 lb

## 2019-07-29 DIAGNOSIS — N939 Abnormal uterine and vaginal bleeding, unspecified: Secondary | ICD-10-CM

## 2019-07-29 NOTE — Progress Notes (Signed)
Pt states she has been bleeding since Jan 22. Pt states she is not on any form of birth control.  Pt was seen at hospital on 2/10 and was given Provera and Megace- completed course of meds and bleeding is now spotting.  Pt states occ menstrual pain/cramping.   Pt states this has been a problem since about October- cycles have been 2-3 weeks/month since then.

## 2019-07-29 NOTE — Progress Notes (Signed)
24 yo P0 with BMI 33 who is here for the evaluation of AUB. Patient reports a history of a monthly 5-day period. She states that since October 2020 her periods became irregular, often lasting 2-3 weeks long. She describes then as five days of vaginal bleeding followed by several days of vaginal spotting. Patient reports some mild cramping pain. Patient was recently treated with a course of provera. Patient is sexually active and trying to conceive. She is without any other complaints  Past Medical History:  Diagnosis Date  . Bipolar 1 disorder (HCC)   . Depression   . 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 Schizophrenia ([facility])    History reviewed. No pertinent surgical history. Family History  Problem Relation Age of Onset  . Hypertension Father    Social History   Tobacco Use  . Smoking status: Never Smoker  . Smokeless tobacco: Never Used  Substance Use Topics  . Alcohol use: No    Alcohol/week: 0.0 standard drinks  . Drug use: No   ROS See pertinent in HPI  Blood pressure 132/84, pulse 96, weight 222 lb (100.7 kg), last menstrual period 06/26/2019. GENERAL: Well-developed, well-nourished female in no acute distress.  ABDOMEN: Soft, nontender, nondistended. No organomegaly. PELVIC: Normal external female genitalia. Vagina is pink and rugated.  Normal discharge. Normal appearing cervix. Uterus is normal in size. No adnexal mass or tenderness. EXTREMITIES: No cyanosis, clubbing, or edema, 2+ distal pulses.  A/P 24 yo with AUB - vaginal cultures collected - pelvic ultrasound ordered - Patient will be contacted with abnormal results - Discussed weight loss strategies

## 2019-08-03 LAB — CERVICOVAGINAL ANCILLARY ONLY
Bacterial Vaginitis (gardnerella): POSITIVE — AB
Candida Glabrata: NEGATIVE
Candida Vaginitis: NEGATIVE
Chlamydia: NEGATIVE
Comment: NEGATIVE
Comment: NEGATIVE
Comment: NEGATIVE
Comment: NEGATIVE
Comment: NEGATIVE
Comment: NORMAL
Neisseria Gonorrhea: NEGATIVE
Trichomonas: NEGATIVE

## 2019-08-03 MED ORDER — METRONIDAZOLE 500 MG PO TABS: 500.0000 mg | ORAL_TABLET | Freq: Two times a day (BID) | ORAL | 0 refills | Status: DC

## 2019-08-03 NOTE — Addendum Note (Signed)
Addended by: Catalina Antigua on: 08/03/2019 02:40 PM   Modules accepted: Orders

## 2019-08-05 ENCOUNTER — Other Ambulatory Visit: Payer: Self-pay

## 2019-08-05 ENCOUNTER — Ambulatory Visit: Payer: Medicaid Other | Admitting: Obstetrics and Gynecology

## 2019-08-05 ENCOUNTER — Ambulatory Visit (HOSPITAL_COMMUNITY)
Admission: RE | Admit: 2019-08-05 | Discharge: 2019-08-05 | Disposition: A | Discharge status: Home / Self Care | Payer: Medicaid Other | Source: Ambulatory Visit | Attending: Obstetrics and Gynecology | Admitting: Obstetrics and Gynecology

## 2019-08-05 DIAGNOSIS — N939 Abnormal uterine and vaginal bleeding, unspecified: Secondary | ICD-10-CM | POA: Diagnosis not present

## 2019-08-06 DIAGNOSIS — N939 Abnormal uterine and vaginal bleeding, unspecified: Secondary | ICD-10-CM | POA: Insufficient documentation

## 2019-08-06 DIAGNOSIS — F319 Bipolar disorder, unspecified: Secondary | ICD-10-CM | POA: Insufficient documentation

## 2019-08-11 ENCOUNTER — Telehealth: Payer: Self-pay

## 2019-08-11 NOTE — Telephone Encounter (Signed)
Returned call and answered questions about flagyl, pt stated that she started having brown spotting and is worried that her bleeding may start again. Pt stated that she will call for for appt if bleeding starts.

## 2019-08-19 ENCOUNTER — Ambulatory Visit: Payer: Medicaid Other | Admitting: Obstetrics and Gynecology

## 2019-08-24 ENCOUNTER — Ambulatory Visit (INDEPENDENT_AMBULATORY_CARE_PROVIDER_SITE_OTHER): Payer: Medicaid Other | Admitting: Advanced Practice Midwife

## 2019-08-24 ENCOUNTER — Encounter: Payer: Self-pay | Admitting: Advanced Practice Midwife

## 2019-08-24 ENCOUNTER — Other Ambulatory Visit: Payer: Self-pay

## 2019-08-24 VITALS — BP 132/85 | HR 84 | Wt 226.4 lb

## 2019-08-24 DIAGNOSIS — N939 Abnormal uterine and vaginal bleeding, unspecified: Secondary | ICD-10-CM | POA: Diagnosis not present

## 2019-08-24 DIAGNOSIS — N970 Female infertility associated with anovulation: Secondary | ICD-10-CM

## 2019-08-24 MED ORDER — MEDROXYPROGESTERONE ACETATE 10 MG PO TABS: 10.0000 mg | ORAL_TABLET | Freq: Every day | ORAL | 1 refills | Status: DC

## 2019-08-24 NOTE — Progress Notes (Signed)
  GYNECOLOGY PROGRESS NOTE  History:  24 y.o. G0P0000 presents to [place] office today for problem gyn visit. She reports brown spotting off and on x 2 episodes this month. She was treated for AUB with Provera and Megace in 06/2019 and 07/2019 and reports the bleeding stops while on the medicine.  She denies having a normal or heavy menstrual period this month, only the brown spotting.  She desires pregnancy and has been trying to conceive x 2 years with her husband.  She denies h/a, dizziness, shortness of breath, n/v, or fever/chills.    The following portions of the patient's history were reviewed and updated as appropriate: allergies, current medications, past family history, past medical history, past social history, past surgical history and problem list.  Review of Systems:  Pertinent items are noted in HPI.   Objective:  Physical Exam Blood pressure 132/85, pulse 84, weight 226 lb 6.4 oz (102.7 kg). VS reviewed, nursing note reviewed,  Constitutional: well developed, well nourished, no distress HEENT: normocephalic CV: normal rate Pulm/chest wall: normal effort Breast Exam: deferred Abdomen: soft Neuro: alert and oriented x 3 Skin: warm, dry Psych: affect normal Pelvic exam: Deferred  Assessment & Plan:  1. Abnormal uterine bleeding (AUB) --Likely anovulatory bleeding. --Discussed options to stop irregular bleeding/brown spotting including OCPs, other contraceptives such as IUD, or PO progesterone medications. Pt desires pregnancy so short course of progesterone is preferred.  Will try Provera x 10 days again, progesterone challenge test. After menses, pt to resume trying to become pregnant in the next month.  Refill of Provera if abnormal bleeding resumes. --F/U with MD in 3 months - medroxyPROGESTERone (PROVERA) 10 MG tablet; Take 1 tablet (10 mg total) by mouth daily.  Dispense: 10 tablet; Refill: 1  2. Infertility associated with anovulation --See above, pt to use  ovulation kit after next menses for improved timing.  F/U in 3 months to discuss cycles.  Fatima Blank, CNM 9:10 AM

## 2019-08-24 NOTE — Progress Notes (Signed)
Pt is in the office complaining of brown discharge, denies odor, itching or irritation. Pt states that it previously happened on 08-11-19 and stopped but started again on Wednesday.

## 2019-08-24 NOTE — Patient Instructions (Signed)
Abnormal Uterine Bleeding °Abnormal uterine bleeding is unusual bleeding from the uterus. It includes: °· Bleeding or spotting between periods. °· Bleeding after sex. °· Bleeding that is heavier than normal. °· Periods that last longer than usual. °· Bleeding after menopause. °Abnormal uterine bleeding can affect women at various stages in life, including teenagers, women in their reproductive years, pregnant women, and women who have reached menopause. Common causes of abnormal uterine bleeding include: °· Pregnancy. °· Growths of tissue (polyps). °· A noncancerous tumor in the uterus (fibroid). °· Infection. °· Cancer. °· Hormonal imbalances. °Any type of abnormal bleeding should be evaluated by a health care provider. Many cases are minor and simple to treat, while others are more serious. Treatment will depend on the cause of the bleeding. °Follow these instructions at home: °· Monitor your condition for any changes. °· Do not use tampons, douche, or have sex if told by your health care provider. °· Change your pads often. °· Get regular exams that include pelvic exams and cervical cancer screening. °· Keep all follow-up visits as told by your health care provider. This is important. °Contact a health care provider if: °· Your bleeding lasts for more than one week. °· You feel dizzy at times. °· You feel nauseous or you vomit. °Get help right away if: °· You pass out. °· Your bleeding soaks through a pad every hour. °· You have abdominal pain. °· You have a fever. °· You become sweaty or weak. °· You pass large blood clots from your vagina. °Summary °· Abnormal uterine bleeding is unusual bleeding from the uterus. °· Any type of abnormal bleeding should be evaluated by a health care provider. Many cases are minor and simple to treat, while others are more serious. °· Treatment will depend on the cause of the bleeding. °This information is not intended to replace advice given to you by your health care provider.  Make sure you discuss any questions you have with your health care provider. °Document Revised: 08/28/2017 Document Reviewed: 06/22/2016 °Elsevier Patient Education © 2020 Elsevier Inc. ° °

## 2019-08-31 ENCOUNTER — Ambulatory Visit: Payer: Medicaid Other | Admitting: Advanced Practice Midwife

## 2019-09-02 ENCOUNTER — Telehealth: Payer: Self-pay

## 2019-09-02 NOTE — Telephone Encounter (Signed)
Patient called nurse triage line requesting a call back. Called pt back with use of [place] interpreter. Pt wanted to know if it was normal for her breasts to be sore, she is on day 9 out of 10 of taking provera and expecting her period to start soon. I advised pt that this was normal for some breast tenderness to occur. Pt voices understanding.

## 2019-09-08 ENCOUNTER — Telehealth: Payer: Self-pay | Admitting: *Deleted

## 2019-09-08 NOTE — Telephone Encounter (Signed)
Pt called to office with some questions. Attempt to return call, LM on VM to call office as needed.

## 2019-11-06 ENCOUNTER — Other Ambulatory Visit: Payer: Self-pay

## 2019-11-06 ENCOUNTER — Encounter: Payer: Self-pay | Admitting: Obstetrics and Gynecology

## 2019-11-06 ENCOUNTER — Ambulatory Visit (INDEPENDENT_AMBULATORY_CARE_PROVIDER_SITE_OTHER): Payer: Medicaid Other | Admitting: Obstetrics and Gynecology

## 2019-11-06 VITALS — BP 114/72 | HR 91 | Ht 68.0 in | Wt 223.0 lb

## 2019-11-06 DIAGNOSIS — N939 Abnormal uterine and vaginal bleeding, unspecified: Secondary | ICD-10-CM

## 2019-11-06 NOTE — Progress Notes (Signed)
24 yo P0 here for follow up on AUB. Patient has been followed for this history of irregular cycles. She is trying to conceive. She denies pelvic pain or abnormal discharge. Patient last used a provera challenge course 2 months ago which seems to have stopped the bleeding and regulated her cycle.   Past Medical History:  Diagnosis Date  . Bipolar 1 disorder (HCC)   . Depression   . 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 Schizophrenia ([facility])    No past surgical history on file. Family History  Problem Relation Age of Onset  . Hypertension Father    Social History   Tobacco Use  . Smoking status: Never Smoker  . Smokeless tobacco: Never Used  Substance Use Topics  . Alcohol use: No    Alcohol/week: 0.0 standard drinks  . Drug use: No   ROS See pertinent in HPI. All other systems reviewed and negative  Blood pressure 114/72, pulse 91, height 5\' 8"  (1.727 m), weight 223 lb (101.2 kg), last menstrual period 10/18/2019. GENERAL: Well-developed, well-nourished female in no acute distress.  EXTREMITIES: No cyanosis, clubbing, or edema, 2+ distal pulses.  A/P 24 yo with AUB - Advised patient to start taking prenatal vitamins - Discussed timing of intercourse with ovulation and using ovulation kits and calendar method - Discussed having her husband tested as well - RTC in 6 months if no pregnancy

## 2019-11-06 NOTE — Progress Notes (Signed)
GYN presents for FU to AUB.  Reports no problems today, the Provera helped to stop her bleeding.

## 2020-01-18 ENCOUNTER — Ambulatory Visit: Payer: Medicaid Other | Admitting: Obstetrics and Gynecology

## 2020-02-20 ENCOUNTER — Other Ambulatory Visit: Payer: Self-pay

## 2020-02-20 ENCOUNTER — Ambulatory Visit (HOSPITAL_COMMUNITY)
Admission: EM | Admit: 2020-02-20 | Discharge: 2020-02-20 | Disposition: A | Discharge status: Home / Self Care | Payer: Medicaid Other

## 2020-02-20 ENCOUNTER — Emergency Department (HOSPITAL_COMMUNITY)
Admission: EM | Admit: 2020-02-20 | Discharge: 2020-02-21 | Disposition: A | Discharge status: Home / Self Care | Payer: Medicaid Other | Attending: Emergency Medicine | Admitting: Emergency Medicine

## 2020-02-20 ENCOUNTER — Encounter (HOSPITAL_COMMUNITY): Payer: Self-pay

## 2020-02-20 DIAGNOSIS — E119 Type 2 diabetes mellitus without complications: Secondary | ICD-10-CM | POA: Insufficient documentation

## 2020-02-20 DIAGNOSIS — Z20822 Contact with and (suspected) exposure to covid-19: Secondary | ICD-10-CM | POA: Insufficient documentation

## 2020-02-20 DIAGNOSIS — F22 Delusional disorders: Secondary | ICD-10-CM | POA: Insufficient documentation

## 2020-02-20 DIAGNOSIS — Z91010 Allergy to peanuts: Secondary | ICD-10-CM | POA: Insufficient documentation

## 2020-02-20 DIAGNOSIS — F250 Schizoaffective disorder, bipolar type: Secondary | ICD-10-CM | POA: Insufficient documentation

## 2020-02-20 LAB — CBC WITH DIFFERENTIAL/PLATELET
Abs Immature Granulocytes: 0.02 10*3/uL (ref 0.00–0.07)
Basophils Absolute: 0.1 10*3/uL (ref 0.0–0.1)
Basophils Relative: 1 %
Eosinophils Absolute: 0.1 10*3/uL (ref 0.0–0.5)
Eosinophils Relative: 1 %
HCT: 38.6 % (ref 36.0–46.0)
Hemoglobin: 11.7 g/dL — ABNORMAL LOW (ref 12.0–15.0)
Immature Granulocytes: 0 %
Lymphocytes Relative: 33 %
Lymphs Abs: 2.4 10*3/uL (ref 0.7–4.0)
MCH: 25.9 pg — ABNORMAL LOW (ref 26.0–34.0)
MCHC: 30.3 g/dL (ref 30.0–36.0)
MCV: 85.4 fL (ref 80.0–100.0)
Monocytes Absolute: 0.5 10*3/uL (ref 0.1–1.0)
Monocytes Relative: 7 %
Neutro Abs: 4.2 10*3/uL (ref 1.7–7.7)
Neutrophils Relative %: 58 %
Platelets: 299 10*3/uL (ref 150–400)
RBC: 4.52 MIL/uL (ref 3.87–5.11)
RDW: 13.6 % (ref 11.5–15.5)
WBC: 7.3 10*3/uL (ref 4.0–10.5)
nRBC: 0 % (ref 0.0–0.2)

## 2020-02-20 LAB — COMPREHENSIVE METABOLIC PANEL
ALT: 15 U/L (ref 0–44)
AST: 16 U/L (ref 15–41)
Albumin: 3.8 g/dL (ref 3.5–5.0)
Alkaline Phosphatase: 50 U/L (ref 38–126)
Anion gap: 9 (ref 5–15)
BUN: 9 mg/dL (ref 6–20)
CO2: 26 mmol/L (ref 22–32)
Calcium: 9.2 mg/dL (ref 8.9–10.3)
Chloride: 104 mmol/L (ref 98–111)
Creatinine, Ser: 0.91 mg/dL (ref 0.44–1.00)
GFR calc Af Amer: >60 mL/min (ref >60)
GFR calc non Af Amer: >60 mL/min (ref >60)
Glucose, Bld: 87 mg/dL (ref 70–99)
Potassium: 3.7 mmol/L (ref 3.5–5.1)
Sodium: 139 mmol/L (ref 135–145)
Total Bilirubin: 0.7 mg/dL (ref 0.3–1.2)
Total Protein: 7.3 g/dL (ref 6.5–8.1)

## 2020-02-20 LAB — RAPID URINE DRUG SCREEN, HOSP PERFORMED
Amphetamines: NOT DETECTED
Barbiturates: NOT DETECTED
Benzodiazepines: NOT DETECTED
Cocaine: NOT DETECTED
Opiates: NOT DETECTED
Tetrahydrocannabinol: NOT DETECTED

## 2020-02-20 LAB — SALICYLATE LEVEL: Salicylate Lvl: <7 mg/dL — ABNORMAL LOW (ref 7.0–30.0)

## 2020-02-20 LAB — ETHANOL: Alcohol, Ethyl (B): <10 mg/dL (ref <10)

## 2020-02-20 LAB — CBG MONITORING, ED: Glucose-Capillary: 79 mg/dL (ref 70–99)

## 2020-02-20 LAB — ACETAMINOPHEN LEVEL: Acetaminophen (Tylenol), Serum: <10 ug/mL — ABNORMAL LOW (ref 10–30)

## 2020-02-20 LAB — I-STAT BETA HCG BLOOD, ED (MC, WL, AP ONLY): I-stat hCG, quantitative: <5 m[IU]/mL (ref <5)

## 2020-02-20 NOTE — ED Triage Notes (Addendum)
Husband interpreting for patient .  Patient answers his questions.  Patient has weak responses.  Does follow commands.  Other wise patient sits with eyes closed, easy respirations  husband cannot advise what medication she is taking.  Patient was given something to help with sleep

## 2020-02-20 NOTE — ED Notes (Signed)
Spoke to stephanie, np about patient, patient to go to ED.  Husband agreeable

## 2020-02-20 NOTE — BH Assessment (Signed)
Tele Assessment Note   Patient Name: Barbara George MRN: [id number] Referring Physician: Dietrich Pates, PA-C Location of Patient: [facility], [phone] Location of Provider: Behavioral Health TTS Department  Barbara George is an 24 y.o. married female who presents unaccompanied to [facility] ED due to insomnia and change in mental status. Pt has a diagnosis of schizoaffective disorder and a history of disorganized thought process, paranoid delusions and religious preoccupation. She is a poor historian and often would not respond to questions or would respond by slightly nodding or shaking her head. Pt cannot state why she is in the hospital. Pt told EDP, "I have been following my dreams, I had a dream about God." When asked how her mood is, Pt says "good." Pt acknowledges she has not been sleeping and Pt's medical record indicates a history of insomnia. When asked if she was having suicidal thoughts, Pt nodded in affirmation. When asked if she had a plan, Pt did not respond. Pt's medical record indicates a history of suicidal ideation with deterrent that Pt does not want to go to hell. Pt denies thoughts of harming others. She did not respond when asked if she was experiencing auditory or visual hallucinations. Pt denies using alcohol or other substances and alcohol level and urine drug screen are negative.   Pt lives with her husband and family. Pt's medical record indicates she is originally from [place] and preferred language is [place]. She came to the [place] with her father and 2 younger brothers when she was 55 years old. She tells me that she graduated from high school and has plans to go to college but sickness prevented her from doing so. Her mother still lives in [place].  Pt appears to be receiving outpatient mental health treatment through [facility]. TTS spoke with Pt's husband, Amadou Adamou [phone], who states that he brought her to the ER because she  was complaining of abdominal pain yesterday.  She last saw her psychiatrist on September 2 due to insomnia.  States that "he put her on a medicine to help her sleep but she still acting the same."  He says she is not talking or behaving like herself.   Pt is dressed in hospital scrubs, alert and oriented x4. Pt speaks in a soft tone, at low volume and slow pace. Motor behavior appears normal. Eye contact is minimal and Pt often states at nothing. Pt's mood is anxious and affect is blunted. Thought process is difficult to assess given Pt's lack of communication. Pt insight and judgment appear impaired.   Diagnosis: F25.0 Schizoaffective disorder, Bipolar type  Past Medical History:  Past Medical History:  Diagnosis Date  . Bipolar 1 disorder (HCC)   . Depression   . 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 Schizophrenia (HCC)     History reviewed. No pertinent surgical history.  Family History:  Family History  Problem Relation Age of Onset  . Hypertension Father     Social History:  reports that she has never smoked. She has never used smokeless tobacco. She reports that she does not drink alcohol and does not use drugs.  Additional Social History:  Alcohol / Drug Use Pain Medications: See MARs Prescriptions: See MARs Over the Counter: See MARs History of alcohol / drug use?: No history of alcohol / drug abuse  CIWA: CIWA-Ar BP: 129/82 Pulse Rate: 92 COWS:    Allergies:  Allergies  Allergen Reactions  .  Peanut-Containing Drug Products Nausea And Vomiting  . Pork-Derived Products Other (See Comments) and Rash    Religious purposes Religious purposes    Home Medications: (Not in a hospital admission)   OB/GYN Status:  Patient's last menstrual period was 02/08/2020.  General Assessment Data Location of Assessment: [facility] ED TTS Assessment: In system Is this a Tele or Face-to-Face Assessment?: Tele Assessment Is this an Initial Assessment or a  Re-assessment for this encounter?: Initial Assessment Patient Accompanied by:: N/A Language Other than English: Yes What is your preferred language: [place] Living Arrangements: Other (Comment) (Lives with husband) What gender do you identify as?: Female Date Telepsych consult ordered in [facility]: 02/20/20 Time Telepsych consult ordered in [facility]: 1829 Marital status: Married Artist name: NA Pregnancy Status: No Living Arrangements: Spouse/significant other Can pt return to current living arrangement?: Yes Admission Status: Voluntary Is patient capable of signing voluntary admission?: Yes Referral Source: Self/Family/Friend Insurance type: Medicaid     Crisis Care Plan Living Arrangements: Spouse/significant other Legal Guardian: Other: (Self) Name of Psychiatrist: [facility] Name of Therapist: [facility]  Education Status Is patient currently in school?: No Is the patient employed, unemployed or receiving disability?:  (Unknown)  Risk to self with the past 6 months Suicidal Ideation: Yes-Currently Present Has patient been a risk to self within the past 6 months prior to admission? : Yes Suicidal Intent: No Has patient had any suicidal intent within the past 6 months prior to admission? : No Is patient at risk for suicide?: Yes Suicidal Plan?: No Has patient had any suicidal plan within the past 6 months prior to admission? : No Access to Means: No What has been your use of drugs/alcohol within the last 12 months?: Pt denies Previous Attempts/Gestures: No How many times?: 0 Other Self Harm Risks: None Triggers for Past Attempts: None known Intentional Self Injurious Behavior: None Family Suicide History: Unknown Recent stressful life event(s): Other (Comment) (Pt would not respond) Persecutory voices/beliefs?: Yes Depression: Yes Depression Symptoms: Insomnia, Isolating Substance abuse history and/or treatment for substance abuse?:  No Suicide prevention information given to non-admitted patients: Not applicable  Risk to Others within the past 6 months Homicidal Ideation: No Does patient have any lifetime risk of violence toward others beyond the six months prior to admission? : No Thoughts of Harm to Others: No Current Homicidal Intent: No Current Homicidal Plan: No Access to Homicidal Means: No Identified Victim: None History of harm to others?: No Assessment of Violence: None Noted Violent Behavior Description: No known history of violence Does patient have access to weapons?: No Criminal Charges Pending?: No Does patient have a court date: No Is patient on probation?: No  Psychosis Hallucinations: None noted Delusions: Grandiose, Persecutory  Mental Status Report Appearance/Hygiene: In scrubs Eye Contact: Poor Motor Activity: Unremarkable Speech: Soft, Slow Level of Consciousness: Alert Mood: Anxious Affect: Blunted Anxiety Level: Moderate Thought Processes: Circumstantial Judgement: Impaired Orientation: Person, Place, Time, Situation Obsessive Compulsive Thoughts/Behaviors: None  Cognitive Functioning Concentration: Decreased Memory: Unable to Assess Is patient IDD: No Insight: Poor Impulse Control: Unable to Assess Appetite: Fair Have you had any weight changes? : No Change Sleep: Decreased Total Hours of Sleep:  (Unknown) Vegetative Symptoms: None  ADLScreening [facility] Assessment Services) Patient's cognitive ability adequate to safely complete daily activities?: Yes Patient able to express need for assistance with ADLs?: Yes Independently performs ADLs?: Yes (appropriate for developmental age)  Prior Inpatient Therapy Prior Inpatient Therapy: Yes Prior Therapy Dates: 10/2018, multiple admits Prior  Therapy Facilty/Provider(s): [facility], [place], [place] Reason for Treatment: Schizoaffective disorder  Prior Outpatient Therapy Prior Outpatient Therapy: Yes Prior Therapy Dates:  2020 Prior Therapy Facilty/Provider(s): [facility] Reason for Treatment: Schizoaffective disorder Does patient have an ACCT team?: No Does patient have Intensive In-House Services?  : No Does patient have [facility] services? : No Does patient have P4CC services?: No  ADL Screening (condition at time of admission) Patient's cognitive ability adequate to safely complete daily activities?: Yes Is the patient deaf or have difficulty hearing?: No Does the patient have difficulty seeing, even when wearing glasses/contacts?: No Does the patient have difficulty concentrating, remembering, or making decisions?: No Patient able to express need for assistance with ADLs?: Yes Does the patient have difficulty dressing or bathing?: No Independently performs ADLs?: Yes (appropriate for developmental age) Does the patient have difficulty walking or climbing stairs?: No Weakness of Legs: None Weakness of Arms/Hands: None  Home Assistive Devices/Equipment Home Assistive Devices/Equipment: Crutches    Abuse/Neglect Assessment (Assessment to be complete while patient is alone) Abuse/Neglect Assessment Can Be Completed: Yes Physical Abuse: Denies Verbal Abuse: Denies Sexual Abuse: Denies Exploitation of patient/patient's resources: Denies Self-Neglect: Denies     Merchant navy officer (For Healthcare) Does Patient Have a Medical Advance Directive?: No Would patient like information on creating a medical advance directive?: No - Patient declined          Disposition: Gave clinical report to Gillermo Murdoch, NP who said Pt meets criteria for inpatient psychiatric treatment. Binnie Rail, [facility], confirmed bed availability. Pt is accepted to the service of Dr. Jola Babinski, room 507-1, pending COVID test. Notified Dr. Clayborne Dana and Jory Sims, RN of acceptance.  Disposition Initial Assessment Completed for this Encounter: Yes  This service was provided via telemedicine using a 2-way,  interactive audio and video technology.  Names of all persons participating in this telemedicine service and their role in this encounter. Name: Barbara George Role: Patient  Name: Amadou Adamou Role: Pt's husband  Name: Shela Commons, [facility] Role: TTS counselor      Harlin Rain Patsy Baltimore, [facility], [facility] Triage Specialist [phone]  Pamalee Leyden 02/20/2020 11:19 PM

## 2020-02-20 NOTE — ED Triage Notes (Signed)
Pt to ED from urgent care.  Pt standing with eyes closed, unable to hear what pt is saying d/t voice low.   Denies taking any medications. Denies pain.

## 2020-02-20 NOTE — ED Provider Notes (Signed)
[facility] EMERGENCY DEPARTMENT Provider Note   CSN: [id number] Arrival date & time: 02/20/20  1613     History No chief complaint on file.   Barbara George is a 24 y.o. female with a past medical history of schizophrenia, bipolar disorder, depression presenting to ED for possible psychiatric evaluation. The only information I can get from the patient through a [place] interpreter is that "I have been following my dreams, I had a dream about God."  There is no family member at the bedside.  HPI     Past Medical History:  Diagnosis Date   Bipolar 1 disorder (HCC)    Depression    Diabetes mellitus without complication (HCC)    Type 2 (Pre)   Schizophrenia (HCC)     Patient Active Problem List   Diagnosis Date Noted   Schizoaffective disorder, bipolar type (HCC) 10/23/2018   Bipolar I disorder, current or most recent episode manic, with psychotic features (HCC) 08/08/2016    History reviewed. No pertinent surgical history.   OB History    Gravida  0   Para  0   Term  0   Preterm  0   AB  0   Living  0     SAB  0   TAB  0   Ectopic  0   Multiple  0   Live Births  0           Family History  Problem Relation Age of Onset   Hypertension Father     Social History   Tobacco Use   Smoking status: Never Smoker   Smokeless tobacco: Never Used  Vaping Use   Vaping Use: Never used  Substance Use Topics   Alcohol use: No    Alcohol/week: 0.0 standard drinks   Drug use: No    Home Medications Prior to Admission medications   Medication Sig Start Date End Date Taking? Authorizing Provider  docusate sodium (COLACE) 100 MG capsule Take 100 mg by mouth 2 (two) times daily as needed for mild constipation.    [provider]  medroxyPROGESTERone (PROVERA) 10 MG tablet Take 1 tablet (10 mg total) by mouth daily. 08/24/19   Leftwich-Kirby, Wilmer Floor, CNM  megestrol (MEGACE) 40 MG tablet Take 1 tablet (40 mg  total) by mouth daily. Patient not taking: Reported on 07/29/2019 07/15/19   Wallis Bamberg, PA-C  metroNIDAZOLE (FLAGYL) 500 MG tablet Take 1 tablet (500 mg total) by mouth 2 (two) times daily. Patient not taking: Reported on 08/24/2019 08/03/19   Constant, Peggy, MD  OLANZapine (ZYPREXA) 10 MG tablet Take 10 mg by mouth at bedtime.    [provider]    Allergies    Peanut-containing drug products and Pork-derived products  Review of Systems   Review of Systems  Unable to perform ROS: Psychiatric disorder    Physical Exam Updated Vital Signs BP 129/82    Pulse 92    Temp 98.7 F (37.1 C) (Oral)    Resp 14    LMP 02/08/2020    SpO2 99%   Physical Exam Vitals and nursing note reviewed.  Constitutional:      General: She is not in acute distress.    Appearance: She is well-developed.  HENT:     Head: Normocephalic and atraumatic.     Nose: Nose normal.  Eyes:     General: No scleral icterus.       Right eye: No discharge.  Left eye: No discharge.     Conjunctiva/sclera: Conjunctivae normal.  Cardiovascular:     Rate and Rhythm: Normal rate and regular rhythm.     Heart sounds: Normal heart sounds. No murmur heard.  No friction rub. No gallop.   Pulmonary:     Effort: Pulmonary effort is normal. No respiratory distress.     Breath sounds: Normal breath sounds.  Abdominal:     General: Bowel sounds are normal. There is no distension.     Palpations: Abdomen is soft.     Tenderness: There is no abdominal tenderness. There is no guarding.  Musculoskeletal:        General: Normal range of motion.     Cervical back: Normal range of motion and neck supple.  Skin:    General: Skin is warm and dry.     Findings: No rash.  Neurological:     Mental Status: She is alert.     Motor: No abnormal muscle tone.     Coordination: Coordination normal.  Psychiatric:        Mood and Affect: Affect is flat.        Thought Content: Thought content does not include homicidal or  suicidal ideation.     Comments: Speaking in a low voice. Unwilling to answer questions.      ED Results / Procedures / Treatments   Labs (all labs ordered are listed, but only abnormal results are displayed) Labs Reviewed  CBC WITH DIFFERENTIAL/PLATELET - Abnormal; Notable for the following components:      Result Value   Hemoglobin 11.7 (*)    MCH 25.9 (*)    All other components within normal limits  ACETAMINOPHEN LEVEL - Abnormal; Notable for the following components:   Acetaminophen (Tylenol), Serum <10 (*)    All other components within normal limits  SALICYLATE LEVEL - Abnormal; Notable for the following components:   Salicylate Lvl <7.0 (*)    All other components within normal limits  COMPREHENSIVE METABOLIC PANEL  ETHANOL  RAPID URINE DRUG SCREEN, HOSP PERFORMED  I-STAT BETA HCG BLOOD, ED ([facility], [facility], [facility] ONLY)  CBG MONITORING, ED    EKG None  Radiology No results found.  Procedures Procedures (including critical care time)  Medications Ordered in ED Medications - No data to display  ED Course  I have reviewed the triage vital signs and the nursing notes.  Pertinent labs & imaging results that were available during my care of the patient were reviewed by me and considered in my medical decision making (see chart for details).  Clinical Course as of Feb 20 2039  Sat Feb 20, 2020  1820 Additional history provided by husband at the bedside.  States that he brought her to the ER because she was complaining of abdominal pain yesterday.  She last saw her psychiatrist on September 2 due to insomnia.  States that "he put her on a medicine to help her sleep but she still acting the same."  He is trying to get her to give more information but patient still unwilling to talk. Husband is also vague about if this is her baseline, although he states "there's been something wrong in her brain, but that's how she is."   [HK]  1821 Patient denying SI, HI.   [HK]    Clinical  Course User Index [HK] Dietrich Pates, PA-C   MDM Rules/Calculators/A&P  24 year old female presenting to the ED for possible psychiatric evaluation.  She has a history of bipolar disorder, schizophrenia and depression.  She initially did not give much of a history.  Husband states that she last saw her psychiatrist on September 2 due to insomnia.  She was put on a new medication.  In regards to her being nonverbal, has been is vague about if this is baseline for her or how long this has been going on.  She is denying any SI, HI.  She told me that she is having dreams about God.  Has been states that she complained of abdominal pain yesterday.  Medical screening lab work including CBC, CMP, hCG, acetaminophen and Tylenol level are unremarkable.  Her abdomen is soft, nontender nondistended today.  She will need TTS evaluation for ultimate disposition.  At this time she is here voluntarily. There is no indication for IVC at this time.   Portions of this note were generated with Scientist, clinical (histocompatibility and immunogenetics). Dictation errors may occur despite best attempts at proofreading.  Final Clinical Impression(s) / ED Diagnoses Final diagnoses:  Delusions [facility])    Rx / DC Orders ED Discharge Orders    None       Dietrich Pates, PA-C 02/20/20 2042    Margarita Grizzle, MD 02/23/20 [phone]

## 2020-02-20 NOTE — ED Notes (Signed)
Attempted to triage patient in triage. Pt refusing to speak at this time. Pt looking at RN, but will not speak. Kim RN also to room to assist with triage. Pt refusing to speak initially, but then mumbled a few words and refusing to speak again. Patient informed we are trying to help, but unable to do so if she will not cooperate. Pt continues to refuse to answer questions.

## 2020-02-20 NOTE — ED Notes (Signed)
Patient is being discharged from the Urgent Care and sent to the Emergency Department via private vehicle . Per Moshe Cipro, np, patient is in need of higher level of care due to presentation and medication usage and psych component. Patient is aware and verbalizes understanding of plan of care.  Vitals:   02/20/20 1553  BP: 130/72  Pulse: 100  Resp: (!) 24  Temp: 98.6 F (37 C)  SpO2: 100%

## 2020-02-20 NOTE — ED Notes (Signed)
Husband just handed a [facility] appt card from 02/04/2020

## 2020-02-21 ENCOUNTER — Inpatient Hospital Stay (HOSPITAL_COMMUNITY)
Admission: AD | Admit: 2020-02-21 | Discharge: 2020-02-24 | DRG: 885 | Disposition: A | Discharge status: Home / Self Care | Payer: Medicaid Other | Attending: Psychiatry | Admitting: Psychiatry

## 2020-02-21 ENCOUNTER — Encounter (HOSPITAL_COMMUNITY): Payer: Self-pay | Admitting: Psychiatry

## 2020-02-21 DIAGNOSIS — F22 Delusional disorders: Secondary | ICD-10-CM | POA: Diagnosis not present

## 2020-02-21 DIAGNOSIS — E119 Type 2 diabetes mellitus without complications: Secondary | ICD-10-CM | POA: Diagnosis not present

## 2020-02-21 DIAGNOSIS — Z91018 Allergy to other foods: Secondary | ICD-10-CM

## 2020-02-21 DIAGNOSIS — N926 Irregular menstruation, unspecified: Secondary | ICD-10-CM | POA: Diagnosis not present

## 2020-02-21 DIAGNOSIS — F940 Selective mutism: Secondary | ICD-10-CM | POA: Diagnosis not present

## 2020-02-21 DIAGNOSIS — F29 Unspecified psychosis not due to a substance or known physiological condition: Secondary | ICD-10-CM | POA: Diagnosis present

## 2020-02-21 DIAGNOSIS — Z91010 Allergy to peanuts: Secondary | ICD-10-CM

## 2020-02-21 DIAGNOSIS — G4700 Insomnia, unspecified: Secondary | ICD-10-CM | POA: Diagnosis not present

## 2020-02-21 DIAGNOSIS — Z8249 Family history of ischemic heart disease and other diseases of the circulatory system: Secondary | ICD-10-CM | POA: Diagnosis not present

## 2020-02-21 DIAGNOSIS — F250 Schizoaffective disorder, bipolar type: Secondary | ICD-10-CM | POA: Diagnosis not present

## 2020-02-21 DIAGNOSIS — Z20822 Contact with and (suspected) exposure to covid-19: Secondary | ICD-10-CM | POA: Diagnosis present

## 2020-02-21 LAB — SARS CORONAVIRUS 2 BY RT PCR (HOSPITAL ORDER, PERFORMED IN ~~LOC~~ HOSPITAL LAB): SARS Coronavirus 2: NEGATIVE

## 2020-02-21 LAB — GLUCOSE, CAPILLARY: Glucose-Capillary: 92 mg/dL (ref 70–99)

## 2020-02-21 MED ORDER — ACETAMINOPHEN 325 MG PO TABS
650.0000 mg | ORAL_TABLET | Freq: Four times a day (QID) | ORAL | Status: DC | PRN
  Administered 2020-02-24: 650 mg via ORAL
  Filled 2020-02-21: qty 2

## 2020-02-21 MED ORDER — ALUM & MAG HYDROXIDE-SIMETH 200-200-20 MG/5ML PO SUSP: 30.0000 mL | ORAL | Status: DC | PRN

## 2020-02-21 MED ORDER — OLANZAPINE 10 MG PO TABS
10.0000 mg | ORAL_TABLET | Freq: Every day | ORAL | Status: DC
  Administered 2020-02-21 – 2020-02-23 (×3): 10 mg via ORAL
  Filled 2020-02-21 (×4): qty 1

## 2020-02-21 MED ORDER — MAGNESIUM HYDROXIDE 400 MG/5ML PO SUSP: 30.0000 mL | Freq: Every day | ORAL | Status: DC | PRN

## 2020-02-21 MED ORDER — TRAZODONE HCL 50 MG PO TABS
50.0000 mg | ORAL_TABLET | Freq: Every evening | ORAL | Status: DC | PRN
  Administered 2020-02-21: 50 mg via ORAL
  Filled 2020-02-21: qty 1

## 2020-02-21 NOTE — [facility] Suicide Risk Assessment (Signed)
[facility] Admission Suicide Risk Assessment   Nursing information obtained from:  Patient Demographic factors:  NA Current Mental Status:  NA Loss Factors:  NA Historical Factors:  Impulsivity Risk Reduction Factors:  NA  Total Time spent with patient: 30 minutes Principal Problem: <principal problem not specified> Diagnosis:  Active Problems:   Psychosis (HCC)  Subjective Data: Patient is seen and examined.  Patient is a 24 year old female with a past psychiatric history significant for schizoaffective disorder who presented to the [facility] urgent care center on 02/20/2020.  At that time the patient answered questions, but was very quiet and would sit with her eyes closed.  The husband apparently [facility] from 02/04/2020.  She was sent to the [facility] emergency department to rule out any medical causes.  The patient continued to be selectively mute.  The only thing that they were able to obtain in the emergency department was a "that stated "I have been following my dreams, I had a dream about God."  She was assessed by the telemetry assessment service and additional information collected revealed that the patient had been brought secondary to insomnia and a change in mental status.  She again would not provide a great deal of information.  The patient acknowledged at that time that she had not been sleeping.  She apparently had been last seen at [facility] on September 2 due to insomnia.  The husband stated at that time that "they put her on a medicine to help her sleep, but she is still acting the same".  On examination today she is selectively mute.  She is tearful.  She did state that she has a relationship with God.  Review of the electronic medical record revealed that the patient had been admitted to our facility in May 2020.  She was noted at that time to be disorganized and delusional.  She was concerned about voodoo and was hyper  religious.  According to the electronic medical record at that time she had previously been treated with Abilify p.o. as well as the Abilify maintain injection.  On her admission on 5/21 she was started on Zyprexa 5 mg.  She was basically held on an observation basis, and then transferred to [facility] for inpatient psychiatric treatment.  She had a follow-up gynecological examination on 04/03/2019.  List of medications at that time included the long-acting Abilify injection.  There was also a hospitalization in 2018 at [facility].  She was diagnosed with schizoaffective disorder at that time.  There is also concern for underlying bipolar disorder.  She was discharged on temazepam 30 mg p.o. nightly, topiramate 25 mg p.o. twice daily, Abilify 10 mg p.o. daily and the long-acting Abilify injection 400 mg q. 4 weeks.  She was also prescribed Depakote extended release 250 mg in the morning, and 500 mg p.o. nightly.  She was also prescribed Zyprexa 20 mg p.o. nightly and famotidine.  In an obstetrical note from July 02, 2019 with regard to her abnormal uterine bleeding.  That the patient had reported her irregular menses started about the time that she had started the long-acting Abilify injections.  It looks like after that she was on the olanzapine 10 mg p.o. nightly alone.  A more recent visit to the gynecologist revealed olanzapine 10 mg p.o. nightly as the only psychiatric medication at that time.  She was admitted to the hospital for evaluation and stabilization.  Continued Clinical Symptoms:  Alcohol Use Disorder Identification Test Final Score (AUDIT): 0 The "Alcohol Use Disorders Identification Test", Guidelines for Use in Primary Care, Second Edition.  World Science writer [facility]). Score between 0-7:  no or low risk or alcohol related problems. Score between 8-15:  moderate risk of alcohol related problems. Score between 16-19:  high risk of alcohol related problems. Score 20 or above:   warrants further diagnostic evaluation for alcohol dependence and treatment.   CLINICAL FACTORS:   Bipolar Disorder:   Depressive phase Schizophrenia:   Depressive state Less than 22 years old Paranoid or undifferentiated type   Musculoskeletal: Strength & Muscle Tone: within normal limits Gait & Station: normal Patient leans: N/A  Psychiatric Specialty Exam: Physical Exam Vitals and nursing note reviewed.  HENT:     Head: Normocephalic and atraumatic.  Pulmonary:     Effort: Pulmonary effort is normal.  Neurological:     General: No focal deficit present.     Mental Status: She is alert.     Review of Systems  Blood pressure 108/80, pulse 91, temperature 98.2 F (36.8 C), temperature source Oral, resp. rate 16, height 5\' 8"  (1.727 m), weight 95.3 kg, last menstrual period 02/08/2020, SpO2 98 %.Body mass index is 31.93 kg/m.  General Appearance: Bizarre  Eye Contact:  Minimal  Speech:  Selectively mute  Volume:  Decreased  Mood:  Dysphoric  Affect:  Constricted and Tearful  Thought Process:  Goal Directed and Descriptions of Associations: Circumstantial  Orientation:  Negative  Thought Content:  Delusions and Paranoid Ideation  Suicidal Thoughts:  No  Homicidal Thoughts:  No  Memory:  Immediate;   Poor Recent;   Poor Remote;   Poor  Judgement:  Impaired  Insight:  Lacking  Psychomotor Activity:  Decreased  Concentration:  Concentration: Fair and Attention Span: Fair  Recall:  Poor  Fund of Knowledge:  Poor  Language:  Fair  Akathisia:  Negative  Handed:  Right  AIMS (if indicated):     Assets:  Desire for Improvement Housing Resilience  ADL's:  Impaired  Cognition:  WNL  Sleep:         COGNITIVE FEATURES THAT CONTRIBUTE TO RISK:  Thought constriction (tunnel vision)    SUICIDE RISK:   Moderate:  Frequent suicidal ideation with limited intensity, and duration, some specificity in terms of plans, no associated intent, good self-control, limited  dysphoria/symptomatology, some risk factors present, and identifiable protective factors, including available and accessible social support.  PLAN OF CARE: Patient is seen and examined.  Patient is a 24 year old female with a probable past psychiatric history significant for schizoaffective disorder; bipolar type versus bipolar disorder, most recently mixed with psychotic features.  She will be admitted to the hospital.  She will be integrated in the milieu.  She will be encouraged to attend groups.  On admission she was placed on Zyprexa 10 mg p.o. nightly.  We will go on and increase this to 20 mg p.o. nightly and I will give her 10 mg daily as well.  We will try and get in touch with [facility] tomorrow to see if after she had been discharged from [facility] whether or not the Depakote was effective in stabilizing her mood.  Additionally of try had and increased her olanzapine from 10 mg to any other dose and it was ineffective it may require [facility] to change medications.  We will monitor how she does on the olanzapine 10 mg p.o. daily and 20 mg p.o. nightly for now.  We  will contact the husband for collateral information in case he is aware of other medicine she may have been treated with in the past and done well with.  Review of her laboratories showed normal electrolytes including liver function enzymes.  Her CBC was essentially normal except for a slightly low MCH.  Differential was normal.  Her acetaminophen was less than 10, salicylate less than 7.  Beta-hCG was negative.  Blood alcohol was less than 10.  Drug screen was completely negative.  Her vital signs are stable and she is afebrile.  I certify that inpatient services furnished can reasonably be expected to improve the patient's condition.   Antonieta Pert, MD 02/21/2020, 11:42 AM

## 2020-02-21 NOTE — [facility] Group Notes (Signed)
Psychoeducational Group Note  Date:  02-21-20 Time:  1300  Group Topic/Focus:  Making Healthy Choices:   The focus of this group is to help patients identify negative/unhealthy choices they were using prior to admission and identify positive/healthier coping strategies to replace them upon discharge.  Participation Level:  Did not attend   Maliya Marich A  

## 2020-02-21 NOTE — [facility] Group Notes (Signed)
LCSW Group Therapy 02/21/20 11:00 AM   Type of Therapy and Topic:  Group Therapy:  Setting Goals   Participation Level:  None   Description of Group: In this process group, patients discussed using strengths to work toward goals and address challenges.  Patients identified two positive things about themselves and one goal they were working on.  Patients were given the opportunity to share openly and support each other's plan for self-empowerment.  The group discussed the value of gratitude and were encouraged to have a daily reflection of positive characteristics or circumstances.  Patients were encouraged to identify a plan to utilize their strengths to work on current challenges and goals.   Therapeutic Goals 1. Patient will verbalize personal strengths/positive qualities and relate how these can assist with achieving desired personal goals 2. Patients will verbalize affirmation of peers plans for personal change and goal setting 3. Patients will explore the value of gratitude and positive focus as related to successful achievement of goals 4. Patients will verbalize a plan for regular reinforcement of personal positive qualities and circumstances.   Summary of Patient Progress: Patient did not speak in group.     Therapeutic Modalities Cognitive Behavioral Therapy Motivational Interviewing

## 2020-02-21 NOTE — ED Notes (Signed)
[facility] called for transport.  ?

## 2020-02-21 NOTE — Progress Notes (Signed)
D: Patient appears with flat affect and is sometimes selectively mute. Patient speaks in low soft voice. Patient is mainly cooperative and pleasant. Patient denies any SI/HI at this time. Patient aware to notify staff or RN if this changes. Patient observed in dayroom participating in group.  A: Provided positive reinforcement and encouragement.  R: Patient cooperative and receptive to efforts.  [place] NOVEL CORONAVIRUS (COVID-19) DAILY CHECK-OFF SYMPTOMS - answer yes or no to each - every day NO YES  Have you had a fever in the past 24 hours?   Fever (Temp > 37.80C / 100F) X    Have you had any of these symptoms in the past 24 hours?  New Cough   Sore Throat    Shortness of Breath   Difficulty Breathing   Unexplained Body Aches   X    Have you had any one of these symptoms in the past 24 hours not related to allergies?    Runny Nose   Nasal Congestion   Sneezing   X    If you have had runny nose, nasal congestion, sneezing in the past 24 hours, has it worsened?   X    EXPOSURES - check yes or no X    Have you traveled outside the state in the past 14 days?   X    Have you been in contact with someone with a confirmed diagnosis of COVID-19 or PUI in the past 14 days without wearing appropriate PPE?   X    Have you been living in the same home as a person with confirmed diagnosis of COVID-19 or a PUI (household contact)?     X    Have you been diagnosed with COVID-19?     X                                                                                                                             What to do next: Answered NO to all: Answered YES to anything:    Proceed with unit schedule Follow the [facility] Inpatient Flowsheet.

## 2020-02-21 NOTE — H&P (Signed)
Psychiatric Admission Assessment Adult  Patient Identification: Treniya Lobb MRN:  [id number] Date of Evaluation:  02/21/2020 Chief Complaint:  Psychosis [facility]) [F29] Principal Diagnosis: <principal problem not specified> Diagnosis:  Active Problems:   Psychosis (HCC)  History of Present Illness: Patient is seen and examined.  Patient is a 24 year old female with a past psychiatric history significant for schizoaffective disorder who presented to the [facility] urgent care center on 02/20/2020.  At that time the patient answered questions, but was very quiet and would sit with her eyes closed.  The [facility] apparently [facility] from [facility]/04/2020.  She was sent to the [facility] emergency department to rule out any medical causes.  The patient continued to be selectively mute.  The only thing that they were able to obtain in the emergency department was a "that stated "I have been following my dreams, I had a dream about God."  She was assessed by the telemetry assessment service and additional information collected revealed that the patient had been brought secondary to insomnia and a change in mental status.  She again would not provide a great deal of information.  The patient acknowledged at that time that she had not been sleeping.  She apparently had been last [facility] at [facility] on September 2 due to insomnia.  The husband stated at that time that "they put her on a medicine to help her sleep, but she is still acting the same".  On examination today she is selectively mute.  She is tearful.  She did state that she has a relationship with God.  Review of the electronic medical record revealed that the patient had been admitted to our facility in May 2020.  She was noted at that time to be disorganized and delusional.  She was concerned about voodoo and was hyper religious.  According to the electronic medical record at that time she  had previously been treated with Abilify p.o. as well as the Abilify maintain injection.  On her admission on 5/21 she was started on Zyprexa 5 mg.  She was basically held on an observation basis, and then transferred to [facility] for inpatient psychiatric treatment.  She had a follow-up gynecological examination on 04/03/2019.  List of medications at that time included the long-acting Abilify injection.  There was also a hospitalization in 2018 at [facility].  She was diagnosed with schizoaffective disorder at that time.  There is also concern for underlying bipolar disorder.  She was discharged on temazepam 30 mg p.o. nightly, topiramate 25 mg p.o. twice daily, Abilify 10 mg p.o. daily and the long-acting Abilify injection 400 mg q. 4 weeks.  She was also prescribed Depakote extended release 250 mg in the morning, and 500 mg p.o. nightly.  She was also prescribed Zyprexa 20 mg p.o. nightly and famotidine.  In an obstetrical note from July 02, 2019 with regard to her abnormal uterine bleeding.  That the patient had reported her irregular menses started about the time that she had started the long-acting Abilify injections.  It looks like after that she was on the olanzapine 10 mg p.o. nightly alone.  A more recent visit to the gynecologist revealed olanzapine 10 mg p.o. nightly as the only psychiatric medication at that time.  She was admitted to the hospital for evaluation and stabilization.  Associated Signs/Symptoms: Depression Symptoms:  depressed mood, insomnia, psychomotor retardation, loss of energy/fatigue, disturbed sleep, Duration of Depression Symptoms: No data  recorded (Hypo) Manic Symptoms:  Delusions, Hallucinations, Impulsivity, Labiality of Mood, Anxiety Symptoms:  Excessive Worry, Psychotic Symptoms:  Delusions, Paranoia, Duration of Psychotic Symptoms: No data recorded PTSD Symptoms: Negative Total Time spent with patient: 1 hour  Past Psychiatric History: It  appears as though she is only had  psychiatric admissions in the past.  That appears to be on 09/08/2016, and then was transferred to [facility] from the [facility] emergency department on or around 10/24/2018.  From review of the chart the patient has been previously treated with oral Abilify, Abilify maintainer, temazepam, Zyprexa, Depakote, Cogentin, and Topamax.  It appears from review of the chart that the Abilify long-acting injection was stopped sometime this year secondary to concern for irregular uterine bleeding.  Is the patient at risk to self? Yes.    Has the patient been a risk to self in the past 6 months? No.  Has the patient been a risk to self within the distant past? Yes.    Is the patient a risk to others? No.  Has the patient been a risk to others in the past 6 months? No.  Has the patient been a risk to others within the distant past? No.   Prior Inpatient Therapy:   Prior Outpatient Therapy:    Alcohol Screening: 1. How often do you have a drink containing alcohol?: Never 2. How many drinks containing alcohol do you have on a typical day when you are drinking?: 1 or 2 3. How often do you have six or more drinks on one occasion?: Never AUDIT-C Score: 0 4. How often during the last year have you found that you were not able to stop drinking once you had started?: Never 5. How often during the last year have you failed to do what was normally expected from you because of drinking?: Never 6. How often during the last year have you needed a first drink in the morning to get yourself going after a heavy drinking session?: Never 7. How often during the last year have you had a feeling of guilt of remorse after drinking?: Never 8. How often during the last year have you been unable to remember what happened the night before because you had been drinking?: Never 9. Have you or someone else been injured as a result of your drinking?: No 10. Has a relative or friend or a doctor  or another health worker been concerned about your drinking or suggested you cut down?: No Alcohol Use Disorder Identification Test Final Score (AUDIT): 0 Substance Abuse History in the last 12 months:  No. Consequences of Substance Abuse: Negative Previous Psychotropic Medications: Yes  Psychological Evaluations: Yes  Past Medical History:  Past Medical History:  Diagnosis Date  . Bipolar 1 disorder (HCC)   . Depression   . 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 Schizophrenia (HCC)    History reviewed. No pertinent surgical history. Family History:  Family History  Problem Relation Age of Onset  . Hypertension Father    Family Psychiatric  History: Patient unable to respond at this point. Tobacco Screening:   Social History:  Social History   Substance and Sexual Activity  Alcohol Use No  . Alcohol/week: 0.0 standard drinks     Social History   Substance and Sexual Activity  Drug Use No    Additional Social History:  Allergies:   Allergies  Allergen Reactions  . Peanut-Containing Drug Products Nausea And Vomiting  . Pork-Derived Products Other (See Comments) and Rash    Religious purposes Religious purposes   Lab Results:  Results for orders placed or performed during the hospital encounter of 02/20/20 (from the past 48 hour(s))  POC CBG, ED     Status: None   Collection Time: 02/20/20  6:16 PM  Result Value Ref Range   Glucose-Capillary 79 70 - 99 mg/dL    Comment: Glucose reference range applies only to samples taken after fasting for at least 8 hours.  Comprehensive metabolic panel     Status: None   Collection Time: 02/20/20  6:28 PM  Result Value Ref Range   Sodium 139 135 - 145 mmol/L   Potassium 3.7 3.5 - 5.1 mmol/L   Chloride 104 98 - 111 mmol/L   CO2 26 22 - 32 mmol/L   Glucose, Bld 87 70 - 99 mg/dL    Comment: Glucose reference range applies only to samples taken after fasting for at  least 8 hours.   BUN 9 6 - 20 mg/dL   Creatinine, Ser 1.60 0.44 - 1.00 mg/dL   Calcium 9.2 8.9 - 10.9 mg/dL   Total Protein 7.3 6.5 - 8.1 g/dL   Albumin 3.8 3.5 - 5.0 g/dL   AST 16 15 - 41 U/L   ALT 15 0 - 44 U/L   Alkaline Phosphatase 50 38 - 126 U/L   Total Bilirubin 0.7 0.3 - 1.2 mg/dL   GFR calc non Af Amer >60 >60 mL/min   GFR calc Af Amer >60 >60 mL/min   Anion gap 9 5 - 15    Comment: Performed at [facility], [address], [place], [place] [address]  CBC with Differential     Status: Abnormal   Collection Time: 02/20/20  6:28 PM  Result Value Ref Range   WBC 7.3 4.0 - 10.5 K/uL   RBC 4.52 3.87 - 5.11 MIL/uL   Hemoglobin 11.7 (L) 12.0 - 15.0 g/dL   HCT 73.2 36 - 46 %   MCV 85.4 80.0 - 100.0 fL   MCH 25.9 (L) 26.0 - 34.0 pg   MCHC 30.3 30.0 - 36.0 g/dL   RDW 20.2 54.2 - 70.6 %   Platelets 299 150 - 400 K/uL   nRBC 0.0 0.0 - 0.2 %   Neutrophils Relative % 58 %   Neutro Abs 4.2 1.7 - 7.7 K/uL   Lymphocytes Relative 33 %   Lymphs Abs 2.4 0.7 - 4.0 K/uL   Monocytes Relative 7 %   Monocytes Absolute 0.5 0 - 1 K/uL   Eosinophils Relative 1 %   Eosinophils Absolute 0.1 0 - 0 K/uL   Basophils Relative 1 %   Basophils Absolute 0.1 0 - 0 K/uL   WBC Morphology See Note     Comment: Vaculated Neutrophils   Immature Granulocytes 0 %   Abs Immature Granulocytes 0.02 0.00 - 0.07 K/uL    Comment: Performed at [facility], [address] [place], [place] [address]  Acetaminophen level     Status: Abnormal   Collection Time: 02/20/20  6:28 PM  Result Value Ref Range   Acetaminophen (Tylenol), Serum <10 (L) 10 - 30 ug/mL    Comment: (NOTE) Therapeutic concentrations vary significantly. A range of 10-30 ug/mL  may be an effective concentration for many patients. However, some  are best treated at concentrations outside of  this range. Acetaminophen concentrations >150 ug/mL at 4 hours after ingestion  and >50 ug/mL at 12 hours after ingestion are often associated  with  toxic reactions.  Performed at [facility], [address], [place], [place] [address]   Salicylate level     Status: Abnormal   Collection Time: 02/20/20  6:28 PM  Result Value Ref Range   Salicylate Lvl <7.0 (L) 7.0 - 30.0 mg/dL    Comment: Performed at [facility], [address], [place], [place] [address]  Ethanol     Status: None   Collection Time: 02/20/20  6:28 PM  Result Value Ref Range   Alcohol, Ethyl (B) <10 <10 mg/dL    Comment: (NOTE) Lowest detectable limit for serum alcohol is 10 mg/dL.  For medical purposes only. Performed at [facility], [address], [place], [place] [address]   I-Stat beta hCG blood, ED     Status: None   Collection Time: 02/20/20  6:29 PM  Result Value Ref Range   I-stat hCG, quantitative <5.0 <5 mIU/mL   Comment 3            Comment:   GEST. AGE      CONC.  (mIU/mL)   <=1 WEEK        5 - 50     2 WEEKS       50 - 500     3 WEEKS       100 - 10,000     4 WEEKS     1,000 - 30,000        FEMALE AND NON-PREGNANT FEMALE:     LESS THAN 5 mIU/mL   Urine rapid drug screen (hosp performed)     Status: None   Collection Time: 02/20/20  8:24 PM  Result Value Ref Range   Opiates NONE DETECTED NONE DETECTED   Cocaine NONE DETECTED NONE DETECTED   Benzodiazepines NONE DETECTED NONE DETECTED   Amphetamines NONE DETECTED NONE DETECTED   Tetrahydrocannabinol NONE DETECTED NONE DETECTED   Barbiturates NONE DETECTED NONE DETECTED    Comment: (NOTE) DRUG SCREEN FOR MEDICAL PURPOSES ONLY.  IF CONFIRMATION IS NEEDED FOR ANY PURPOSE, NOTIFY LAB WITHIN 5 DAYS.  LOWEST DETECTABLE LIMITS FOR URINE DRUG SCREEN Drug Class                     Cutoff (ng/mL) Amphetamine and metabolites    1000 Barbiturate and metabolites    200 Benzodiazepine                 200 Tricyclics and metabolites     300 Opiates and metabolites        300 Cocaine and metabolites        300 THC                            50 Performed at [facility], [address] [place], [place] [address]   SARS Coronavirus 2 by RT PCR (hospital order, performed in [facility] hospital lab) Nasopharyngeal Nasopharyngeal Swab     Status: None   Collection Time: 02/21/20 12:10 AM   Specimen: Nasopharyngeal Swab  Result Value Ref Range   SARS Coronavirus 2 NEGATIVE NEGATIVE    Comment: (NOTE) SARS-CoV-2 target nucleic acids are NOT DETECTED.  The SARS-CoV-2 RNA is generally detectable in upper and lower respiratory specimens during the acute phase of  infection. The lowest concentration of SARS-CoV-2 viral copies this assay can detect is 250 copies / mL. A negative result does not preclude SARS-CoV-2 infection and should not be used as the sole basis for treatment or other patient management decisions.  A negative result may occur with improper specimen collection / handling, submission of specimen other than nasopharyngeal swab, presence of viral mutation(s) within the areas targeted by this assay, and inadequate number of viral copies (<250 copies / mL). A negative result must be combined with clinical observations, patient history, and epidemiological information.  Fact Sheet for Patients:   BoilerBrush.com.cy  Fact Sheet for Healthcare Providers: https://pope.com/  This test is not yet approved or  cleared by the [place] FDA and has been authorized for detection and/or diagnosis of SARS-CoV-2 by FDA under an Emergency Use Authorization (EUA).  This EUA will remain in effect (meaning this test can be used) for the duration of the COVID-19 declaration under Section 564(b)(1) of the Act, 21 U.S.C. section 360bbb-3(b)(1), unless the authorization is terminated or revoked sooner.  Performed at [facility], [address], [place], [place] [address]     Blood Alcohol level:  Lab Results  Component Value Date   ETH <10 02/20/2020   ETH <10 03/22/2018     Metabolic Disorder Labs:  Lab Results  Component Value Date   HGBA1C 5.5 10/23/2018   MPG 111.15 10/23/2018   MPG 105 08/09/2016   No results found for: PROLACTIN Lab Results  Component Value Date   CHOL 176 10/23/2018   TRIG 50 10/23/2018   HDL 71 10/23/2018   CHOLHDL 2.5 10/23/2018   VLDL 10 10/23/2018   LDLCALC 95 10/23/2018   LDLCALC 71 08/09/2016    Current Medications: Current Facility-Administered Medications  Medication Dose Route Frequency Provider Last Rate Last Admin  . acetaminophen (TYLENOL) tablet 650 mg  650 mg Oral Q6H PRN Antonieta Pert, MD      . alum & mag hydroxide-simeth (MAALOX/MYLANTA) 200-200-20 MG/5ML suspension 30 mL  30 mL Oral Q4H PRN Antonieta Pert, MD      . magnesium hydroxide (MILK OF MAGNESIA) suspension 30 mL  30 mL Oral Daily PRN Antonieta Pert, MD      . OLANZapine [facility]) tablet 10 mg  10 mg Oral QHS Antonieta Pert, MD      . traZODone (DESYREL) tablet 50 mg  50 mg Oral QHS PRN Antonieta Pert, MD       PTA Medications: Medications Prior to Admission  Medication Sig Dispense Refill Last Dose  . OLANZapine (ZYPREXA) 10 MG tablet Take 10 mg by mouth at bedtime. (Patient not taking: Reported on 02/21/2020)   Not Taking at Unknown time    Musculoskeletal: Strength & Muscle Tone: within normal limits Gait & Station: normal Patient leans: N/A  Psychiatric Specialty Exam: Physical Exam Vitals and nursing note reviewed.  HENT:     Head: Normocephalic and atraumatic.  Pulmonary:     Effort: Pulmonary effort is normal.  Neurological:     General: No focal deficit present.     Mental Status: She is alert.     Review of Systems  Blood pressure 108/80, pulse 91, temperature 98.2 F (36.8 C), temperature source Oral, resp. rate 16, height 5\' 8"  (1.727 m), weight 95.3 kg, last menstrual period 02/08/2020, SpO2 98 %.Body mass index is 31.93 kg/m.  General Appearance: Bizarre  Eye Contact:  Minimal  Speech:   Blocked  Volume:  Decreased  Mood:  Depressed and Dysphoric  Affect:  Constricted and Tearful  Thought Process:  Goal Directed and Descriptions of Associations: Loose  Orientation:  Negative  Thought Content:  Delusions and Paranoid Ideation  Suicidal Thoughts:  No  Homicidal Thoughts:  No  Memory:  Immediate;   Poor Recent;   Poor Remote;   Poor  Judgement:  Impaired  Insight:  Lacking  Psychomotor Activity:  Decreased  Concentration:  Concentration: Fair  Recall:  Poor  Fund of Knowledge:  Poor  Language:  Fair  Akathisia:  Negative  Handed:  Right  AIMS (if indicated):     Assets:  Desire for Improvement Resilience  ADL's:  Impaired  Cognition:  WNL  Sleep:       Treatment Plan Summary: Daily contact with patient to assess and evaluate symptoms and progress in treatment, Medication management and Plan : Patient is seen and examined.  Patient is a 24 year old female with a probable past psychiatric history significant for schizoaffective disorder; bipolar type versus bipolar disorder, most recently mixed with psychotic features.  She will be admitted to the hospital.  She will be integrated in the milieu.  She will be encouraged to attend groups.  On admission she was placed on Zyprexa 10 mg p.o. nightly.  We will go on and increase this to 20 mg p.o. nightly and I will give her 10 mg daily as well.  We will try and get in touch with [facility] tomorrow to see if after she had been discharged from [facility] whether or not the Depakote was effective in stabilizing her mood.  Additionally of try had and increased her olanzapine from 10 mg to any other dose and it was ineffective it may require [place] to change medications.  We will monitor how she does on the olanzapine 10 mg p.o. daily and 20 mg p.o. nightly for now.  We will contact the husband for collateral information in case he is aware of other medicine she may have been treated with in the past and done well with.  Review of her  laboratories showed normal electrolytes including liver function enzymes.  Her CBC was essentially normal except for a slightly low MCH.  Differential was normal.  Her acetaminophen was less than 10, salicylate less than 7.  Beta-hCG was negative.  Blood alcohol was less than 10.  Drug screen was completely negative.  Her vital signs are stable and she is afebrile.  Observation Level/Precautions:  15 minute checks  Laboratory:  Chemistry Profile  Psychotherapy:    Medications:    Consultations:    Discharge Concerns:    Estimated LOS:  Other:     Physician Treatment Plan for Primary Diagnosis: <principal problem not specified> Long Term Goal(s): Improvement in symptoms so as ready for discharge  Short Term Goals: Ability to identify changes in lifestyle to reduce recurrence of condition will improve, Ability to verbalize feelings will improve, Ability to demonstrate self-control will improve, Ability to identify and develop effective coping behaviors will improve, Ability to maintain clinical measurements within normal limits will improve and Compliance with prescribed medications will improve  Physician Treatment Plan for Secondary Diagnosis: Active Problems:   Psychosis (HCC)  Long Term Goal(s): Improvement in symptoms so as ready for discharge  Short Term Goals: Ability to identify changes in lifestyle to reduce recurrence of condition will improve, Ability to verbalize feelings will improve, Ability to demonstrate self-control will improve, Ability to identify and develop effective coping behaviors will improve, Ability to maintain  clinical measurements within normal limits will improve and Compliance with prescribed medications will improve  I certify that inpatient services furnished can reasonably be expected to improve the patient's condition.    Antonieta Pert, MD 9/19/20212:16 PM

## 2020-02-21 NOTE — Progress Notes (Signed)
Patient ID: Barbara George, female   DOB: [birth year], 24 y.o.   MRN: [id number] Patient is a 24 year old African American female admitted under voluntary status from [facility].  Patient taken there for change in mental status and insomnia by her husband. During admission, patient not is delusional and  Grandiose, states "I am a Doctor" when asked who she is, the stated: "I just want to come get some Robots. Pt responded " I am the Boss, I do not hurt myself", when asked if she was suicidal, and stated: "A voice does not hear a voice when asked about auditory hallucinations. Patient denies physical, emotional and sexual abuse, and is very intrusive, incoherent and with flight of ideas. Q5 minute checks in place for safety.

## 2020-02-21 NOTE — ED Notes (Signed)
Husband sent home with all of pts belongings including phone and clothes and purse.

## 2020-02-21 NOTE — [facility] Group Notes (Signed)
Adult Psychoeducational Group Not Date:  02/21/2020 Time:  1000-1045 Group Topic/Focus: PROGRESSIVE RELAXATION. A group where deep breathing is taught and tensing and relaxation muscle groups is used. Imagery is used as well.  Pts are asked to imagine 3 pillars that hold them up when they are not able to hold themselves up.  Participation Level:  Active  Participation Quality:  Appropriate  Affect:  Appropriate  Cognitive:  Oriented  Insight: Improving  Engagement in Group:  Engaged  Modes of Intervention:  Activity, Discussion, Education, and Support  Additional Comments:  Pt was in the room, but did not partake of ex cerise. States I am very shy and I don't really want to talk  Dione Housekeeper 02/21/2020

## 2020-02-22 NOTE — Progress Notes (Signed)
[facility] MD Progress Note  02/22/2020 11:58 AM Barbara George  MRN:  [id number] Subjective: Patient is a 24 year old female with a past psychiatric history significant for schizoaffective disorder who presented to the [facility] urgent care center on 02/20/2020.  She was selectively mute, and apparently had become psychotic.  She apparently had previously been on Zyprexa with long-acting Abilify.  The long-acting Abilify had to be stopped secondary to uterine bleeding issues.  Objective: Patient is seen and examined.  Patient is a 24 year old female with the above-stated past psychiatric history who is seen in follow-up.  She is sleeping this morning, and still sedated.  She did sleep 6 hours last night.  Review of the nursing notes from last night revealed that she was basically unchanged from my examination yesterday afternoon.  At least this morning she is not tearful, agitated.  She is able to answer some questions but is sedated.  She received a total of 20 mg of Zyprexa yesterday.  Reportedly she had been compliant with her medications, but I suspect with her reaction to the 10 mg of Zyprexa last night she may not have been taking her medications.  Her vital signs are stable, she is afebrile.  As stated above she slept 6 hours last night.  Review of laboratories revealed no new laboratories from  Admission.  There is still not a TSH in the chart.  EKG from 9/19 showed a normal sinus rhythm with a normal QTc interval.  Principal Problem: <principal problem not specified> Diagnosis: Active Problems:   Psychosis (HCC)  Total Time spent with patient: 20 minutes  Past Psychiatric History: See admission H&P  Past Medical History:  Past Medical History:  Diagnosis Date   Bipolar 1 disorder (HCC)    Depression    Diabetes mellitus without complication (HCC)    Type 2 (Pre)   Schizophrenia (HCC)    History reviewed. No pertinent surgical history. Family History:  Family History   Problem Relation Age of Onset   Hypertension Father    Family Psychiatric  History: See admission H&P Social History:  Social History   Substance and Sexual Activity  Alcohol Use No   Alcohol/week: 0.0 standard drinks     Social History   Substance and Sexual Activity  Drug Use No    Social History   Socioeconomic History   Marital status: Married    Spouse name: Amadou   Number of children: Not on file   Years of education: Not on file   Highest education level: Associate degree: occupational, Scientist, product/process development, or vocational program  Occupational History   Occupation: unemployed  Tobacco Use   Smoking status: Never Smoker   Smokeless tobacco: Never Used  Building services engineer Use: Never used  Substance and Sexual Activity   Alcohol use: No    Alcohol/week: 0.0 standard drinks   Drug use: No   Sexual activity: Yes    Birth control/protection: None  Other Topics Concern   Not on file  Social History Narrative   ** Merged History Encounter **       Social Determinants of Health   Financial Resource Strain:    Difficulty of Paying Living Expenses: Not on file  Food Insecurity: No Food Insecurity   Worried About Programme researcher, broadcasting/film/video in the Last Year: Never true   Ran Out of Food in the Last Year: Never true  Transportation Needs: No Transportation Needs   Lack of Transportation (Medical): No   Lack of  Transportation (Non-Medical): No  Physical Activity: Inactive   Days of Exercise per Week: 0 days   Minutes of Exercise per Session: 0 min  Stress:    Feeling of Stress : Not on file  Social Connections: Socially Integrated   Frequency of Communication with Friends and Family: More than three times a week   Frequency of Social Gatherings with Friends and Family: Once a week   Attends Religious Services: More than 4 times per year   Active Member of [employer] or Organizations: Yes   Attends Engineer, structural: Not on file   Marital  Status: Married   Additional Social History:                         Sleep: Good  Appetite:  Fair  Current Medications: Current Facility-Administered Medications  Medication Dose Route Frequency Provider Last Rate Last Admin   acetaminophen (TYLENOL) tablet 650 mg  650 mg Oral Q6H PRN Antonieta Pert, MD       alum & mag hydroxide-simeth (MAALOX/MYLANTA) 200-200-20 MG/5ML suspension 30 mL  30 mL Oral Q4H PRN Antonieta Pert, MD       magnesium hydroxide (MILK OF MAGNESIA) suspension 30 mL  30 mL Oral Daily PRN Antonieta Pert, MD       OLANZapine (ZYPREXA) tablet 10 mg  10 mg Oral QHS Antonieta Pert, MD   10 mg at 02/21/20 2111   traZODone (DESYREL) tablet 50 mg  50 mg Oral QHS PRN Antonieta Pert, MD   50 mg at 02/21/20 2111    Lab Results:  Results for orders placed or performed during the hospital encounter of 02/21/20 (from the past 48 hour(s))  Glucose, capillary     Status: None   Collection Time: 02/21/20  9:02 PM  Result Value Ref Range   Glucose-Capillary 92 70 - 99 mg/dL    Comment: Glucose reference range applies only to samples taken after fasting for at least 8 hours.    Blood Alcohol level:  Lab Results  Component Value Date   ETH <10 02/20/2020   ETH <10 03/22/2018    Metabolic Disorder Labs: Lab Results  Component Value Date   HGBA1C 5.5 10/23/2018   MPG 111.15 10/23/2018   MPG 105 08/09/2016   No results found for: PROLACTIN Lab Results  Component Value Date   CHOL 176 10/23/2018   TRIG 50 10/23/2018   HDL 71 10/23/2018   CHOLHDL 2.5 10/23/2018   VLDL 10 10/23/2018   LDLCALC 95 10/23/2018   LDLCALC 71 08/09/2016    Physical Findings: AIMS:  , ,  ,  ,    CIWA:    COWS:     Musculoskeletal: Strength & Muscle Tone: decreased Gait & Station: normal Patient leans: N/A  Psychiatric Specialty Exam: Physical Exam Vitals and nursing note reviewed.  HENT:     Head: Normocephalic and atraumatic.  Pulmonary:      Effort: Pulmonary effort is normal.  Neurological:     General: No focal deficit present.     Mental Status: She is alert.     Review of Systems  Blood pressure 108/80, pulse 91, temperature 98.2 F (36.8 C), temperature source Oral, resp. rate 16, height 5\' 8"  (1.727 m), weight 95.3 kg, last menstrual period 02/08/2020, SpO2 98 %.Body mass index is 31.93 kg/m.  General Appearance: Disheveled  Eye Contact:  Minimal  Speech:  Slow  Volume:  Decreased  Mood:  Sedated  Affect:  Flat  Thought Process:  Goal Directed and Descriptions of Associations: Circumstantial  Orientation:  Negative  Thought Content:  Negative  Suicidal Thoughts:  No  Homicidal Thoughts:  No  Memory:  Immediate;   Poor Recent;   Poor Remote;   Poor  Judgement:  Intact  Insight:  Fair  Psychomotor Activity:  Decreased  Concentration:  Concentration: Poor and Attention Span: Poor  Recall:  Poor  Fund of Knowledge:  Poor  Language:  Fair  Akathisia:  Negative  Handed:  Right  AIMS (if indicated):     Assets:  Desire for Improvement Resilience  ADL's:  Intact  Cognition:  WNL  Sleep:  Number of Hours: 6     Treatment Plan Summary: Daily contact with patient to assess and evaluate symptoms and progress in treatment, Medication management and Plan : Patient is seen and examined.  Patient is a 24 year old female with the above-stated past psychiatric history who is seen in follow-up.   Diagnosis: 1.  Schizoaffective disorder; bipolar type  Pertinent findings on examination today: 1.  Patient slept 6 hours last night and is sleeping right now.  It looks like she only got Zyprexa 10 mg last night for total dose of 20 mg.  We will monitor to see how she wakes up today and assess her symptoms.  Plan: 1.  Continue Zyprexa 10 mg p.o. nightly for psychosis and mood stability. 2.  Order TSH 3.  Collateral information from husband 4.  Disposition planning-in progress.  Antonieta Pert, MD 02/22/2020, 11:58  AM

## 2020-02-22 NOTE — Progress Notes (Signed)
   02/21/20 2150  Psych Admission Type (Psych Patients Only)  Admission Status Voluntary  Psychosocial Assessment  Patient Complaints Suspiciousness  Eye Contact Brief  Facial Expression Blank;Flat  Affect Blunted;Preoccupied  Speech Soft  Interaction Isolative  Motor Activity Slow  Appearance/Hygiene In hospital gown  Behavior Characteristics Guarded  Mood Preoccupied;Suspicious  Thought Process  Coherency Circumstantial  Content Preoccupation  Delusions None reported or observed  Hallucination None reported or observed  Judgment Poor  Confusion Mild

## 2020-02-22 NOTE — Tx Team (Signed)
Interdisciplinary Treatment and Diagnostic Plan Update  02/22/2020 Time of Session: 10:20am Barbara George MRN: [id number]  Principal Diagnosis: <principal problem not specified>  Secondary Diagnoses: Active Problems:   Psychosis ([place])   Current Medications:  Current Facility-Administered Medications  Medication Dose Route Frequency Provider Last Rate Last Admin  . acetaminophen (TYLENOL) tablet 650 mg  650 mg Oral Q6H PRN Sharma Covert, MD      . alum & mag hydroxide-simeth (MAALOX/MYLANTA) 200-200-20 MG/5ML suspension 30 mL  30 mL Oral Q4H PRN Sharma Covert, MD      . magnesium hydroxide (MILK OF MAGNESIA) suspension 30 mL  30 mL Oral Daily PRN Sharma Covert, MD      . OLANZapine [facility]) tablet 10 mg  10 mg Oral QHS Sharma Covert, MD   10 mg at 02/21/20 2111  . traZODone (DESYREL) tablet 50 mg  50 mg Oral QHS PRN Sharma Covert, MD   50 mg at 02/21/20 2111   PTA Medications: Medications Prior to Admission  Medication Sig Dispense Refill Last Dose  . OLANZapine (ZYPREXA) 10 MG tablet Take 10 mg by mouth at bedtime. (Patient not taking: Reported on 02/21/2020)   Not Taking at Unknown time    Patient Stressors:    Patient Strengths:    Treatment Modalities: Medication Management, Group therapy, Case management,  1 to 1 session with clinician, Psychoeducation, Recreational therapy.   Physician Treatment Plan for Primary Diagnosis: <principal problem not specified> Long Term Goal(s): Improvement in symptoms so as ready for discharge Improvement in symptoms so as ready for discharge   Short Term Goals: Ability to identify changes in lifestyle to reduce recurrence of condition will improve Ability to verbalize feelings will improve Ability to demonstrate self-control will improve Ability to identify and develop effective coping behaviors will improve Ability to maintain clinical measurements within normal limits will improve Compliance with  prescribed medications will improve Ability to identify changes in lifestyle to reduce recurrence of condition will improve Ability to verbalize feelings will improve Ability to demonstrate self-control will improve Ability to identify and develop effective coping behaviors will improve Ability to maintain clinical measurements within normal limits will improve Compliance with prescribed medications will improve  Medication Management: Evaluate patient's response, side effects, and tolerance of medication regimen.  Therapeutic Interventions: 1 to 1 sessions, Unit Group sessions and Medication administration.  Evaluation of Outcomes: Not Met  Physician Treatment Plan for Secondary Diagnosis: Active Problems:   Psychosis ([place])  Long Term Goal(s): Improvement in symptoms so as ready for discharge Improvement in symptoms so as ready for discharge   Short Term Goals: Ability to identify changes in lifestyle to reduce recurrence of condition will improve Ability to verbalize feelings will improve Ability to demonstrate self-control will improve Ability to identify and develop effective coping behaviors will improve Ability to maintain clinical measurements within normal limits will improve Compliance with prescribed medications will improve Ability to identify changes in lifestyle to reduce recurrence of condition will improve Ability to verbalize feelings will improve Ability to demonstrate self-control will improve Ability to identify and develop effective coping behaviors will improve Ability to maintain clinical measurements within normal limits will improve Compliance with prescribed medications will improve     Medication Management: Evaluate patient's response, side effects, and tolerance of medication regimen.  Therapeutic Interventions: 1 to 1 sessions, Unit Group sessions and Medication administration.  Evaluation of Outcomes: Not Met   RN Treatment Plan for Primary  Diagnosis: <principal problem not  specified> Long Term Goal(s): Knowledge of disease and therapeutic regimen to maintain health will improve  Short Term Goals: Ability to remain free from injury will improve, Ability to participate in decision making will improve, Ability to verbalize feelings will improve, Ability to identify and develop effective coping behaviors will improve and Compliance with prescribed medications will improve  Medication Management: RN will administer medications as ordered by provider, will assess and evaluate patient's response and provide education to patient for prescribed medication. RN will report any adverse and/or side effects to prescribing provider.  Therapeutic Interventions: 1 on 1 counseling sessions, Psychoeducation, Medication administration, Evaluate responses to treatment, Monitor vital signs and CBGs as ordered, Perform/monitor CIWA, COWS, AIMS and Fall Risk screenings as ordered, Perform wound care treatments as ordered.  Evaluation of Outcomes: Not Met   LCSW Treatment Plan for Primary Diagnosis: <principal problem not specified> Long Term Goal(s): Safe transition to appropriate next level of care at discharge, Engage patient in therapeutic group addressing interpersonal concerns.  Short Term Goals: Engage patient in aftercare planning with referrals and resources, Increase social support, Increase ability to appropriately verbalize feelings, Facilitate acceptance of mental health diagnosis and concerns and Increase skills for wellness and recovery  Therapeutic Interventions: Assess for all discharge needs, 1 to 1 time with Social worker, Explore available resources and support systems, Assess for adequacy in community support network, Educate family and significant other(s) on suicide prevention, Complete Psychosocial Assessment, Interpersonal group therapy.  Evaluation of Outcomes: Not Met   Progress in Treatment: Attending groups:  No. Participating in groups: No. Taking medication as prescribed: Yes. Toleration medication: Yes. Family/Significant other contact made: No, will contact:  if consent is given Patient understands diagnosis: No. Discussing patient identified problems/goals with staff: No. Medical problems stabilized or resolved: Yes. Denies suicidal/homicidal ideation: Yes. Issues/concerns per patient self-inventory: No.   New problem(s) identified: Yes, Describe:  Patient is selectively mute  New Short Term/Long Term Goal(s): medication stabilization, elimination of SI thoughts, development of comprehensive mental wellness plan.   Patient Goals:  Did not attend  Discharge Plan or Barriers: Patient recently admitted. CSW will continue to follow and assess for appropriate referrals and possible discharge planning.   Reason for Continuation of Hospitalization: Delusions  Hallucinations Medication stabilization Other; describe Selective mutism  Estimated Length of Stay: 3-5 days  Attendees: Patient: Did not attend  02/22/2020   Physician:  02/22/2020   Nursing:  02/22/2020   RN Care Manager: 02/22/2020  Social Worker: Darletta Moll, LCSW 02/22/2020   Recreational Therapist:  02/22/2020   Other:  02/22/2020   Other:  02/22/2020   Other: 02/22/2020       Scribe for Treatment Team: Vassie Moselle, LCSW 02/22/2020 2:22 PM

## 2020-02-22 NOTE — Progress Notes (Signed)
   02/21/20 2150  COVID-19 Daily Checkoff  Have you had a fever (temp > 37.80C/100F)  in the past 24 hours?  No  If you have had runny nose, nasal congestion, sneezing in the past 24 hours, has it worsened? No  COVID-19 EXPOSURE  Have you traveled outside the state in the past 14 days? No  Have you been in contact with someone with a confirmed diagnosis of COVID-19 or PUI in the past 14 days without wearing appropriate PPE? No  Have you been living in the same home as a person with confirmed diagnosis of COVID-19 or a PUI (household contact)? No  Have you been diagnosed with COVID-19? No   

## 2020-02-22 NOTE — Progress Notes (Signed)
Recreation Therapy Notes  Date: 9.20.21 Time: 1015 Location: 500 Hall Dayroom  Group Topic: Triggers  Goal Area(s) Addresses:  Patient will identify their biggest triggers. Patient will identify ways to avoid dealing with triggers. Patient will identify ways to face triggers head on.  Intervention:  Worksheet, pencils   Activity: Triggers.  Patients were to describe their three biggest triggers.  Patients were to then identify ways to avoid/reduce exposure to triggers.  Patients would then identify ways in which they can face triggers head on when they are unavoidable.   Education: Triggers, Discharge Planning  Education Outcome: Acknowledges understanding/In group clarification offered/Needs additional education.   Clinical Observations/Feedback: Pt did not attend group session.    Caroll Rancher, LRT/CTRS        Caroll Rancher A 02/22/2020 11:35 AM

## 2020-02-22 NOTE — BHH Group Notes (Signed)
The focus of this group is to help patients establish daily goals to achieve during treatment and discuss how the patient can incorporate goal setting into their daily lives to aide in recovery.  Pt did not attend group 

## 2020-02-22 NOTE — BHH Counselor (Signed)
Adult Comprehensive Assessment  Patient ID: Barbara George, female   DOB: 1995-10-31, 24 y.o.   MRN: [id number]  Information Source: Information source: Patient  Current Stressors:  Patient states their primary concerns and needs for treatment are:: "Just getting milk" Patient giggles often during assessment and becomes selectively mute throughout. Patient states their goals for this hospitilization and ongoing recovery are:: Counsellor / Learning stressors: Denies stressor Employment / Job issues: States she does not work Family Relationships: Denies Metallurgist / Lack of resources (include bankruptcy): Denies stressor Housing / Lack of housing: Denies stressor Physical health (include injuries & life threatening diseases): Denies stressor Social relationships: Denies stressor Substance abuse: UTA Bereavement / Loss: Denies stressor  Living/Environment/Situation:  Living Arrangements: Spouse/significant other Living conditions (as described by patient or guardian): UTA Who else lives in the home?: Husband How long has patient lived in current situation?: UTA  Family History:  Marital status: Married What types of issues is patient dealing with in the relationship?: Denies any issues Additional relationship information: UTA What is your sexual orientation?: Heterosexual Has your sexual activity been affected by drugs, alcohol, medication, or emotional stress?: N/A Does patient have children?: No  Childhood History:  By whom was/is the patient raised?: Both parents, Grandparents Additional childhood history information: Grew up in [place] Description of patient's relationship with caregiver when they were a child: Sometimes good and sometimes bad with parents  Patient's description of current relationship with people who raised him/her: "Really good" How were you disciplined when you got in trouble as a child/adolescent?: States she was well behaved Does  patient have siblings?: Yes Number of Siblings: 2 Description of patient's current relationship with siblings: UTA Did patient suffer any verbal/emotional/physical/sexual abuse as a child?: Yes Did patient suffer from severe childhood neglect?: No Has patient ever been sexually abused/assaulted/raped as an adolescent or adult?: No Witnessed domestic violence?: No Has patient been affected by domestic violence as an adult?: No  Education:  Highest grade of school patient has completed: UTA Currently a Consulting civil engineer?: No Learning disability?: No  Employment/Work Situation:   Employment situation: Unemployed Patient's job has been impacted by current illness: No What is the longest time patient has a held a job?: Never worked  Where was the patient employed at that time?: Never worked  Has patient ever been in the [employer]?: No  Financial Resources:   Surveyor, quantity resources: Income from spouse Does patient have a representative payee or guardian?: No  Alcohol/Substance Abuse:   What has been your use of drugs/alcohol within the last 12 months?: Patient denies all substance use If attempted suicide, did drugs/alcohol play a role in this?: No Alcohol/Substance Abuse Treatment Hx: Denies past history Has alcohol/substance abuse ever caused legal problems?: No  Social Support System:   Patient's Community Support System: Good Describe Community Support System: Family Type of faith/religion: "Believe in god" How does patient's faith help to cope with current illness?: "Faith"  Leisure/Recreation:   Do You Have Hobbies?: Yes Leisure and Hobbies: Playing baasketball  Strengths/Needs:   What is the patient's perception of their strengths?: "Playing basketball" Patient states they can use these personal strengths during their treatment to contribute to their recovery: UTA Patient states these barriers may affect/interfere with their treatment: Patient has been selectively mute upon arriving to  the hospital. Patient states these barriers may affect their return to the community: UTA Other important information patient would like considered in planning for their treatment: None  Discharge Plan:   Currently  receiving community mental health services: No Patient states concerns and preferences for aftercare planning are: Patient stated she was interested in continuing medications and receiving services in the community Patient states they will know when they are safe and ready for discharge when: UTA Does patient have access to transportation?: Yes Does patient have financial barriers related to discharge medications?: No Patient description of barriers related to discharge medications: n/a Will patient be returning to same living situation after discharge?: Yes  Summary/Recommendations:   Summary and Recommendations (to be completed by the evaluator): Patient is a 24 year old female with a past psychiatric history significant for schizoaffective disorder who presented to the [facility] urgent care center on 02/20/2020.  She was selectively mute, and apparently had become psychotic.  She apparently had previously been on Zyprexa with long-acting Abilify.  The long-acting Abilify had to be stopped secondary to uterine bleeding issues. While here,  Barbara George can benefit from crisis stabilization, medication management, therapeutic milieu, and referrals for services.  Barbara George A Barbara George. 02/22/2020

## 2020-02-22 NOTE — Progress Notes (Signed)
°   02/22/20 1300  Psych Admission Type (Psych Patients Only)  Admission Status Voluntary  Psychosocial Assessment  Patient Complaints Suspiciousness  Eye Contact Brief  Facial Expression Blank;Flat  Affect Blunted;Preoccupied  Speech Soft  Interaction Isolative  Motor Activity Slow  Appearance/Hygiene In hospital gown  Behavior Characteristics Guarded  Mood Preoccupied  Thought Process  Coherency Circumstantial  Content Preoccupation  Delusions None reported or observed  Hallucination None reported or observed  Judgment Poor  Confusion Mild  Danger to Self  Current suicidal ideation? Denies  Danger to Others  Danger to Others None reported or observed  Dar Note: Patient presents with flat affect and depressed mood.  Denies suicidal thoughts, auditory and visual hallucinations.   Patient visible in milieu with minimal interaction with staff and peers.  Routine safety checks maintained every 15 minutes.  Patient is safe on and off the unit.

## 2020-02-23 LAB — TSH: TSH: 0.478 u[IU]/mL (ref 0.350–4.500)

## 2020-02-23 NOTE — Progress Notes (Signed)
   02/23/20 1100  Psych Admission Type (Psych Patients Only)  Admission Status Voluntary  Psychosocial Assessment  Patient Complaints Suspiciousness  Eye Contact Brief  Facial Expression Blank;Flat  Affect Blunted;Preoccupied  Speech Soft  Interaction Isolative  Motor Activity Slow  Appearance/Hygiene In hospital gown  Behavior Characteristics Guarded  Mood Preoccupied  Thought Process  Coherency Circumstantial  Content Preoccupation  Delusions None reported or observed  Hallucination None reported or observed  Judgment Poor  Confusion Mild  Danger to Self  Current suicidal ideation? Denies  Danger to Others  Danger to Others None reported or observed

## 2020-02-23 NOTE — Progress Notes (Signed)
[facility] MD Progress Note  02/23/2020 12:46 PM Barbara George  MRN:  [id number] Subjective:  Patient is a 24 year old female with a past psychiatric history significant for schizoaffective disorder who presented to the [facility] urgent care center on 02/20/2020.  She was selectively mute, and apparently had become psychotic.  She apparently had previously been on Zyprexa with long-acting Abilify.  The long-acting Abilify had to be stopped secondary to uterine bleeding issues.  Objective: Patient is seen and examined.  Patient is a 24 year old female with the above-stated past psychiatric history who is seen in follow-up.  She is better today but still delusional.  She stated that God and the angels would answer most of my questions.  We discussed whether or not she had been compliant with her oral medications, and she stated God was the only one who could answer that.  Shortly after I got finished examining the patient her father called, and stated she had been ill for 7 years.  He stated that they watch her closely, and she does not need that medicine.  He stated that she was sent to the emergency room because of abdominal pain, and he does not understand how she ended up in the psychiatric hospital.  He stated he had been given her the oral medicine, but he did not see or take it, so he is unclear on whether or not she was compliant.  He stated that the doctor who prescribes that medicine is Faustino Congress, who is a Publishing rights manager in the obstetrical gynecology office.  I told him I thought she needed to see a psychiatrist.  He stated she had been ill for many years, and they kept an eye on her, and they would take care of these things.  Her vital signs are stable, she is afebrile.  She was mildly tachycardic this morning.  She slept over 6 hours last night.  We still do not have a TSH on her.  A year ago her TSH was 0.278.  Principal Problem: <principal problem not specified> Diagnosis: Active  Problems:   Psychosis (HCC)  Total Time spent with patient: 20 minutes  Past Psychiatric History: See admission H&P  Past Medical History:  Past Medical History:  Diagnosis Date  . Bipolar 1 disorder (HCC)   . Depression   . 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 Schizophrenia (HCC)    History reviewed. No pertinent surgical history. Family History:  Family History  Problem Relation Age of Onset  . Hypertension Father    Family Psychiatric  History: See admission H&P Social History:  Social History   Substance and Sexual Activity  Alcohol Use No  . Alcohol/week: 0.0 standard drinks     Social History   Substance and Sexual Activity  Drug Use No    Social History   Socioeconomic History  . Marital status: Married    Spouse name: Amadou  . Number of children: Not on file  . Years of education: Not on file  . Highest education level: Associate degree: occupational, Scientist, product/process development, or vocational program  Occupational History  . Occupation: unemployed  Tobacco Use  . Smoking status: Never Smoker  . Smokeless tobacco: Never Used  Vaping Use  . Vaping Use: Never used  Substance and Sexual Activity  . Alcohol use: No    Alcohol/week: 0.0 standard drinks  . Drug use: No  . Sexual activity: Yes    Birth control/protection: None  Other Topics Concern  .  Not on file  Social History Narrative   ** Merged History Encounter **       Social Determinants of Health   Financial Resource Strain:   . Difficulty of Paying Living Expenses: Not on file  Food Insecurity: No Food Insecurity  . Worried About Programme researcher, broadcasting/film/video in the Last Year: Never true  . Ran Out of Food in the Last Year: Never true  Transportation Needs: No Transportation Needs  . Lack of Transportation (Medical): No  . Lack of Transportation (Non-Medical): No  Physical Activity: Inactive  . Days of Exercise per Week: 0 days  . Minutes of Exercise per Session: 0 min  Stress:    . Feeling of Stress : Not on file  Social Connections: Socially Integrated  . Frequency of Communication with Friends and Family: More than three times a week  . Frequency of Social Gatherings with Friends and Family: Once a week  . Attends Religious Services: More than 4 times per year  . Active Member of Clubs or Organizations: Yes  . Attends Banker Meetings: Not on file  . Marital Status: Married   Additional Social History:                         Sleep: Good  Appetite:  Good  Current Medications: Current Facility-Administered Medications  Medication Dose Route Frequency Provider Last Rate Last Admin  . acetaminophen (TYLENOL) tablet 650 mg  650 mg Oral Q6H PRN Antonieta Pert, MD      . alum & mag hydroxide-simeth (MAALOX/MYLANTA) 200-200-20 MG/5ML suspension 30 mL  30 mL Oral Q4H PRN Antonieta Pert, MD      . magnesium hydroxide (MILK OF MAGNESIA) suspension 30 mL  30 mL Oral Daily PRN Antonieta Pert, MD      . OLANZapine [facility]) tablet 10 mg  10 mg Oral QHS Antonieta Pert, MD   10 mg at 02/22/20 2129  . traZODone (DESYREL) tablet 50 mg  50 mg Oral QHS PRN Antonieta Pert, MD   50 mg at 02/21/20 2111    Lab Results:  Results for orders placed or performed during the hospital encounter of 02/21/20 (from the past 48 hour(s))  Glucose, capillary     Status: None   Collection Time: 02/21/20  9:02 PM  Result Value Ref Range   Glucose-Capillary 92 70 - 99 mg/dL    Comment: Glucose reference range applies only to samples taken after fasting for at least 8 hours.    Blood Alcohol level:  Lab Results  Component Value Date   ETH <10 02/20/2020   ETH <10 03/22/2018    Metabolic Disorder Labs: Lab Results  Component Value Date   HGBA1C 5.5 10/23/2018   MPG 111.15 10/23/2018   MPG 105 08/09/2016   No results found for: PROLACTIN Lab Results  Component Value Date   CHOL 176 10/23/2018   TRIG 50 10/23/2018   HDL 71 10/23/2018    CHOLHDL 2.5 10/23/2018   VLDL 10 10/23/2018   LDLCALC 95 10/23/2018   LDLCALC 71 08/09/2016    Physical Findings: AIMS:  , ,  ,  ,    CIWA:    COWS:     Musculoskeletal: Strength & Muscle Tone: within normal limits Gait & Station: normal Patient leans: N/A  Psychiatric Specialty Exam: Physical Exam Vitals and nursing note reviewed.  HENT:     Head: Normocephalic and atraumatic.  Pulmonary:  Effort: Pulmonary effort is normal.  Neurological:     General: No focal deficit present.     Mental Status: She is alert.     Review of Systems  Blood pressure 122/60, pulse (!) 135, temperature 97.8 F (36.6 C), temperature source Oral, resp. rate 16, height 5\' 8"  (1.727 m), weight 95.3 kg, last menstrual period 02/08/2020, SpO2 98 %.Body mass index is 31.93 kg/m.  General Appearance: Bizarre  Eye Contact:  Minimal  Speech:  Normal Rate  Volume:  Decreased  Mood:  Euthymic  Affect:  Congruent  Thought Process:  Goal Directed and Descriptions of Associations: Loose  Orientation:  Full (Time, Place, and Person)  Thought Content:  Delusions and Hallucinations: Auditory  Suicidal Thoughts:  No  Homicidal Thoughts:  No  Memory:  Immediate;   Poor Recent;   Poor Remote;   Poor  Judgement:  Impaired  Insight:  Lacking  Psychomotor Activity:  Normal  Concentration:  Concentration: Fair and Attention Span: Fair  Recall:  Poor  Fund of Knowledge:  Fair  Language:  Fair  Akathisia:  Negative  Handed:  Right  AIMS (if indicated):     Assets:  Desire for Improvement Resilience  ADL's:  Intact  Cognition:  WNL  Sleep:  Number of Hours: 6.75     Treatment Plan Summary: Daily contact with patient to assess and evaluate symptoms and progress in treatment, Medication management and Plan : Patient is seen and examined.  Patient is a 24 year old female with the above-stated past psychiatric history who is seen in follow-up.  Diagnosis: 1.  Schizoaffective disorder; bipolar  type versus schizophrenia 2.  History of abnormal thyroid function studies  Pertinent findings on examination today: 1.  Patient is now verbal, but clearly still delusional.  She is having hyper religious delusions, and probably auditory hallucinations. 2.  Still no TSH in the chart. 3.  Sleep has improved.  Plan: 1.  Continue Zyprexa 10 mg p.o. nightly for psychosis and mood stability.  I suspect after discharge that we will have difficult time ensuring compliance.  Her father on the phone today clearly let me know that he does not believe she needs the medications.  They will most likely stop it.  I told him today I thought she needed the long-acting injectable antipsychotics of some shape or form, and he has told me that not to do that. 2.  Reorder TSH. 3.  Disposition planning-in progress. 25, MD 02/23/2020, 12:46 PM

## 2020-02-23 NOTE — Progress Notes (Signed)
Patient has been isolative to self this shift.  Patient has been compliant with medications as has had no complaints.    Assess patient for safety, offer medications as prescribed, engage patient in 1:1 staff talks.  Continue to monitor as planned. Patient able to maintain safety.

## 2020-02-24 MED ORDER — OLANZAPINE 15 MG PO TABS: 15.0000 mg | ORAL_TABLET | Freq: Every day | ORAL | 0 refills | Status: DC

## 2020-02-24 MED ORDER — TRAZODONE HCL 50 MG PO TABS
50.0000 mg | ORAL_TABLET | Freq: Every evening | ORAL | 0 refills | Status: DC | PRN
Start: 2020-02-24 — End: 2020-03-25

## 2020-02-24 MED ORDER — OLANZAPINE 7.5 MG PO TABS
15.0000 mg | ORAL_TABLET | Freq: Every day | ORAL | Status: DC
  Filled 2020-02-24: qty 2

## 2020-02-24 NOTE — Progress Notes (Signed)
  [facility] Adult Case Management Discharge Plan :  Will you be returning to the same living situation after discharge:  Yes,  to return to stay with husband  At discharge, do you have transportation home?: Yes,  father to pick this patient up Do you have the ability to pay for your medications: Yes,  has insurance   Release of information consent forms completed and in the chart;  Patient's signature needed at discharge.  Patient to Follow up at:  Follow-up Information    [facility]. Go on 03/07/2020.   Specialty: Behavioral Health Why: You have a walk in appointment for therapy on 03/07/20 at 3:30 pm.  You also have an appointment for medication management on 03/25/20 at 10:10 am.  These appointments will be held in person.  Contact information: [phone] [place] [address] [phone]              Next level of care provider has access to [facility]:yes  Safety Planning and Suicide Prevention discussed: Yes,  with father     Has patient been referred to the Quitline?: N/A patient is not a smoker  Patient has been referred for addiction treatment: N/A  Otelia Santee, LCSW 02/24/2020, 10:53 AM

## 2020-02-24 NOTE — Discharge Summary (Signed)
Physician Discharge Summary Note  Patient:  Barbara George is an 24 y.o., female  MRN:  [id number]  DOB:  Sep 04, 1995  Patient phone:  [phone] (home)   Patient address:   [address] [place] [address],   Total Time spent with patient: Greater than 30 minutes  Date of Admission:  02/21/2020  Date of Discharge: 02-23-20  Reason for Admission: Worsening insomnia & change in mental status.  Principal Problem: Schizoaffective disorder, bipolar type [facility])  Discharge Diagnoses: Principal Problem:   Schizoaffective disorder, bipolar type (HCC) Active Problems:   Psychosis (HCC)  Past Psychiatric History:   Past Medical History:  Past Medical History:  Diagnosis Date   Bipolar 1 disorder (HCC)    Depression    Diabetes mellitus without complication (HCC)    Type 2 (Pre)   Schizophrenia (HCC)    History reviewed. No pertinent surgical history.  Family History:  Family History  Problem Relation Age of Onset   Hypertension Father    Family Psychiatric  History: See H&P  Social History:  Social History   Substance and Sexual Activity  Alcohol Use No   Alcohol/week: 0.0 standard drinks     Social History   Substance and Sexual Activity  Drug Use No    Social History   Socioeconomic History   Marital status: Married    Spouse name: Amadou   Number of children: Not on file   Years of education: Not on file   Highest education level: Associate degree: occupational, Scientist, product/process development, or vocational program  Occupational History   Occupation: unemployed  Tobacco Use   Smoking status: Never Smoker   Smokeless tobacco: Never Used  Building services engineer Use: Never used  Substance and Sexual Activity   Alcohol use: No    Alcohol/week: 0.0 standard drinks   Drug use: No   Sexual activity: Yes    Birth control/protection: None  Other Topics Concern   Not on file  Social History Narrative   ** Merged History Encounter **        Social Determinants of Health   Financial Resource Strain:    Difficulty of Paying Living Expenses: Not on file  Food Insecurity: No Food Insecurity   Worried About Programme researcher, broadcasting/film/video in the Last Year: Never true   Ran Out of Food in the Last Year: Never true  Transportation Needs: No Transportation Needs   Lack of Transportation (Medical): No   Lack of Transportation (Non-Medical): No  Physical Activity: Inactive   Days of Exercise per Week: 0 days   Minutes of Exercise per Session: 0 min  Stress:    Feeling of Stress : Not on file  Social Connections: Socially Integrated   Frequency of Communication with Friends and Family: More than three times a week   Frequency of Social Gatherings with Friends and Family: Once a week   Attends Religious Services: More than 4 times per year   Active Member of [employer] or Organizations: Yes   Attends Banker Meetings: Not on file   Marital Status: Married   Hospital Course: (Per Md's admission evaluation notes): Patient is seen and examined. Patient is a 24 year old female with a past psychiatric history significant for schizoaffective disorder who presented to the [facility] urgent care center on 02/20/2020. At that time the patient answered questions, but was very quiet and would sit with her eyes closed. The [facility] apparently [facility]  from 02/04/2020. She was sent to the [facility] emergency department to rule out any medical causes. The patient continued to be selectively mute. The only thing that they were able to obtain in the emergency department was a "that stated "I have been following my dreams, I had a dream about God." She was assessed by the telemetry assessment service and additional information collected revealed that the patient had been brought secondary to insomnia and a change in mental status. She again would not provide a great  deal of information. The patient acknowledged at that time that she had not been sleeping. She apparently had been [facility] 2 due to insomnia. The husband stated at that time that "they put her on a medicine to help her sleep, but she is still acting the same". On examination today she is selectively mute. She is tearful. She did state that she has a relationship with God. Review of the electronic medical record revealed that the patient had been admitted to our facility in May 2020. She was noted at that time to be disorganized and delusional. She was concerned about voodoo and was hyper religious. According to the electronic medical record at that time she had previously been treated with Abilify p.o. as well as the Abilify maintain injection. On her admission on 5/21 she was started on Zyprexa 5 mg. She was basically held on an observation basis, and then transferred to [facility] for inpatient psychiatric treatment. She had a follow-up gynecological examination on 04/03/2019. List of medications at that time included the long-acting Abilify injection. There was also a hospitalization in 2018 at [facility]. She was diagnosed with schizoaffective disorder at that time. There is also concern for underlying bipolar disorder. She was discharged on temazepam 30 mg p.o. nightly, topiramate 25 mg p.o. twice daily,Abilify 10 mg p.o. daily and the long-acting Abilify injection 400 mg q. 4 weeks. She was also prescribed Depakote extended release 250 mg in the morning, and 500 mg p.o. nightly. She was also prescribed Zyprexa 20 mg p.o. nightly and famotidine. In an obstetrical note from July 02, 2019 with regard to her abnormal uterine bleeding. That the patient had reported her irregular menses started about the time that she had started the long-acting Abilify injections. It looks like after that she was on the olanzapine 10 mg p.o. nightly alone. A more recent  visit to the gynecologist revealed olanzapine 10 mg p.o. nightly as the only psychiatric medication at that time. She was admitted to the hospital for evaluation and stabilization.  After evaluation of her presenting symptoms, [facility] was recommended for mood stabilization treatments. The medication regimen for her presenting symptoms were discussed & initiated. She received, stabilized & was discharged on the medications as listed below on his discharge medication lists. She was also enrolled & participated in the group counseling sessions being offered & held on this unit. She learned coping skills. Swayze presented on this admission, no other chronic medical conditions that required treatment & monitoring. She tolerated her treatment regimen without any adverse effects or reactions reported.   During the course of her hospitalization, the 15-minute checks were adequate to ensure Annjeanette's safety.  Patient did not display any dangerous, violent or suicidal behavior on the unit.  She barely interacted with the other patients & or staff. She participated some in the group sessions/therapies. Her medications were addressed & adjusted to meet her needs. She was recommended for outpatient follow-up care &  medication management upon discharge to assure her continuity of care.  At the time of this hospital discharge, patient is not reporting any acute suicidal/homicidal ideations. She feels more confident about her mental state. She currently denies any new issues or concerns. Education and supportive counseling provided throughout his hospital stay & upon discharge.   Today upon her discharge evaluation with the attending psychiatrist, Aleysha shares she is doing well. She denies any other specific concerns. She is sleeping well. Her appetite is good. She denies other physical complaints. She denies AH/VH, delusional thoughts or paranoia. She feels that her medications have been helpful & is in  agreement to continue his current treatment regimen as recommended. She was able to engage in safety planning including plan to return to [facility] or contact emergency services if she feels unable to maintain her own safety or the safety of others. Pt had no further questions, comments, or concerns. She left [facility] with all personal belongings in no apparent distress. Transportation per her husband.   Physical Findings: AIMS: Facial and Oral Movements Muscles of Facial Expression: None, normal Lips and Perioral Area: None, normal Jaw: None, normal Tongue: None, normal,Extremity Movements Upper (arms, wrists, hands, fingers): None, normal Lower (legs, knees, ankles, toes): None, normal, Trunk Movements Neck, shoulders, hips: None, normal, Overall Severity Severity of abnormal movements (highest score from questions above): None, normal Incapacitation due to abnormal movements: None, normal Patient's awareness of abnormal movements (rate only patient's report): No Awareness, Dental Status Current problems with teeth and/or dentures?: No Does patient usually wear dentures?: No  CIWA:    COWS:     Musculoskeletal: Strength & Muscle Tone: within normal limits Gait & Station: normal Patient leans: N/A  Psychiatric Specialty Exam: Physical Exam Vitals and nursing note reviewed.  HENT:     Head: Normocephalic.     Nose: Nose normal.     Mouth/Throat:     Pharynx: Oropharynx is clear.  Eyes:     Pupils: Pupils are equal, round, and reactive to light.  Cardiovascular:     Rate and Rhythm: Normal rate.     Comments: Elevated pulse rate Pulmonary:     Effort: Pulmonary effort is normal.  Genitourinary:    Comments: Deferred Musculoskeletal:        General: Normal range of motion.     Cervical back: Normal range of motion.  Skin:    General: Skin is warm and dry.  Neurological:     General: No focal deficit present.     Mental Status: She is alert and oriented to person, place, and time.  Mental status is at baseline.     Review of Systems  Constitutional: Negative for chills, diaphoresis and fever.  HENT: Negative for congestion, rhinorrhea, sneezing and sore throat.   Eyes: Negative for discharge.  Respiratory: Negative for cough, chest tightness, shortness of breath and wheezing.   Cardiovascular: Negative for chest pain and palpitations.  Gastrointestinal: Negative for diarrhea, nausea and vomiting.  Endocrine: Negative for cold intolerance.  Genitourinary: Negative for difficulty urinating.  Musculoskeletal: Negative for arthralgias and myalgias.  Skin: Negative.   Allergic/Immunologic: Positive for food allergies ( Pork, Peanuts). Negative for environmental allergies and immunocompromised state.       Allergies:  Peanut- Pork-derived Products.       Neurological: Negative for dizziness, tremors, seizures, syncope, facial asymmetry, speech difficulty, weakness, light-headedness, numbness and headaches.  Psychiatric/Behavioral: Positive for dysphoric mood (Stabilized with medication prior to discharge), hallucinations (Hx. Delusion/psychosis (Stabilized with medication  upon discharge).) and sleep disturbance (Stabilized with medication prior to discharge). Negative for agitation, behavioral problems, confusion, decreased concentration, self-injury and suicidal ideas. The patient is not nervous/anxious (Stable upon discharge) and is not hyperactive.     Blood pressure 91/70, pulse (!) 133, temperature 98.1 F (36.7 C), temperature source Oral, resp. rate 18, height 5\' 8"  (1.727 m), weight 95.3 kg, last menstrual period 02/08/2020, SpO2 100 %.Body mass index is 31.93 kg/m.  See Md's discharge SRA  Sleep:  Number of Hours: 4    Has this patient used any form of tobacco in the last 30 days? (Cigarettes, Smokeless Tobacco, Cigars, and/or Pipes): N/A  Blood Alcohol level:  Lab Results  Component Value Date   ETH <10 02/20/2020   ETH <10 03/22/2018   Metabolic  Disorder Labs:  Lab Results  Component Value Date   HGBA1C 5.5 10/23/2018   MPG 111.15 10/23/2018   MPG 105 08/09/2016   No results found for: PROLACTIN Lab Results  Component Value Date   CHOL 176 10/23/2018   TRIG 50 10/23/2018   HDL 71 10/23/2018   CHOLHDL 2.5 10/23/2018   VLDL 10 10/23/2018   LDLCALC 95 10/23/2018   LDLCALC 71 08/09/2016   See Psychiatric Specialty Exam and Suicide Risk Assessment completed by Attending Physician prior to discharge.  Discharge destination:  Home  Is patient on multiple antipsychotic therapies at discharge:  No   Has Patient had three or more failed trials of antipsychotic monotherapy by history:  No  Recommended Plan for Multiple Antipsychotic Therapies: NA  Allergies as of 02/24/2020      Reactions   Peanut-containing Drug Products Nausea And Vomiting   Pork-derived Products Other (See Comments), Rash   Religious purposes Religious purposes      Medication List    TAKE these medications     Indication  OLANZapine 15 MG tablet Commonly known as: ZYPREXA Take 1 tablet (15 mg total) by mouth at bedtime. For mood control What changed:   medication strength  how much to take  additional instructions  Indication: Mood control   traZODone 50 MG tablet Commonly known as: DESYREL Take 1 tablet (50 mg total) by mouth at bedtime as needed for sleep.  Indication: Trouble Sleeping       Follow-up Information    [facility]. Go on 03/07/2020.   Specialty: Behavioral Health Why: You have a walk in appointment for therapy on 03/07/20 at 3:30 pm.  You also have an appointment for medication management on 03/25/20 at 10:10 am.  These appointments will be held in person.  Contact information: [phone] [place] [phone]             Follow-up recommendations: Activity:  As tolerated Diet: As recommended by your primary care doctor. Keep all scheduled follow-up  appointments as recommended.    Comments: Prescriptions given at discharge.  Patient agreeable to plan.  Given opportunity to ask questions.  Appears to feel comfortable with discharge denies any current suicidal or homicidal thought. Patient is also instructed prior to discharge to: Take all medications as prescribed by his/her mental healthcare provider. Report any adverse effects and or reactions from the medicines to his/her outpatient provider promptly. Patient has been instructed & cautioned: To not engage in alcohol and or illegal drug use while on prescription medicines. In the event of worsening symptoms, patient is instructed to call the crisis hotline, 911 and or go to the nearest ED for appropriate  evaluation and treatment of symptoms. To follow-up with his/her primary care provider for your other medical issues, concerns and or health care needs.  Signed: Armandina StammerAgnes Dekari Bures, NP, PMHNP, FNP-BC 02/24/2020, 2:33 PM

## 2020-02-24 NOTE — Plan of Care (Signed)
Discharge note  Patient verbalizes readiness for discharge. Follow up plan explained, AVS, Transition record and SRA given. Prescriptions and teaching provided. Belongings returned and signed for. Suicide safety plan completed and signed. Patient verbalizes understanding. Patient denies SI/HI and assures this Clinical research associate they will seek assistance should that change. Patient discharged to lobby where father was waiting.  Problem: Activity: Goal: Will verbalize the importance of balancing activity with adequate rest periods Outcome: Adequate for Discharge   Problem: Education: Goal: Will be free of psychotic symptoms Outcome: Adequate for Discharge Goal: Knowledge of the prescribed therapeutic regimen will improve Outcome: Adequate for Discharge   Problem: Coping: Goal: Coping ability will improve Outcome: Adequate for Discharge Goal: Will verbalize feelings Outcome: Adequate for Discharge   Problem: Health Behavior/Discharge Planning: Goal: Compliance with prescribed medication regimen will improve Outcome: Adequate for Discharge   Problem: Nutritional: Goal: Ability to achieve adequate nutritional intake will improve Outcome: Adequate for Discharge   Problem: Role Relationship: Goal: Ability to communicate needs accurately will improve Outcome: Adequate for Discharge Goal: Ability to interact with others will improve Outcome: Adequate for Discharge   Problem: Safety: Goal: Ability to redirect hostility and anger into socially appropriate behaviors will improve Outcome: Adequate for Discharge Goal: Ability to remain free from injury will improve Outcome: Adequate for Discharge   Problem: Self-Care: Goal: Ability to participate in self-care as condition permits will improve Outcome: Adequate for Discharge   Problem: Self-Concept: Goal: Will verbalize positive feelings about self Outcome: Adequate for Discharge

## 2020-02-24 NOTE — [facility] Suicide Risk Assessment (Signed)
[facility] INPATIENT:  Family/Significant Other Suicide Prevention Education  Suicide Prevention Education: Education Completed; Father Midou Pablo Ledger [phone]), has been identified by the patient as the family member/significant other with whom the patient will be residing, and identified as the person(s) who will aid the patient in the event of a mental health crisis (suicidal ideations/suicide attempt).  With written consent from the patient, the family member/significant other has been provided the following suicide prevention education, prior to the and/or following the discharge of the patient.    CSW spoke with this patients father who stated he had no concerns with this patient discharging and reported that this patient is able to return home at discharge.   Ruthann Cancer MSW, LCSW Clincal Social Worker  [facility] =

## 2020-02-24 NOTE — Progress Notes (Signed)
Patient has been engaged with peers this shift.  Patient has been compliant with medications as has had no complaints.    Assess patient for safety, offer medications as prescribed, engage patient in 1:1 staff talks.  Continue to monitor as planned. Patient able to maintain safety

## 2020-02-24 NOTE — [facility] Suicide Risk Assessment (Signed)
[facility] Discharge Suicide Risk Assessment   Principal Problem: <principal problem not specified> Discharge Diagnoses: Active Problems:   Psychosis (HCC)   Total Time spent with patient: 20 minutes  Musculoskeletal: Strength & Muscle Tone: within normal limits Gait & Station: normal Patient leans: N/A  Psychiatric Specialty Exam: Review of Systems  Psychiatric/Behavioral: Positive for hallucinations.  All other systems reviewed and are negative.   Blood pressure 91/70, pulse (!) 133, temperature 98.1 F (36.7 C), temperature source Oral, resp. rate 18, height 5\' 8"  (1.727 m), weight 95.3 kg, last menstrual period 02/08/2020, SpO2 100 %.Body mass index is 31.93 kg/m.  General Appearance: Disheveled  Eye 04/09/2020::  Fair  Speech:  Normal Rate409  Volume:  Decreased  Mood:  Euthymic  Affect:  Congruent  Thought Process:  Goal Directed and Descriptions of Associations: Loose  Orientation:  Full (Time, Place, and Person)  Thought Content:  Delusions and Hallucinations: Auditory  Suicidal Thoughts:  No  Homicidal Thoughts:  No  Memory:  Immediate;   Fair Recent;   Fair Remote;   Fair  Judgement:  Intact  Insight:  Lacking  Psychomotor Activity:  Normal  Concentration:  Fair  Recall:  [id number] of Knowledge:Fair  Language: Fair  Akathisia:  Negative  Handed:  Right  AIMS (if indicated):     Assets:  Desire for Improvement Housing Resilience  Sleep:  Number of Hours: 4  Cognition: WNL  ADL's:  Intact   Mental Status Per Nursing Assessment::   On Admission:  NA  Demographic Factors:  Unemployed  Loss Factors: NA  Historical Factors: NA  Risk Reduction Factors:   Living with another person, especially a relative and Positive social support  Continued Clinical Symptoms:  Schizophrenia:   Less than 36 years old Paranoid or undifferentiated type  Cognitive Features That Contribute To Risk:  Closed-mindedness    Suicide Risk:  Minimal: No identifiable  suicidal ideation.  Patients presenting with no risk factors but with morbid ruminations; may be classified as minimal risk based on the severity of the depressive symptoms   Follow-up Information    [facility]. Go on 03/07/2020.   Specialty: Behavioral Health Why: You have a walk in appointment for therapy on 03/07/20 at 3:30 pm.  You also have an appointment for medication management on 03/25/20 at 10:10 am.  These appointments will be held in person.  Contact information: [address] [place] [phone]              Plan Of Care/Follow-up recommendations:  Activity:  ad lib  [phone], MD 02/24/2020, 9:17 AM

## 2020-03-25 ENCOUNTER — Ambulatory Visit (INDEPENDENT_AMBULATORY_CARE_PROVIDER_SITE_OTHER): Payer: Medicaid Other | Admitting: Psychiatry

## 2020-03-25 ENCOUNTER — Other Ambulatory Visit: Payer: Self-pay

## 2020-03-25 ENCOUNTER — Encounter (HOSPITAL_COMMUNITY): Payer: Self-pay | Admitting: Psychiatry

## 2020-03-25 VITALS — BP 118/76 | HR 78 | Ht 68.0 in | Wt 229.0 lb

## 2020-03-25 DIAGNOSIS — F250 Schizoaffective disorder, bipolar type: Secondary | ICD-10-CM

## 2020-03-25 MED ORDER — TRAZODONE HCL 50 MG PO TABS: 50.0000 mg | ORAL_TABLET | Freq: Every evening | ORAL | 1 refills | Status: DC | PRN

## 2020-03-25 MED ORDER — OLANZAPINE 15 MG PO TABS: 15.0000 mg | ORAL_TABLET | Freq: Every day | ORAL | 1 refills | Status: DC

## 2020-03-25 NOTE — Progress Notes (Signed)
Psychiatric Initial Adult Assessment   Patient Identification: Barbara George MRN:  [id number] Date of Evaluation:  03/25/2020   Referral Source: [facility]  Chief Complaint:  " I feel fine now."  Visit Diagnosis:    ICD-10-CM   1. Schizoaffective disorder, bipolar type (HCC)  F25.0     History of Present Illness: This is a 24 year old female with history of schizoaffective disorder bipolar type now seen for evaluation and establishing care.  Patient was recently admitted at [facility] from September 19 to September 21 for delusions and insomnia.  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  She was discharged on olanzapine 15 mg at bedtime, trazodone 50 mg at bedtime as needed.  As per EMR, she has had several hospitalizations and psychiatry facilities.  Her last admission to [facility] was in May 2020.  At that time, her thought process was noted to be disorganized and was also noted to be hyper religious.  She spoke about God and also was concerned about voodoo magic.  She was discharged on Abilify maintain injection. Following this she was also hospitalized at [facility] in May 2021 and was started on Zyprexa at that time. In the past she has been hospitalized at [facility] as well as [facility].  She has been prescribed several different psychotropic medications in the past.  She has been following up regularly with OB/GYN for abnormal uterine bleeding.  She was seen by OB/GYN in June 2021 and at that time she had expressed that she is trying to conceive.  Today, patient presented with her father.  Patient informed that she originally belongs to [place] and moved to the [place] at age of 61.  She now lives with her husband and a grown nephew. Writer offered the option of using [employer] services for language translation however patient and her father declined that.  Patient stated that she is feeling better now.  She denied any hallucinations or  delusions.  She reported that she is sleeping well.  She informed that her mood has been stable.  She denied any anxiety.  She denied any suicidal or homicidal ideations.  Writer asked her if she prefers to get back on monthly injectable.  She stated that she does not want to get back on any monthly injectable as it was too strong for her. Writer asked her about her plans to continue taking the olanzapine in the future.  She stated that honestly she does not want to because even when she takes her medications her sickness keeps coming back. Writer explained to her that it seems that she does really well when she takes her medication regularly.  Writer explained the relevance of compliance and adherence to treatment.  Patient reluctantly agreed.  Patient asked if she could be connected to any resources because she wants to find a better housing option for herself and her husband.  She does not have any children yet.  She informed her husband works in a Producer, television/film/video.  Writer introduced the patient to Ms. Ava Elisabeth Most for [employer] support.   Past Psychiatric History: Schizoaffective disorder, bipolar type. Has had several psychiatric hospitalizations in the past.  Has been hospitalized at [facility] in [place] and others.  Used to receive IM Abilify Maintena 400 mg injections between 2018 and 2020.  Previous Psychotropic Medications: Yes   Substance Abuse History in the last 12 months:  No.  Consequences of Substance  Abuse: NA  Past Medical History:  Past Medical History:  Diagnosis Date  . Bipolar 1 disorder (HCC)   . Depression   . 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 Schizophrenia (HCC)    No past surgical history on file.  Family Psychiatric History: denied  Family History:  Family History  Problem Relation Age of Onset  . Hypertension Father     Social History:   Social History   Socioeconomic History  . Marital  status: Married    Spouse name: Amadou  . Number of children: Not on file  . Years of education: Not on file  . Highest education level: Associate degree: occupational, Scientist, product/process development, or vocational program  Occupational History  . Occupation: unemployed  Tobacco Use  . Smoking status: Never Smoker  . Smokeless tobacco: Never Used  Vaping Use  . Vaping Use: Never used  Substance and Sexual Activity  . Alcohol use: No    Alcohol/week: 0.0 standard drinks  . Drug use: No  . Sexual activity: Yes    Birth control/protection: None  Other Topics Concern  . Not on file  Social History Narrative   ** Merged History Encounter **       Social Determinants of Health   Financial Resource Strain:   . Difficulty of Paying Living Expenses: Not on file  Food Insecurity: No Food Insecurity  . Worried About Programme researcher, broadcasting/film/video in the Last Year: Never true  . Ran Out of Food in the Last Year: Never true  Transportation Needs: No Transportation Needs  . Lack of Transportation (Medical): No  . Lack of Transportation (Non-Medical): No  Physical Activity: Inactive  . Days of Exercise per Week: 0 days  . Minutes of Exercise per Session: 0 min  Stress:   . Feeling of Stress : Not on file  Social Connections: Socially Integrated  . Frequency of Communication with Friends and Family: More than three times a week  . Frequency of Social Gatherings with Friends and Family: Once a week  . Attends Religious Services: More than 4 times per year  . Active Member of Clubs or Organizations: Yes  . Attends Banker Meetings: Not on file  . Marital Status: Married    Additional Social History: Lives with her husband, does not work, has no children.  Allergies:   Allergies  Allergen Reactions  . Peanut-Containing Drug Products Nausea And Vomiting  . Pork-Derived Products Other (See Comments) and Rash    Religious purposes Religious purposes    Metabolic Disorder Labs: Lab Results   Component Value Date   HGBA1C 5.5 10/23/2018   MPG 111.15 10/23/2018   MPG 105 08/09/2016   No results found for: PROLACTIN Lab Results  Component Value Date   CHOL 176 10/23/2018   TRIG 50 10/23/2018   HDL 71 10/23/2018   CHOLHDL 2.5 10/23/2018   VLDL 10 10/23/2018   LDLCALC 95 10/23/2018   LDLCALC 71 08/09/2016   Lab Results  Component Value Date   TSH 0.478 02/23/2020    Therapeutic Level Labs: No results found for: LITHIUM No results found for: CBMZ Lab Results  Component Value Date   VALPROATE 127 (H) 09/07/2016    Current Medications: Current Outpatient Medications  Medication Sig Dispense Refill  . OLANZapine (ZYPREXA) 15 MG tablet Take 1 tablet (15 mg total) by mouth at bedtime. For mood control 30 tablet 0  . traZODone (DESYREL) 50 MG tablet Take 1 tablet (  50 mg total) by mouth at bedtime as needed for sleep. 30 tablet 0   No current facility-administered medications for this visit.    Musculoskeletal: Strength & Muscle Tone: within normal limits Gait & Station: normal Patient leans: N/A  Psychiatric Specialty Exam: Review of Systems  Blood pressure 118/76, pulse 78, height 5\' 8"  (1.727 m), weight 229 lb (103.9 kg), SpO2 99 %.Body mass index is 34.82 kg/m.  General Appearance: Fairly Groomed  Eye Contact:  Good  Speech:  Clear and Coherent and Normal Rate  Volume:  Normal  Mood:  Euthymic  Affect:  Congruent  Thought Process:  Goal Directed and Descriptions of Associations: Intact  Orientation:  Full (Time, Place, and Person)  Thought Content:  Logical and denies hallucinations, denies any delusions today  Suicidal Thoughts:  No  Homicidal Thoughts:  No  Memory:  Immediate;   Good Recent;   Good  Judgement:  Fair  Insight:  Fair  Psychomotor Activity:  Normal  Concentration:  Concentration: Good and Attention Span: Good  Recall:  Good  Fund of Knowledge:Good  Language: Good  Akathisia:  Negative  Handed:  Right  AIMS (if indicated):  0   Assets:  Communication Skills Desire for Improvement Financial Resources/Insurance Housing Social Support  ADL's:  Intact  Cognition: WNL  Sleep:  Good   Screenings: AIMS     [facility] Admission (Discharged) from 10/23/2018 in [facility]  AIMS Total Score 0 0    AUDIT     [facility]) [facility] Admission (Discharged) from 08/08/2016 in [facility]  Alcohol Use Disorder Identification Test Final Score (AUDIT) 0 0    PHQ2-9     Office Visit from 09/21/2014 in Primary Care at [facility] Total Score 1      Assessment and Plan: Patient appears to be doing fairly well at present.  She reported that she has no hallucinations or delusions today.  She has been sleeping well.  She denied any acute concerns today.  She does seem to be questioning whether she really needs to be taking the medicine regularly for long-term.  She stated that even when she took the medicine regularly in the past she had relapse of symptoms.  1. Schizoaffective disorder, bipolar type (HCC)  -Continue OLANZapine (ZYPREXA) 15 MG tablet; Take 1 tablet (15 mg total) by mouth at bedtime. For mood control  Dispense: 30 tablet; Refill: 1 -Continue traZODone (DESYREL) 50 MG tablet; Take 1 tablet (50 mg total) by mouth at bedtime as needed for sleep.  Dispense: 30 tablet; Refill: 1  Continue same medication regimen. Follow up in  6 weeks.  Pt was seen by Ms. Ava [facility] for Elisabeth Most for housing options. The patient was linked given resources for [email or web address] because she receives a disability check and is able to pay $500 a month towards rent. The Housing Authority is only accepting applications for seniors ages 60 and over. The patient was also linked with the [facility] program. Ms. [facility] informed  her if she needs any further assistance she is always able to return back to talk to her.   Virgel Manifold, MD 10/22/202110:30 AM

## 2020-03-29 ENCOUNTER — Encounter (HOSPITAL_COMMUNITY): Payer: Self-pay | Admitting: Psychiatry

## 2020-03-29 ENCOUNTER — Telehealth (HOSPITAL_COMMUNITY): Payer: Medicaid Other

## 2020-03-29 NOTE — BH Assessment (Signed)
   Writer discussed housing options for the patient.  Patient is currently living with her husband and her husband's Uncle.  Writer provided follow up referral information for the patient to obtain housing.   Writer referred patient to the [employer].  This program is set up for individuals that are homeless.  The program requires a referral from a provider stating that the patient is homeless.  Writer provided information to the patient psychiatrist (Dr.Kaur).   Patient will then be able to take the letter to [facility] and apply to their housing program in person.   This program is affiliated with [facility] and in order for the patient to participate in the program she must have stable income and a letter from a provider verifying that she is homeless.

## 2020-04-01 ENCOUNTER — Telehealth (HOSPITAL_COMMUNITY): Payer: Medicaid Other | Admitting: General Practice

## 2020-04-01 NOTE — BH Assessment (Signed)
Writer left a voice mail message in order to give the client a referral letter for housing to the [employer].

## 2020-05-05 ENCOUNTER — Other Ambulatory Visit: Payer: Self-pay

## 2020-05-05 ENCOUNTER — Encounter: Payer: Self-pay | Admitting: Obstetrics and Gynecology

## 2020-05-05 ENCOUNTER — Ambulatory Visit (INDEPENDENT_AMBULATORY_CARE_PROVIDER_SITE_OTHER): Payer: Medicaid Other | Admitting: Obstetrics and Gynecology

## 2020-05-05 VITALS — BP 118/80 | HR 93 | Wt 240.8 lb

## 2020-05-05 DIAGNOSIS — Z319 Encounter for procreative management, unspecified: Secondary | ICD-10-CM | POA: Diagnosis not present

## 2020-05-05 LAB — POCT URINE PREGNANCY: Preg Test, Ur: NEGATIVE

## 2020-05-05 MED ORDER — LETROZOLE 2.5 MG PO TABS: 2.5000 mg | ORAL_TABLET | Freq: Every day | ORAL | 2 refills | Status: DC

## 2020-05-05 NOTE — Progress Notes (Signed)
Pt presents to f/u trying to conceive.  UPT - negative

## 2020-05-05 NOTE — Progress Notes (Signed)
24 yo P0 presenting today as a follow up on infertility. Patient has been trying to conceive for the past 6 months without success. Patient reports a monthly period lasting 5 days. She is timing her intercourse as previously discussed with fertile window. Patient is taking prenatal vitamins. Her husband who has never fathered a child, has not had an evaluation with a urologist  Past Medical History:  Diagnosis Date  . Bipolar 1 disorder (HCC)   . Depression   . 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 Schizophrenia (HCC)    History reviewed. No pertinent surgical history. Family History  Problem Relation Age of Onset  . Hypertension Father    Social History   Tobacco Use  . Smoking status: Never Smoker  . Smokeless tobacco: Never Used  Vaping Use  . Vaping Use: Never used  Substance Use Topics  . Alcohol use: No    Alcohol/week: 0.0 standard drinks  . Drug use: No   ROS See pertinent in HPI. All other systems non contributory  Blood pressure 118/80, pulse 93, weight 240 lb 12.8 oz (109.2 kg), last menstrual period 04/21/2020. GENERAL: Well-developed, well-nourished female in no acute distress.  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

## 2020-05-12 ENCOUNTER — Ambulatory Visit (HOSPITAL_COMMUNITY): Payer: Medicaid Other | Admitting: Psychiatry

## 2020-05-15 ENCOUNTER — Other Ambulatory Visit (HOSPITAL_COMMUNITY): Payer: Self-pay | Admitting: Psychiatry

## 2020-05-15 DIAGNOSIS — F250 Schizoaffective disorder, bipolar type: Secondary | ICD-10-CM

## 2020-06-27 ENCOUNTER — Other Ambulatory Visit: Payer: Self-pay

## 2020-06-27 ENCOUNTER — Encounter (HOSPITAL_COMMUNITY): Payer: Self-pay | Admitting: Psychiatry

## 2020-06-27 ENCOUNTER — Ambulatory Visit (INDEPENDENT_AMBULATORY_CARE_PROVIDER_SITE_OTHER): Payer: Medicaid Other | Admitting: Psychiatry

## 2020-06-27 DIAGNOSIS — F250 Schizoaffective disorder, bipolar type: Secondary | ICD-10-CM | POA: Diagnosis not present

## 2020-06-27 MED ORDER — OLANZAPINE 15 MG PO TABS: 15.0000 mg | ORAL_TABLET | Freq: Every day | ORAL | 1 refills | Status: DC

## 2020-06-27 NOTE — Progress Notes (Signed)
BH OP Progress Note   Patient Identification: Barbara George MRN:  [id number] Date of Evaluation:  06/27/2020    Chief Complaint:  " I am doing good."  Visit Diagnosis:    ICD-10-CM   1. Schizoaffective disorder, bipolar type (HCC)  F25.0     History of Present Illness: Patient was last seen in October for initial evaluation.  Patient no-show for her following appointment however presented today for follow-up.  She reported that she is doing well.  She informed that she and her husband are trying to start a family.  She will impulsively asked the writer if we can do a pregnancy test for her here. Regarding her medication compliance, patient stated that she has been taking olanzapine regularly.  Writer asked her if that was true because last time she had stated that she did not want to continue the medication.  She stated that it does help her with her sleep and her mood so she is continuing to take it for now. She denied any auditory or visualizations.  She denied any paranoid delusions. She denied any suicidal or homicidal ideations.    She requested for refills to be sent to her pharmacy.  Regarding her housing condition, she stated that she and her husband still live with her other family relative and they are still looking for a different place to live.   Past Psychiatric History: Schizoaffective disorder, bipolar type. Has had several psychiatric hospitalizations in the past.  Has been hospitalized at [facility] in [place] and others.  Used to receive IM Abilify Maintena 400 mg injections between 2018 and 2020.  Previous Psychotropic Medications: Yes   Substance Abuse History in the last 12 months:  No.  Consequences of Substance Abuse: NA  Past Medical History:  Past Medical History:  Diagnosis Date  . Bipolar 1 disorder (HCC)   . Depression   . 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 Schizophrenia (HCC)    No past  surgical history on file.  Family Psychiatric History: denied  Family History:  Family History  Problem Relation Age of Onset  . Hypertension Father     Social History:   Social History   Socioeconomic History  . Marital status: Married    Spouse name: Amadou  . Number of children: Not on file  . Years of education: Not on file  . Highest education level: Associate degree: occupational, Scientist, product/process development, or vocational program  Occupational History  . Occupation: unemployed  Tobacco Use  . Smoking status: Never Smoker  . Smokeless tobacco: Never Used  Vaping Use  . Vaping Use: Never used  Substance and Sexual Activity  . Alcohol use: No    Alcohol/week: 0.0 standard drinks  . Drug use: No  . Sexual activity: Yes    Birth control/protection: None  Other Topics Concern  . Not on file  Social History Narrative   ** Merged History Encounter **       Social Determinants of Health   Financial Resource Strain: Not on file  Food Insecurity: Not on file  Transportation Needs: Not on file  Physical Activity: Not on file  Stress: Not on file  Social Connections: Not on file    Additional Social History: Lives with her husband, does not work, has no children.  Allergies:   Allergies  Allergen Reactions  . Peanut-Containing Drug Products Nausea And Vomiting  . Pork-Derived Products Other (See Comments) and Rash  Religious purposes Religious purposes    Metabolic Disorder Labs: Lab Results  Component Value Date   HGBA1C 5.5 10/23/2018   MPG 111.15 10/23/2018   MPG 105 08/09/2016   No results found for: PROLACTIN Lab Results  Component Value Date   CHOL 176 10/23/2018   TRIG 50 10/23/2018   HDL 71 10/23/2018   CHOLHDL 2.5 10/23/2018   VLDL 10 10/23/2018   LDLCALC 95 10/23/2018   LDLCALC 71 08/09/2016   Lab Results  Component Value Date   TSH 0.478 02/23/2020    Therapeutic Level Labs: No results found for: LITHIUM No results found for: CBMZ Lab Results   Component Value Date   VALPROATE 127 (H) 09/07/2016    Current Medications: Current Outpatient Medications  Medication Sig Dispense Refill  . letrozole (FEMARA) 2.5 MG tablet Take 1 tablet (2.5 mg total) by mouth daily. Take on days 3 to 7 following a spontaneous menses or progestin-induced bleed. 5 tablet 2  . OLANZapine (ZYPREXA) 15 MG tablet TAKE 1 TABLET(15 MG) BY MOUTH AT BEDTIME FOR MOOD 30 tablet 1  . traZODone (DESYREL) 50 MG tablet Take 1 tablet (50 mg total) by mouth at bedtime as needed for sleep. (Patient not taking: Reported on 05/05/2020) 30 tablet 1   No current facility-administered medications for this visit.    Musculoskeletal: Strength & Muscle Tone: within normal limits Gait & Station: normal Patient leans: N/A  Psychiatric Specialty Exam: Review of Systems  There were no vitals taken for this visit.There is no height or weight on file to calculate BMI.  General Appearance: Fairly Groomed  Eye Contact:  Good  Speech:  Clear and Coherent and Normal Rate  Volume:  Normal  Mood:  Euthymic  Affect:  Congruent  Thought Process:  Goal Directed and Descriptions of Associations: Intact  Orientation:  Full (Time, Place, and Person)  Thought Content:  Logical and denies hallucinations, denies any delusions today  Suicidal Thoughts:  No  Homicidal Thoughts:  No  Memory:  Immediate;   Good Recent;   Good  Judgement:  Fair  Insight:  Fair  Psychomotor Activity:  Normal  Concentration:  Concentration: Good and Attention Span: Good  Recall:  Good  Fund of Knowledge:Good  Language: Good  Akathisia:  Negative  Handed:  Right  AIMS (if indicated):  0  Assets:  Communication Skills Desire for Improvement Financial Resources/Insurance Housing Social Support  ADL's:  Intact  Cognition: WNL  Sleep:  Good   Screenings: AIMS   Flowsheet Row [facility] Admission (Discharged) from 10/23/2018  in [facility]  AIMS Total Score 0 0    AUDIT   Flowsheet Row Ad[facility]) [facility] Admission (Discharged) from 08/08/2016 in [facility]ICINE  Alcohol Use Disorder Identification Test Final Score (AUDIT) 0 0    PHQ2-9   Flowsheet Row Office Visit from 09/21/2014 in Primary Care at [facility] Total Score 1      Assessment and Plan: Pt appears to be stable on current regimen we will keep the medications same way they are.  Patient is trying to start a family as noted by her EMR documentation.  She was recently prescribed letrozole for ovulation induction.   1. Schizoaffective disorder, bipolar type (HCC)  -Continue OLANZapine (ZYPREXA) 15 MG tablet; Take 1 tablet (15 mg total) by mouth at bedtime. For mood control  Dispense: 30 tablet; Refill:  1  Continue same medication regimen. Follow up in  2 months.    Zena Amos, MD 1/24/20223:35 PM

## 2020-07-28 ENCOUNTER — Ambulatory Visit (INDEPENDENT_AMBULATORY_CARE_PROVIDER_SITE_OTHER): Payer: Medicaid Other | Admitting: Obstetrics and Gynecology

## 2020-07-28 ENCOUNTER — Other Ambulatory Visit: Payer: Self-pay

## 2020-07-28 ENCOUNTER — Encounter: Payer: Self-pay | Admitting: Obstetrics and Gynecology

## 2020-07-28 VITALS — BP 129/90 | HR 120 | Wt 251.0 lb

## 2020-07-28 DIAGNOSIS — Z319 Encounter for procreative management, unspecified: Secondary | ICD-10-CM | POA: Diagnosis not present

## 2020-07-28 DIAGNOSIS — Z3183 Encounter for assisted reproductive fertility procedure cycle: Secondary | ICD-10-CM

## 2020-07-28 NOTE — Progress Notes (Signed)
Patient presents for F/U visit for Infertility Management.  Last seen 05/05/2020.

## 2020-07-28 NOTE — Progress Notes (Signed)
25 yo P0 returning for infertility management. Patient reports taking femara last month for the first time without success. Patient is without any complaints. She denies any changes in her health or her partner's health  Past Medical History:  Diagnosis Date  . Bipolar 1 disorder (HCC)   . Depression   . 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 Schizophrenia (HCC)    History reviewed. No pertinent surgical history. Family History  Problem Relation Age of Onset  . Hypertension Father    Social History   Tobacco Use  . Smoking status: Never Smoker  . Smokeless tobacco: Never Used  Vaping Use  . Vaping Use: Never used  Substance Use Topics  . Alcohol use: No    Alcohol/week: 0.0 standard drinks  . Drug use: No   ROS See pertinent in HPI. All other systems reviewed and non contributory  Blood pressure 129/90, pulse (!) 120, weight 251 lb (113.9 kg), last menstrual period 07/03/2020. GENERAL: Well-developed, well-nourished female in no acute distress.  NEURO: alert and oriented x 3 EXTREMITIES: No cyanosis, clubbing, or edema, 2+ distal pulses.  A/P 25 yo P0 here for infertility management - Patient is aware that she has 2 more refills of the Femara - HSG ordered - Referral to infertility specialist also placed - Discussed semen analysis as well via PCP

## 2020-08-15 ENCOUNTER — Ambulatory Visit
Admission: RE | Admit: 2020-08-15 | Discharge: 2020-08-15 | Disposition: A | Discharge status: Home / Self Care | Payer: Medicaid Other | Source: Ambulatory Visit | Attending: Obstetrics and Gynecology | Admitting: Obstetrics and Gynecology

## 2020-08-15 DIAGNOSIS — Z319 Encounter for procreative management, unspecified: Secondary | ICD-10-CM

## 2020-08-29 ENCOUNTER — Encounter (HOSPITAL_COMMUNITY): Payer: Self-pay | Admitting: Psychiatry

## 2020-08-29 ENCOUNTER — Ambulatory Visit (INDEPENDENT_AMBULATORY_CARE_PROVIDER_SITE_OTHER): Payer: Medicaid Other | Admitting: Psychiatry

## 2020-08-29 ENCOUNTER — Other Ambulatory Visit: Payer: Self-pay

## 2020-08-29 VITALS — BP 126/64 | HR 98 | Ht 68.0 in | Wt 257.0 lb

## 2020-08-29 DIAGNOSIS — F250 Schizoaffective disorder, bipolar type: Secondary | ICD-10-CM | POA: Diagnosis not present

## 2020-08-29 MED ORDER — OLANZAPINE 15 MG PO TABS: 15.0000 mg | ORAL_TABLET | Freq: Every day | ORAL | 2 refills | Status: DC

## 2020-08-29 NOTE — Progress Notes (Signed)
BH OP Progress Note   Patient Identification: Barbara George MRN:  [id number] Date of Evaluation:  08/29/2020    Chief Complaint:  "I am doing okay."  Visit Diagnosis:    ICD-10-CM   1. Schizoaffective disorder, bipolar type (HCC)  F25.0     History of Present Illness: Patient reported she is doing fairly well.  She denied any hallucinations or delusions.  Her mood has been stable.  She is sleeping well at night. As per EMR she has started fertility treatments and is currently taking Femara.  She is also been referred for HSG. Patient stated that she feels she is making some progress in that area. Patient asked if writer does anything about the housing issue.  She stated that she filled out the application had applied however she has not heard back from them she has heard that she can call them and ask about her application status. Writer suggested that if she has a number to contact and she can contact them and see if there is any update for her, however, unfortunately there is a long waiting list.  Patient stated that she is trying hard to lose weight.  Writer suggested that she tries to eat more foods and cut down on all the unhealthy snacks.  Writer advised her to start going on walks on a consistent basis.  Past Psychiatric History: Schizoaffective disorder, bipolar type. Has had several psychiatric hospitalizations in the past.  Has been hospitalized at [facility], [facility] in [place] and others.  Used to receive IM Abilify Maintena 400 mg injections between 2018 and 2020.  Previous Psychotropic Medications: Yes   Substance Abuse History in the last 12 months:  No.  Consequences of Substance Abuse: NA  Past Medical History:  Past Medical History:  Diagnosis Date  . Bipolar 1 disorder (HCC)   . Depression   . 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 Schizophrenia (HCC)    No past surgical history on file.  Family Psychiatric  History: denied  Family History:  Family History  Problem Relation Age of Onset  . Hypertension Father     Social History:   Social History   Socioeconomic History  . Marital status: Married    Spouse name: Amadou  . Number of children: Not on file  . Years of education: Not on file  . Highest education level: Associate degree: occupational, Scientist, product/process development, or vocational program  Occupational History  . Occupation: unemployed  Tobacco Use  . Smoking status: Never Smoker  . Smokeless tobacco: Never Used  Vaping Use  . Vaping Use: Never used  Substance and Sexual Activity  . Alcohol use: No    Alcohol/week: 0.0 standard drinks  . Drug use: No  . Sexual activity: Yes    Birth control/protection: None  Other Topics Concern  . Not on file  Social History Narrative   ** Merged History Encounter **       Social Determinants of Health   Financial Resource Strain: Not on file  Food Insecurity: Not on file  Transportation Needs: Not on file  Physical Activity: Not on file  Stress: Not on file  Social Connections: Not on file    Additional Social History: Lives with her husband, does not work, has no children.  Allergies:   Allergies  Allergen Reactions  . Peanut-Containing Drug Products Nausea And Vomiting  . Pork-Derived Products Other (See Comments) and Rash    Religious purposes Religious purposes  Metabolic Disorder Labs: Lab Results  Component Value Date   HGBA1C 5.5 10/23/2018   MPG 111.15 10/23/2018   MPG 105 08/09/2016   No results found for: PROLACTIN Lab Results  Component Value Date   CHOL 176 10/23/2018   TRIG 50 10/23/2018   HDL 71 10/23/2018   CHOLHDL 2.5 10/23/2018   VLDL 10 10/23/2018   LDLCALC 95 10/23/2018   LDLCALC 71 08/09/2016   Lab Results  Component Value Date   TSH 0.478 02/23/2020    Therapeutic Level Labs: No results found for: LITHIUM No results found for: CBMZ Lab Results  Component Value Date   VALPROATE 127 (H)  09/07/2016    Current Medications: Current Outpatient Medications  Medication Sig Dispense Refill  . letrozole (FEMARA) 2.5 MG tablet Take 1 tablet (2.5 mg total) by mouth daily. Take on days 3 to 7 following a spontaneous menses or progestin-induced bleed. 5 tablet 2  . OLANZapine (ZYPREXA) 15 MG tablet Take 1 tablet (15 mg total) by mouth at bedtime. 30 tablet 1   No current facility-administered medications for this visit.    Musculoskeletal: Strength & Muscle Tone: within normal limits Gait & Station: normal Patient leans: N/A  Psychiatric Specialty Exam: Review of Systems  Blood pressure 126/64, pulse 98, height 5\' 8"  (1.727 m), weight 257 lb (116.6 kg), last menstrual period 08/08/2020, SpO2 99 %.Body mass index is 39.08 kg/m.  General Appearance: Fairly Groomed  Eye Contact:  Good  Speech:  Clear and Coherent and Normal Rate  Volume:  Normal  Mood:  Euthymic  Affect:  Congruent  Thought Process:  Goal Directed and Descriptions of Associations: Intact  Orientation:  Full (Time, Place, and Person)  Thought Content:  Logical and denies hallucinations, denies any delusions today  Suicidal Thoughts:  No  Homicidal Thoughts:  No  Memory:  Immediate;   Good Recent;   Good  Judgement:  Fair  Insight:  Fair  Psychomotor Activity:  Normal  Concentration:  Concentration: Good and Attention Span: Good  Recall:  Good  Fund of Knowledge:Good  Language: Good  Akathisia:  Negative  Handed:  Right  AIMS (if indicated):  0  Assets:  Communication Skills Desire for Improvement Financial Resources/Insurance Housing Social Support  ADL's:  Intact  Cognition: WNL  Sleep:  Good   Screenings: AIMS   Flowsheet Row [facility] Admission (Discharged) from 10/23/2018 [facility]  AIMS Total Score 0 0    AUDIT   Flowsheet Row [facility]) [facility] (Discharged) from 08/08/2016 in [facility]  Alcohol Use Disorder Identification Test Final Score (AUDIT) 0 0    PHQ2-9   Flowsheet Row Office Visit from 09/21/2014 in [facility] at [facility] Total Score 1    Flowsheet Row [facility]  C-SSRS RISK CATEGORY No Risk      Assessment and Plan: Pt appears to be stable in terms of her psychiatric symptoms. She is undergo infertility treatment and work up. She is dealing waiting to hear about housing assistance.  1. Schizoaffective disorder, bipolar type (HCC)  -Continue OLANZapine (ZYPREXA) 15 MG tablet; Take 1 tablet (15 mg total) by mouth at bedtime. For mood control  Dispense: 30 tablet; Refill: 1  Continue same medication regimen. F/up in 10 weeks.    02/23/2020, MD 3/28/20223:11 PM

## 2020-10-27 ENCOUNTER — Encounter: Payer: Self-pay | Admitting: Obstetrics and Gynecology

## 2020-11-08 ENCOUNTER — Other Ambulatory Visit: Payer: Self-pay

## 2020-11-08 ENCOUNTER — Encounter (HOSPITAL_COMMUNITY): Payer: Self-pay | Admitting: Psychiatry

## 2020-11-08 ENCOUNTER — Ambulatory Visit (INDEPENDENT_AMBULATORY_CARE_PROVIDER_SITE_OTHER): Payer: Medicaid Other | Admitting: Psychiatry

## 2020-11-08 DIAGNOSIS — F250 Schizoaffective disorder, bipolar type: Secondary | ICD-10-CM | POA: Diagnosis not present

## 2020-11-08 MED ORDER — OLANZAPINE 15 MG PO TABS: 15.0000 mg | ORAL_TABLET | Freq: Every day | ORAL | 2 refills | Status: DC

## 2020-11-08 NOTE — Progress Notes (Signed)
BH MD/PA/NP OP Progress Note  11/08/2020 3:54 PM Barbara George  MRN:  [id number]  Chief Complaint: " I have been doing okay"  HPI: A 25 year old female seen today for follow-up psychiatric evaluation.  She is a former patient of Dr. Quintella Baton is being transferred to writer for medication management.  She has a psychiatric history of bipolar 1 disorder, schizoaffective disorder bipolar type since, and psychosis.  She is currently managed on Zyprexa 15 mg nightly.  Today she notes her medications are effective in managing her psychiatric condition  On exam she is well-groomed, pleasant, cooperative, and engaged in conversation.  She informed provider that overall she is doing well.  She informed Clinical research associate that she is in the process of getting Medicaid and notes that she will attempt to go to social services today to get assistance with this.  Patient informed provider that her mood is stable and notes that she has very minimal anxiety and depression.  Today provider conducted a GAD-7 and patient scored a 0.  Provider also conducted a PHQ-9 and patient scored a2.  She endorses adequate sleep and appetite.  Today she denies SI/HI/VH, mania, or paranoia.  Patient informed provider that her menstrual cycles are irregular.  She notes that she and her husband are trying to get pregnant.  Provider asked patient if she had following up with her OB/GYN and she notes that at this time she does not.  Provider encouraged patient to follow-up with her OB/GYN.  She endorsed understanding and agreed.  Patient also informed provider that she is trying to receive housing through the [employer].   No medication changes made today.  Patient agreeable to continue medication prescribed. Visit Diagnosis:    ICD-10-CM   1. Schizoaffective disorder, bipolar type (HCC)  F25.0 OLANZapine (ZYPREXA) 15 MG tablet    Past Psychiatric History: Bipolar 1 disorder, schizoaffective disorder bipolar type, and  psychosis. Has had several psychiatric hospitalizations in the past.  Has been hospitalized at [facility], [place] in [place] and others.  Used to receive IM Abilify Maintena 400 mg injections between 2018 and 2020.  Past Medical History:  Past Medical History:  Diagnosis Date  . Bipolar 1 disorder (HCC)   . Depression   . 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 Schizophrenia (HCC)    History reviewed. No pertinent surgical history.  Family Psychiatric History: Denies  Family History:  Family History  Problem Relation Age of Onset  . Hypertension Father     Social History:  Social History   Socioeconomic History  . Marital status: Married    Spouse name: Amadou  . Number of children: Not on file  . Years of education: Not on file  . Highest education level: Associate degree: occupational, Scientist, product/process development, or vocational program  Occupational History  . Occupation: unemployed  Tobacco Use  . Smoking status: Never Smoker  . Smokeless tobacco: Never Used  Vaping Use  . Vaping Use: Never used  Substance and Sexual Activity  . Alcohol use: No    Alcohol/week: 0.0 standard drinks  . Drug use: No  . Sexual activity: Yes    Birth control/protection: None  Other Topics Concern  . Not on file  Social History Narrative   ** Merged History Encounter **       Social Determinants of Health   Financial Resource Strain: Not on file  Food Insecurity: Not on file  Transportation Needs: Not on file  Physical Activity: Not on file  Stress: Not on file  Social Connections: Not on file    Allergies:  Allergies  Allergen Reactions  . Peanut-Containing Drug Products Nausea And Vomiting  . Pork-Derived Products Other (See Comments) and Rash    Religious purposes Religious purposes    Metabolic Disorder Labs: Lab Results  Component Value Date   HGBA1C 5.5 10/23/2018   MPG 111.15 10/23/2018   MPG 105 08/09/2016   No results found for:  PROLACTIN Lab Results  Component Value Date   CHOL 176 10/23/2018   TRIG 50 10/23/2018   HDL 71 10/23/2018   CHOLHDL 2.5 10/23/2018   VLDL 10 10/23/2018   LDLCALC 95 10/23/2018   LDLCALC 71 08/09/2016   Lab Results  Component Value Date   TSH 0.478 02/23/2020   TSH 0.278 (L) 10/23/2018    Therapeutic Level Labs: No results found for: LITHIUM Lab Results  Component Value Date   VALPROATE 127 (H) 09/07/2016   VALPROATE 123 (H) 08/16/2016   No components found for:  CBMZ  Current Medications: Current Outpatient Medications  Medication Sig Dispense Refill  . letrozole (FEMARA) 2.5 MG tablet Take 1 tablet (2.5 mg total) by mouth daily. Take on days 3 to 7 following a spontaneous menses or progestin-induced bleed. 5 tablet 2  . OLANZapine (ZYPREXA) 15 MG tablet Take 1 tablet (15 mg total) by mouth at bedtime. 30 tablet 2   No current facility-administered medications for this visit.     Musculoskeletal: Strength & Muscle Tone: within normal limits Gait & Station: normal Patient leans: N/A  Psychiatric Specialty Exam: Review of Systems  There were no vitals taken for this visit.There is no height or weight on file to calculate BMI.  General Appearance: Well Groomed  Eye Contact:  Good  Speech:  Clear and Coherent and Normal Rate  Volume:  Normal  Mood:  Euthymic  Affect:  Appropriate and Congruent  Thought Process:  Coherent, Goal Directed and Linear  Orientation:  Full (Time, Place, and Person)  Thought Content: WDL and Logical   Suicidal Thoughts:  No  Homicidal Thoughts:  No  Memory:  Immediate;   Good Recent;   Good Remote;   Good  Judgement:  Good  Insight:  Good  Psychomotor Activity:  Normal  Concentration:  Concentration: Good and Attention Span: Good  Recall:  Good  Fund of Knowledge: Good  Language: Good  Akathisia:  No  Handed:  Right  AIMS (if indicated): Not done  Assets:  Communication Skills Desire for Improvement Financial  Resources/Insurance Housing Physical Health Social Support  ADL's:  Intact  Cognition: WNL  Sleep:  Good   Screenings: AIMS   Flowsheet Row [facility] Admission (Discharged) from 10/23/2018 in [facility]  AIMS Total Score 0 0    AUDIT   Flowsheet Row Ad[facility]) [facility] Admission (Discharged) from 08/08/2016 in [facility]ICINE  Alcohol Use Disorder Identification Test Final Score (AUDIT) 0 0    GAD-7   Flowsheet Row Clinical Support from 11/08/2020 in [facility]  Total GAD-7 Score 0    PHQ2-9   Flowsheet Row Clinical Support from 11/08/2020 in [facility] Office Visit from 09/21/2014 in Primary Care at [facility] Total Score 0 1  PHQ-9 Total Score 2 --    Flowsheet Row Ad[facility] (Dis[facility]  C-SSRS RISK CATEGORY No Risk       Assessment and Plan: Patient notes that she is doing well on her current medication regimen.  No medication changes made today.  Patient agreeable to continue medication as prescribed.  1. Schizoaffective disorder, bipolar type (HCC)  Continue- OLANZapine (ZYPREXA) 15 MG tablet; Take 1 tablet (15 mg total) by mouth at bedtime.  Dispense: 30 tablet; Refill: 2  Follow-up in 3 months  Shanna Cisco, NP 11/08/2020, 3:54 PM

## 2020-11-16 ENCOUNTER — Encounter: Payer: Self-pay | Admitting: Women's Health

## 2020-11-16 ENCOUNTER — Ambulatory Visit (INDEPENDENT_AMBULATORY_CARE_PROVIDER_SITE_OTHER): Payer: Medicaid Other | Admitting: Women's Health

## 2020-11-16 ENCOUNTER — Other Ambulatory Visit: Payer: Self-pay

## 2020-11-16 VITALS — BP 119/81 | HR 108 | Wt 257.8 lb

## 2020-11-16 DIAGNOSIS — N898 Other specified noninflammatory disorders of vagina: Secondary | ICD-10-CM

## 2020-11-16 NOTE — Progress Notes (Signed)
Pt is in the office reporting vaginal bumps, denies pain, itching.  States that bumps have been in vaginal area for over a month.

## 2020-11-16 NOTE — Progress Notes (Signed)
  History:  Barbara George is a 25 y.o. G0P0000 who presents to clinic today for vaginal bump. The bump appeared about one month ago and is present constantly and intermittently gets larger and smaller. Patient denies bleeding, pain, abnormal discharge or constitutional symptoms. Patient does endorse shaving her pelvic area.  The following portions of the patient's history were reviewed and updated as appropriate: allergies, current medications, family history, past medical history, social history, past surgical history and problem list.  Review of Systems:  Review of Systems  Genitourinary:        Vaginal bump.     Objective:  Physical Exam BP 119/81   Pulse (!) 108   Wt 257 lb 12.8 oz (116.9 kg)   LMP 10/25/2020   BMI 39.20 kg/m   Physical Exam Vitals reviewed. Exam conducted with a chaperone present.  Constitutional:      General: She is not in acute distress.    Appearance: Normal appearance. She is not ill-appearing, toxic-appearing or diaphoretic.  HENT:     Head: Normocephalic and atraumatic.  Pulmonary:     Effort: Pulmonary effort is normal.  Genitourinary:    General: Normal vulva.     Pubic Area: No rash or pubic lice.      Labia:        Right: No rash, tenderness or lesion.        Left: No rash, tenderness or lesion.        Comments: Lesion wart-like in appearance, but clear/pink color. Skin:    General: Skin is warm and dry.  Neurological:     Mental Status: She is alert and oriented to person, place, and time.  Psychiatric:        Mood and Affect: Mood normal.        Behavior: Behavior normal.        Thought Content: Thought content normal.        Judgment: Judgment normal.   Labs and Imaging No results found for this or any previous visit (from the past 24 hour(s)).  No results found.  Health Maintenance Due  Topic Date Due   COVID-19 Vaccine (1) Never done   HPV VACCINES (1 - 2-dose series) Never done   HIV Screening  Never done    Hepatitis C Screening  Never done   PAP-Cervical Cytology Screening  Never done   PAP SMEAR-Modifier  Never done   Assessment & Plan:  1. Vaginal lesion -pt to MD for further evaluation  Approximately 7 minutes of total time was spent with this patient on history, exam, counseling.  Marylen Ponto, NP 11/16/2020 1:27 PM

## 2020-12-07 ENCOUNTER — Encounter: Payer: Self-pay | Admitting: Obstetrics and Gynecology

## 2020-12-07 ENCOUNTER — Ambulatory Visit (INDEPENDENT_AMBULATORY_CARE_PROVIDER_SITE_OTHER): Payer: Medicaid Other | Admitting: Obstetrics and Gynecology

## 2020-12-07 ENCOUNTER — Other Ambulatory Visit: Payer: Self-pay

## 2020-12-07 VITALS — BP 119/82 | HR 92 | Wt 260.0 lb

## 2020-12-07 DIAGNOSIS — Z3169 Encounter for other general counseling and advice on procreation: Secondary | ICD-10-CM | POA: Diagnosis not present

## 2020-12-07 NOTE — Progress Notes (Signed)
25 yo here to follow up on vaginal lesion and infertility. Patient denies any vulva/perineal discomfort since her last visit. Her husband recently had a semen analysis and patient desires an explanation. Patient is without any other complaints  Past Medical History:  Diagnosis Date   Bipolar 1 disorder (HCC)    Depression    Diabetes mellitus without complication (HCC)    Type 2 (Pre)   Schizophrenia (HCC)    No past surgical history on file. Family History  Problem Relation Age of Onset   Hypertension Father    Social History   Tobacco Use   Smoking status: Never   Smokeless tobacco: Never  Vaping Use   Vaping Use: Never used  Substance Use Topics   Alcohol use: No    Alcohol/week: 0.0 standard drinks   Drug use: No   ROS See pertinent in HPI. All other systems reviewed and non contributory] Blood pressure 119/82, pulse 92, weight 260 lb (117.9 kg), last menstrual period 11/30/2020.  GENERAL: Well-developed, well-nourished female in no acute distress.  PELVIC: Normal external female genitalia. Vagina is pink and rugated.  Normal discharge. Normal appearing cervix. Uterus is normal in size.  No adnexal mass or tenderness. EXTREMITIES: No cyanosis, clubbing, or edema, 2+ distal pulses.  A/P 25 yo here for follow up - Reassurance provided on vaginal mucosa as no lesions were visualized today - Reviewed semen analysis and recommendations from Dr. April Manson. Informed patient that 4 vitamin supplements(Vitamin A, E, zinc and folic acid) were recommended to be taken daily along with a urology referal secondary to abnormal semen analysis. Patient verbalized understanding and all questions were answered - Patient understands that she will not be able to achieve a pregnancy without improvement in the semen analysis - RTC prn - Patient declined STI testing - Patient reports a normal pap smear with PCP in the past 12 months

## 2020-12-07 NOTE — Progress Notes (Signed)
RGYN pt here to F/U on vaginal lesion pt denies any pain or discomfort at this time.   Needs to discuss SA results scanned in Media  Pt needs to discuss recent cycle.and weight gain.

## 2021-02-07 ENCOUNTER — Encounter (HOSPITAL_COMMUNITY): Payer: Self-pay | Admitting: Psychiatry

## 2021-02-07 ENCOUNTER — Ambulatory Visit (INDEPENDENT_AMBULATORY_CARE_PROVIDER_SITE_OTHER): Payer: Medicaid Other | Admitting: Psychiatry

## 2021-02-07 ENCOUNTER — Other Ambulatory Visit: Payer: Self-pay

## 2021-02-07 DIAGNOSIS — F250 Schizoaffective disorder, bipolar type: Secondary | ICD-10-CM

## 2021-02-07 MED ORDER — OLANZAPINE 15 MG PO TABS: 15.0000 mg | ORAL_TABLET | Freq: Every day | ORAL | 3 refills | Status: DC

## 2021-02-07 NOTE — Progress Notes (Signed)
BH MD/PA/NP OP Progress Note  02/07/2021 4:01 PM Barbara George  MRN:  [id number]  Chief Complaint: " I'm okay but Im gaining weight" Chief Complaint   Medication Management     HPI: A 25 year old female seen today for follow-up psychiatric evaluation. She has a psychiatric history of bipolar 1 disorder, schizoaffective disorder bipolar type since, and psychosis.  She is currently managed on Zyprexa 15 mg nightly.  Today she notes her medications are effective in managing her psychiatric condition.  Today she is well-groomed, pleasant, cooperative, and engaged in conversation.  She informed provider that overall she is doing well.  She however informed Clinical research associate that she is concerned about her weight gain.  She notes that she likes Zyprexa however her appetite has increased and so is her weight.  Provider informed patient of Claretha Cooper and how it is equivalent to Zyprexa however it has Samidorphan in it which reduces the potential for decreases weight gain.  Provider discussed risks, side effects, and benefits and encouraged patient to do her research.  She notes that she would and would consider switching to Lybalvi at her next visit.   Patient informed Clinical research associate that she continues to have minimal anxiety and depression.  Provider conducted a GAD-7 and patient scored a 1, at her last visit she scored a 0.  Provider also conducted a PHQ-9 of P scored a 4, her last visit she scored a 2.  She endorses hypersomnia noting that she sleeps 10 to 14 hours daily.   Today she denies SI/HI/VAH, mania, or paranoia.  Patient informed provider that she visited her OB/GYN to assess her menstrual cycle irregularities.  She notes that she and her husband are still trying to conceive and her OB/GYN notes that everything was within normal limits.   Patient also informed Clinical research associate that she is trying to receive housing through [facility] [employer] [facility].  She informed Clinical research associate that they informed her that they may have availabilities  in October.  She notes that she is hopeful that they will be able to accommodate her.    No medication changes made today.  Patient agreeable to continue medication prescribed.  She will research Lybalvi and consider switching to this regimen at her next visit.  No other concerns noted at this time. Visit Diagnosis:    ICD-10-CM   1. Schizoaffective disorder, bipolar type (HCC)  F25.0 OLANZapine (ZYPREXA) 15 MG tablet       Past Psychiatric History: Bipolar 1 disorder, schizoaffective disorder bipolar type, and psychosis. Has had several psychiatric hospitalizations in the past.  Has been hospitalized at [facility], [facility] in [place] and others.  Used to receive IM Abilify Maintena 400 mg injections between 2018 and 2020.  Past Medical History:  Past Medical History:  Diagnosis Date   Bipolar 1 disorder (HCC)    Depression    Diabetes mellitus without complication (HCC)    Type 2 (Pre)   Schizophrenia (HCC)    No past surgical history on file.  Family Psychiatric History: Denies  Family History:  Family History  Problem Relation Age of Onset   Hypertension Father     Social History:  Social History   Socioeconomic History   Marital status: Married    Spouse name: Amadou   Number of children: Not on file   Years of education: Not on file   Highest education level: Associate degree: occupational, Scientist, product/process development, or vocational program  Occupational History   Occupation: unemployed  Tobacco Use   Smoking status: Never  Smokeless tobacco: Never  Vaping Use   Vaping Use: Never used  Substance and Sexual Activity   Alcohol use: No    Alcohol/week: 0.0 standard drinks   Drug use: No   Sexual activity: Yes    Birth control/protection: None  Other Topics Concern   Not on file  Social History Narrative   ** Merged History Encounter **       Social Determinants of Health   Financial Resource Strain: Not on file  Food Insecurity: Not on file  Transportation  Needs: Not on file  Physical Activity: Not on file  Stress: Not on file  Social Connections: Not on file    Allergies:  Allergies  Allergen Reactions   Peanut-Containing Drug Products Nausea And Vomiting   Pork-Derived Products Other (See Comments) and Rash    Religious purposes Religious purposes    Metabolic Disorder Labs: Lab Results  Component Value Date   HGBA1C 5.5 10/23/2018   MPG 111.15 10/23/2018   MPG 105 08/09/2016   No results found for: PROLACTIN Lab Results  Component Value Date   CHOL 176 10/23/2018   TRIG 50 10/23/2018   HDL 71 10/23/2018   CHOLHDL 2.5 10/23/2018   VLDL 10 10/23/2018   LDLCALC 95 10/23/2018   LDLCALC 71 08/09/2016   Lab Results  Component Value Date   TSH 0.478 02/23/2020   TSH 0.278 (L) 10/23/2018    Therapeutic Level Labs: No results found for: LITHIUM Lab Results  Component Value Date   VALPROATE 127 (H) 09/07/2016   VALPROATE 123 (H) 08/16/2016   No components found for:  CBMZ  Current Medications: Current Outpatient Medications  Medication Sig Dispense Refill   letrozole (FEMARA) 2.5 MG tablet Take 1 tablet (2.5 mg total) by mouth daily. Take on days 3 to 7 following a spontaneous menses or progestin-induced bleed. (Patient not taking: Reported on 11/16/2020) 5 tablet 2   OLANZapine (ZYPREXA) 15 MG tablet Take 1 tablet (15 mg total) by mouth at bedtime. 30 tablet 3   No current facility-administered medications for this visit.     Musculoskeletal: Strength & Muscle Tone: within normal limits Gait & Station: normal Patient leans: N/A  Psychiatric Specialty Exam: Review of Systems  Blood pressure 129/85, pulse 86, height 5\' 8"  (1.727 m), weight 263 lb (119.3 kg), SpO2 100 %.Body mass index is 39.99 kg/m.  General Appearance: Well Groomed  Eye Contact:  Good  Speech:  Clear and Coherent and Normal Rate  Volume:  Normal  Mood:  Euthymic  Affect:  Appropriate and Congruent  Thought Process:  Coherent, Goal  Directed and Linear  Orientation:  Full (Time, Place, and Person)  Thought Content: WDL and Logical   Suicidal Thoughts:  No  Homicidal Thoughts:  No  Memory:  Immediate;   Good Recent;   Good Remote;   Good  Judgement:  Good  Insight:  Good  Psychomotor Activity:  Normal  Concentration:  Concentration: Good and Attention Span: Good  Recall:  Good  Fund of Knowledge: Good  Language: Good  Akathisia:  No  Handed:  Right  AIMS (if indicated): Not done  Assets:  Communication Skills Desire for Improvement Financial Resources/Insurance Housing Physical Health Social Support  ADL's:  Intact  Cognition: WNL  Sleep:  Fair   Screenings: AIMS    Flowsheet Row [facility] Admission (Discharged) from 10/23/2018 in [facility]  AIMS Total Score 0 0  AUDIT    Flowsheet Row [facility] Admission (Discharged) from 08/08/2016 in [facility]  Alcohol Use Disorder Identification Test Final Score (AUDIT) 0 0      GAD-7    Flowsheet Row Clinical Support from 02/07/2021 in [facility] Clinical Support from 11/08/2020 in [facility]  Total GAD-7 Score 1 0      PHQ2-9    Flowsheet Row Clinical Support from 02/07/2021 in [facility] Clinical Support from 11/08/2020 in [facility] Office Visit from 09/21/2014 in [facility] at [facility] Total Score 0 0 1  PHQ-9 Total Score 4 2 --      Flowsheet Row [facility]  C-SSRS RISK CATEGORY No Risk        Assessment and Plan: Patient notes that she is doing well on her current medication regimen however is concerned about her weight gain.  No medication  changes made today.  Patient agreeable to continue medication prescribed.  She will research Lybalvi and consider switching to this regimen at her next visit.  1. Schizoaffective disorder, bipolar type (HCC)  Continue- OLANZapine (ZYPREXA) 15 MG tablet; Take 1 tablet (15 mg total) by mouth at bedtime.  Dispense: 30 tablet; Refill: 2  Follow-up in 3 months  Shanna Cisco, NP 02/07/2021, 4:01 PM

## 2021-03-16 ENCOUNTER — Ambulatory Visit: Payer: Medicaid Other

## 2021-03-16 ENCOUNTER — Ambulatory Visit (INDEPENDENT_AMBULATORY_CARE_PROVIDER_SITE_OTHER): Payer: Medicaid Other

## 2021-03-16 ENCOUNTER — Other Ambulatory Visit: Payer: Self-pay

## 2021-03-16 ENCOUNTER — Other Ambulatory Visit (HOSPITAL_COMMUNITY)
Admission: RE | Admit: 2021-03-16 | Discharge: 2021-03-16 | Disposition: A | Discharge status: Home / Self Care | Payer: Medicaid Other | Source: Ambulatory Visit

## 2021-03-16 VITALS — BP 130/83 | HR 100 | Ht 68.0 in | Wt 264.0 lb

## 2021-03-16 DIAGNOSIS — Z124 Encounter for screening for malignant neoplasm of cervix: Secondary | ICD-10-CM

## 2021-03-16 DIAGNOSIS — N938 Other specified abnormal uterine and vaginal bleeding: Secondary | ICD-10-CM | POA: Diagnosis not present

## 2021-03-16 DIAGNOSIS — N760 Acute vaginitis: Secondary | ICD-10-CM

## 2021-03-16 DIAGNOSIS — B9689 Other specified bacterial agents as the cause of diseases classified elsewhere: Secondary | ICD-10-CM | POA: Diagnosis not present

## 2021-03-16 NOTE — Progress Notes (Signed)
GYN presents for vaginal bleeding x 2+ months, she is not using BC.  C/o discharge, odor.  Denies itching, pelvic pain, dysuria.

## 2021-03-16 NOTE — Progress Notes (Signed)
GYNECOLOGY PROBLEM OFFICE VISIT NOTE  History:  Barbara George is a 25 y.o. G0P0000 here today for vaginal bleeding. She states she started bleeding August 3 and stopped "in the 20s." She states it started again Sept 27th and she only sees it with wiping.  She states it has been intermittent and when she "thinks it is done it will continue." She reports the spotting ranges from pink to red to brown. She denies pain/cramping or passing of large clots. She is sexually active and reports noting blood. She is not currently on birth control and is desiring conception.   Past Medical History:  Diagnosis Date  . Bipolar 1 disorder (HCC)   . Depression   . 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 Schizophrenia (HCC)     No past surgical history on file.  The following portions of the patient's history were reviewed and updated as appropriate: allergies, current medications, past family history, past medical history, past social history, past surgical history and problem list.   Health Maintenance:  Pap collected and pending.  No mammogram d/t age.   Review of Systems:  Genito-Urinary ROS: negative Gastrointestinal ROS: no abdominal pain, change in bowel habits, or black or bloody stools Objective:  Vitals: BP 130/83 (BP Location: Left Arm, Cuff Size: Large)   Pulse 100   Ht 5\' 8"  (1.727 m)   Wt 264 lb (119.7 kg)   LMP 02/28/2021 (Exact Date)   BMI 40.14 kg/m   Physical Exam: Physical Exam Constitutional:      Appearance: Normal appearance. She is obese.  Genitourinary:     Genitourinary Comments: Pap collected with brush and spatula. CV collected from vault     Right Labia: No tenderness or lesions.    Left Labia: No tenderness or lesions.    Vaginal discharge (Scant amt thin white discharge.) present.     No vaginal bleeding.     No cervical discharge, friability, lesion, polyp or nabothian cyst.     Uterus is not enlarged.     No urethral  tenderness present.  HENT:     Head: Normocephalic and atraumatic.  Eyes:     Conjunctiva/sclera: Conjunctivae normal.  Cardiovascular:     Rate and Rhythm: Normal rate and regular rhythm.     Heart sounds: Normal heart sounds.  Pulmonary:     Effort: Pulmonary effort is normal. No respiratory distress.     Breath sounds: Normal breath sounds.  Musculoskeletal:        General: Normal range of motion.     Cervical back: Normal range of motion.  Neurological:     Mental Status: She is alert and oriented to person, place, and time.  Skin:    General: Skin is warm and dry.  Psychiatric:        Mood and Affect: Mood normal.        Behavior: Behavior normal.        Thought Content: Thought content normal.  Vitals reviewed. Exam conducted with a chaperone present.     Labs and Imaging: No results found.  Assessment & Plan:  25 year old Dysfunctional Uterine Bleeding Pap Smear   -Discussed some potential causes for abnormal bleeding including but not limited to infection or cervical friability. -Reviewed and recommended STD testing and patient agreeable. -Also discussed completion of pap smear and patient agreeable. -Informed that provider will contact with results. -Further encouraged to start to track period for proper dating.  -  Patient reveals her husband is cause of infertility d/t sperm motility issues.  -Informed that if testing returns negative will consider pelvic US to reassess uterine structure and endometrial lining.  -Patient without questions or concerns r/t POC. -RTO prn.    Total face-to-face time with patient: 15 minutes   Gerrit Heck, [place] 03/16/2021 4:47 PM

## 2021-03-20 LAB — CERVICOVAGINAL ANCILLARY ONLY
Bacterial Vaginitis (gardnerella): POSITIVE — AB
Candida Glabrata: POSITIVE — AB
Candida Vaginitis: NEGATIVE
Chlamydia: NEGATIVE
Comment: NEGATIVE
Comment: NEGATIVE
Comment: NEGATIVE
Comment: NEGATIVE
Comment: NEGATIVE
Comment: NORMAL
Neisseria Gonorrhea: NEGATIVE
Trichomonas: NEGATIVE

## 2021-03-22 LAB — CYTOLOGY - PAP: Diagnosis: NEGATIVE

## 2021-03-25 MED ORDER — METRONIDAZOLE 0.75 % VA GEL: 1.0000 | Freq: Every day | VAGINAL | 0 refills | Status: DC

## 2021-03-25 NOTE — Addendum Note (Signed)
Addended by: Cherre Robins on: 03/25/2021 08:58 PM   Modules accepted: Orders

## 2021-05-03 DIAGNOSIS — E119 Type 2 diabetes mellitus without complications: Secondary | ICD-10-CM | POA: Insufficient documentation

## 2021-05-03 DIAGNOSIS — E785 Hyperlipidemia, unspecified: Secondary | ICD-10-CM | POA: Insufficient documentation

## 2021-05-15 ENCOUNTER — Other Ambulatory Visit (HOSPITAL_COMMUNITY): Payer: Self-pay | Admitting: Psychiatry

## 2021-05-15 ENCOUNTER — Telehealth (HOSPITAL_COMMUNITY): Payer: Medicaid Other | Admitting: Psychiatry

## 2021-05-15 DIAGNOSIS — F250 Schizoaffective disorder, bipolar type: Secondary | ICD-10-CM

## 2021-05-15 MED ORDER — OLANZAPINE 15 MG PO TABS: 15.0000 mg | ORAL_TABLET | Freq: Every day | ORAL | 3 refills | Status: DC

## 2021-06-06 ENCOUNTER — Other Ambulatory Visit (HOSPITAL_COMMUNITY)
Admission: RE | Admit: 2021-06-06 | Discharge: 2021-06-06 | Disposition: A | Discharge status: Home / Self Care | Payer: Medicaid Other | Source: Ambulatory Visit | Attending: Nurse Practitioner | Admitting: Nurse Practitioner

## 2021-06-06 ENCOUNTER — Ambulatory Visit (INDEPENDENT_AMBULATORY_CARE_PROVIDER_SITE_OTHER): Payer: Medicaid Other | Admitting: Nurse Practitioner

## 2021-06-06 ENCOUNTER — Other Ambulatory Visit: Payer: Self-pay

## 2021-06-06 ENCOUNTER — Encounter: Payer: Self-pay | Admitting: Nurse Practitioner

## 2021-06-06 VITALS — BP 140/88 | HR 94 | Wt 260.4 lb

## 2021-06-06 DIAGNOSIS — N938 Other specified abnormal uterine and vaginal bleeding: Secondary | ICD-10-CM | POA: Insufficient documentation

## 2021-06-06 NOTE — Progress Notes (Signed)
GYNECOLOGY OFFICE VISIT NOTE   History:  26 y.o. G0P0000 here today for extended vaginal bleeding for 20-25 days for 2 months.  Today she is not bleeding.  She has a PCP she sees for DM Type 2 and  a psych provider she sees for schizophrenia and bipolar disorder.  She is on Zyprexa.  She has seen Dr. Jolayne Panther before as she has not been able to become pregnant.  She and her husband were evaluated by Dr. April Manson.  Her husband is seeing a urologist soon for treatment for low sperm count.  She denies any abnormal vaginal discharge, bleeding, pelvic pain or other concerns.   Past Medical History:  Diagnosis Date   Bipolar 1 disorder (HCC)    Depression    Diabetes mellitus without complication (HCC)    Type 2 (Pre)   Schizophrenia (HCC)     History reviewed. No pertinent surgical history.  The following portions of the patient's history were reviewed and updated as appropriate: allergies, current medications, past family history, past medical history, past social history, past surgical history and problem list.   Health Maintenance:  Normal pap on 03-16-21.     Review of Systems:  Pertinent items noted in HPI and remainder of comprehensive ROS otherwise negative.  Objective:  Physical Exam BP 140/88    Pulse 94    Wt 260 lb 6.4 oz (118.1 kg)    BMI 39.59 kg/m  CONSTITUTIONAL: Well-developed, well-nourished female in no acute distress.  HENT:  Normocephalic, atraumatic. External right and left ear normal.  EYES: Conjunctivae and EOM are normal. Pupils are equal, round.  No scleral icterus.  NECK: Normal range of motion, supple, no masses SKIN: Skin is warm and dry. No rash noted. Not diaphoretic. No erythema. No pallor. NEUROLOGIC: Alert and oriented to person, place, and time. Normal muscle tone coordination. No cranial nerve deficit noted. PSYCHIATRIC: Normal mood and affect. Normal behavior. Normal judgment and thought content. CARDIOVASCULAR: Normal heart rate  noted RESPIRATORY: Effort and breath sounds normal, no problems with respiration noted ABDOMEN: Soft, no distention noted.   PELVIC:  vaginal swab done MUSCULOSKELETAL: Normal range of motion. No edema noted.  Labs and Imaging No results found.  Assessment & Plan:  1. Dysfunctional uterine bleeding Bleeding for 20-25 days for 2 months.  The last time this happened was one year ago in January 2022. Made the appointment to come for the bleeding but by the time of her appointment today, bleeding has stopped She and her partner want to have a pregnancy He is following up with urology tomorrow for a low sperm count. She is unsure what to do - she has taken Femara before but stopped taking it. She does not really want to take medication to regulate her bleeding as she still wants to become pregnant Discussed case with Dr. Shawnie Pons Will get ultrasound due to extended vaginal bleeding and did swab for infection today Will follow up with MD again after [place] to review plan of care. Additionally spoke with patient about seeing PCP and discussing BP and saying she is wanting to be pregnant - BP elevated today and she may need medication for BP Her PCP has made an appointment for her to see nutritionist to help her manage her Type 2 DM.  - Cervicovaginal ancillary only - US PELVIC COMPLETE WITH TRANSVAGINAL; Future   Routine preventative health maintenance measures emphasized. Please refer to After Visit Summary for other counseling recommendations.   Return for to see MD  in the office - MD only - after ultrasound is done.   Total face-to-face time with patient: 15 minutes.  Over 50% of encounter was spent on counseling and coordination of care.  Nolene Bernheim, RN, MSN, NP-BC Nurse Practitioner, [facility] for [employer], [facility] 06/06/2021 4:59 PM

## 2021-06-07 LAB — CERVICOVAGINAL ANCILLARY ONLY
Bacterial Vaginitis (gardnerella): POSITIVE — AB
Candida Glabrata: NEGATIVE
Candida Vaginitis: POSITIVE — AB
Chlamydia: NEGATIVE
Comment: NEGATIVE
Comment: NEGATIVE
Comment: NEGATIVE
Comment: NEGATIVE
Comment: NEGATIVE
Comment: NORMAL
Neisseria Gonorrhea: NEGATIVE
Trichomonas: NEGATIVE

## 2021-06-08 MED ORDER — FLUCONAZOLE 150 MG PO TABS: ORAL_TABLET | ORAL | 0 refills | Status: DC

## 2021-06-08 MED ORDER — METRONIDAZOLE 500 MG PO TABS: 500.0000 mg | ORAL_TABLET | Freq: Two times a day (BID) | ORAL | 0 refills | Status: DC

## 2021-06-08 NOTE — Addendum Note (Signed)
Addended by: Currie Paris on: 06/08/2021 09:32 AM   Modules accepted: Orders

## 2021-06-13 ENCOUNTER — Ambulatory Visit: Admission: RE | Admit: 2021-06-13 | Payer: Medicaid Other | Source: Ambulatory Visit

## 2021-06-14 ENCOUNTER — Ambulatory Visit
Admission: RE | Admit: 2021-06-14 | Discharge: 2021-06-14 | Disposition: A | Discharge status: Home / Self Care | Payer: Medicaid Other | Source: Ambulatory Visit | Attending: Nurse Practitioner | Admitting: Nurse Practitioner

## 2021-06-14 ENCOUNTER — Other Ambulatory Visit: Payer: Self-pay

## 2021-06-14 DIAGNOSIS — N938 Other specified abnormal uterine and vaginal bleeding: Secondary | ICD-10-CM | POA: Diagnosis not present

## 2021-06-26 ENCOUNTER — Other Ambulatory Visit: Payer: Self-pay

## 2021-06-26 ENCOUNTER — Encounter: Payer: Self-pay | Admitting: Obstetrics and Gynecology

## 2021-06-26 ENCOUNTER — Ambulatory Visit (INDEPENDENT_AMBULATORY_CARE_PROVIDER_SITE_OTHER): Payer: Medicaid Other | Admitting: Obstetrics and Gynecology

## 2021-06-26 VITALS — BP 127/87 | HR 102 | Wt 256.0 lb

## 2021-06-26 DIAGNOSIS — Z712 Person consulting for explanation of examination or test findings: Secondary | ICD-10-CM

## 2021-06-26 NOTE — Progress Notes (Signed)
26 yo P0 here to discuss ultrasound results. Patient reports doing well without any further episodes of menorrhagia. She denies pelvic pain or abnormal discharge. Her husband is awaiting follow up with urologist in regards to female infertility. Patient has been diagnosed with diabetes and plans to follow up with a nutritionist next month. Patient is otherwise without any new complaints  Past Medical History:  Diagnosis Date   Bipolar 1 disorder (HCC)    Depression    Diabetes mellitus without complication (HCC)    Type 2 (Pre)   Schizophrenia (HCC)    No past surgical history on file. Family History  Problem Relation Age of Onset   Hypertension Father    Social History   Tobacco Use   Smoking status: Never   Smokeless tobacco: Never  Vaping Use   Vaping Use: Never used  Substance Use Topics   Alcohol use: No    Alcohol/week: 0.0 standard drinks   Drug use: No   ROS See pertinent in HPI. All other systems reviewed and non contributory Blood pressure 127/87, pulse (!) 102, weight 256 lb (116.1 kg).  GENERAL: Well-developed, well-nourished female in no acute distress.  NEURO: alert and oriented x 3  US PELVIC COMPLETE WITH TRANSVAGINAL  Result Date: 06/14/2021 CLINICAL DATA:  Extended vaginal bleeding, menses lasting 20-25 days during the last 2 months LMP 04/28/2021 EXAM: TRANSABDOMINAL AND TRANSVAGINAL ULTRASOUND OF PELVIS TECHNIQUE: Both transabdominal and transvaginal ultrasound examinations of the pelvis were performed. Transabdominal technique was performed for global imaging of the pelvis including uterus, ovaries, adnexal regions, and pelvic cul-de-sac. It was necessary to proceed with endovaginal exam following the transabdominal exam to visualize the uterus, endometrium, and ovaries. COMPARISON:  08/05/2019 FINDINGS: Uterus Measurements: 8.6 x 4.1 x 4.2 cm = volume: 78 mL. Anteverted. Normal morphology without mass Endometrium Thickness: 8 mm.  No endometrial fluid or mass  Right ovary Measurements: 2.9 x 1.7 x 2.6 cm = volume: 6.4 mL. Normal morphology without mass Left ovary Measurements: 4.0 x 2.3 x 2.2 cm = volume: 10.4 mL. Normal morphology without mass Other findings Trace nonspecific free pelvic fluid.  No adnexal masses. IMPRESSION: No pelvic sonographic abnormalities identified. Electronically Signed   By: Ulyses Southward M.D.   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

## 2021-07-13 ENCOUNTER — Telehealth (HOSPITAL_COMMUNITY): Payer: Self-pay | Admitting: Psychiatry

## 2021-07-13 ENCOUNTER — Encounter (HOSPITAL_COMMUNITY): Payer: Self-pay | Admitting: Psychiatry

## 2021-07-13 NOTE — Telephone Encounter (Signed)
Patient called the office to requesting a letter showing proof of her illness and diagnosis. Writer informed message will be given to provider & responded to at earliest convenience.

## 2021-07-13 NOTE — Telephone Encounter (Signed)
Letter written and placed in my chart.

## 2021-07-24 ENCOUNTER — Other Ambulatory Visit: Payer: Self-pay

## 2021-07-24 ENCOUNTER — Encounter: Payer: Medicaid Other | Attending: Nurse Practitioner | Admitting: Dietician

## 2021-07-24 DIAGNOSIS — E119 Type 2 diabetes mellitus without complications: Secondary | ICD-10-CM

## 2021-07-24 DIAGNOSIS — E1165 Type 2 diabetes mellitus with hyperglycemia: Secondary | ICD-10-CM | POA: Diagnosis present

## 2021-07-24 NOTE — Progress Notes (Signed)
Diabetes Self-Management Education  Visit Type: First/Initial  Appt. Start Time: 1510 Appt. End Time: 1635  07/24/2021  [name], identified by name and date of birth, is a 26 y.o. female with a diagnosis of Diabetes: Type 2.   ASSESSMENT Patient is here today alone.  She states that she gained increased weight since coming to the [place].   Started on Zyprexa and stated that she gained weight on this but also was not exercising and stayed at home all day doing nothing.    History includes Type 2 Diabetes (2022), hyperinsulinemia, bipolar depression Medications include Metformin, Zyprexa She states that the Zyprexa makes her sleep too much in the day and is often up until 2 am. A1C 6.7% and insulin 294 on 04/10/2021   She has not been testing her blood glucose Provided an Accu Chek Guide Me Lot [id number], Expiration 07/23/2022 Glucose 120 in office.  Patient was able to demonstrate use.  Weight hx: 262 lbs 07/24/2021 Lost 7 lbs recently but regained. 263 lbs is patient's highest weight  Patient lives with her husband.  They shop together and patient cooks.  She is currently trying to conceive. She plans on returning to college and is bored at home. She is from [place] and has been here since 2014. She is not exercising.   She eats a typical [place] which has increased amounts of rice. No pork for religious reasons. Allergic to peanuts  Height 5\' 8"  (1.727 m), weight 261 lb (118.4 kg). Body mass index is 39.68 kg/m.   Diabetes Self-Management Education - 07/24/21 1527       Visit Information   Visit Type First/Initial      Initial Visit   Diabetes Type Type 2    Are you currently following a meal plan? No    Are you taking your medications as prescribed? Yes    Date Diagnosed 04/2021      Health Coping   How would you rate your overall health? Fair      Psychosocial Assessment   Patient Belief/Attitude about Diabetes Afraid     Self-care barriers None    Self-management support Doctor's office    Other persons present Patient    Patient Concerns Problem Solving    Special Needs None    Preferred Learning Style No preference indicated    Learning Readiness Ready    How often do you need to have someone help you when you read instructions, pamphlets, or other written materials from your doctor or pharmacy? 1 - Never    What is the last grade level you completed in school? 12      Pre-Education Assessment   Patient understands the diabetes disease and treatment process. Needs Instruction    Patient understands incorporating nutritional management into lifestyle. Needs Instruction    Patient undertands incorporating physical activity into lifestyle. Needs Instruction    Patient understands using medications safely. Needs Instruction    Patient understands monitoring blood glucose, interpreting and using results Needs Instruction    Patient understands prevention, detection, and treatment of acute complications. Needs Instruction    Patient understands prevention, detection, and treatment of chronic complications. Needs Instruction    Patient understands how to develop strategies to address psychosocial issues. Needs Instruction    Patient understands how to develop strategies to promote health/change behavior. Needs Instruction      Complications   Last HgB A1C per patient/outside source 6.7 %   04/10/2021  How often do you check your blood sugar? 0 times/day (not testing)    Have you had a dilated eye exam in the past 12 months? No    Have you had a dental exam in the past 12 months? No    Are you checking your feet? No      Dietary Intake   Breakfast bread, eggs or milk    Lunch eggs, rice (combined with other foods)   4 pm   Dinner beef or lamb, rice, beans, salad or cabbage or collad greens   9 pm   Snack (evening) occasional chocolate    Beverage(s) water, juice, tea with honey, rare regular soda       Exercise   Exercise Type ADL's      Patient Education   Previous Diabetes Education No             Individualized Plan for Diabetes Self-Management Training:   Learning Objective:  Patient will have a greater understanding of diabetes self-management. Patient education plan is to attend individual and/or group sessions per assessed needs and concerns.   Plan:   Patient Instructions  Call your doctor for a prescription for Strips for the Accu-Chek Guide Me and lancets for the Accu-Chek Softclix lancing device.     Bake rather than fry. Choose lean meats. 1/2 your plate should be non-starchy vegetables 1/4 your plate starch  Aim to be active - Goal is 30 minutes most days of the week.  Aim for 3 Carb Choices per meal (45 grams) +/- 1 either way  Include protein in moderation with your meals and snacks Consider reading food labels for Total Carbohydrate of foods  Consider checking BG at alternate times per day  Consider taking medication as directed by MD    Expected Outcomes:     Education material provided: ADA - How to Thrive: A Guide for Your Journey with Diabetes, Food label handouts, Meal plan card, and Diabetes Resources  If problems or questions, patient to contact team via:  Phone  Future DSME appointment:

## 2021-07-24 NOTE — Patient Instructions (Addendum)
Call your doctor for a prescription for Strips for the Accu-Chek Guide Me and lancets for the Accu-Chek Softclix lancing device.     Bake rather than fry. Choose lean meats. 1/2 your plate should be non-starchy vegetables 1/4 your plate starch  Aim to be active - Goal is 30 minutes most days of the week.  Aim for 3 Carb Choices per meal (45 grams) +/- 1 either way  Include protein in moderation with your meals and snacks Consider reading food labels for Total Carbohydrate of foods  Consider checking BG at alternate times per day  Consider taking medication as directed by MD

## 2021-08-03 ENCOUNTER — Telehealth (HOSPITAL_COMMUNITY): Payer: Medicaid Other | Admitting: Psychiatry

## 2021-08-03 ENCOUNTER — Encounter (HOSPITAL_COMMUNITY): Payer: Self-pay

## 2021-08-21 ENCOUNTER — Telehealth (HOSPITAL_COMMUNITY): Payer: Self-pay | Admitting: *Deleted

## 2021-08-21 NOTE — Telephone Encounter (Signed)
Refill Request faxed for OLANZapine (ZYPREXA) 15 MG tablet ? ?*Refill request under name of Barbara George- ?Last seen on 08/09/20 ? ?*Patient has seen Barbara George last seen on 02/07/21 ?

## 2021-10-23 ENCOUNTER — Encounter: Payer: Medicaid Other | Attending: Nurse Practitioner | Admitting: Dietician

## 2021-10-23 ENCOUNTER — Encounter: Payer: Self-pay | Admitting: Dietician

## 2021-10-23 DIAGNOSIS — E119 Type 2 diabetes mellitus without complications: Secondary | ICD-10-CM | POA: Insufficient documentation

## 2021-10-23 NOTE — Progress Notes (Signed)
Diabetes Self-Management Education  Visit Type: Follow-up  Appt. Start Time: 1520 Appt. End Time: 1600  10/23/2021  [name], identified by name and date of birth, is a 26 y.o. female with a diagnosis of Diabetes:  .   ASSESSMENT Patient is here today alone.  She was last seen by this RD on 07/24/2021. She has lost 13 lbs in the past 3 months by reducing her sugar intake and eating a diet that is lower in carbohydrate. She started at the gym for 40-60 minutes 5 days per week. Mood has improved by getting out, exercising and seeing people. Blood glucose in the office today 123.  History includes Type 2 Diabetes (2022), hyperinsulinemia, bipolar depression Medications include Metformin, Zyprexa She states that the Zyprexa makes her sleep too much in the day and is often up until 2 am. A1C 6.7% and insulin 294 on 04/10/2021    Weight hx: 249 lbs 10/23/2020 262 lbs 07/24/2021 Lost 7 lbs recently but regained. 263 lbs is patient's highest weight 200 lbs 2019   Patient lives with her husband.  They shop together and patient cooks.  She is currently trying to conceive. She plans on returning to college and is bored at home.  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 She is from [place] and has been here since 2014. She eats a typical [place] which has increased amounts of rice. No pork for religious reasons. Allergic to peanuts  Weight 249 lb (112.9 kg). Body mass index is 37.86 kg/m.   Diabetes Self-Management Education - 10/23/21 1610       Visit Information   Visit Type Follow-up      Psychosocial Assessment   Patient Belief/Attitude about Diabetes Motivated to manage diabetes    What is the hardest part about your diabetes right now, causing you the most concern, or is the most worrisome to you about your diabetes?   Taking/obtaining medications;Checking blood sugar    Other persons present Patient       Pre-Education Assessment   Patient understands the diabetes disease and treatment process. Comprehends key points    Patient understands incorporating nutritional management into lifestyle. Comprehends key points    Patient undertands incorporating physical activity into lifestyle. Demonstrates understanding / competency    Patient understands using medications safely. Comprehends key points    Patient understands monitoring blood glucose, interpreting and using results Needs Review    Patient understands prevention, detection, and treatment of acute complications. Demonstrates understanding / competency    Patient understands prevention, detection, and treatment of chronic complications. Demonstrates understanding / competency    Patient understands how to develop strategies to address psychosocial issues. Comprehends key points    Patient understands how to develop strategies to promote health/change behavior. Comprehends key points      Complications   How often do you check your blood sugar? 0 times/day (not testing)      Dietary Intake   Breakfast --   5-6 pm   Lunch fruit and milk OR bread and egg    Dinner rice or maize, vegetables, lean meat OR salad and air fried meat    Beverage(s) water, beverages with zero carbohydrates      Activity / Exercise   Activity / Exercise Type Moderate (swimming / aerobic walking)    How many days per week do you exercise? 5    How many minutes per day do you exercise? 40  Total minutes per week of exercise 200      Patient Education   Previous Diabetes Education Yes (please comment)   07/24/2021   Healthy Eating Meal options for control of blood glucose level and chronic complications.;Meal timing in regards to the patients' current diabetes medication.    Being Active Other (comment)   encouraged positive changes   Medications Reviewed patients medication for diabetes, action, purpose, timing of dose and side effects.   answered questions  regarding herbs and safety with diabetes (recommended against bitter melon)   Monitoring Identified appropriate SMBG and/or A1C goals.    Preconception care Other (comment)   discussed patient concerns     Individualized Goals (developed by patient)   Nutrition General guidelines for healthy choices and portions discussed    Physical Activity Exercise 3-5 times per week;45 minutes per day    Medications take my medication as prescribed    Monitoring  Test my blood glucose as discussed    Problem Solving Medication consistency;Eating Pattern      Patient Self-Evaluation of Goals - Patient rates self as meeting previously set goals (% of time)   Nutrition >75% (most of the time)    Physical Activity >75% (most of the time)    Medications >75% (most of the time)    Monitoring < 25% (hardly ever/never)    Problem Solving and behavior change strategies  >75% (most of the time)    Reducing Risk (treating acute and chronic complications) >75% (most of the time)    Health Coping >75% (most of the time)      Post-Education Assessment   Patient understands the diabetes disease and treatment process. Comprehends key points    Patient understands incorporating nutritional management into lifestyle. Comprehends key points    Patient undertands incorporating physical activity into lifestyle. Demonstrates understanding / competency    Patient understands using medications safely. Demonstrates understanding / competency    Patient understands monitoring blood glucose, interpreting and using results Comprehends key points    Patient understands prevention, detection, and treatment of acute complications. Demonstrates understanding / competency    Patient understands prevention, detection, and treatment of chronic complications. Demonstrates understanding / competency    Patient understands how to develop strategies to address psychosocial issues. Comprehends key points    Patient understands how to  develop strategies to promote health/change behavior. Comprehends key points      Outcomes   Expected Outcomes Demonstrated interest in learning. Expect positive outcomes    Future DMSE 3-4 months    Program Status Not Completed      Subsequent Visit   Since your last visit have you experienced any weight changes? Loss    Weight Loss (lbs) 13    Since your last visit, are you checking your blood glucose at least once a day? No             Individualized Plan for Diabetes Self-Management Training:   Learning Objective:  Patient will have a greater understanding of diabetes self-management. Patient education plan is to attend individual and/or group sessions per assessed needs and concerns.   Plan:   Patient Instructions  Randie HeinzGreat job on the changes that you have made!  Continue to stay active  Continue to be mindful about your food choices  Eat plenty of vegetables and lean meats  Small portions rice  Consider checking your blood glucose periodically throughout the week.  Blood glucose goals:   80-130 first thing when you wake before food  or drink   Less than 180 two hours after starting a meal Expected Outcomes:  Demonstrated interest in learning. Expect positive outcomes  Education material provided:   If problems or questions, patient to contact team via:  Phone  Future DSME appointment: 3-4 months

## 2021-10-23 NOTE — Patient Instructions (Signed)
Great job on the changes that you have made!  Continue to stay active  Continue to be mindful about your food choices  Eat plenty of vegetables and lean meats  Small portions rice  Consider checking your blood glucose periodically throughout the week.  Blood glucose goals:   80-130 first thing when you wake before food or drink   Less than 180 two hours after starting a meal

## 2021-11-10 ENCOUNTER — Telehealth (HOSPITAL_COMMUNITY): Payer: Medicaid Other | Admitting: Psychiatry

## 2021-11-10 ENCOUNTER — Encounter (HOSPITAL_COMMUNITY): Payer: Self-pay

## 2021-12-01 IMAGING — US US PELVIS COMPLETE WITH TRANSVAGINAL
1 series · 15 of 25 positions shown · non-contrast
Comparison: None

CLINICAL DATA: Abnormal uterine bleeding.  LMP 06/25/2018.

EXAM:
TRANSABDOMINAL AND TRANSVAGINAL ULTRASOUND OF PELVIS
TECHNIQUE: Both transabdominal and transvaginal ultrasound examinations of the
pelvis were performed. Transabdominal technique was performed for
global imaging of the pelvis including uterus, ovaries, adnexal
regions, and pelvic cul-de-sac. It was necessary to proceed with
endovaginal exam following the transabdominal exam to visualize the
endometrium and ovaries.

[Series 1: us pelvis complete with transvaginal · 15 of 73 slices shown]
[im 1/73]
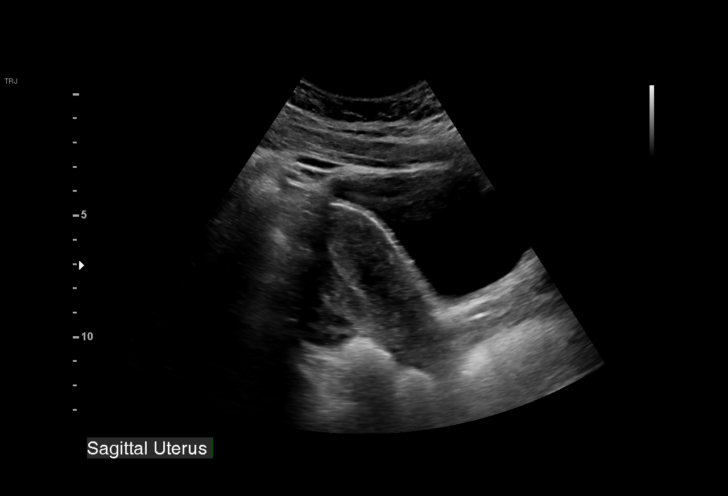
[im 7/73]
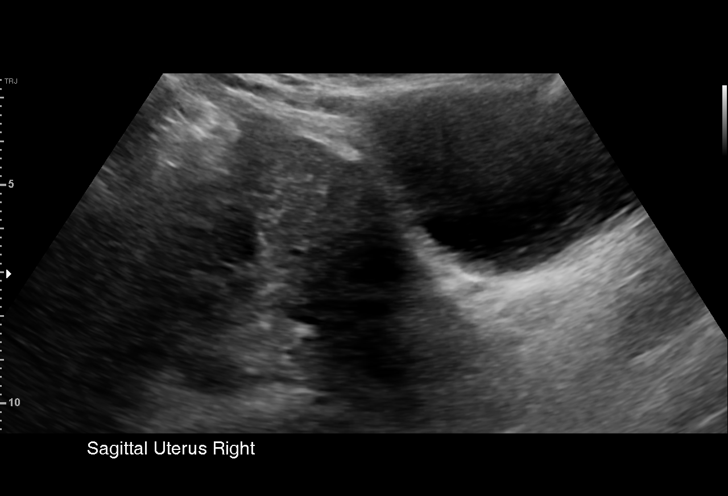
[im 13/73]
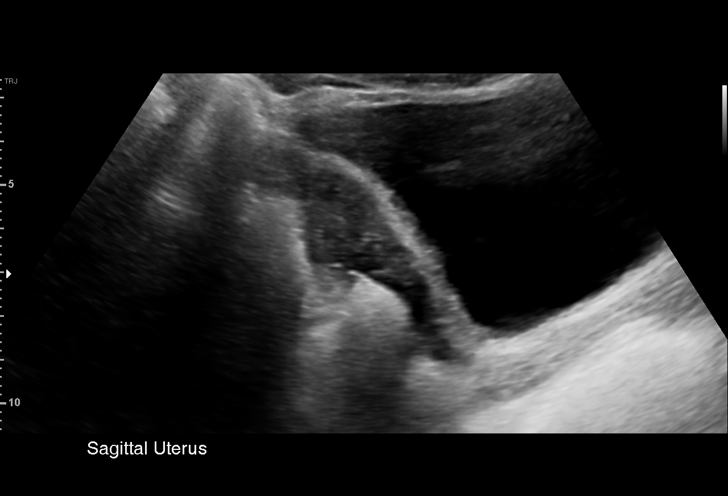
[im 16/73]
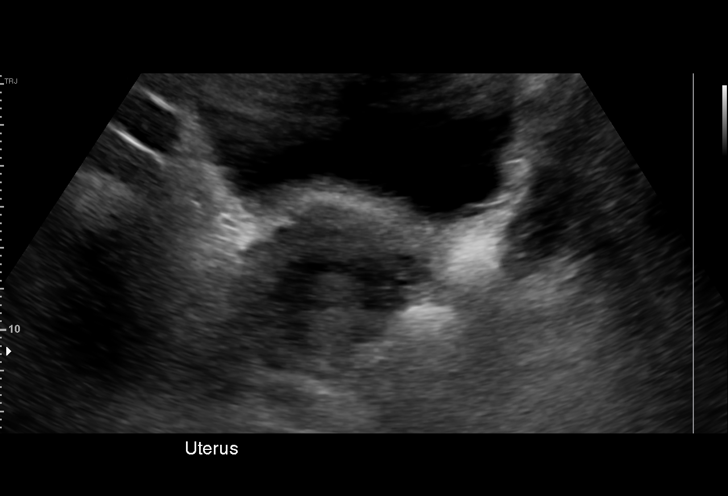
[im 22/73]
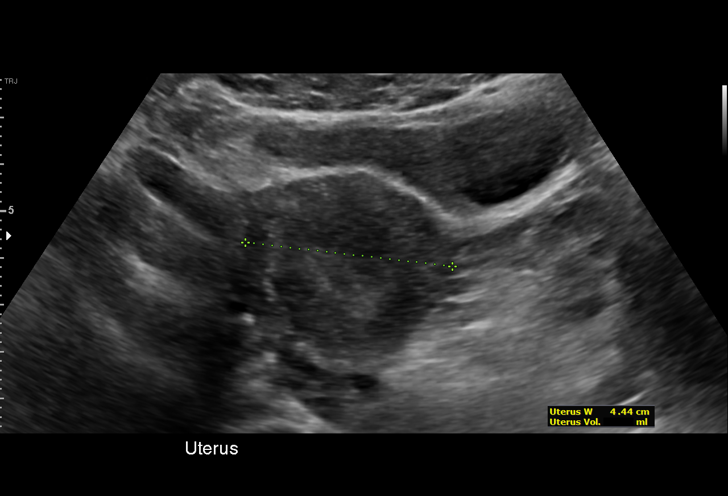
[im 28/73]
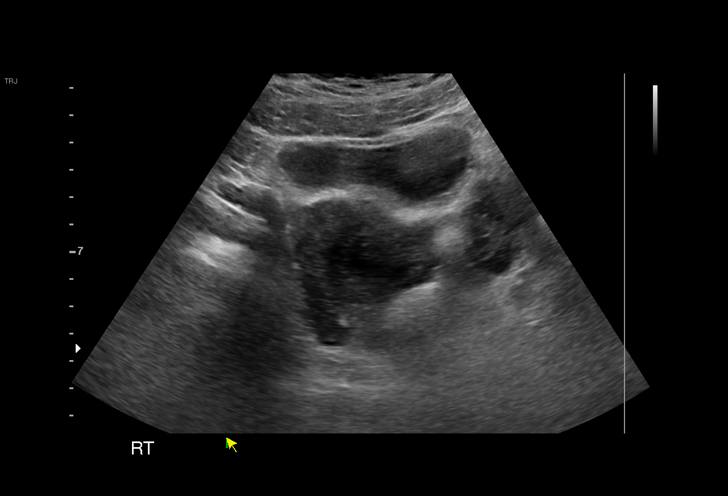
[im 31/73]
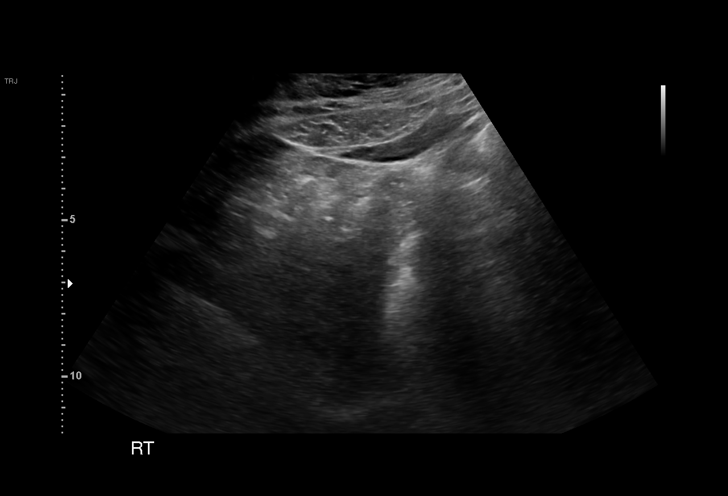
[im 37/73]
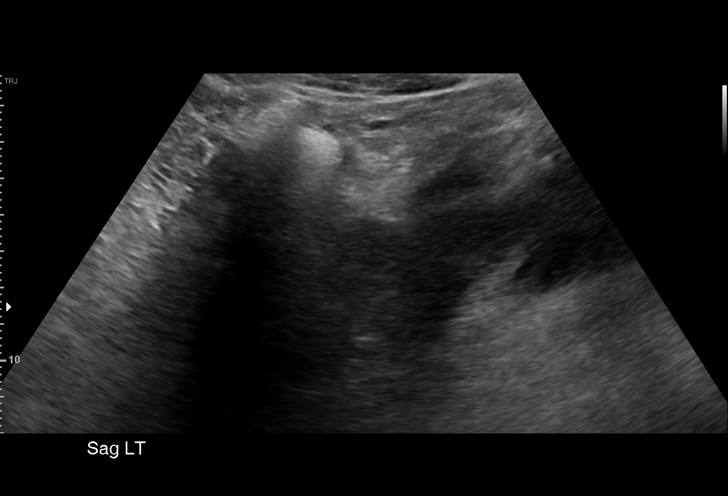
[im 43/73]
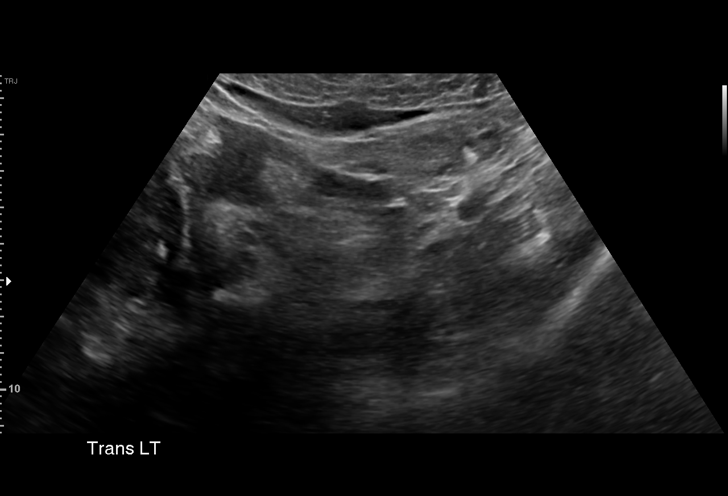
[im 46/73]
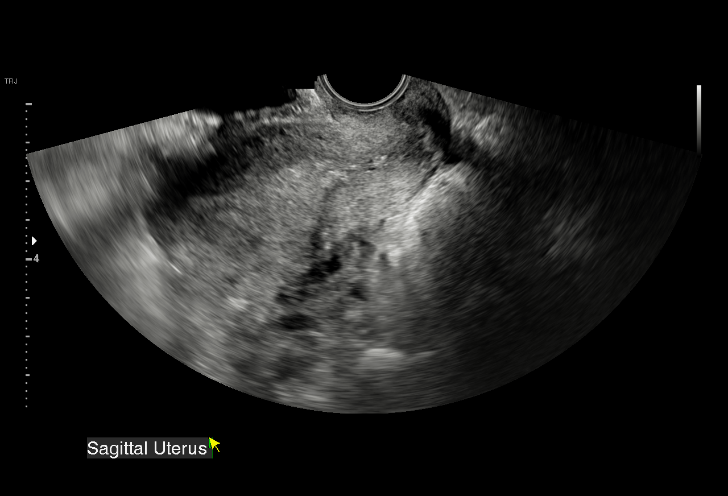
[im 52/73]
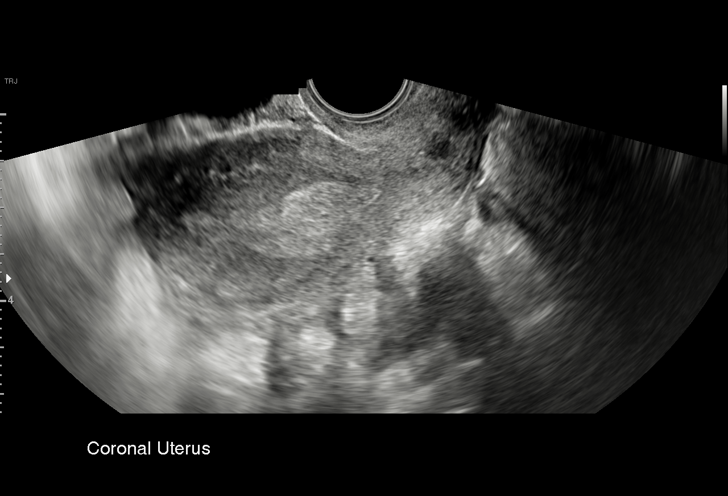
[im 58/73]
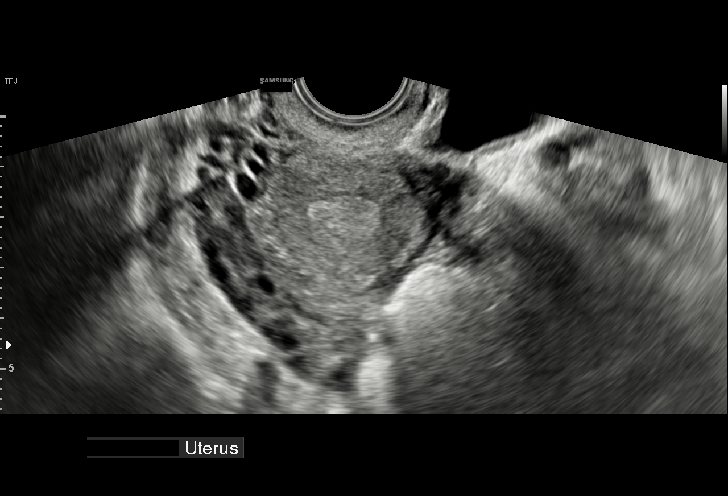
[im 61/73]
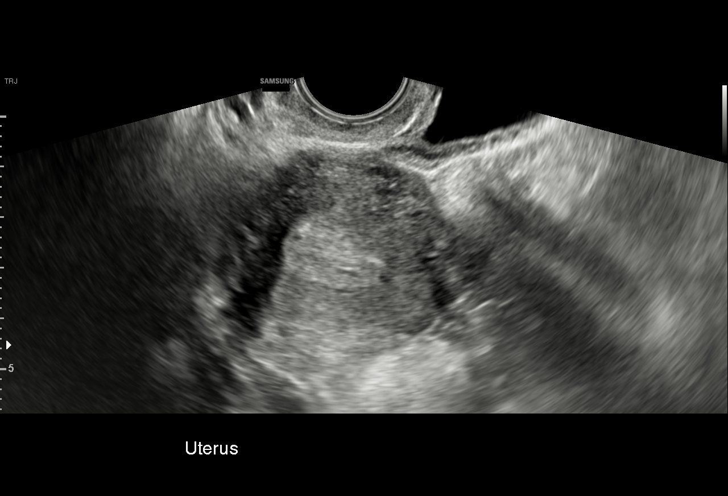
[im 67/73]
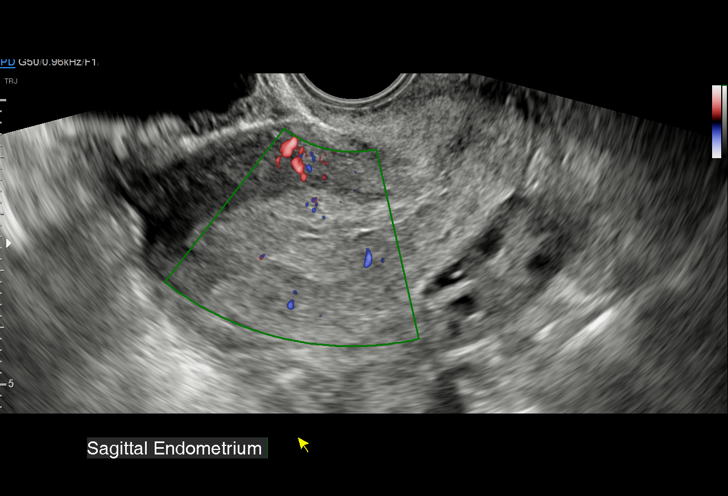
[im 73/73]
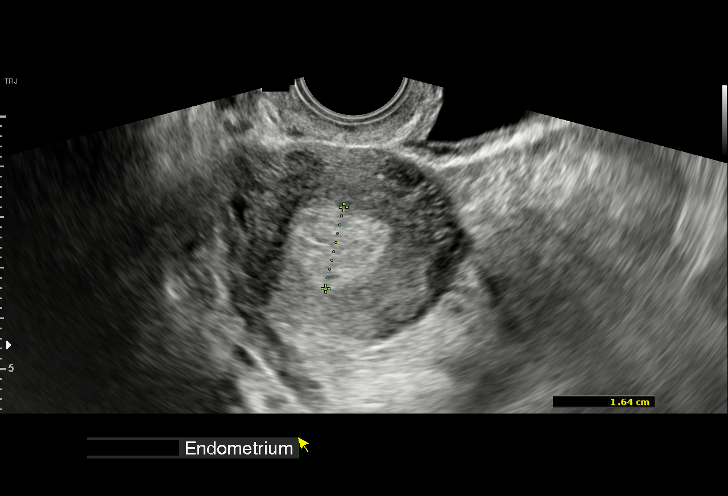

[15 of 25 positions shown; findings below may reference images not displayed]

FINDINGS: Uterus

Measurements: 8.8 x 3.4 x 4.4 cm = volume: 70 mL. No fibroids or
other mass visualized.

Endometrium

Thickness: 14 mm.  No focal abnormality visualized.

Right ovary

Measurements: 5.4 x 2.0 x 1.9 cm = volume: 10.3 mL. Normal
appearance/no adnexal mass.

Left ovary

Measurements: 4.2 x 1.5 x 2.8 cm = volume: 9.0 mL. Normal
appearance/no adnexal mass.

Other findings

No abnormal free fluid.
IMPRESSION: Endometrial thickness measures 14 mm. If bleeding remains
unresponsive to hormonal or medical therapy, sonohysterogram should
be considered for focal lesion work-up. (Ref: Radiological
Reasoning: Algorithmic Workup of Abnormal Vaginal Bleeding with
Endovaginal Sonography and Sonohysterography. AJR [id number]; [id number])

No evidence of uterine fibroids or other significant abnormality.

## 2022-01-01 ENCOUNTER — Ambulatory Visit (INDEPENDENT_AMBULATORY_CARE_PROVIDER_SITE_OTHER): Payer: Medicaid Other | Admitting: Obstetrics and Gynecology

## 2022-01-01 ENCOUNTER — Encounter: Payer: Self-pay | Admitting: Obstetrics and Gynecology

## 2022-01-01 VITALS — BP 128/82 | HR 105 | Ht 68.0 in | Wt 248.0 lb

## 2022-01-01 DIAGNOSIS — Z3169 Encounter for other general counseling and advice on procreation: Secondary | ICD-10-CM | POA: Diagnosis not present

## 2022-01-01 LAB — POCT URINE PREGNANCY: Preg Test, Ur: NEGATIVE

## 2022-01-01 NOTE — Progress Notes (Signed)
Irregular bleeding since November 2022 Trying to conceive

## 2022-01-01 NOTE — Progress Notes (Signed)
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 Patient reports a monthly period. Her partner had a semen analysis which demonstrated a low sperm count. Patient had a recent diagnosis of diabetes currently on metformin. She reports regular exercises regimen which she started with her partner. Patient is actively seeking a pregnancy  Past Medical History:  Diagnosis Date   Bipolar 1 disorder (HCC)    Depression    Diabetes mellitus without complication (HCC)    Type 2 (Pre)   Schizophrenia (HCC)    No past surgical history on file. Family History  Problem Relation Age of Onset   Hypertension Father    Social History   Tobacco Use   Smoking status: Never   Smokeless tobacco: Never  Vaping Use   Vaping Use: Never used  Substance Use Topics   Alcohol use: No    Alcohol/week: 0.0 standard drinks of alcohol   Drug use: No   ROS See pertinent in HPI. All other systems reviewed and non contributory  Height 5\' 8"  (1.727 m), weight 248 lb (112.5 kg), last menstrual period 11/25/2021. GENERAL: Well-developed, well-nourished female in no acute distress.  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 30 for potential IUI - Advised patient to take prenatal vitamins - Congratulated patient on exercise regimen - A1c today - Patient will be contacted with abnormal results

## 2022-01-02 LAB — HEMOGLOBIN A1C
Est. average glucose Bld gHb Est-mCnc: 134 mg/dL
Hgb A1c MFr Bld: 6.3 % — ABNORMAL HIGH (ref 4.8–5.6)

## 2022-01-05 ENCOUNTER — Telehealth: Payer: Self-pay

## 2022-01-05 NOTE — Telephone Encounter (Signed)
S/w pt and advised of results and need to keep appt on 01/25/22, pt agreed.

## 2022-01-25 ENCOUNTER — Encounter: Payer: Self-pay | Admitting: Dietician

## 2022-01-25 ENCOUNTER — Encounter: Payer: Medicaid Other | Attending: Nurse Practitioner | Admitting: Dietician

## 2022-01-25 DIAGNOSIS — E119 Type 2 diabetes mellitus without complications: Secondary | ICD-10-CM | POA: Diagnosis present

## 2022-01-25 NOTE — Patient Instructions (Addendum)
Great job on the changes that you have made!             Continue to be mindful about your food choices             Eat plenty of vegetables and lean meats             Small portions rice  Consider going back to the gym.     Consider checking your blood glucose periodically throughout the week.             Blood glucose goals:                         80-130 first thing when you wake before food or drink                         Less than 180 two hours after starting a meal

## 2022-01-25 NOTE — Progress Notes (Signed)
Diabetes Self-Management Education  Visit Type: Follow-up  Appt. Start Time: 1540 Appt. End Time: 1615  01/25/2022  [name], identified by name and date of birth, is a 26 y.o. female with a diagnosis of Diabetes:  .   ASSESSMENT Patient is here today alone.  She was last seen by this RD 10/23/2021.  She states that she has stopped going to the gym and states that she should go back but it is hard to restart. Skips the Zyprexa at times and then will not sleep or will sleep during the day and not at night. She states that she pays attention to the total carbohydrate.  History includes Type 2 Diabetes (2022), hyperinsulinemia, bipolar depression Medications include Metformin, Zyprexa She states that the Zyprexa makes her sleep too much in the day and is often up until 2 am. A1C 6.3% 01/01/2022 decreased from 6.7% 04/10/2021    Weight hx: 247 lbs 01/25/2022 249 lbs 10/23/2020 262 lbs 07/24/2021 Lost 7 lbs recently but regained. 263 lbs is patient's highest weight 200 lbs 2019   Patient lives with her husband.  They shop together and patient cooks.  She is currently trying to conceive. She plans on returning to college and is bored at home.  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 She is from [place] and has been here since 2014. She eats a typical [place] which has increased amounts of rice. No pork for religious reasons. Allergic to peanuts  Weight 247 lb (112 kg), last menstrual period 11/25/2021. Body mass index is 37.56 kg/m.   Diabetes Self-Management Education - 01/25/22 1715       Visit Information   Visit Type Follow-up      Psychosocial Assessment   Self-care barriers English as a second language    Self-management support Doctor's office    Other persons present Patient    Patient Concerns Nutrition/Meal planning;Glycemic Control    Special Needs None    Preferred Learning Style No preference  indicated    Learning Readiness Ready    How often do you need to have someone help you when you read instructions, pamphlets, or other written materials from your doctor or pharmacy? 1 - Never      Pre-Education Assessment   Patient understands the diabetes disease and treatment process. Comprehends key points    Patient understands incorporating nutritional management into lifestyle. Comprehends key points    Patient undertands incorporating physical activity into lifestyle. Demonstrates understanding / competency    Patient understands using medications safely. Comprehends key points    Patient understands monitoring blood glucose, interpreting and using results Needs Review    Patient understands prevention, detection, and treatment of acute complications. Demonstrates understanding / competency    Patient understands prevention, detection, and treatment of chronic complications. Demonstrates understanding / competency    Patient understands how to develop strategies to address psychosocial issues. Comprehends key points    Patient understands how to develop strategies to promote health/change behavior. Comprehends key points      Complications   Last HgB A1C per patient/outside source 6.3 %    How often do you check your blood sugar? 0 times/day (not testing)      Dietary Intake   Breakfast 10-11 am Milk, bread or skips    Lunch skips    Dinner 5-6 pm rice, beans, sauce, occasional vegetables    Snack (evening) "not much"    Beverage(s) water,  occasional juice, 2% or whole milk      Activity / Exercise   Activity / Exercise Type Light (walking / raking leaves)      Patient Education   Previous Diabetes Education Yes (please comment)    Healthy Eating Other (comment)   balanced meals, mindful eating,   Being Active Role of exercise on diabetes management, blood pressure control and cardiac health.    Medications Reviewed patients medication for diabetes, action, purpose, timing  of dose and side effects.    Preconception care Role of family planning for patients with diabetes      Individualized Goals (developed by patient)   Nutrition Follow meal plan discussed    Physical Activity Exercise 5-7 days per week;30 minutes per day    Medications take my medication as prescribed    Health Coping Ask for help with psychological, social, or emotional issues      Patient Self-Evaluation of Goals - Patient rates self as meeting previously set goals (% of time)   Nutrition >75% (most of the time)    Physical Activity 25 - 50% (sometimes)    Medications 50 - 75 % (half of the time)    Monitoring < 25% (hardly ever/never)    Problem Solving and behavior change strategies  >75% (most of the time)    Reducing Risk (treating acute and chronic complications) >75% (most of the time)    Health Coping >75% (most of the time)      Post-Education Assessment   Patient understands the diabetes disease and treatment process. Comprehends key points    Patient understands incorporating nutritional management into lifestyle. Comprehends key points    Patient undertands incorporating physical activity into lifestyle. Comprehends key points    Patient understands using medications safely. Comphrehends key points    Patient understands monitoring blood glucose, interpreting and using results Comprehends key points    Patient understands prevention, detection, and treatment of acute complications. Comprehends key points    Patient understands prevention, detection, and treatment of chronic complications. Comprehends key points    Patient understands how to develop strategies to address psychosocial issues. Comprehends key points    Patient understands how to develop strategies to promote health/change behavior. Comprehends key points      Outcomes   Expected Outcomes Demonstrated interest in learning. Expect positive outcomes    Future DMSE 2 months    Program Status Not Completed       Subsequent Visit   Since your last visit have you continued or begun to take your medications as prescribed? Yes    Since your last visit have you experienced any weight changes? Loss    Weight Loss (lbs) 2             Individualized Plan for Diabetes Self-Management Training:   Learning Objective:  Patient will have a greater understanding of diabetes self-management. Patient education plan is to attend individual and/or group sessions per assessed needs and concerns.   Plan:   Patient Instructions  Randie He[employer] job on the changes that you have made!             Continue to be mindful about your food choices             Eat plenty of vegetables and lean meats             Small portions rice  Consider going back to the gym.     Consider checking your blood glucose periodically throughout the  week.             Blood glucose goals:                         80-130 first thing when you wake before food or drink                         Less than 180 two hours after starting a meal Expected Outcomes:  Demonstrated interest in learning. Expect positive outcomes  Education material provided:   If problems or questions, patient to contact team via:  Phone  Future DSME appointment: 2 months

## 2022-03-26 ENCOUNTER — Encounter: Payer: Self-pay | Admitting: Dietician

## 2022-03-26 ENCOUNTER — Encounter: Payer: Medicaid Other | Attending: Nurse Practitioner | Admitting: Dietician

## 2022-03-26 DIAGNOSIS — E119 Type 2 diabetes mellitus without complications: Secondary | ICD-10-CM | POA: Insufficient documentation

## 2022-03-26 NOTE — Patient Instructions (Signed)
Great job on the changes that you have made!             Continue to be mindful about your food choices             Eat plenty of vegetables and lean meats             Small portions rice   Consider going back to the gym or finding other ways to be more active.   Consider checking your blood glucose periodically throughout the week.             Blood glucose goals:                         80-130 first thing when you wake before food or drink                         Less than 180 two hours after starting a meal

## 2022-03-26 NOTE — Progress Notes (Signed)
Diabetes Self-Management Education  Visit Type: Follow-up  Appt. Start Time: 1515 (late) Appt. End Time: 1545  03/26/2022  Barbara George, identified by name and date of birth, is a 26 y.o. female with a diagnosis of Diabetes:  .   ASSESSMENT Patient is here today alone.  She was last seen by this RD on 01/25/2022.  She has continued to regain weight.   She is having very long menstrual cycles and only a few days between cycles. She is planning a trip to [place] next week with her brothers and has not been able to exercise.  She states that she has not exercised for about 1 month. She is not checking her blood glucose. Reports labs today.  Results currently unknown. She is trying to be more mindful about stopping eating when she is full.  History includes Type 2 Diabetes (2022), hyperinsulinemia, bipolar depression Medications include Metformin, Zyprexa She states that the Zyprexa makes her sleep too much in the day and is often up until 2 am. A1C 6.3% 01/01/2022 decreased from 6.7% 04/10/2021  Sleep:  >8 hours per night  Weight hx: 255 lbs 03/26/2022 247 lbs 01/25/2022 249 lbs 10/23/2020 262 lbs 07/24/2021 Lost 7 lbs recently but regained. 263 lbs is patient's highest weight 200 lbs 2019   Patient lives with her husband.  They shop together and patient cooks.  She is currently trying to conceive. She plans on returning to college and is bored at home.  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 She is from [place] and has been here since 2014. She eats a typical [place] which has increased amounts of rice. No pork for religious reasons. Allergic to peanuts  Weight 255 lb (115.7 kg). Body mass index is 38.77 kg/m.   Diabetes Self-Management Education - 03/26/22 1600       Visit Information   Visit Type Follow-up      Psychosocial Assessment   Self-care barriers English as a second language     Self-management support Doctor's office;CDE visits    Other persons present Patient    Patient Concerns Nutrition/Meal planning      Pre-Education Assessment   Patient understands the diabetes disease and treatment process. Comprehends key points    Patient understands incorporating nutritional management into lifestyle. Comprehends key points    Patient undertands incorporating physical activity into lifestyle. Comprehends key points    Patient understands using medications safely. Comprehends key points    Patient understands monitoring blood glucose, interpreting and using results Comprehends key points    Patient understands prevention, detection, and treatment of acute complications. Demonstrates understanding / competency    Patient understands prevention, detection, and treatment of chronic complications. Demonstrates understanding / competency    Patient understands how to develop strategies to address psychosocial issues. Comprehends key points    Patient understands how to develop strategies to promote health/change behavior. Comprehends key points      Complications   How often do you check your blood sugar? 0 times/day (not testing)      Dietary Intake   Breakfast sweet bread, cheese, tea with honey OR cassava, onions, cucumbers, peppers, fish   11 am   Lunch skips    Snack (afternoon) occasional popcorn    Dinner rice or maize, hamburger or other meat or beans, vegetable sauce OR potatoes, eggs, vegetables   6-8:30 pm   Beverage(s) water, tea with honey, occasional juice  Activity / Exercise   Activity / Exercise Type ADL's      Patient Education   Previous Diabetes Education Yes (please comment)   01/25/2022   Healthy Eating Meal options for control of blood glucose level and chronic complications.    Being Active Role of exercise on diabetes management, blood pressure control and cardiac health.    Medications Reviewed patients medication for diabetes, action,  purpose, timing of dose and side effects.      Individualized Goals (developed by patient)   Nutrition General guidelines for healthy choices and portions discussed    Physical Activity Exercise 3-5 times per week;30 minutes per day    Medications take my medication as prescribed    Health Coping Ask for help with psychological, social, or emotional issues      Patient Self-Evaluation of Goals - Patient rates self as meeting previously set goals (% of time)   Nutrition >75% (most of the time)    Physical Activity < 25% (hardly ever/never)    Medications >75% (most of the time)    Monitoring < 25% (hardly ever/never)    Problem Solving and behavior change strategies  50 - 75 % (half of the time)    Reducing Risk (treating acute and chronic complications) 50 - 75 % (half of the time)    Health Coping >75% (most of the time)      Post-Education Assessment   Patient understands the diabetes disease and treatment process. Comprehends key points    Patient understands incorporating nutritional management into lifestyle. Comprehends key points    Patient undertands incorporating physical activity into lifestyle. Comprehends key points    Patient understands using medications safely. Comphrehends key points    Patient understands monitoring blood glucose, interpreting and using results Comprehends key points    Patient understands prevention, detection, and treatment of acute complications. Comprehends key points    Patient understands prevention, detection, and treatment of chronic complications. Comprehends key points    Patient understands how to develop strategies to address psychosocial issues. Comprehends key points    Patient understands how to develop strategies to promote health/change behavior. Needs Review      Outcomes   Expected Outcomes Demonstrated interest in learning. Expect positive outcomes    Future DMSE 3-4 months    Program Status Not Completed      Subsequent Visit    Since your last visit have you continued or begun to take your medications as prescribed? Yes    Since your last visit have you experienced any weight changes? Gain    Weight Gain (lbs) 8    Since your last visit, are you checking your blood glucose at least once a day? No             Individualized Plan for Diabetes Self-Management Training:   Learning Objective:  Patient will have a greater understanding of diabetes self-management. Patient education plan is to attend individual and/or group sessions per assessed needs and concerns.   Plan:   Patient Instructions  Randie He[employer] job on the changes that you have made!             Continue to be mindful about your food choices             Eat plenty of vegetables and lean meats             Small portions rice   Consider going back to the gym or finding other ways to be more active.  Consider checking your blood glucose periodically throughout the week.             Blood glucose goals:                         80-130 first thing when you wake before food or drink                         Less than 180 two hours after starting a meal  Expected Outcomes:  Demonstrated interest in learning. Expect positive outcomes  Education material provided:   If problems or questions, patient to contact team via:  Phone  Future DSME appointment: 3-4 months

## 2022-03-31 NOTE — Progress Notes (Unsigned)
Patient did not connect for virtual psychiatric medication management appointment on 04/02/22 at [place] phone with no answer and unable to leave VM.  Alda Berthold, MD 04/02/22

## 2022-04-02 ENCOUNTER — Encounter (HOSPITAL_COMMUNITY): Payer: Self-pay

## 2022-04-02 ENCOUNTER — Encounter (HOSPITAL_COMMUNITY): Payer: Medicaid Other | Admitting: Psychiatry

## 2022-06-18 ENCOUNTER — Encounter: Payer: Medicaid Other | Attending: Nurse Practitioner | Admitting: Dietician

## 2022-06-18 ENCOUNTER — Encounter: Payer: Self-pay | Admitting: Dietician

## 2022-06-18 VITALS — Wt 248.0 lb

## 2022-06-18 DIAGNOSIS — E119 Type 2 diabetes mellitus without complications: Secondary | ICD-10-CM | POA: Diagnosis not present

## 2022-06-18 NOTE — Progress Notes (Signed)
Diabetes Self-Management Education  Visit Type: Follow-up  Appt. Start Time: 1155 Appt. End Time: 1220 Patient is here today alone.  She was last seen in this office on 03/26/2022. 06/18/2022  Barbara George, identified by name and date of birth, is a 27 y.o. female with a diagnosis of Diabetes:  .   ASSESSMENT Just got back from [place] and was there about 1 month. She has started college at [employer] for [employer]. Her and her husband are going to the gym 3 days per week. She has lost 7 lbs since October 23. Stops eating when full.  Reduced the amount that she puts on the plate. Continues to have very long menstrual cycles.  History includes Type 2 Diabetes (2022), hyperinsulinemia, bipolar depression Medications include Metformin, Zyprexa She states that the Zyprexa makes her sleep too much in the day and is often up until 2 am. A1C 6.3% 01/01/2022 decreased from 6.7% 04/10/2021  Sleep:  >8 hours per night   Weight hx: 248 lbs 06/18/22 255 lbs 03/26/2022 247 lbs 01/25/2022 249 lbs 10/23/2020 262 lbs 07/24/2021 Lost 7 lbs recently but regained. 263 lbs is patient's highest weight 200 lbs 2019   Patient lives with her husband.  They shop together and patient cooks.  She is currently trying to conceive. She plans on returning to college and is bored at home.  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 She is from [place] and has been here since 2014. She eats a typical [place] which has increased amounts of rice. No pork for religious reasons. Allergic to peanuts  Weight 248 lb (112.5 kg). Body mass index is 37.71 kg/m.   Diabetes Self-Management Education - 06/18/22 1325       Visit Information   Visit Type Follow-up      Psychosocial Assessment   Patient Belief/Attitude about Diabetes Motivated to manage diabetes    Self-management support Doctor's office;CDE visits    Other persons present Patient     Patient Concerns Nutrition/Meal planning    Special Needs None    Preferred Learning Style No preference indicated    Learning Readiness Ready    How often do you need to have someone help you when you read instructions, pamphlets, or other written materials from your doctor or pharmacy? 1 - Never      Pre-Education Assessment   Patient understands the diabetes disease and treatment process. Comprehends key points    Patient understands incorporating nutritional management into lifestyle. Comprehends key points    Patient undertands incorporating physical activity into lifestyle. Comprehends key points    Patient understands using medications safely. Comprehends key points    Patient understands monitoring blood glucose, interpreting and using results Comprehends key points    Patient understands prevention, detection, and treatment of acute complications. Demonstrates understanding / competency    Patient understands prevention, detection, and treatment of chronic complications. Demonstrates understanding / competency    Patient understands how to develop strategies to address psychosocial issues. Demonstrates understanding / competency    Patient understands how to develop strategies to promote health/change behavior. Comprehends key points      Complications   How often do you check your blood sugar? Not recommended by provider      Dietary Intake   Breakfast bean dumplings, sauce with vegetables, occasional homemade yogurt    Snack (morning) none    Lunch skips frequently    Snack (afternoon) none  Dinner rice, onions, fried fish    Snack (evening) occasional coconuts, chips, laughing cow cheese    Beverage(s) water, occasional juice, tea with honey, 2% milk      Activity / Exercise   Activity / Exercise Type Light (walking / raking leaves)    How many days per week do you exercise? 3    How many minutes per day do you exercise? 60    Total minutes per week of exercise 180       Patient Education   Previous Diabetes Education Yes (please comment)   10/23   Healthy Eating Meal options for control of blood glucose level and chronic complications.    Being Active Role of exercise on diabetes management, blood pressure control and cardiac health.    Diabetes Stress and Support Identified and addressed patients feelings and concerns about diabetes      Individualized Goals (developed by patient)   Nutrition General guidelines for healthy choices and portions discussed    Physical Activity Exercise 3-5 times per week    Medications take my medication as prescribed    Problem Solving Eating Pattern      Patient Self-Evaluation of Goals - Patient rates self as meeting previously set goals (% of time)   Nutrition >75% (most of the time)    Physical Activity >75% (most of the time)    Medications >75% (most of the time)    Monitoring Not Applicable    Problem Solving and behavior change strategies  50 - 75 % (half of the time)    Reducing Risk (treating acute and chronic complications) 50 - 75 % (half of the time)    Health Coping >75% (most of the time)      Post-Education Assessment   Patient understands the diabetes disease and treatment process. Demonstrates understanding / competency    Patient understands incorporating nutritional management into lifestyle. Comprehends key points    Patient undertands incorporating physical activity into lifestyle. Comprehends key points    Patient understands using medications safely. Comphrehends key points    Patient understands monitoring blood glucose, interpreting and using results Comprehends key points    Patient understands prevention, detection, and treatment of acute complications. Demonstrates understanding / competency    Patient understands prevention, detection, and treatment of chronic complications. Demonstrates understanding / competency    Patient understands how to develop strategies to address psychosocial  issues. Demonstrates understanding / competency    Patient understands how to develop strategies to promote health/change behavior. Comprehends key points      Outcomes   Expected Outcomes Demonstrated interest in learning. Expect positive outcomes    Future DMSE 3-4 months    Program Status Not Completed      Subsequent Visit   Since your last visit have you experienced any weight changes? Loss    Weight Loss (lbs) 7    Since your last visit, are you checking your blood glucose at least once a day? N/A             Individualized Plan for Diabetes Self-Management Training:   Learning Objective:  Patient will have a greater understanding of diabetes self-management. Patient education plan is to attend individual and/or group sessions per assessed needs and concerns.   Plan:   Patient Instructions  [facility] Medicaid Contact Center [phone]  [facility] Department of Social Services Address: [facility]  [address], [place], [place] [address] Phone: [phone]   Randie [employer] job on the changes that you have  made!             Continue to be mindful about your food choices             Eat plenty of vegetables and lean meats             Small portions rice   Being more active   Consider checking your blood glucose periodically throughout the week.             Blood glucose goals:                        80-130 first thing when you wake before food or drink                         Less than 180 two hours after starting a meal  Expected Outcomes:  Demonstrated interest in learning. Expect positive outcomes  Education material provided:   If problems or questions, patient to contact team via:    Future DSME appointment: 3-4 months

## 2022-06-18 NOTE — Patient Instructions (Addendum)
[facility] Medicaid [place] Department of Social Services Address: [address] [place], [place] [address] Phone: [phone]   Doristine Devoid job on the changes that you have made!             Continue to be mindful about your food choices             Eat plenty of vegetables and lean meats             Small portions rice   Being more active   Consider checking your blood glucose periodically throughout the week.             Blood glucose goals:                        80-130 first thing when you wake before food or drink                         Less than 180 two hours after starting a meal

## 2022-07-06 ENCOUNTER — Encounter (HOSPITAL_COMMUNITY): Payer: Self-pay

## 2022-07-06 ENCOUNTER — Ambulatory Visit (HOSPITAL_COMMUNITY)
Admission: EM | Admit: 2022-07-06 | Discharge: 2022-07-06 | Disposition: A | Discharge status: Home / Self Care | Payer: Medicaid Other | Attending: Emergency Medicine | Admitting: Emergency Medicine

## 2022-07-06 DIAGNOSIS — R1030 Lower abdominal pain, unspecified: Secondary | ICD-10-CM

## 2022-07-06 DIAGNOSIS — N898 Other specified noninflammatory disorders of vagina: Secondary | ICD-10-CM | POA: Diagnosis not present

## 2022-07-06 DIAGNOSIS — N926 Irregular menstruation, unspecified: Secondary | ICD-10-CM

## 2022-07-06 LAB — POCT URINALYSIS DIPSTICK, ED / UC
Bilirubin Urine: NEGATIVE
Glucose, UA: NEGATIVE mg/dL
Ketones, ur: NEGATIVE mg/dL
Leukocytes,Ua: NEGATIVE
Nitrite: NEGATIVE
Protein, ur: NEGATIVE mg/dL
Specific Gravity, Urine: <=1.005 (ref 1.005–1.030)
Urobilinogen, UA: 0.2 mg/dL (ref 0.0–1.0)
pH: 5.5 (ref 5.0–8.0)

## 2022-07-06 LAB — POC URINE PREG, ED: Preg Test, Ur: NEGATIVE

## 2022-07-06 MED ORDER — LETROZOLE 2.5 MG PO TABS: 2.5000 mg | ORAL_TABLET | Freq: Every day | ORAL | 2 refills | Status: DC

## 2022-07-06 MED ORDER — METRONIDAZOLE 500 MG PO TABS: 500.0000 mg | ORAL_TABLET | Freq: Two times a day (BID) | ORAL | 0 refills | Status: DC

## 2022-07-06 NOTE — ED Triage Notes (Signed)
Patient reports that she was having abdominal cramping and mid lower back pain. Patient also reports that she has been having irregular periods and ore frequent. Patient states she noticed last night she had blood tinged vaginal discharge .  Patient also reports that she is having intermittent mid chest pain.  Patient states she took Tylenol last night.

## 2022-07-06 NOTE — Discharge Instructions (Addendum)
patient she would like to have a referral for new OB/GYN recommended patient have another ultrasound follow-up if pain persist.  If she cannot get with her OB/GYN she will need to be seen in the emergency room for further testing Her urine and pregnancy test was negative today expressed that this may be her normal menstrual period occurring Discussed with patient starting on some letrozole to control her irregular bleeding bleeding she will need to follow-up with OB/GYN to continue this

## 2022-07-06 NOTE — ED Provider Notes (Signed)
[facility]    CSN: [id number] Arrival date & time: 07/06/22  7564      History   Chief Complaint No chief complaint on file.   HPI Barbara George is a 27 y.o. female.   Patient presents today with lower abdominal cramping irregular bleeding.  States that she had a regular.  Last month on the 17th.  Then has spotting in between.  States that this has been normal for several months she has seen her OB/GYN for this.  Patient has also had several ultrasounds for the same.  Patient states that there is no concern for STDs.  She is not on any type of birth control.  No nausea vomiting or diarrhea.  States she has intermittent chest pain and upper back pain at times none currently.  Took Tylenol with no relief.    Past Medical History:  Diagnosis Date   Bipolar 1 disorder ([place])    Depression    Diabetes mellitus without complication ([place])    Type 2 (Pre)   Schizophrenia ([place])     Patient Active Problem List   Diagnosis Date Noted   Hyperlipidemia 05/03/2021   Type II diabetes mellitus ([place]) 05/03/2021   Psychosis ([place]) 02/21/2020   Abnormal uterine bleeding 08/06/2019   Schizoaffective disorder, bipolar type ([place]) 10/23/2018   Bipolar I disorder, current or most recent episode manic, with psychotic features ([place]) 08/08/2016    History reviewed. No pertinent surgical history.  OB History     Gravida  0   Para  0   Term  0   Preterm  0   AB  0   Living  0      SAB  0   IAB  0   Ectopic  0   Multiple  0   Live Births  0            Home Medications    Prior to Admission medications   Medication Sig Start Date End Date Taking? Authorizing Provider  fluconazole (DIFLUCAN) 150 MG tablet Take the medicine for BV first and then take this medicine.  Take by mouth on Days 1, 4, and 7. 06/08/21   Burleson, Rona Ravens, NP  letrozole (FEMARA) 2.5 MG tablet Take 1 tablet (2.5 mg total) by mouth daily. Take on days 3 to 7 following a spontaneous  menses or progestin-induced bleed. 07/06/22   Marney Setting, NP  metFORMIN (GLUCOPHAGE) 500 MG tablet Take 500 mg by mouth 2 (two) times daily. 05/03/21   [provider]  metroNIDAZOLE (FLAGYL) 500 MG tablet Take 1 tablet (500 mg total) by mouth 2 (two) times daily. No alcohol while taking this medication 07/06/22   Marney Setting, NP  OLANZapine (ZYPREXA) 15 MG tablet Take 1 tablet (15 mg total) by mouth at bedtime. 05/15/21   Salley Slaughter, NP  rosuvastatin (CRESTOR) 5 MG tablet Take 5 mg by mouth daily. Patient not taking: Reported on 01/01/2022 05/03/21   [provider]    Family History Family History  Problem Relation Age of Onset   Hypertension Father     Social History Social History   Tobacco Use   Smoking status: Never   Smokeless tobacco: Never  Vaping Use   Vaping Use: Never used  Substance Use Topics   Alcohol use: No    Alcohol/week: 0.0 standard drinks of alcohol   Drug use: No     Allergies   Peanut-containing drug products and Pork-derived products  Review of Systems Review of Systems  Constitutional:  Negative for chills and fever.  Respiratory: Negative.    Cardiovascular:  Positive for chest pain.       Epigastric chest pain upper back pain mid back intermittently none currently  Gastrointestinal:  Positive for abdominal pain. Negative for nausea and vomiting.  Genitourinary:  Positive for hematuria, menstrual problem and pelvic pain. Negative for decreased urine volume, dysuria, enuresis, flank pain, frequency, vaginal bleeding, vaginal discharge and vaginal pain.     Physical Exam Triage Vital Signs ED Triage Vitals  Enc Vitals Group     BP 07/06/22 0928 123/82     Pulse Rate 07/06/22 0928 76     Resp 07/06/22 0928 16     Temp 07/06/22 0928 98.3 F (36.8 C)     Temp Source 07/06/22 0928 Oral     SpO2 07/06/22 0928 99 %     Weight --      Height --      Head Circumference --      Peak Flow --      Pain  Score 07/06/22 0933 6     Pain Loc --      Pain Edu? --      Excl. in [place]? --    No data found.  Updated Vital Signs BP 123/82 (BP Location: Left Arm)   Pulse 76   Temp 98.3 F (36.8 C) (Oral)   Resp 16   LMP 06/07/2022 (Approximate)   SpO2 99%   Visual Acuity Right Eye Distance:   Left Eye Distance:   Bilateral Distance:    Right Eye Near:   Left Eye Near:    Bilateral Near:     Physical Exam Constitutional:      Appearance: Normal appearance.  Cardiovascular:     Rate and Rhythm: Normal rate.     Pulses: Normal pulses.     Heart sounds: Normal heart sounds.  Pulmonary:     Effort: Pulmonary effort is normal.  Abdominal:     General: Abdomen is flat. Bowel sounds are normal.     Tenderness: There is abdominal tenderness. There is no right CVA tenderness or left CVA tenderness.  Skin:    General: Skin is warm.  Neurological:     Mental Status: She is alert.      UC Treatments / Results  Labs (all labs ordered are listed, but only abnormal results are displayed) Labs Reviewed  POCT URINALYSIS DIPSTICK, ED / UC - Abnormal; Notable for the following components:      Result Value   Hgb urine dipstick SMALL (*)    All other components within normal limits  POC URINE PREG, ED    EKG   Radiology No results found.  Procedures Procedures (including critical care time)  Medications Ordered in UC Medications - No data to display  Initial Impression / Assessment and Plan / UC Course  I have reviewed the triage vital signs and the nursing notes.  Pertinent labs & imaging results that were available during my care of the patient were reviewed by me and considered in my medical decision making (see chart for details).     Discussed with patient she would like to have a referral for new OB/GYN recommended patient have another ultrasound follow-up if pain persist.  If she cannot get with her OB/GYN she will need to be seen in the emergency room for further  testing Her urine and pregnancy test was negative today expressed that  this may be her normal menstrual period occurring Patient does have a peanut allergy and she is not able to take progesterone.  COntrol her irregular bleeding bleeding she will need to follow-up with OB/GYN to continue medication and refills.  Patient has been treated for BV in the past patient states that these are similar symptoms so we will send in a prescription she did not feel this was needed for testing at this time. Start back on previous dose of birth control that she has taken in the past for breakthrough bleeding. Final Clinical Impressions(s) / UC Diagnoses   Final diagnoses:  Lower abdominal pain  Irregular bleeding  Vaginal discharge     Discharge Instructions       patient she would like to have a referral for new OB/GYN recommended patient have another ultrasound follow-up if pain persist.  If she cannot get with her OB/GYN she will need to be seen in the emergency room for further testing Her urine and pregnancy test was negative today expressed that this may be her normal menstrual period occurring Discussed with patient starting on some progesterone to control her irregular bleeding bleeding she will need to follow-up with OB/GYN to continue this     ED Prescriptions     Medication Sig Dispense Auth. Provider   metroNIDAZOLE (FLAGYL) 500 MG tablet Take 1 tablet (500 mg total) by mouth 2 (two) times daily. No alcohol while taking this medication 14 tablet Morley Kos L, NP   letrozole [facility]) 2.5 MG tablet Take 1 tablet (2.5 mg total) by mouth daily. Take on days 3 to 7 following a spontaneous menses or progestin-induced bleed. 5 tablet Marney Setting, NP      PDMP not reviewed this encounter.   Marney Setting, NP 07/06/22 [phone]

## 2022-08-06 ENCOUNTER — Ambulatory Visit: Payer: Medicaid Other | Admitting: Obstetrics and Gynecology

## 2022-09-17 ENCOUNTER — Encounter: Payer: Self-pay | Admitting: Advanced Practice Midwife

## 2022-09-17 ENCOUNTER — Ambulatory Visit (INDEPENDENT_AMBULATORY_CARE_PROVIDER_SITE_OTHER): Payer: Medicaid Other | Admitting: Advanced Practice Midwife

## 2022-09-17 VITALS — BP 121/76 | HR 81 | Ht 68.0 in | Wt 235.6 lb

## 2022-09-17 DIAGNOSIS — Z01419 Encounter for gynecological examination (general) (routine) without abnormal findings: Secondary | ICD-10-CM

## 2022-09-17 DIAGNOSIS — E119 Type 2 diabetes mellitus without complications: Secondary | ICD-10-CM

## 2022-09-17 DIAGNOSIS — N926 Irregular menstruation, unspecified: Secondary | ICD-10-CM | POA: Diagnosis not present

## 2022-09-17 DIAGNOSIS — L739 Follicular disorder, unspecified: Secondary | ICD-10-CM | POA: Diagnosis not present

## 2022-09-17 DIAGNOSIS — L680 Hirsutism: Secondary | ICD-10-CM

## 2022-09-17 DIAGNOSIS — Z3169 Encounter for other general counseling and advice on procreation: Secondary | ICD-10-CM | POA: Diagnosis not present

## 2022-09-17 DIAGNOSIS — B372 Candidiasis of skin and nail: Secondary | ICD-10-CM

## 2022-09-17 DIAGNOSIS — Z7984 Long term (current) use of oral hypoglycemic drugs: Secondary | ICD-10-CM

## 2022-09-17 MED ORDER — NYSTATIN 100000 UNIT/GM EX CREA
1.0000 | TOPICAL_CREAM | Freq: Two times a day (BID) | CUTANEOUS | 1 refills | Status: DC
Start: 2022-09-17 — End: 2022-10-26

## 2022-09-17 NOTE — Progress Notes (Signed)
Subjective:     Barbara George is a 27 y.o. female here at [facility] for a routine exam.  Current complaints: bumps in genital area, irritation in groin area, irregular menses x 1 year that is now improved, hair growth on face and questions about removal options, and infertility.  Personal health questionnaire reviewed: yes.  Do you have a primary care provider? yes Do you feel safe at home? yes  Flowsheet Row Office Visit from 09/17/2022 in [facility] for [facility] at [facility] Total Score 1       Health Maintenance Due  Topic Date Due   COVID-19 Vaccine (1) Never done   FOOT EXAM  Never done   OPHTHALMOLOGY EXAM  Never done   HIV Screening  Never done   Diabetic kidney evaluation - Urine ACR  Never done   Hepatitis C Screening  Never done   Diabetic kidney evaluation - eGFR measurement  02/19/2021   HEMOGLOBIN A1C  07/04/2022     Risk factors for chronic health problems: Smoking: Alchohol/how much: Pt BMI: Body mass index is 35.82 kg/m.   Gynecologic History Patient's last menstrual period was 09/16/2022. Contraception: none Last Pap: 03/16/21. Results were: normal Last mammogram: n/a.   Obstetric History OB History  Gravida Para Term Preterm AB Living  0 0 0 0 0 0  SAB IAB Ectopic Multiple Live Births  0 0 0 0 0     The following portions of the patient's history were reviewed and updated as appropriate: allergies, current medications, past family history, past medical history, past social history, past surgical history, and problem list.  Review of Systems Pertinent items noted in HPI and remainder of comprehensive ROS otherwise negative.    Objective:  BP 121/76   Pulse 81   Ht  (1.727 m)   Wt 235 lb 9.6 oz (106.9 kg)   LMP 09/16/2022   BMI 35.82 kg/m   VS reviewed, nursing note reviewed,  Constitutional: well developed, well nourished, no distress HEENT: normocephalic, thyroid without enlargement or  mass HEART: RRR, no murmurs rubs/gallops RESP: clear and equal to auscultation bilaterally in all lobes  Breast Exam:  Deferred with low risks and shared decision making, recommend  self breast exams at home, discussed recommendation to start mammogram between 40-50 yo/  Abdomen: soft Neuro: alert and oriented x 3 Skin: warm, dry Psych: affect normal Pelvic exam: Deferred     Assessment/Plan:   1. Well woman exam with routine gynecological exam   2. Type 2 diabetes mellitus without complication, without long-term current use of insulin --Taking Metformin, eating healthier diet and exercising at the gym with her husband.  3. Infertility counseling --Pt husband with low sperm count and is following up with fertility specialist after treatments. --Pt to try ovulation kit for next 2-3 months, f/u with MD  here in the office. --Check thyroid panel given hx irregular menses, and TSH low x 1 3 years ago, then wnl.  - TSH - T3, free - T4, free  4. Irregular menstrual cycle  - TSH - T3, free - T4, free - FSH - LH - Prolactin - Beta hCG quant (ref lab)  5. Folliculitis --No physical exam performed today per pt.  Discussion of symptoms with small bumps that last 1-2 days, in areas where patient shaves.  Likely folliculitis.  Warm compresses, baths with Epsom salt.  Keep clean and dry but do not overwash with harsh soaps.  6. Candida infection  of flexural skin --Groin area symptoms c/w yeast, so will treat with topical medication. --Pt to f/u if not improved - nystatin cream (MYCOSTATIN); Apply 1 Application topically 2 (two) times daily.  Dispense: 30 g; Refill: 1  7. Female hirsutism --discussed hair removal options including depilatory creams, waxing, shaving, electrolysis, and laser hair removal.   - Testosterone     Return in about 3 months (around 12/17/2022) for MD only, Female provider preferred.   Sharen Counter, CNM 4:14 PM

## 2022-09-17 NOTE — Progress Notes (Signed)
Pt presents for dysmenorrhea. Pain 6/10. Pt reports severe cramps, clots and mood swings. Using 3 days a day. Pt has been trying to conceive for 5 years. Declines STD testing.

## 2022-09-18 LAB — PROLACTIN: Prolactin: 4.6 ng/mL — ABNORMAL LOW (ref 4.8–33.4)

## 2022-09-18 LAB — TSH: TSH: 0.86 u[IU]/mL (ref 0.450–4.500)

## 2022-09-18 LAB — LUTEINIZING HORMONE: LH: 4.4 m[IU]/mL

## 2022-09-18 LAB — BETA HCG QUANT (REF LAB): hCG Quant: <1 m[IU]/mL

## 2022-09-18 LAB — FOLLICLE STIMULATING HORMONE: FSH: 5.3 m[IU]/mL

## 2022-09-18 LAB — T3, FREE: T3, Free: 3.1 pg/mL (ref 2.0–4.4)

## 2022-09-18 LAB — T4, FREE: Free T4: 1.18 ng/dL (ref 0.82–1.77)

## 2022-09-18 LAB — TESTOSTERONE: Testosterone: 26 ng/dL (ref 13–71)

## 2022-09-20 ENCOUNTER — Other Ambulatory Visit (HOSPITAL_BASED_OUTPATIENT_CLINIC_OR_DEPARTMENT_OTHER): Payer: Self-pay | Admitting: Advanced Practice Midwife

## 2022-09-20 DIAGNOSIS — L680 Hirsutism: Secondary | ICD-10-CM

## 2022-09-20 MED ORDER — SPIRONOLACTONE 50 MG PO TABS
50.0000 mg | ORAL_TABLET | Freq: Two times a day (BID) | ORAL | 2 refills | Status: DC
Start: 2022-09-20 — End: 2022-12-11

## 2022-09-30 LAB — PROLACTIN

## 2022-09-30 LAB — FOLLICLE STIMULATING HORMONE

## 2022-09-30 LAB — BETA HCG QUANT (REF LAB)

## 2022-09-30 LAB — TSH

## 2022-09-30 LAB — TESTOSTERONE

## 2022-09-30 LAB — T4, FREE

## 2022-09-30 LAB — T3, FREE

## 2022-09-30 LAB — LUTEINIZING HORMONE

## 2022-10-26 ENCOUNTER — Other Ambulatory Visit: Payer: Self-pay

## 2022-10-26 ENCOUNTER — Ambulatory Visit (HOSPITAL_COMMUNITY)
Admission: EM | Admit: 2022-10-26 | Discharge: 2022-10-26 | Disposition: A | Discharge status: Home / Self Care | Payer: Medicaid Other | Attending: Family Medicine | Admitting: Family Medicine

## 2022-10-26 ENCOUNTER — Encounter (HOSPITAL_COMMUNITY): Payer: Self-pay | Admitting: Emergency Medicine

## 2022-10-26 DIAGNOSIS — Z23 Encounter for immunization: Secondary | ICD-10-CM

## 2022-10-26 DIAGNOSIS — L910 Hypertrophic scar: Secondary | ICD-10-CM

## 2022-10-26 DIAGNOSIS — S61210A Laceration without foreign body of right index finger without damage to nail, initial encounter: Secondary | ICD-10-CM | POA: Diagnosis not present

## 2022-10-26 MED ORDER — TETANUS-DIPHTH-ACELL PERTUSSIS 5-2.5-18.5 LF-MCG/0.5 IM SUSY
PREFILLED_SYRINGE | INTRAMUSCULAR | Status: AC
  Filled 2022-10-26: qty 0.5

## 2022-10-26 MED ORDER — TETANUS-DIPHTH-ACELL PERTUSSIS 5-2.5-18.5 LF-MCG/0.5 IM SUSY
0.5000 mL | PREFILLED_SYRINGE | Freq: Once | INTRAMUSCULAR | Status: AC
  Administered 2022-10-26: 0.5 mL via INTRAMUSCULAR

## 2022-10-26 MED ORDER — MUPIROCIN 2 % EX OINT: 1.0000 | TOPICAL_OINTMENT | Freq: Two times a day (BID) | CUTANEOUS | 0 refills | Status: DC

## 2022-10-26 NOTE — Discharge Instructions (Signed)
  Wash your cut with soapy water twice daily and apply new antibiotic ointment and a new bandage.  Keep it covered to protect the cut for the next few days at least.  Put mupirocin ointment on the cut twice daily until improved  You have been given a Tdap vaccination to boost your tetanus immunity

## 2022-10-26 NOTE — ED Triage Notes (Signed)
Pt here for laceration to right 2nd finger. Reports pushed trash down and cut on something metal last night.  Laceration to distal finger noted with bleeding controlled.  Pt also would like "swollen area" on right ear near piercing looked at.  This has been present since november and at times is painful when sleeping on that side.  Area to ear similar in appearance to small keloid, no redness or drainage noted.

## 2022-10-26 NOTE — ED Provider Notes (Signed)
[facility]    CSN: [id number] Arrival date & time: 10/26/22  0840      History   Chief Complaint Chief Complaint  Patient presents with   Laceration    HPI Barbara George is a 27 y.o. female.    Laceration  For a cut on her right index finger.  Last evening about 11 or 12 hours ago she was pushing some trash down in the can and cut her finger, on a tin can.  She did apply a bandage and comes in today for [place] to look at the wound.  Also she has a bump on the edge of her right external ear that is been there for about 4 or 5 months, and is where the piercing was placed about 6 months ago.     Past Medical History:  Diagnosis Date   Bipolar 1 disorder ([facility])    Depression    Diabetes mellitus without complication ([facility])    Type 2 (Pre)   Schizophrenia ([facility])     Patient Active Problem List   Diagnosis Date Noted   Irregular menstrual cycle 09/17/2022   Hyperlipidemia 05/03/2021   Type II diabetes mellitus ([facility]) 05/03/2021   Psychosis ([facility]) 02/21/2020   Abnormal uterine bleeding 08/06/2019   Schizoaffective disorder, bipolar type ([facility]) 10/23/2018   Bipolar I disorder, current or most recent episode manic, with psychotic features ([facility]) 08/08/2016    History reviewed. No pertinent surgical history.  OB History     Gravida  0   Para  0   Term  0   Preterm  0   AB  0   Living  0      SAB  0   IAB  0   Ectopic  0   Multiple  0   Live Births  0            Home Medications    Prior to Admission medications   Medication Sig Start Date End Date Taking? Authorizing Provider  metFORMIN (GLUCOPHAGE) 500 MG tablet Take 500 mg by mouth 2 (two) times daily. 05/03/21  Yes [provider]  mupirocin ointment (BACTROBAN) 2 % Apply 1 Application topically 2 (two) times daily. To affected area till better 10/26/22  Yes Daron Breeding, Janace Aris, MD  OLANZapine (ZYPREXA) 15 MG tablet Take 1 tablet (15 mg total) by mouth at bedtime.  05/15/21  Yes Toy Cookey E, NP  letrozole [facility]) 2.5 MG tablet Take 1 tablet (2.5 mg total) by mouth daily. Take on days 3 to 7 following a spontaneous menses or progestin-induced bleed. Patient not taking: Reported on 09/17/2022 07/06/22   Coralyn Mark, NP  rosuvastatin (CRESTOR) 5 MG tablet Take 5 mg by mouth daily. Patient not taking: Reported on 01/01/2022 05/03/21   [provider]  spironolactone (ALDACTONE) 50 MG tablet Take 1 tablet (50 mg total) by mouth 2 (two) times daily. Will start with 50 mg twice daily, and increase to 100 mg twice daily as needed 09/20/22   Leftwich-Kirby, Wilmer Floor, CNM    Family History Family History  Problem Relation Age of Onset   Hypertension Father     Social History Social History   Tobacco Use   Smoking status: Never   Smokeless tobacco: Never  Vaping Use   Vaping Use: Never used  Substance Use Topics   Alcohol use: No    Alcohol/week: 0.0 standard drinks of alcohol   Drug use: No     Allergies  Peanut-containing drug products and Pork-derived products   Review of Systems Review of Systems   Physical Exam Triage Vital Signs ED Triage Vitals  Enc Vitals Group     BP 10/26/22 0926 123/85     Pulse Rate 10/26/22 0926 76     Resp 10/26/22 0926 18     Temp 10/26/22 0926 99 F (37.2 C)     Temp Source 10/26/22 0926 Oral     SpO2 10/26/22 0926 97 %     Weight 10/26/22 0920 230 lb (104.3 kg)     Height 10/26/22 0920 5\' 8"  (1.727 m)     Head Circumference --      Peak Flow --      Pain Score 10/26/22 0919 5     Pain Loc --      Pain Edu? --      Excl. in [place]? --    No data found.  Updated Vital Signs BP 123/85   Pulse 76   Temp 99 F (37.2 C) (Oral)   Resp 18   Ht 5\' 8"  (1.727 m)   Wt 104.3 kg   LMP 10/22/2022   SpO2 97%   BMI 34.97 kg/m   Visual Acuity Right Eye Distance:   Left Eye Distance:   Bilateral Distance:    Right Eye Near:   Left Eye Near:    Bilateral Near:     Physical  Exam Vitals reviewed.  Constitutional:      General: She is not in acute distress.    Appearance: She is not ill-appearing, toxic-appearing or diaphoretic.  HENT:     Ears:     Comments: On the upper helix at a piercing site there is a bump that is fairly firm and pink.  It is about 3 mm in diameter.  It is not fluctuant. Skin:    Coloration: Skin is not jaundiced or pale.     Comments: There is a 1.5 cm long linear laceration on the ulnar aspect of the right index finger pad.  Skin edges are stuck together and not gaping.  There is a little swelling over the finger pad.  There is no erythema.  There is no drainage.  Range of motion of the right index finger is normal.  Neurological:     Mental Status: She is alert and oriented to person, place, and time.  Psychiatric:        Behavior: Behavior normal.      UC Treatments / Results  Labs (all labs ordered are listed, but only abnormal results are displayed) Labs Reviewed - No data to display  EKG   Radiology No results found.  Procedures Procedures (including critical care time)  Medications Ordered in UC Medications  Tdap (BOOSTRIX) injection 0.5 mL (has no administration in time range)    Initial Impression / Assessment and Plan / UC Course  I have reviewed the triage vital signs and the nursing notes.  Pertinent labs & imaging results that were available during my care of the patient were reviewed by me and considered in my medical decision making (see chart for details).        Bactroban is sent in for her to use.  Wound care is explained.  At this time his laceration does not need suturing as the skin edges are approximated on their own and do not pull apart on examination.  She was unsure of when her last tetanus was, but I do see a Tdap in 2016.  Since this occurred when she was manipulating things in her trash can, and it has been over 5 years since a tetanus booster, we will booster tetanus today.  Have  asked her to follow-up with her primary care about the possible keloid on her external ear.  Final Clinical Impressions(s) / UC Diagnoses   Final diagnoses:  Laceration of right index finger without foreign body without damage to nail, initial encounter  Keloid     Discharge Instructions       Wash your cut with soapy water twice daily and apply new antibiotic ointment and a new bandage.  Keep it covered to protect the cut for the next few days at least.  Put mupirocin ointment on the cut twice daily until improved  You have been given a Tdap vaccination to boost your tetanus immunity      ED Prescriptions     Medication Sig Dispense Auth. Provider   mupirocin ointment (BACTROBAN) 2 % Apply 1 Application topically 2 (two) times daily. To affected area till better 22 g Marlinda Mike Janace Aris, MD      PDMP not reviewed this encounter.   Zenia Resides, MD 10/26/22 [phone]

## 2022-12-10 ENCOUNTER — Ambulatory Visit (INDEPENDENT_AMBULATORY_CARE_PROVIDER_SITE_OTHER): Payer: MEDICAID | Admitting: Licensed Clinical Social Worker

## 2022-12-10 DIAGNOSIS — F250 Schizoaffective disorder, bipolar type: Secondary | ICD-10-CM
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
## 2022-12-11 ENCOUNTER — Encounter (HOSPITAL_COMMUNITY): Payer: Self-pay

## 2022-12-11 ENCOUNTER — Encounter (HOSPITAL_COMMUNITY): Payer: Self-pay | Admitting: Student

## 2022-12-11 ENCOUNTER — Other Ambulatory Visit: Payer: Self-pay

## 2022-12-11 ENCOUNTER — Ambulatory Visit (HOSPITAL_COMMUNITY)
Admission: EM | Admit: 2022-12-11 | Discharge: 2022-12-11 | Disposition: A | Discharge status: Other Acute Inpatient Hospital | Payer: MEDICAID | Attending: Psychiatry | Admitting: Psychiatry

## 2022-12-11 ENCOUNTER — Inpatient Hospital Stay (HOSPITAL_COMMUNITY)
Admission: AD | Admit: 2022-12-11 | Discharge: 2022-12-21 | DRG: 885 | Disposition: A | Discharge status: Home / Self Care | Payer: MEDICAID | Source: Intra-hospital | Attending: Psychiatry | Admitting: Psychiatry

## 2022-12-11 ENCOUNTER — Ambulatory Visit (INDEPENDENT_AMBULATORY_CARE_PROVIDER_SITE_OTHER): Payer: MEDICAID | Admitting: Student

## 2022-12-11 VITALS — BP 143/86 | HR 94 | Ht 68.0 in | Wt 237.0 lb

## 2022-12-11 DIAGNOSIS — Z79899 Other long term (current) drug therapy: Secondary | ICD-10-CM

## 2022-12-11 DIAGNOSIS — F250 Schizoaffective disorder, bipolar type: Principal | ICD-10-CM | POA: Diagnosis present

## 2022-12-11 DIAGNOSIS — G4700 Insomnia, unspecified: Secondary | ICD-10-CM | POA: Diagnosis not present

## 2022-12-11 DIAGNOSIS — Z560 Unemployment, unspecified: Secondary | ICD-10-CM

## 2022-12-11 DIAGNOSIS — F419 Anxiety disorder, unspecified: Secondary | ICD-10-CM | POA: Diagnosis not present

## 2022-12-11 DIAGNOSIS — Z91148 Patient's other noncompliance with medication regimen for other reason: Secondary | ICD-10-CM | POA: Insufficient documentation

## 2022-12-11 DIAGNOSIS — R440 Auditory hallucinations: Secondary | ICD-10-CM | POA: Diagnosis present

## 2022-12-11 DIAGNOSIS — Z91199 Patient's noncompliance with other medical treatment and regimen due to unspecified reason: Secondary | ICD-10-CM

## 2022-12-11 DIAGNOSIS — E119 Type 2 diabetes mellitus without complications: Secondary | ICD-10-CM | POA: Diagnosis present

## 2022-12-11 LAB — COMPREHENSIVE METABOLIC PANEL
ALT: 14 U/L (ref 0–44)
AST: 18 U/L (ref 15–41)
Albumin: 3.6 g/dL (ref 3.5–5.0)
Alkaline Phosphatase: 61 U/L (ref 38–126)
Anion gap: 9 (ref 5–15)
BUN: <5 mg/dL — ABNORMAL LOW (ref 6–20)
CO2: 26 mmol/L (ref 22–32)
Calcium: 10.1 mg/dL (ref 8.9–10.3)
Chloride: 103 mmol/L (ref 98–111)
Creatinine, Ser: 0.73 mg/dL (ref 0.44–1.00)
GFR, Estimated: >60 mL/min (ref >60)
Glucose, Bld: 95 mg/dL (ref 70–99)
Potassium: 4.4 mmol/L (ref 3.5–5.1)
Sodium: 138 mmol/L (ref 135–145)
Total Bilirubin: 0.4 mg/dL (ref 0.3–1.2)
Total Protein: 7.5 g/dL (ref 6.5–8.1)

## 2022-12-11 LAB — CBC WITH DIFFERENTIAL/PLATELET
Abs Immature Granulocytes: 0.01 10*3/uL (ref 0.00–0.07)
Basophils Absolute: 0 10*3/uL (ref 0.0–0.1)
Basophils Relative: 1 %
Eosinophils Absolute: 0.1 10*3/uL (ref 0.0–0.5)
Eosinophils Relative: 2 %
HCT: 37 % (ref 36.0–46.0)
Hemoglobin: 11.4 g/dL — ABNORMAL LOW (ref 12.0–15.0)
Immature Granulocytes: 0 %
Lymphocytes Relative: 41 %
Lymphs Abs: 2.7 10*3/uL (ref 0.7–4.0)
MCH: 23.6 pg — ABNORMAL LOW (ref 26.0–34.0)
MCHC: 30.8 g/dL (ref 30.0–36.0)
MCV: 76.6 fL — ABNORMAL LOW (ref 80.0–100.0)
Monocytes Absolute: 0.5 10*3/uL (ref 0.1–1.0)
Monocytes Relative: 7 %
Neutro Abs: 3.3 10*3/uL (ref 1.7–7.7)
Neutrophils Relative %: 49 %
Platelets: 405 10*3/uL — ABNORMAL HIGH (ref 150–400)
RBC: 4.83 MIL/uL (ref 3.87–5.11)
RDW: 15.2 % (ref 11.5–15.5)
WBC: 6.6 10*3/uL (ref 4.0–10.5)
nRBC: 0 % (ref 0.0–0.2)

## 2022-12-11 LAB — HEMOGLOBIN A1C
Hgb A1c MFr Bld: 6.2 % — ABNORMAL HIGH (ref 4.8–5.6)
Mean Plasma Glucose: 131 mg/dL

## 2022-12-11 LAB — TSH: TSH: 0.592 u[IU]/mL (ref 0.350–4.500)

## 2022-12-11 LAB — ETHANOL: Alcohol, Ethyl (B): <10 mg/dL (ref <10)

## 2022-12-11 LAB — LIPID PANEL
Cholesterol: 228 mg/dL — ABNORMAL HIGH (ref 0–200)
HDL: 91 mg/dL (ref >40)
LDL Cholesterol: 113 mg/dL — ABNORMAL HIGH (ref 0–99)
Total CHOL/HDL Ratio: 2.5 RATIO
Triglycerides: 122 mg/dL (ref <150)
VLDL: 24 mg/dL (ref 0–40)

## 2022-12-11 MED ORDER — ACETAMINOPHEN 325 MG PO TABS
650.0000 mg | ORAL_TABLET | Freq: Four times a day (QID) | ORAL | Status: DC | PRN
  Administered 2022-12-12 – 2022-12-17 (×3): 650 mg via ORAL
  Filled 2022-12-11 (×3): qty 2

## 2022-12-11 MED ORDER — HYDROXYZINE HCL 25 MG PO TABS
25.0000 mg | ORAL_TABLET | Freq: Three times a day (TID) | ORAL | Status: DC | PRN
  Administered 2022-12-12 – 2022-12-13 (×2): 25 mg via ORAL
  Filled 2022-12-11 (×2): qty 1

## 2022-12-11 MED ORDER — LORAZEPAM 1 MG PO TABS
2.0000 mg | ORAL_TABLET | Freq: Three times a day (TID) | ORAL | Status: DC | PRN
  Administered 2022-12-12 – 2022-12-20 (×5): 2 mg via ORAL
  Filled 2022-12-11 (×6): qty 2

## 2022-12-11 MED ORDER — LORAZEPAM 1 MG PO TABS: 1.0000 mg | ORAL_TABLET | ORAL | Status: DC | PRN

## 2022-12-11 MED ORDER — MAGNESIUM HYDROXIDE 400 MG/5ML PO SUSP: 30.0000 mL | Freq: Every day | ORAL | Status: DC | PRN

## 2022-12-11 MED ORDER — HALOPERIDOL LACTATE 5 MG/ML IJ SOLN: 5.0000 mg | Freq: Three times a day (TID) | INTRAMUSCULAR | Status: DC | PRN

## 2022-12-11 MED ORDER — ZIPRASIDONE MESYLATE 20 MG IM SOLR: 20.0000 mg | INTRAMUSCULAR | Status: DC | PRN

## 2022-12-11 MED ORDER — LORAZEPAM 2 MG/ML IJ SOLN: 2.0000 mg | Freq: Three times a day (TID) | INTRAMUSCULAR | Status: DC | PRN

## 2022-12-11 MED ORDER — OLANZAPINE 5 MG PO TBDP
5.0000 mg | ORAL_TABLET | Freq: Three times a day (TID) | ORAL | Status: DC | PRN
  Administered 2022-12-11: 5 mg via ORAL
  Filled 2022-12-11: qty 1

## 2022-12-11 MED ORDER — DIPHENHYDRAMINE HCL 50 MG/ML IJ SOLN: 50.0000 mg | Freq: Three times a day (TID) | INTRAMUSCULAR | Status: DC | PRN

## 2022-12-11 MED ORDER — ACETAMINOPHEN 325 MG PO TABS: 650.0000 mg | ORAL_TABLET | Freq: Four times a day (QID) | ORAL | Status: DC | PRN

## 2022-12-11 MED ORDER — TRAZODONE HCL 50 MG PO TABS
50.0000 mg | ORAL_TABLET | Freq: Every evening | ORAL | Status: DC | PRN
  Administered 2022-12-12: 50 mg via ORAL
  Filled 2022-12-11: qty 1

## 2022-12-11 MED ORDER — ALUM & MAG HYDROXIDE-SIMETH 200-200-20 MG/5ML PO SUSP: 30.0000 mL | ORAL | Status: DC | PRN

## 2022-12-11 MED ORDER — ALUM & MAG HYDROXIDE-SIMETH 200-200-20 MG/5ML PO SUSP
30.0000 mL | ORAL | Status: DC | PRN
  Administered 2022-12-16: 30 mL via ORAL
  Filled 2022-12-11: qty 30

## 2022-12-11 MED ORDER — DIPHENHYDRAMINE HCL 25 MG PO CAPS
50.0000 mg | ORAL_CAPSULE | Freq: Three times a day (TID) | ORAL | Status: DC | PRN
  Administered 2022-12-12 – 2022-12-20 (×5): 50 mg via ORAL
  Filled 2022-12-11 (×5): qty 2

## 2022-12-11 MED ORDER — HALOPERIDOL 5 MG PO TABS
5.0000 mg | ORAL_TABLET | Freq: Three times a day (TID) | ORAL | Status: DC | PRN
  Administered 2022-12-12 – 2022-12-20 (×5): 5 mg via ORAL
  Filled 2022-12-11 (×5): qty 1

## 2022-12-11 MED ORDER — RISPERIDONE 2 MG PO TBDP
2.0000 mg | ORAL_TABLET | Freq: Every day | ORAL | Status: DC
  Administered 2022-12-11: 2 mg via ORAL
  Filled 2022-12-11: qty 1

## 2022-12-11 NOTE — Tx Team (Signed)
Initial Treatment Plan 12/11/2022 9:37 PM [name] George XBJ:[id number]    PATIENT STRESSORS: Medication change or noncompliance     PATIENT STRENGTHS: Supportive family/friends    PATIENT IDENTIFIED PROBLEMS: "I'm just here to fill my prescriptions."                     DISCHARGE CRITERIA:  Improved stabilization in mood, thinking, and/or behavior Verbal commitment to aftercare and medication compliance  PRELIMINARY DISCHARGE PLAN: Return to previous living arrangement  PATIENT/FAMILY INVOLVEMENT: This treatment plan has been presented to and reviewed with the patient, Barbara George. The patient has been given the opportunity to ask questions and make suggestions.  Gardiner Barefoot, RN 12/11/2022, 9:37 PM

## 2022-12-11 NOTE — ED Provider Notes (Signed)
FBC/OBS ASAP Discharge Summary  Date and Time: 12/11/2022 4:36 PM  Name: Barbara George  MRN:  [id number]   Discharge Diagnoses:  Final diagnoses:  Schizoaffective disorder, bipolar type (HCC)    Subjective: Subjective:  Barbara George is a 27 year old female with prior medical history of schizoaffective disorder, bipolar type with multiple previous psychiatric hospitalizations, that presents to the Behavioral Health Urgent Care with father and husband due to concerns of decreased sleeping, elevated energy, engagment in multiple tasks, disorgznized thoughts and speech. Abnormal behavior had been going on for the past 3-4 weeks after intermittently taking her home Zyprexa. Patient was seen sitting calmly in the exam room and was pleasant and easily distracted during the interview. Throughout the interview, the patient would interrupt the writer with unrelated questions. When questioned directly the patient's responses were often tangential or unrelated to the question asked. Father reports that she stopped taking her medications because she was feeling better, disliked that it made her gain weight, and wants to get pregnant. After stopping her medications the patient has had decreased sleep, less than 4 hours a night.    Father expressed this has happened several times previously where she stops her medications, begins behaving abnormally, and results in her having an inpatient psychiatric treatment. He notes that she improves after each inpatient treatment, but does not adhere to her medications.   Patient was previously prescribed Zyprexa 15 mg nightly in December 2022 by Toy Cookey, DNP, but has been noncompliant with this regimen once feeling better.  Over the past 3 to 4 weeks, patient has been taking the medication 2 to 3 days/week, without improvement.  She was also previously on Abilify Maintena LAI from 2018-2020, to which she responded well.  Patient is actively attempting to get  pregnant and would like a medication that would not put that pregnancy at risk or increase her possibility of weight gain. We discussed the possibility  of  starting risperidone which the patient was agreeable with. Discussed with patient and family the need for inpatient care to stabilize patient, and they are all in agreement.  As patient is not necessarily a danger to herself nor others, recommend admission to the Behavioral unit and possible inpatient medication stabilization for symptomatic improvement.    Patient denies SI/HI/AVH.  Stay Summary:   The patient was evaluated each day by a clinical provider to ascertain response to treatment. Improvement was noted by the patient's report of decreasing symptoms, improved sleep and appetite, affect, medication tolerance, behavior, and participation in unit programming.  Patient was asked each day to complete a self inventory noting mood, mental status, pain, new symptoms, anxiety and concerns.  The patient's medications were managed with the following directions: Discontinued zyprexa 15 mg at bedtime Started Risperidone 2 mg every day    Patient responded well to medication and being in a therapeutic and supportive environment. Positive and appropriate behavior was noted and the patient was motivated for recovery. The patient worked closely with the treatment team and case manager to develop a discharge plan with appropriate goals. Coping skills, problem solving as well as relaxation therapies were also part of the unit programming.   Total Time spent with patient: 30 minutes  Past Psychiatric History: Schizoaffective disorder  Past Medical History: Prediabetes  Family History: N/A Family Psychiatric  History: Unknown psychiatric illness in great uncle on paternal side Social History: Lives with her husband, and the husbands nephew. Graduated highschool. Does not currently work. Denies any EtOH and  substance use.  Tobacco Cessation:  N/A,  patient does not currently use tobacco products  Current Medications:  Current Facility-Administered Medications  Medication Dose Route Frequency Provider Last Rate Last Admin   acetaminophen (TYLENOL) tablet 650 mg  650 mg Oral Q6H PRN Peterson Ao, MD       alum & mag hydroxide-simeth (MAALOX/MYLANTA) 200-200-20 MG/5ML suspension 30 mL  30 mL Oral Q4H PRN Peterson Ao, MD       OLANZapine zydis (ZYPREXA) disintegrating tablet 5 mg  5 mg Oral Q8H PRN Peterson Ao, MD   5 mg at 12/11/22 1534   And   LORazepam (ATIVAN) tablet 1 mg  1 mg Oral PRN Peterson Ao, MD       And   ziprasidone (GEODON) injection 20 mg  20 mg Intramuscular PRN Peterson Ao, MD       magnesium hydroxide (MILK OF MAGNESIA) suspension 30 mL  30 mL Oral Daily PRN Peterson Ao, MD       risperiDONE (RISPERDAL M-TABS) disintegrating tablet 2 mg  2 mg Oral Daily Peterson Ao, MD   2 mg at 12/11/22 1148   Current Outpatient Medications  Medication Sig Dispense Refill   OLANZapine (ZYPREXA) 15 MG tablet Take 1 tablet (15 mg total) by mouth at bedtime. 30 tablet 3    PTA Medications:  Facility Ordered Medications  Medication   acetaminophen (TYLENOL) tablet 650 mg   alum & mag hydroxide-simeth (MAALOX/MYLANTA) 200-200-20 MG/5ML suspension 30 mL   magnesium hydroxide (MILK OF MAGNESIA) suspension 30 mL   OLANZapine zydis (ZYPREXA) disintegrating tablet 5 mg   And   LORazepam (ATIVAN) tablet 1 mg   And   ziprasidone (GEODON) injection 20 mg   risperiDONE (RISPERDAL M-TABS) disintegrating tablet 2 mg   PTA Medications  Medication Sig   OLANZapine (ZYPREXA) 15 MG tablet Take 1 tablet (15 mg total) by mouth at bedtime.       12/10/2022    8:03 AM 09/17/2022    1:18 PM 07/24/2021    3:15 PM  Depression screen PHQ 2/9  Decreased Interest 0 1 0  Down, Depressed, Hopeless 3 0 0  PHQ - 2 Score 3 1 0  Altered sleeping 3 0   Tired, decreased energy 1 1   Change in appetite 1 0   Feeling bad or  failure about yourself  1 0   Trouble concentrating 2 0   Moving slowly or fidgety/restless 2 0   Suicidal thoughts 0 0   PHQ-9 Score 13 2   Difficult doing work/chores Very difficult      Flowsheet Row ED from 12/11/2022 in [facility] Counselor from 12/10/2022 in [facility] ED from 10/26/2022 in [facility] RISK CATEGORY No Risk No Risk No Risk       Musculoskeletal  Strength & Muscle Tone: within normal limits Gait & Station: normal Patient leans: N/A  Psychiatric Specialty Exam  Presentation  General Appearance:  Appropriate for Environment; Well Groomed  Eye Contact: Fair  Speech: Pressured; Garbled  Speech Volume: Increased  Handedness:No data recorded  Mood and Affect  Mood: Euphoric  Affect: Full Range; Congruent   Thought Process  Thought Processes: Disorganized  Descriptions of Associations:Loose  Orientation:Partial (Oriented to person only)  Thought Content:Scattered; Delusions; Illogical  Diagnosis of Schizophrenia or Schizoaffective disorder in past: Yes  Duration of Psychotic Symptoms: Less than six months   Hallucinations:Hallucinations: None  Ideas of Reference:None  Suicidal Thoughts:Suicidal Thoughts: No  Homicidal Thoughts:Homicidal Thoughts: No   Sensorium  Memory: Immediate Poor; Recent Poor  Judgment: Poor  Insight: Poor   Executive Functions  Concentration: Poor  Attention Span: Poor  Recall: Poor  Fund of Knowledge: Fair  Language: Good   Psychomotor Activity  Psychomotor Activity: Psychomotor Activity: Normal   Assets  Assets: Social Support   Sleep  Sleep: Sleep: Poor   Nutritional Assessment (For OBS and FBC admissions only) Has the patient had a weight loss or gain of 10 pounds or more in the last 3 months?: No Has the patient had a decrease in food intake/or appetite?: Yes Does the patient  have dental problems?: No Does the patient have eating habits or behaviors that may be indicators of an eating disorder including binging or inducing vomiting?: No Has the patient recently lost weight without trying?: 0 Has the patient been eating poorly because of a decreased appetite?: 1 Malnutrition Screening Tool Score: 1    Physical Exam  Physical Exam ROS Blood pressure 124/78, pulse 80, temperature 98.4 F (36.9 C), resp. rate 18, SpO2 100 %. There is no height or weight on file to calculate BMI.  Demographic Factors:  Unemployed  Loss Factors: NA  Historical Factors: Family history of mental illness or substance abuse and Impulsivity  Risk Reduction Factors:   Sense of responsibility to family, Religious beliefs about death, Living with another person, especially a relative, and Positive social support  Continued Clinical Symptoms:  Schizoaffective, bipolar type, currently manic   Cognitive Features That Contribute To Risk:  Loss of executive function    Suicide Risk:  Mild:  Suicidal ideation of limited frequency, intensity, duration, and specificity.  There are no identifiable plans, no associated intent, mild dysphoria and related symptoms, good self-control (both objective and subjective assessment), few other risk factors, and identifiable protective factors, including available and accessible social support.  Plan Of Care/Follow-up recommendations:  Adrina Midou Hamadou is a 27 y.o. woman with a PMHx of Schizoaffective disorder bipolar type. Who was evaluated in the [facility] Urgent care after presenting to clinic experiencing a manic episode. After evaluating the patient alongside, Dr. Viviano Simas, and her history of medication noncompliance, I believe the patient will benefit from inpatient treatment and medication management and symptomatic stabilization. The patient is agreeable to admission on to the obs unit with possible  inpatient medication  management and additional services at a behavioral hospital. We will start the patient on Risperidone to help treat the patients psychotic symptoms. Currently awaiting bed placement and transfer to the [facility] today or tomorrow morning.     Plan:   # Schizoaffective disorder, bipolar type Past medication trials: Abilify Maintena, Zyprexa Status of problem: Current manic episode with psychotic features Interventions: -- [facility] observation unit, transfer to inpatient [facility]  -- Risperidone 2 mg daily ordered -- Agitation PRNs ordered   #Elevated BP without diagnosis of HTN Interventions: -- Advised to schedule appointment with PCP  Disposition: Admit to obs unit, transfer to inpatient [facility] pending bed availability   Peterson Ao, MD 12/11/2022, 4:36 PM

## 2022-12-11 NOTE — Discharge Instructions (Signed)
Follow-up recommendations:  Activity:  Normal, as tolerated Diet:  Per PCP recommendation  Patient is instructed prior to discharge to: Take all medications as prescribed by her mental healthcare provider. Report any adverse effects and/or reactions from the medicines to her outpatient provider promptly. Patient has been instructed & cautioned: To not engage in alcohol and or illegal drug use while on prescription medicines.  In the event of worsening symptoms, patient is instructed to call the crisis hotline at 988, 911 and or go to the nearest ED for appropriate evaluation and treatment of symptoms. To follow-up with her primary care provider for your other medical issues, concerns and or health care needs.  

## 2022-12-11 NOTE — Progress Notes (Signed)
   12/11/22 0956  [facility] Triage Screening (Walk-ins at [facility] only)  How Did You Hear About [place]? Other (Comment) (referred by Dr. Martie George. Barbara George)  What Is the Reason for Your Visit/Call Today? Barbara George, a 27 year old woman, arrived at the outpatient department of [facility] accompanied by her father and spouse. She has been diagnosed with Schizophrenia, Anxiety, and Depression. Patient initially presented for medication management with either Dr. Esmeralda George or Dr. Morrie George on the upper floor of the building. Following her outpatient visit for medication management,  patient and her family were directed to the first floor for crises assessment regarding potential admission to observation. During triage, the patient demonstrated reluctance to cooperate. She declined to provide her name and date of birth, despite encouragement from her family. She also refused to confirm or deny any thoughts of self-harm, harm to others, or auditory hallucinations. When questioned about substance use, she responded, "Yes, I drink wine. I'm involved in selling drugs." According to her family, Barbara George needs  admission for adjustments to her psychiatric medications, as the current prescriptions have not been effective since starting them four weeks ago. The patient exhibited signs of paranoia, agitation, tangential thinking, disorganization, and unusual behavior during the assessment.  How Long Has This Been Causing You Problems? 1-6 months  Have You Recently Had Any Thoughts About Hurting Yourself?  (Patient refused to confirm and/or deny due to mental state.)  Are You Planning to Commit Suicide/Harm Yourself At This time?  (Patient refused to confirm and/or deny due to mental state.)  Have you Recently Had Thoughts About Hurting Someone Barbara George?  (Patient refused to confirm and/or deny due to mental state.)  Are You Planning To Harm Someone At This Time?  (Patient refused to confirm and/or deny due to mental state.)   Are you currently experiencing any auditory, visual or other hallucinations?  (Patient refused to confirm and/or deny due to mental state.)  Have You Used Any Alcohol or Drugs in the Past 24 Hours?  (Patient refused to confirm and/or deny due to mental state.)  Do you have any current medical co-morbidities that require immediate attention? No  Clinician description of patient physical appearance/behavior: Patient is uncooperative, paranoid, and presents with bizarre behaviors.  What Do You Feel Would Help You the Most Today? Medication(s);Treatment for Depression or other mood problem  If access to [facility] was not available, would you have sought care in the Emergency Department? No  Determination of Need Urgent (48 hours)  Options For Referral Medication Management;Inpatient Hospitalization

## 2022-12-11 NOTE — ED Notes (Signed)
Patient A&Ox4. Patient appears irritable and agitated. Patient denies SI/HI and AVH.  Patient denies any physical complaints when asked. No acute distress noted. Support and encouragement provided. Routine safety checks conducted according to facility protocol. Encouraged patient to notify staff if thoughts of harm toward self or others arise. Patient verbalize understanding and agreement. Will continue to monitor for safety.

## 2022-12-11 NOTE — Progress Notes (Cosign Needed)
Psychiatric Initial Adult Assessment  Patient Identification: Barbara George MRN:  [id number] Date of Evaluation:  12/11/2022 Referral Source: Walk-in  Assessment:  Barbara George is a 27 y.o. female with a history of schizoaffective disorder bipolar type who presents in person accompanied by her husband and father to [facility] as a walk-in for initial evaluation of schizoaffective disorder, bipolar type.  Patient reports current manic episode with psychotic features, with decreased need for sleep, elevated energy engaging in multiple tasks, as well as disorganized thoughts and speech, ongoing for the past 3 to 4 weeks.  On assessment today, patient is mostly able to engage in conversation appropriately, but does display some inappropriate answers and disorganized speech.  She interrupts this Clinical research associate, asking unrelated questions.  She is, however, redirectable.  Her speech is not pressured, nor rapid.  Dad stated that what we see today is similar to how her episodes typically start before they progress to patient leaving the home and requiring 24-hour supervision for her impulsivity.  Patient's primary concerns are that she has the proper medication to help her sleep, limit weight gain, and would not affect her becoming pregnant.  Husband and dad's primary concerns are that patient gets sleep and becomes less disorganized.  Patient was previously prescribed Zyprexa 15 mg nightly in December 2022 by Toy Cookey, DNP, but has been noncompliant with this regimen once feeling better.  Over the past 3 to 4 weeks, patient has been taking the medication 2 to 3 days/week, without improvement.  She was also previously on Abilify Maintena LAI from 2018-2020, to which she responded well.  Patient reports that she would prefer a p.o. option rather than returning to an LAI.  She is advised that this is feasible.  Advise provider assuming care to discuss LAI with patient  again; that p.o. is possible as a bridge, but to ensure success, with need LAI.  Discussed with patient and family the need for inpatient care to stabilize patient, and they are all in agreement.  As patient is not necessarily a danger to herself nor others, recommend overnight observation at [facility], with plan to start medications and reassess tomorrow morning to determine whether inpatient hospitalization is needed.  Keeping patient's goals in mind, recommend starting Risperdal or Haldol.  At this time, patient does not appear to need an FGA, so Risperdal may be more appropriate, although Haldol safest in pregnancy.  Of note, patient has been assessed for infertility, and her prolactin level is just below the lower limit of normal.  Risperdal may be helpful to increase her prolactin level, which could aid in her fertility.  Risk Assessment: A suicide and violence risk assessment was performed as part of this evaluation. There patient is deemed to be at chronic elevated risk for self-harm/suicide given the following factors: impulsive tendencies, agitation, history of schizoaffective disorder, and poor adherence to treatment. These risk factors are mitigated by the following factors: lack of active SI/HI, no known access to weapons or firearms, no history of previous suicide attempts, no history of violence, motivation for treatment, supportive family, sense of responsibility to family and social supports, presence of a significant relationship, presence of an available support system, safe housing, and support system in agreement with treatment recommendations. The patient is deemed to be at chronic elevated risk for violence given the following factors: agitation, active symptoms of psychosis, and active symptoms of mania. These risk factors are mitigated by the following factors: no known history of violence towards  others, no known history of threats of harm towards others, no command hallucinations to harm  others in the last 6 months, religiosity, and connectedness to family. There is no acute risk for suicide or violence at this time. The patient was educated about relevant modifiable risk factors including following recommendations for treatment of psychiatric illness and abstaining from substance abuse.  While future psychiatric events cannot be accurately predicted, the patient does currently require acute inpatient psychiatric care, willing to go to observation unit to be started on medications and reassessed, but does not currently meet [facility] involuntary commitment criteria, as she is willingly seeking help. Should inpatient hospitalization be recommended by team assuming care, low threshold to IVC if patient is unwilling to go voluntarily.  Plan:  # Schizoaffective disorder, bipolar type Past medication trials: Abilify Maintena, Zyprexa Status of problem: Current manic episode with psychotic features Interventions: -- Admit to [facility] observation unit -- Team assuming care to initiate antipsychotic medication as appropriate; see assessment for recommendations.  #Elevated BP without diagnosis of HTN Interventions: -- Advised to schedule appointment with PCP  Patient was given contact information for behavioral health clinic and was instructed to call 911 for emergencies.    Patient and plan of care will be discussed with the Attending MD ,Dr. Josephina Shih, who agrees with the above statement and plan.   Subjective:  Chief Complaint:  Chief Complaint  Patient presents with   Schizophrenia   Manic Behavior   Walk-in    History of Present Illness:  Patient was seen yesterday for therapy walk-in where she reported the desire to restart medications, with the plan of getting onto an LAI.   Today, reports not feeling  Episode began 3 weeks ago. Restarted Zyprexa. Not sleeping, only 2-3 hours ago. Eating well. Worries about weight gain as a side effect.  Dad reports disorganized  thoughts. Beginning signs of  When at worst, patient does not stay home and has to be supervised. Not aggressive unless attempting to force her to do something she does not want.  Zyprexa 15 mg no longer working for her. She was previously prescribed; stopped when she felt better, and restarted 3 weeks ago, but ineffective. Has concerns about becoming pregnant. Actively trying to get pregnant,  Denies more  Good mood. Denies feeling worried, anxious.  No safety concerns.   Husband reports that she would take medications two days per week to sleep.    Hobbies: Killing bodies, spirits. Denies SI, HI, AVH. Sees everyday, figures with eyes. Believes doctors can read her mind, not anyone else.   Not taking metformin.  Past Psychiatric History:  Diagnoses: Bipolar 1 disorder, schizoaffective disorder bipolar type, and psychosis.  Medication trials: Zyprexa- worked well.  Used to receive IM Abilify Maintena 400 mg injections between 2018 and 2020.  Previous psychiatrist/therapist: [facility], last saw Toy Cookey, DNP in 05/2021. Hospitalizations: Has had several psychiatric hospitalizations in the past.  Has been hospitalized at [facility], [facility] in [place] and others.  Suicide attempts: Denies SIB: Denies Hx of violence towards others: Denies Current access to guns: Denies Hx of trauma/abuse: Denies  Substance Abuse History in the last 12 months:  No.  Past Medical History:  Past Medical History:  Diagnosis Date   Bipolar 1 disorder (HCC)    Depression    Diabetes mellitus without complication (HCC)    Type 2 (Pre)   Schizophrenia (HCC)    No past surgical history on file.  Family Psychiatric History: Denies  Family  History:  Family History  Problem Relation Age of Onset   Hypertension Father     Social History:   Academic/Vocational:  Social History   Socioeconomic History   Marital status: Married    Spouse name: Amadou   Number of children: Not on file    Years of education: Not on file   Highest education level: Associate degree: occupational, Scientist, product/process development, or vocational program  Occupational History   Occupation: unemployed  Tobacco Use   Smoking status: Never   Smokeless tobacco: Never  Vaping Use   Vaping Use: Never used  Substance and Sexual Activity   Alcohol use: No    Alcohol/week: 0.0 standard drinks of alcohol   Drug use: No   Sexual activity: Yes    Birth control/protection: None  Other Topics Concern   Not on file  Social History Narrative   ** Merged History Encounter **       Social Determinants of Health   Financial Resource Strain: Not on file  Food Insecurity: No Food Insecurity (04/03/2019)   Hunger Vital Sign    Worried About Running Out of Food in the Last Year: Never true    Ran Out of Food in the Last Year: Never true  Transportation Needs: No Transportation Needs (04/03/2019)   PRAPARE - Administrator, Civil Service (Medical): No    Lack of Transportation (Non-Medical): No  Physical Activity: Inactive (04/03/2019)   Exercise Vital Sign    Days of Exercise per Week: 0 days    Minutes of Exercise per Session: 0 min  Stress: Not on file  Social Connections: Socially Integrated (04/03/2019)   Social Connection and Isolation Panel [NHANES]    Frequency of Communication with Friends and Family: More than three times a week    Frequency of Social Gatherings with Friends and Family: Once a week    Attends Religious Services: More than 4 times per year    Active Member of [employer] or Organizations: Yes    Attends Banker Meetings: Not on file    Marital Status: Married    Additional Social History: updated  Allergies:   Allergies  Allergen Reactions   Peanut-Containing Drug Products Nausea And Vomiting   Pork-Derived Products Other (See Comments) and Rash    Religious purposes Religious purposes    Current Medications: Current Outpatient Medications  Medication Sig  Dispense Refill   letrozole (FEMARA) 2.5 MG tablet Take 1 tablet (2.5 mg total) by mouth daily. Take on days 3 to 7 following a spontaneous menses or progestin-induced bleed. (Patient not taking: Reported on 09/17/2022) 5 tablet 2   metFORMIN (GLUCOPHAGE) 500 MG tablet Take 500 mg by mouth 2 (two) times daily.     mupirocin ointment (BACTROBAN) 2 % Apply 1 Application topically 2 (two) times daily. To affected area till better 22 g 0   OLANZapine (ZYPREXA) 15 MG tablet Take 1 tablet (15 mg total) by mouth at bedtime. 30 tablet 3   rosuvastatin (CRESTOR) 5 MG tablet Take 5 mg by mouth daily. (Patient not taking: Reported on 01/01/2022)     spironolactone (ALDACTONE) 50 MG tablet Take 1 tablet (50 mg total) by mouth 2 (two) times daily. Will start with 50 mg twice daily, and increase to 100 mg twice daily as needed 60 tablet 2   No current facility-administered medications for this visit.    ROS: Review of Systems  Objective:  Psychiatric Specialty Exam: Blood pressure (!) 143/86, pulse 94, height 5\' 8"  (  1.727 m), weight 237 lb (107.5 kg), SpO2 100 %.Body mass index is 36.04 kg/m.  General Appearance: Well Groomed  Eye Contact:  Fair  Speech:  Clear and Coherent and Normal Rate  Volume:  Increased  Mood:  Euthymic  Affect:  Labile  Thought Content: Tangential   Suicidal Thoughts:  No  Homicidal Thoughts:  No  Thought Process:  Disorganized and Goal Directed  Orientation:  Other:  Patient refused to answer where she is    Memory: Immediate;   unable to assess Recent;   unable to assess  Judgment:  Impaired  Insight:  Present  Concentration:  Concentration: Fair and Attention Span: Fair  Recall:  not formally assessed   Fund of Knowledge: Fair  Language: Fair  Psychomotor Activity:  Normal  Akathisia:  No  AIMS (if indicated): not done  Assets:  Desire for Improvement Housing Intimacy Leisure Time Resilience Social Support  ADL's:  Intact  Cognition: WNL  Sleep:  Poor    PE: General: well-appearing; no acute distress  Pulm: no increased work of breathing on room air  Strength & Muscle Tone: within normal limits Neuro: no focal neurological deficits observed  Gait & Station: normal  Metabolic Disorder Labs: Lab Results  Component Value Date   HGBA1C 6.3 (H) 01/01/2022   MPG 111.15 10/23/2018   MPG 105 08/09/2016   Lab Results  Component Value Date   PROLACTIN CANCELED 09/17/2022   PROLACTIN 4.6 (L) 09/17/2022   Lab Results  Component Value Date   CHOL 176 10/23/2018   TRIG 50 10/23/2018   HDL 71 10/23/2018   CHOLHDL 2.5 10/23/2018   VLDL 10 10/23/2018   LDLCALC 95 10/23/2018   LDLCALC 71 08/09/2016   Lab Results  Component Value Date   TSH CANCELED 09/17/2022    Therapeutic Level Labs: No results found for: "LITHIUM" No results found for: "CBMZ" Lab Results  Component Value Date   VALPROATE 127 (H) 09/07/2016    Screenings:  AIMS    Flowsheet Row [facility] Admission (Discharged) from 10/23/2018 in [facility]  AIMS Total Score 0 0      AUDIT    Flowsheet Row [facility]) [facility] Admission (Discharged) from [facility]08/2016 [facility]IC[facility]  Alcohol Use Disorder Identification Test Final Score (AUDIT) 0 0      GAD-7    Flowsheet Row Office Visit from 09/17/2022 in [facility] [place] Clinical Support from 02/07/2021 in [facility] Clinical Support from 11/08/2020 in [facility]  Total GAD-7 Score 2 1 0      PHQ2-9    Flowsheet Row Counselor from 12/10/2022 in [facility] Office Visit from 09/17/2022 in [facility] [place] Nutrition from 07/24/2021 in [facility]  at [facility] Clinical Support from 02/07/2021 in [facility] Clinical Support from 11/08/2020 in [facility]  PHQ-2 Total Score 3 1 0 0 0  PHQ-9 Total Score 13 2 -- 4 2      Flowsheet Row ED from 12/11/2022 in [facility] Counselor from 12/10/2022 in [facility] ED from 10/26/2022 in [facility] at [facility] RISK CATEGORY No Risk No Risk No Risk  Collaboration of Care: Collaboration of Care: Primary Care Provider AEB patient reports having a PCP and Psychiatrist AEB this writer saw patient for psychiatric care  Patient/Guardian was advised Release of Information must be obtained prior to any record release in order to collaborate their care with an outside provider. Patient/Guardian was advised if they have not already done so to contact the registration department to sign all necessary forms in order for [place] to release information regarding their care.   Consent: Patient/Guardian gives verbal consent for treatment and assignment of benefits for services provided during this visit. Patient/Guardian expressed understanding and agreed to proceed.   A total of 75 minutes was spent involved in face to face clinical care, chart review, documentation, and assisting with admission to observation unit.   Lamar Sprinkles, MD 7/9/202410:29 AM

## 2022-12-11 NOTE — ED Notes (Signed)
Report called to Dana, RN @ [facility]. Safe [facility] called

## 2022-12-11 NOTE — ED Provider Notes (Signed)
BH Urgent Care Continuous Assessment Admission H&P  Date: 12/11/22 Patient Name: Barbara George MRN: [id number] Chief Complaint: Schizoaffective, Bipolar, Currently Manic   Diagnoses:  Final diagnoses:  Schizoaffective disorder, bipolar type (HCC)    HPI:  Barbara George is a 27 year old female with prior medical history of schizoaffective disorder, bipolar type with multiple previous psychiatric hospitalizations, that presents to the Behavioral Health Urgent Care with father and husband due to concerns of decreased sleeping, elevated energy, engagment in multiple tasks, disorgznized thoughts and speech. Abnormal behavior had been going on for the past 3-4 weeks after intermittently taking her home Zyprexa. Patient was seen sitting calmly in the exam room and was pleasant and easily distracted during the interview. Throughout the interview, the patient would interrupt the writer with unrelated questions. When questioned directly the patient's responses were often tangential or unrelated to the question asked. Father reports that she stopped taking her medications because she was feeling better, disliked that it made her gain weight, and wants to get pregnant. After stopping her medications the patient has had decreased sleep, less than 4 hours a night.    Father expressed this has happened several times previously where she stops her medications, begins behaving abnormally, and results in her having an inpatient psychiatric treatment. He notes that she improves after each inpatient treatment, but does not adhere to her medications.    Patient was previously prescribed Zyprexa 15 mg nightly in December 2022 by Toy Cookey, DNP, but has been noncompliant with this regimen once feeling better.  Over the past 3 to 4 weeks, patient has been taking the medication 2 to 3 days/week, without improvement.  She was also previously on Abilify Maintena LAI from 2018-2020, to which she  responded well.  Patient is actively attempting to get pregnant and would like a medication that would not put that pregnancy at risk or increase her possibility of weight gain. We discussed the possibility  of  starting risperidone which the patient was agreeable with. Discussed with patient and family the need for inpatient care to stabilize patient, and they are all in agreement.  As patient is not necessarily a danger to herself nor others, recommend admission to the Behavioral unit and possible inpatient medication stabilization for symptomatic improvement.    Patient denies SI/HI/AVH.  Total Time spent with patient: 30 minutes  Musculoskeletal  Strength & Muscle Tone: within normal limits Gait & Station: normal Patient leans: N/A  Psychiatric Specialty Exam  Presentation General Appearance:  Appropriate for Environment; Well Groomed  Eye Contact: Fair  Speech: Pressured; Garbled  Speech Volume: Increased  Handedness:No data recorded  Mood and Affect  Mood: Euphoric  Affect: Full Range; Congruent   Thought Process  Thought Processes: Disorganized  Descriptions of Associations:Loose  Orientation:Partial (Oriented to person only)  Thought Content:Scattered; Delusions; Illogical  Diagnosis of Schizophrenia or Schizoaffective disorder in past: Yes  Duration of Psychotic Symptoms: Less than six months  Hallucinations:Hallucinations: None  Ideas of Reference:None  Suicidal Thoughts:Suicidal Thoughts: No  Homicidal Thoughts:Homicidal Thoughts: No   Sensorium  Memory: Immediate Poor; Recent Poor  Judgment: Poor  Insight: Poor   Executive Functions  Concentration: Poor  Attention Span: Poor  Recall: Poor  Fund of Knowledge: Fair  Language: Good   Psychomotor Activity  Psychomotor Activity: Psychomotor Activity: Normal   Assets  Assets: Social Support   Sleep  Sleep: Sleep: Poor   Nutritional Assessment (For OBS and FBC  admissions only) Has the patient had a  weight loss or gain of 10 pounds or more in the last 3 months?: No Has the patient had a decrease in food intake/or appetite?: Yes Does the patient have dental problems?: No Does the patient have eating habits or behaviors that may be indicators of an eating disorder including binging or inducing vomiting?: No Has the patient recently lost weight without trying?: 0 Has the patient been eating poorly because of a decreased appetite?: 1 Malnutrition Screening Tool Score: 1    Physical Exam HENT:     Nose: Nose normal.  Eyes:     Conjunctiva/sclera: Conjunctivae normal.  Cardiovascular:     Rate and Rhythm: Normal rate.  Pulmonary:     Effort: Pulmonary effort is normal.  Musculoskeletal:        General: Normal range of motion.     Cervical back: Normal range of motion.  Neurological:     Mental Status: She is alert. She is disoriented.  Psychiatric:        Mood and Affect: Mood is elated. Affect is labile.        Speech: Speech is tangential.        Behavior: Behavior is cooperative.        Thought Content: Thought content is not paranoid or delusional. Thought content does not include homicidal or suicidal ideation. Thought content does not include homicidal or suicidal plan.        Judgment: Judgment is impulsive.    Review of Systems  Constitutional:  Negative for chills and fever.  Respiratory:  Negative for cough and hemoptysis.   Gastrointestinal:  Negative for nausea and vomiting.  Psychiatric/Behavioral:  Negative for depression, hallucinations, substance abuse and suicidal ideas. The patient has insomnia. The patient is not nervous/anxious.     Blood pressure 124/78, pulse 80, temperature 98.4 F (36.9 C), resp. rate 18, SpO2 100 %. There is no height or weight on file to calculate BMI.  Past Psychiatric History: Schizoaffective disorder   Is the patient at risk to self? Yes  Has the patient been a risk to self in the past 6  months? Yes .    Has the patient been a risk to self within the distant past? Yes   Is the patient a risk to others? No   Has the patient been a risk to others in the past 6 months? No   Has the patient been a risk to others within the distant past? No   Past Medical History: Prediabetes  Family History: N/A Family Psychiatric  History: Unknown psychiatric illness in great uncle on paternal side Social History: Lives with her husband, and the husbands nephew. Graduated highschool. Does not currently work. Denies any EtOH and substance use.  Tobacco Cessation:  N/A, patient does not currently use tobacco products    Last Labs:  Admission on 12/11/2022  Component Date Value Ref Range Status   WBC 12/11/2022 6.6  4.0 - 10.5 K/uL Final   RBC 12/11/2022 4.83  3.87 - 5.11 MIL/uL Final   Hemoglobin 12/11/2022 11.4 (L)  12.0 - 15.0 g/dL Final   HCT 40/98/1191 37.0  36.0 - 46.0 % Final   MCV 12/11/2022 76.6 (L)  80.0 - 100.0 fL Final   MCH 12/11/2022 23.6 (L)  26.0 - 34.0 pg Final   MCHC 12/11/2022 30.8  30.0 - 36.0 g/dL Final   RDW 47/82/9562 15.2  11.5 - 15.5 % Final   Platelets 12/11/2022 405 (H)  150 - 400 K/uL Final   nRBC  12/11/2022 0.0  0.0 - 0.2 % Final   Neutrophils Relative % 12/11/2022 49  % Final   Neutro Abs 12/11/2022 3.3  1.7 - 7.7 K/uL Final   Lymphocytes Relative 12/11/2022 41  % Final   Lymphs Abs 12/11/2022 2.7  0.7 - 4.0 K/uL Final   Monocytes Relative 12/11/2022 7  % Final   Monocytes Absolute 12/11/2022 0.5  0.1 - 1.0 K/uL Final   Eosinophils Relative 12/11/2022 2  % Final   Eosinophils Absolute 12/11/2022 0.1  0.0 - 0.5 K/uL Final   Basophils Relative 12/11/2022 1  % Final   Basophils Absolute 12/11/2022 0.0  0.0 - 0.1 K/uL Final   Immature Granulocytes 12/11/2022 0  % Final   Abs Immature Granulocytes 12/11/2022 0.01  0.00 - 0.07 K/uL Final   Performed at [facility], [address] [place], [place] [address]   Sodium 12/11/2022 138  135 - 145 mmol/L  Final   Potassium 12/11/2022 4.4  3.5 - 5.1 mmol/L Final   Chloride 12/11/2022 103  98 - 111 mmol/L Final   CO2 12/11/2022 26  22 - 32 mmol/L Final   Glucose, Bld 12/11/2022 95  70 - 99 mg/dL Final   Glucose reference range applies only to samples taken after fasting for at least 8 hours.   BUN 12/11/2022 <5 (L)  6 - 20 mg/dL Final   Creatinine, Ser 12/11/2022 0.73  0.44 - 1.00 mg/dL Final   Calcium 60/45/4098 10.1  8.9 - 10.3 mg/dL Final   Total Protein 11/91/4782 7.5  6.5 - 8.1 g/dL Final   Albumin 95/62/1308 3.6  3.5 - 5.0 g/dL Final   AST 65/78/4696 18  15 - 41 U/L Final   ALT 12/11/2022 14  0 - 44 U/L Final   Alkaline Phosphatase 12/11/2022 61  38 - 126 U/L Final   Total Bilirubin 12/11/2022 0.4  0.3 - 1.2 mg/dL Final   GFR, Estimated 12/11/2022 >60  >60 mL/min Final   Comment: (NOTE) Calculated using the CKD-EPI Creatinine Equation (2021)    Anion gap 12/11/2022 9  5 - 15 Final   Performed at [facility], [address], [place], [place] [address]   Alcohol, Ethyl (B) 12/11/2022 <10  <10 mg/dL Final   Comment: (NOTE) Lowest detectable limit for serum alcohol is 10 mg/dL.  For medical purposes only. Performed at [facility], [address] [place], [place] [address]    Cholesterol 12/11/2022 228 (H)  0 - 200 mg/dL Final   Triglycerides 40/03/2724 122  <150 mg/dL Final   HDL 36/64/4034 91  >40 mg/dL Final   Total CHOL/HDL Ratio 12/11/2022 2.5  RATIO Final   VLDL 12/11/2022 24  0 - 40 mg/dL Final   LDL Cholesterol 12/11/2022 113 (H)  0 - 99 mg/dL Final   Comment:        Total Cholesterol/HDL:CHD Risk Coronary Heart Disease Risk Table                     Men   Women  1/2 Average Risk   3.4   3.3  Average Risk       5.0   4.4  2 X Average Risk   9.6   7.1  3 X Average Risk  23.4   11.0        Use the calculated Patient Ratio above and the CHD Risk Table to determine the patient's CHD Risk.  ATP III CLASSIFICATION (LDL):  <100     mg/dL    Optimal  161-096  mg/dL   Near or Above                    Optimal  130-159  mg/dL   Borderline  045-409  mg/dL   High  >811     mg/dL   Very High Performed at [facility], [address], [place], [place] [address]    TSH 12/11/2022 0.592  0.350 - 4.500 uIU/mL Final   Comment: Performed by a 3rd Generation assay with a functional sensitivity of <=0.01 uIU/mL. Performed at [facility], [address], [place], [place] [address]   Office Visit on 09/17/2022  Component Date Value Ref Range Status   TSH 09/17/2022 0.860  0.450 - 4.500 uIU/mL Final   T3, Free 09/17/2022 3.1  2.0 - 4.4 pg/mL Final   Free T4 09/17/2022 1.18  0.82 - 1.77 ng/dL Final   [facility] 13/01/6577 5.3  mIU/mL Final   Comment:                      Adult Female             Range                       Follicular phase      3.5 -  12.5                       Ovulation phase       4.7 -  21.5                       Luteal phase          1.7 -   7.7                       Postmenopausal       25.8 - 134.8    LH 09/17/2022 4.4  mIU/mL Final   Comment:                      Adult Female              Range                       Follicular phase      2.4 -  12.6                       Ovulation phase      14.0 -  95.6                       Luteal phase          1.0 -  11.4                       Postmenopausal        7.7 -  58.5    Prolactin 09/17/2022 4.6 (L)  4.8 - 33.4 ng/mL Final   hCG Quant 09/17/2022 <1  mIU/mL Final   Comment:                      Female (Non-pregnant)    0 -     5                             (  Postmenopausal)  0 -     8                      Female (Pregnant)                      Weeks of Gestation                              3                6 -    71                              4               10 -   750                              5              217 -  7138                              6              158 - 31795                              7             3697 -[phone]                               8            32065 -213086                              9            63803 -151410                             10            46509 -578469                             12            27832 -210612                             14            13950 - [phone]  17             [phone] - [phone]                             18             8099 - 58176 Roche E                          CLIA methodology    Testosterone 09/17/2022 26  13 - 71 ng/dL Final   Free T4 16/03/9603 CANCELED  ng/dL Final-Edited   Comment: Test not performed. No specimen received.  Result canceled by the ancillary.    Testosterone 09/17/2022 CANCELED  ng/dL Final-Edited   Comment: Test not performed. No specimen received.  Result canceled by the ancillary.    TSH 09/17/2022 CANCELED  uIU/mL Final-Edited   Comment: Test not performed. No specimen received.  Result canceled by the ancillary.    LH 09/17/2022 CANCELED  mIU/mL Final-Edited   Comment: Test not performed. No specimen received.                      Adult Female              Range                       Follicular phase      2.4 -  12.6                       Ovulation phase      14.0 -  95.6                       Luteal phase          1.0 -  11.4                       Postmenopausal        7.7 -  58.5  Result canceled by the ancillary.    [facility] 09/17/2022 CANCELED  mIU/mL Final-Edited   Comment: Test not performed. No specimen received.                      Adult Female             Range                       Follicular phase      3.5 -  12.5                       Ovulation phase       4.7 -  21.5                       Luteal phase          1.7 -   7.7                       Postmenopausal       25.8 - 134.8  Result canceled by the ancillary.    hCG Quant 09/17/2022 CANCELED  mIU/mL Final-Edited   Comment: Test not  performed. No specimen received.  Female (Non-pregnant)    0 -     5                             (Postmenopausal)  0 -     8                      Female (Pregnant)                      Weeks of Gestation                              3                6 -    71                              4               10 -   750                              5              217 -  7138                              6              158 - 31795                              7             3697 -161096                              8            32065 -045409                              8            [phone]                             29            52841 -210612                             14            13950 - [phone]  16             9040 - 56451                             17             8175 - 55868                                                       18             8099 - 58176 Roche ECLIA methodology  Result canceled by the ancillary.    Prolactin 09/17/2022 CANCELED  ng/mL Final-Edited   Comment: Test not performed. No specimen received.  Result canceled by the ancillary.    T3, Free 09/17/2022 CANCELED  pg/mL Final-Edited   Comment: Test not performed. No specimen received.  Result canceled by the ancillary.   Admission on 07/06/2022, Discharged on 07/06/2022  Component Date Value Ref Range Status   Glucose, UA 07/06/2022 NEGATIVE  NEGATIVE mg/dL Final   Bilirubin Urine 07/06/2022 NEGATIVE  NEGATIVE Final   Ketones, ur 07/06/2022 NEGATIVE  NEGATIVE mg/dL Final   Specific Gravity, Urine 07/06/2022 <=1.005  1.005 - 1.030 Final   Hgb urine dipstick 07/06/2022 SMALL (A)  NEGATIVE Final   pH 07/06/2022 5.5  5.0 - 8.0 Final   Protein, ur 07/06/2022 NEGATIVE  NEGATIVE mg/dL Final   Urobilinogen, UA 07/06/2022 0.2  0.0 - 1.0 mg/dL Final    Nitrite 16/03/9603 NEGATIVE  NEGATIVE Final   Leukocytes,Ua 07/06/2022 NEGATIVE  NEGATIVE Final   Biochemical Testing Only. Please order routine urinalysis from main lab if confirmatory testing is needed.   Preg Test, Ur 07/06/2022 NEGATIVE  NEGATIVE Final   Comment:        THE SENSITIVITY OF THIS METHODOLOGY IS >24 mIU/mL     Allergies: Peanut-containing drug products and Pork-derived products  Medications:  Facility Ordered Medications  Medication   acetaminophen (TYLENOL) tablet 650 mg   alum & mag hydroxide-simeth (MAALOX/MYLANTA) 200-200-20 MG/5ML suspension 30 mL   magnesium hydroxide (MILK OF MAGNESIA) suspension 30 mL   OLANZapine zydis (ZYPREXA) disintegrating tablet 5 mg   And   LORazepam (ATIVAN) tablet 1 mg   And   ziprasidone (GEODON) injection 20 mg   risperiDONE (RISPERDAL M-TABS) disintegrating tablet 2 mg   PTA Medications  Medication Sig   OLANZapine (ZYPREXA) 15 MG tablet Take 1 tablet (15 mg total) by mouth at bedtime.      Medical Decision Making    Tawnie Midou Kandis Nab  is a 27 y.o. woman with a PMHx of Schizoaffective disorder bipolar type. Who was evaluated in the [facility] Urgent care after presenting to clinic experiencing a manic episode. After evaluating the patient alongside, Dr. Viviano Simas, and her history of medication noncompliance, I believe the patient will benefit from inpatient treatment and medication management and symptomatic stabilization. The patient is agreeable to admission on to the obs unit with possible  inpatient medication management and additional services at a behavioral hospital. We will start the patient on Risperidone to help treat the patients psychotic symptoms. Currently awaiting bed placement and transfer to the [facility] today or tomorrow morning.    Recommendations    Plan:   # Schizoaffective  disorder, bipolar type Past medication trials: Abilify Maintena,  Zyprexa Status of problem: Current manic episode with psychotic features Interventions: -- [facility] observation unit, transfer to inpatient [facility]  -- Risperidone 2 mg daily ordered -- Labs ordered -- Agitation PRNs ordered   #Elevated BP without diagnosis of HTN Interventions: -- Advised to schedule appointment with PCP   Disposition: Admit to obs unit, transfer to inpatient [facility] pending bed availability   Peterson Ao, MD 12/11/22  5:35 PM

## 2022-12-11 NOTE — ED Notes (Signed)
Patient resting quietly in bed with eyes open. Respirations equal and unlabored, skin warm and dry, NAD. Routine safety checks conducted according to facility protocol. Will continue to monitor for safety.  

## 2022-12-11 NOTE — Progress Notes (Signed)
Patient presents to [facility] voluntarily from [facility]. Patient denies SI/HI/AVH and pain. Patient states that, "I'm just here to fill my prescription and you're forcing me." Per report from [facility], patient has been disorganized for 3-4 weeks and not sleeping. Patient states that she has not been taking her medications at home and is concerned with weight gain with medications. Patient cites "God" as her support system but added husband and father when asked about family. Patient also reports, "I'm only scared of God." Patient states that she has type 2 diabetes and is on metformin at home, but initially denied any at home meds besides tylenol. Skin assessment complete and found to be WDL with no contraband. Patient offered food and drink but declined. Patient oriented to unit. Safety checks implemented. Patient remains safe at this time.

## 2022-12-11 NOTE — BHH Group Notes (Signed)
Adult Psychoeducational Group Note  Date:  12/11/2022 Time:  8:49 PM  Group Topic/Focus:  Wrap-Up Group:   The focus of this group is to help patients review their daily goal of treatment and discuss progress on daily workbooks.  Participation Level:    Participation Quality:    Affect:   Cognitive:    Insight:   Engagement in Group:    Modes of Intervention:    Additional Comments:  Pt did not attend group today.   Satira Anis 12/11/2022, 8:49 PM

## 2022-12-11 NOTE — Progress Notes (Signed)
Pt has been accepted to [facility] TODAY 12/11/2022, pending labs, vital signs, EKG, and signed voluntary consent faxed to [phone]. Bed assignment: 501-1  Pt meets inpatient criteria per Peterson Ao, MD  Attending Physician will be Phineas Inches, MD  Report can be called to: - Adult unit: [phone]  Pt can arrive after pending items are received  Care Team Notified: [facility] Rona Ravens, RN, Peterson Ao, MD, Gretta Cool, MD, Malva Limes, RN, and Neale Burly, RN  [place], [place]  12/11/2022 12:52 PM

## 2022-12-12 ENCOUNTER — Encounter (HOSPITAL_COMMUNITY): Payer: Self-pay

## 2022-12-12 DIAGNOSIS — F250 Schizoaffective disorder, bipolar type: Secondary | ICD-10-CM | POA: Diagnosis not present

## 2022-12-12 LAB — PROLACTIN: Prolactin: 13 ng/mL (ref 4.8–33.4)

## 2022-12-12 MED ORDER — ARIPIPRAZOLE 5 MG PO TABS
5.0000 mg | ORAL_TABLET | Freq: Every day | ORAL | Status: DC
  Administered 2022-12-12 – 2022-12-13 (×2): 5 mg via ORAL
  Filled 2022-12-12 (×5): qty 1

## 2022-12-12 MED ORDER — SIMVASTATIN 5 MG PO TABS
10.0000 mg | ORAL_TABLET | Freq: Every day | ORAL | Status: DC
  Administered 2022-12-12 – 2022-12-20 (×9): 10 mg via ORAL
  Filled 2022-12-12 (×10): qty 2
  Filled 2022-12-12: qty 1

## 2022-12-12 NOTE — Progress Notes (Signed)
Recreation Therapy Notes  INPATIENT RECREATION THERAPY ASSESSMENT  Patient Details Name: Cai Flott MRN: [id number] DOB: March 15, 1996 Today's Date: 12/12/2022       Information Obtained From: Patient  Able to Participate in Assessment/Interview: Yes  Patient Presentation: Alert  Reason for Admission (Per Patient): Med Non-Compliance, Other (Comments) (Not sleeping; Disorganized)  Patient Stressors: Other (Comment) (None identified)  Coping Skills:   TV, Music, Read  Leisure Interests (2+):  Community - Other (Comment), Individual - Other (Comment) (Park, Gym, "do things by myself sometimes")  Frequency of Recreation/Participation: Other (Comment) ("it depends"; the park whenever possible and to the gym 3-4 times a week)  Awareness of Community Resources:  Yes  Community Resources:  Library, [employer]  Current Use: Yes  If no, Barriers?:    Expressed Interest in [employer] Information: Yes  [place]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

## 2022-12-12 NOTE — BH IP Treatment Plan (Signed)
Interdisciplinary Treatment and Diagnostic Plan Update  12/12/2022 Time of Session: 10:35 AM  Barbara George MRN: [id number]  Principal Diagnosis: Schizoaffective disorder, bipolar type (HCC)  Secondary Diagnoses: Principal Problem:   Schizoaffective disorder, bipolar type (HCC)   Current Medications:  Current Facility-Administered Medications  Medication Dose Route Frequency Provider Last Rate Last Admin   acetaminophen (TYLENOL) tablet 650 mg  650 mg Oral Q6H PRN Peterson Ao, MD   650 mg at 12/12/22 0222   alum & mag hydroxide-simeth (MAALOX/MYLANTA) 200-200-20 MG/5ML suspension 30 mL  30 mL Oral Q4H PRN Peterson Ao, MD       diphenhydrAMINE (BENADRYL) capsule 50 mg  50 mg Oral TID PRN Peterson Ao, MD   50 mg at 12/12/22 0407   Or   diphenhydrAMINE (BENADRYL) injection 50 mg  50 mg Intramuscular TID PRN Peterson Ao, MD       haloperidol (HALDOL) tablet 5 mg  5 mg Oral TID PRN Peterson Ao, MD   5 mg at 12/12/22 0406   Or   haloperidol lactate (HALDOL) injection 5 mg  5 mg Intramuscular TID PRN Peterson Ao, MD       hydrOXYzine (ATARAX) tablet 25 mg  25 mg Oral TID PRN Peterson Ao, MD       LORazepam (ATIVAN) tablet 2 mg  2 mg Oral TID PRN Peterson Ao, MD   2 mg at 12/12/22 0407   Or   LORazepam (ATIVAN) injection 2 mg  2 mg Intramuscular TID PRN Peterson Ao, MD       magnesium hydroxide (MILK OF MAGNESIA) suspension 30 mL  30 mL Oral Daily PRN Peterson Ao, MD       traZODone (DESYREL) tablet 50 mg  50 mg Oral QHS PRN Peterson Ao, MD       PTA Medications: Medications Prior to Admission  Medication Sig Dispense Refill Last Dose   OLANZapine (ZYPREXA) 15 MG tablet Take 1 tablet (15 mg total) by mouth at bedtime. 30 tablet 3     Patient Stressors: Medication change or noncompliance    Patient Strengths: Supportive family/friends   Treatment Modalities: Medication Management, Group therapy, Case management,  1 to 1 session  with clinician, Psychoeducation, Recreational therapy.   Physician Treatment Plan for Primary Diagnosis: Schizoaffective disorder, bipolar type (HCC) Long Term Goal(s):     Short Term Goals:    Medication Management: Evaluate patient's response, side effects, and tolerance of medication regimen.  Therapeutic Interventions: 1 to 1 sessions, Unit Group sessions and Medication administration.  Evaluation of Outcomes: Not Progressing  Physician Treatment Plan for Secondary Diagnosis: Principal Problem:   Schizoaffective disorder, bipolar type (HCC)  Long Term Goal(s):     Short Term Goals:       Medication Management: Evaluate patient's response, side effects, and tolerance of medication regimen.  Therapeutic Interventions: 1 to 1 sessions, Unit Group sessions and Medication administration.  Evaluation of Outcomes: Not Progressing   RN Treatment Plan for Primary Diagnosis: Schizoaffective disorder, bipolar type (HCC) Long Term Goal(s): Knowledge of disease and therapeutic regimen to maintain health will improve  Short Term Goals: Ability to remain free from injury will improve, Ability to verbalize frustration and anger appropriately will improve, Ability to demonstrate self-control, Ability to participate in decision making will improve, Ability to verbalize feelings will improve, Ability to disclose and discuss suicidal ideas, Ability to identify and develop effective coping behaviors will improve, and Compliance with prescribed medications will improve  Medication Management: RN will administer medications as ordered  by provider, will assess and evaluate patient's response and provide education to patient for prescribed medication. RN will report any adverse and/or side effects to prescribing provider.  Therapeutic Interventions: 1 on 1 counseling sessions, Psychoeducation, Medication administration, Evaluate responses to treatment, Monitor vital signs and CBGs as ordered,  Perform/monitor CIWA, COWS, AIMS and Fall Risk screenings as ordered, Perform wound care treatments as ordered.  Evaluation of Outcomes: Not Progressing   LCSW Treatment Plan for Primary Diagnosis: Schizoaffective disorder, bipolar type (HCC) Long Term Goal(s): Safe transition to appropriate next level of care at discharge, Engage patient in therapeutic group addressing interpersonal concerns.  Short Term Goals: Engage patient in aftercare planning with referrals and resources, Increase social support, Increase ability to appropriately verbalize feelings, Increase emotional regulation, Facilitate acceptance of mental health diagnosis and concerns, Facilitate patient progression through stages of change regarding substance use diagnoses and concerns, Identify triggers associated with mental health/substance abuse issues, and Increase skills for wellness and recovery  Therapeutic Interventions: Assess for all discharge needs, 1 to 1 time with Social worker, Explore available resources and support systems, Assess for adequacy in community support network, Educate family and significant other(s) on suicide prevention, Complete Psychosocial Assessment, Interpersonal group therapy.  Evaluation of Outcomes: Not Progressing   Progress in Treatment: Attending groups: Yes. Participating in groups: Yes. Taking medication as prescribed: Yes. Toleration medication: No. Family/Significant other contact made: Yes, individual(s) contacted:  Adamimou, Adamamou [phone] and father Hamidou George [phone]   Patient understands diagnosis: No. Discussing patient identified problems/goals with staff: Yes. Medical problems stabilized or resolved: Yes. Denies suicidal/homicidal ideation: Yes. Issues/concerns per patient self-inventory: No.   New problem(s) identified: No, Describe:  None reported   New Short Term/Long Term Goal(s): medication stabilization, elimination of SI thoughts, development of  comprehensive mental wellness plan.    Patient Goals:  " Learn coping skills to help with my depression "   Discharge Plan or Barriers: Patient recently admitted. CSW will continue to follow and assess for appropriate referrals and possible discharge planning.    Reason for Continuation of Hospitalization: Anxiety Depression Mania Medication stabilization Other; describe Disorganized , decreased sleep   Estimated Length of Stay: 5-7 days  Last 3 [place] Suicide Severity Risk Score: Flowsheet Row [facility] (Current[facility] Most recent reading at 12/11/2022  9:27 PM ED from 12/11/2022 in [facility] Most recent reading at 12/11/2022 11:51 AM Counselor from 12/10/2022 in [facility] Most recent reading at 12/10/2022  8:05 AM  C-SSRS RISK CATEGORY No Risk No Risk No Risk       Last PHQ 2/9 Scores:    12/10/2022    8:03 AM 09/17/2022    1:18 PM 07/24/2021    3:15 PM  Depression screen PHQ 2/9  Decreased Interest 0 1 0  Down, Depressed, Hopeless 3 0 0  PHQ - 2 Score 3 1 0  Altered sleeping 3 0   Tired, decreased energy 1 1   Change in appetite 1 0   Feeling bad or failure about yourself  1 0   Trouble concentrating 2 0   Moving slowly or fidgety/restless 2 0   Suicidal thoughts 0 0   PHQ-9 Score 13 2   Difficult doing work/chores Very difficult      Scribe for Treatment Team: Beather Arbour 12/12/2022 2:17 PM

## 2022-12-12 NOTE — Progress Notes (Signed)
Dar Note: Patient presents with a calm mood and affect.  Denies suicidal thoughts, auditory and visual hallucinations.  Medications given as prescribed.  Routine safety checks maintained.  Voiced concern about side effect of medication affecting her getting pregnant.  Educated patient on therapeutic/side effect of prescribed medications.  Patient visible in milieu interacting well with staff and peers.  Patient is safe on and off the unit.

## 2022-12-12 NOTE — [facility] Suicide Risk Assessment (Signed)
Suicide Risk Assessment  Admission Assessment    [facility] Admission Suicide Risk Assessment  Nursing information obtained from:  Patient Demographic factors:  Low socioeconomic status Current Mental Status:  NA Loss Factors:  Financial problems / change in socioeconomic status Historical Factors:  NA Risk Reduction Factors:  Living with another person, especially a relative, Sense of responsibility to family, Positive social support, Religious beliefs about death  Total Time spent with patient: 30 minutes Principal Problem: Schizoaffective disorder, bipolar type (HCC) Diagnosis:  Principal Problem:   Schizoaffective disorder, bipolar type (HCC)  Subjective Data:  Barbara George is a 27 y.o. [place]- American female with prior psychiatric history significant for schizoaffective disorder bipolar type who presents voluntarily to [facility] from [facility] after stabilization for worsening re-occurrence of auditory hallucinations.   Continued Clinical Symptoms:  Alcohol Use Disorder Identification Test Final Score (AUDIT): 0 The "Alcohol Use Disorders Identification Test", Guidelines for Use in Primary Care, Second Edition.  World Science writer [facility]). Score between 0-7:  no or low risk or alcohol related problems. Score between 8-15:  moderate risk of alcohol related problems. Score between 16-19:  high risk of alcohol related problems. Score 20 or above:  warrants further diagnostic evaluation for alcohol dependence and treatment.  CLINICAL FACTORS:   Severe Anxiety and/or Agitation Bipolar Disorder:   Mixed State Schizophrenia:   Less than 24 years old More than one psychiatric diagnosis Currently Psychotic Previous Psychiatric Diagnoses and Treatments Medical Diagnoses and Treatments/Surgeries  Musculoskeletal: Strength & Muscle Tone: within normal limits Gait & Station: normal Patient leans: N/A  Psychiatric Specialty Exam:  Presentation   General Appearance:  Appropriate for Environment; Casual; Fairly Groomed  Eye Contact: Good  Speech: Clear and Coherent  Speech Volume: Normal  Handedness: Right  Mood and Affect  Mood: Euphoric  Affect: Congruent  Thought Process  Thought Processes: Disorganized  Descriptions of Associations:Loose  Orientation:Full (Time, Place and Person)  Thought Content:Scattered; Rumination; Delusions; Paranoid Ideation  History of Schizophrenia/Schizoaffective disorder:Yes  Duration of Psychotic Symptoms:Greater than six months  Hallucinations:Hallucinations: Auditory Description of Auditory Hallucinations: Hearing people reading quran and she follows along reading with them  Ideas of Reference:None  Suicidal Thoughts:Suicidal Thoughts: No  Homicidal Thoughts:Homicidal Thoughts: No  Sensorium  Memory: Immediate Fair; Recent Fair  Judgment: Fair  Insight: Fair  Chartered certified accountant: Fair  Attention Span: Fair  Recall: [employer] of Knowledge: Fair  Language: Fair  Psychomotor Activity  Psychomotor Activity: Psychomotor Activity: Normal  Assets  Assets: Communication Skills; Desire for Improvement; Physical Health; Resilience; Social Support; Housing  Sleep  Sleep: Sleep: Good Number of Hours of Sleep: 8  Physical Exam: Physical Exam Vitals and nursing note reviewed.  HENT:     Head: Normocephalic.     Nose: Nose normal.     Mouth/Throat:     Mouth: Mucous membranes are moist.     Pharynx: Oropharynx is clear.  Eyes:     Extraocular Movements: Extraocular movements intact.     Pupils: Pupils are equal, round, and reactive to light.  Cardiovascular:     Rate and Rhythm: Tachycardia present.     Comments: Blood pressure 119/72, pulse 101.  Nursing staff to recheck vital signs. Pulmonary:     Effort: Pulmonary effort is normal.  Abdominal:     Comments: Deferred  Genitourinary:    Comments:  Deferred Musculoskeletal:        General: Normal range of motion.     Cervical  back: Normal range of motion.  Skin:    General: Skin is warm.  Neurological:     General: No focal deficit present.     Mental Status: She is alert and oriented to person, place, and time.  Psychiatric:        Mood and Affect: Mood normal.        Behavior: Behavior normal.    Review of Systems  Constitutional:  Negative for chills and fever.  HENT:  Negative for sore throat.   Eyes: Negative.   Respiratory:  Negative for cough, shortness of breath and wheezing.   Cardiovascular:  Negative for chest pain and palpitations.  Gastrointestinal:  Negative for abdominal pain, heartburn, nausea and vomiting.  Genitourinary: Negative.   Musculoskeletal: Negative.   Skin:  Negative for itching and rash.  Neurological:  Negative for dizziness, tingling, tremors, sensory change, speech change, focal weakness, seizures, weakness and headaches.  Endo/Heme/Allergies:        See allergy listing  Psychiatric/Behavioral:  Positive for depression and hallucinations. The patient is nervous/anxious and has insomnia.    Blood pressure 119/72, pulse (!) 101, temperature 99.1 F (37.3 C), temperature source Oral, resp. rate 16, height 5\' 8"  (1.727 m), weight 108.6 kg. Body mass index is 36.4 kg/m.  COGNITIVE FEATURES THAT CONTRIBUTE TO RISK:  Polarized thinking    SUICIDE RISK:   Severe:  Frequent, intense, and enduring suicidal ideation, specific plan, no subjective intent, but some objective markers of intent (i.e., choice of lethal method), the method is accessible, some limited preparatory behavior, evidence of impaired self-control, severe dysphoria/symptomatology, multiple risk factors present, and few if any protective factors, particularly a lack of social support.  PLAN OF CARE: Treatment Plan Summary: Daily contact with patient to assess and evaluate symptoms and progress in treatment and Medication  management  Physician Treatment Plan for Primary Diagnosis:  Assessment: Schizoaffective disorder, bipolar type (HCC)  Plan: Medications: Initiate Abilify 5 mg p.o. daily for mood stabilization Continue trazodone 50 mg p.o. nightly as needed for insomnia Continue hydroxyzine 25 mg tablet p.o. 3 times daily as needed anxiety Initiate Simvastatin 10 mg p.o. at 1800 daily for hyperlipidemia  Agitation protocol: Benadryl capsule 50 mg p.o. or IM 3 times daily as needed agitation   Haldol tablets 5 mg po IM 3 times daily as needed agitation   Lorazepam tablet 2 mg p.o. or IM 3 times daily as needed agitation    Other PRN Medications -Acetaminophen 650 mg every 6 as needed/mild pain -Maalox 30 mL oral every 4 as needed/digestion -Magnesium hydroxide 30 mL daily as needed/mild constipation  -- The risks/benefits/side-effects/alternatives to this medication were discussed in detail with the patient and time was given for questions. The patient consents to medication trial.              -- Encouraged patient to participate  in unit milieu and in scheduled group therapies   WUJ:[id number] sinus rhythm with sinus arrhythmias, ventricular rate 67,      QT/QTc 380/401  Safety and Monitoring: Voluntary admission to inpatient psychiatric unit for safety, stabilization and treatment Daily contact with patient to assess and evaluate symptoms and progress in treatment Patient's case to be discussed in multi-disciplinary team meeting Observation Level : q15 minute checks Vital signs: q12 hours Precautions: suicide, but pt currently verbally contracts for safety on unit    Discharge Planning: Social work and case management to assist with discharge planning and identification of hospital follow-up needs prior to discharge Estimated  LOS: 5-7 days Discharge Concerns: Need to establish a safety plan; Medication compliance and effectiveness Discharge Goals: Return home with outpatient referrals for  mental health follow-up including medication management/psychotherapy.  Long Term Goal(s): Improvement in symptoms so as ready for discharge  Short Term Goals: Ability to identify changes in lifestyle to reduce recurrence of condition will improve, Ability to verbalize feelings will improve, Ability to disclose and discuss suicidal ideas, Ability to demonstrate self-control will improve, Ability to identify and develop effective coping behaviors will improve, Ability to maintain clinical measurements within normal limits will improve, Compliance with prescribed medications will improve, and Ability to identify triggers associated with substance abuse/mental health issues will improve  Physician Treatment Plan for Secondary Diagnosis: Principal Problem:   Schizoaffective disorder, bipolar type (HCC)  I certify that inpatient services furnished can reasonably be expected to improve the patient's condition.   Cecilie Lowers, FNP 12/12/2022, 2:30 PM

## 2022-12-12 NOTE — Group Note (Signed)
Date:  12/12/2022 Time:  10:13 AM  Group Topic/Focus:  Goals Group:   The focus of this group is to help patients establish daily goals to achieve during treatment and discuss how the patient can incorporate goal setting into their daily lives to aide in recovery. Orientation:   The focus of this group is to educate the patient on the purpose and policies of crisis stabilization and provide a format to answer questions about their admission.  The group details unit policies and expectations of patients while admitted.    Participation Level:  Active  Participation Quality:  Attentive  Affect:  Appropriate  Cognitive:  Appropriate  Insight: Appropriate  Engagement in Group:  Engaged  Modes of Intervention:  Discussion  Additional Comments:  Patient attended group and was attentive the duration of it. Patient's goal was to attend all groups.   Keanon Bevins T Logyn Kendrick 12/12/2022, 10:13 AM

## 2022-12-12 NOTE — H&P (Signed)
Psychiatric Admission Assessment Adult  Patient Identification: Barbara George MRN:  [id number] Date of Evaluation:  12/12/2022 Chief Complaint:  Schizoaffective disorder, bipolar type (HCC) [F25.0] Principal Diagnosis: Schizoaffective disorder, bipolar type (HCC) Diagnosis:  Principal Problem:   Schizoaffective disorder, bipolar type (HCC)  CC: "2 weeks ago, I listen to some music from the TV and started hearing voices again.  My husband took me to my father and they decided to take me to the hospital for my mental health evaluation."  History of Present Illness: Barbara George is a 27 y.o. [place]- American female with prior psychiatric history significant for schizoaffective disorder bipolar type who presents voluntarily to [facility] from [facility] after stabilization for worsening re-occurrence of auditory hallucinations.  During this evaluation, Barbara George reports that her mental illness started when she was 14 years at [place] in [place].  She reports increased relapse when she gets to [place] in 2014 and has been treated in multiple behavioral health hospitals including [place] [facility], [place] [facility], [place] [facility], however, does not remember the years.  Patient chart review indicates, patient was admitted in March 2018 at [facility], in April 2018 patient was admitted at [facility], in May 2020 patient was admitted at [facility], and in September 2021 patient was admitted and treated at [facility].  Patient reports current manic episode with psychotic features, including decreased need for sleep, elevated energy, engaging in multiple tasks, as well as disorganized thoughts and speech, racing thoughts, ongoing for the past 3 to 4 weeks.   She reports depressive episodes to include difficulty concentrating, increased appetite, too much or too  little sleep for the past 2 weeks.  Reports anxiety symptoms to include tension and worrying.  And psychotic symptoms to include delusions grossly disorganized speech and hallucinations greater than 6 weeks.  She denies trauma, obsessions, compulsions, inattention, hyperactivity, impulsiveness, or emotional irregularities.    Patient reports she has been on trial medication of Abilify p.o./Abilify Maintena from 2018-2020 but prefers the p.o. rather than the LAI.  She has also been Haldol and Risperdal in the past.  Patient reports that she prefers some antipsychotic that will not increase her weight gain due to her type 2 diabetes and antipsychotic that will not affect pregnancy should she become pregnant.  Risk assessment the chart review: A suicide and violence risk assessment was performed as part of this evaluation. There patient is deemed to be at chronic elevated risk for self-harm/suicide given the following factors: impulsive tendencies, agitation, history of schizoaffective disorder, and poor adherence to treatment. These risk factors are mitigated by the following factors: lack of active SI/HI, no known access to weapons or firearms, no history of previous suicide attempts, no history of violence, motivation for treatment, supportive family, sense of responsibility to family and social supports, presence of a significant relationship, presence of an available support system, safe housing, and support system in agreement with treatment recommendations. The patient is deemed to be at chronic elevated risk for violence given the following factors: agitation, active symptoms of psychosis, and active symptoms of mania. These risk factors are mitigated by the following factors: no known history of violence towards others, no known history of threats of harm towards others, no command hallucinations to harm others in the last 6 months, religiosity, and connectedness to family. There is no acute risk for  suicide or violence at this time. The patient was educated  about relevant modifiable risk factors including following recommendations for treatment of psychiatric illness and abstaining from substance abuse.  While future psychiatric events cannot be accurately predicted, the patient does currently require acute inpatient psychiatric care, willing to go to observation unit to be started on medications and reassessed, but does not currently meet [facility] involuntary commitment criteria, as she is willingly seeking help. Should inpatient hospitalization be recommended by team assuming care, low threshold to IVC if patient is unwilling to go voluntarily.  Assessment: Patient is seen and examined on the unit sitting up on a chair.  She presents alert, oriented to person, place, time, and situation.  Patient reports, I have been here before few times for my mental illness."  Speech is clear, coherent with [place]  accent however, understandable.  Able to maintain good eye contact with this provider.  She reports mood as depressed and worried about her continues auditory hallucination. Exhibiting paranoia, thinking someone might have done something to her at childhood causing her to have schizoaffective disorder, bipolar type.  Patient does not seem to be responding to internal or external stimuli during this evaluation.  She denies SI, HI, or VH.  Endorses auditory hallucination of hearing Qran Muslim teaching and understanding the teaching.  Reports the auditory hallucination gets worse in the evening.  Vital signs reviewed within normal limits.  Admission labs reviewed CMP: BUN less than 5 low otherwise normal.  Lipid profile: Cholesterol 228 high, LDL 113 high, otherwise normal.  CBC with differential: Hemoglobin 11.4 low, MCV 76.6 low, MCH 23.6 low, platelets 405 high, otherwise normal.  Prolactin level: Within normal limits.  Hemoglobin A1c: 6.2 high.  TSH: within normal limits.  Patient is admitted for mood  stabilization, medication management, and safety.  Mode of transport to Hospital: Safe transport Current Outpatient (Home) Medication List: See home medication listing PRN medication prior to evaluation: See home medication listing  ED course: Patient came from [facility].  Lab will be obtained with EKG and patient disposition and after stabilization to behavioral health Hospital at good Collateral Information: None obtained at this time POA/Legal Guardian:  adamou,amadou Spouse   [phone]   Past Psychiatric Hx: Previous Psych Diagnoses:  History of schizoaffective disorder bipolar type Prior inpatient treatment: Multiple inpatient psychiatric treatment at [facility] [place], [place] in [place], [place] behavioral health patient does not remember the dates of the year.  Patient chart review, patient was admitted in March 2018 at [facility], in April 2018 patient was admitted at [facility] behavioral health, in May 2020 patient was admitted at [facility] behavioral health, and in September 2021 patient was admitted and treated at [facility]. Current/prior outpatient treatment: Receiving medication management at [facility] behavioral health urgent care denies Prior rehab hx: Denies Psychotherapy hx: Yes History of suicide: Denies history of suicide due to religious and cultural reasons History of homicide or aggression: Denies Psychiatric medication history: Patient has been on trial Zyprexa and Abilify Psychiatric medication compliance history: Noncompliance Neuromodulation history: Denies Current Psychiatrist: Denies Current therapist: Denies  Substance Abuse Hx: Alcohol: Denies Tobacco: Denies Illicit drugs: Denies Rx drug abuse: Denies Rehab hx: Denies  Past Medical History: Medical Diagnoses: Type 2 diabetes Home Rx: Yes, taking metformin Prior Hosp: Denies Prior Surgeries/Trauma: Denies Head trauma, LOC, concussions,  seizures: denies Allergies: No known drug allergies.  However patient is allergic to:          Reaction Severity Reaction Type Noted  Allergies    Peanut-containing Drug Products  Nausea And Vomiting Medium  08/08/2016      Pork-derived Products  Rash, Other (See Comments) Medium  08/09/2016  LMP: July 2024 Contraception: Denies PCP: Denies   Family History: Medical: Denies Psych: Grandfather has mental problems but patient is not sure of the type of Mental illness. Psych Rx: Denies SA/HA: Mother's sister committed suicide Substance use family hx: Denies  Social History: Childhood (bring, raised, lives now, parents, siblings, schooling, education): Some college Abuse: Denies Marital Status: Married Sexual orientation: Female from birth Children: No  children Employment: Unemployed Peer Group: Denies Housing: Yes, has housing Finances: No financial difficulties Legal: Denies Scientist, physiological: Denies serving in the [employer]  Associated Signs/Symptoms: Depression Symptoms:  depressed mood, insomnia, hypersomnia, anxiety, disturbed sleep, weight gain, increased appetite,  (Hypo) Manic Symptoms:  Delusions, Elevated Mood, Hallucinations, Irritable Mood,  Anxiety Symptoms:  Excessive Worry, Social Anxiety,  Psychotic Symptoms:  Delusions, Hallucinations: Auditory Paranoia,  PTSD Symptoms: NA  Total Time spent with patient: 45 minutes  Is the patient at risk to self? No.  Has the patient been a risk to self in the past 6 months? No.  Has the patient been a risk to self within the distant past? No.  Is the patient a risk to others? No.  Has the patient been a risk to others in the past 6 months? No.  Has the patient been a risk to others within the distant past? No.   [place] Scale:    Alcohol Screening: 1. How often do you have a drink containing alcohol?: Never 2. How many drinks containing alcohol do you have on a typical day when you are  drinking?: 1 or 2 3. How often do you have six or more drinks on one occasion?: Never AUDIT-C Score: 0 4. How often during the last year have you found that you were not able to stop drinking once you had started?: Never 5. How often during the last year have you failed to do what was normally expected from you because of drinking?: Never 6. How often during the last year have you needed a first drink in the morning to get yourself going after a heavy drinking session?: Never 7. How often during the last year have you had a feeling of guilt of remorse after drinking?: Never 8. How often during the last year have you been unable to remember what happened the night before because you had been drinking?: Never 9. Have you or someone else been injured as a result of your drinking?: No 10. Has a relative or friend or a doctor or another health worker been concerned about your drinking or suggested you cut down?: No Alcohol Use Disorder Identification Test Final Score (AUDIT): 0 Alcohol Brief Interventions/Follow-up: Alcohol education/Brief advice  Substance Abuse History in the last 12 months:  No.  Consequences of Substance Abuse: NA Previous Psychotropic Medications: Yes   Psychological Evaluations: Yes   Past Medical History:  Past Medical History:  Diagnosis Date   Bipolar 1 disorder (HCC)    Depression    Diabetes mellitus without complication (HCC)    Type 2 (Pre)   Schizophrenia (HCC)    History reviewed. No pertinent surgical history. Family History:  Family History  Problem Relation Age of Onset   Hypertension Father    Tobacco Screening:  Social History   Tobacco Use  Smoking Status Never  Smokeless Tobacco Never    BH Tobacco Counseling  Are you interested in Tobacco Cessation Medications?  No value filed. Counseled patient on smoking cessation:  No value filed. Reason Tobacco Screening Not Completed: No value filed.   Social History:  Social History    Substance and Sexual Activity  Alcohol Use No   Alcohol/week: 0.0 standard drinks of alcohol     Social History   Substance and Sexual Activity  Drug Use No    Additional Social History: Marital status: Married Number of Years Married: 5 What types of issues is patient dealing with in the relationship?: Denies any issues Additional relationship information: "... no" Are you sexually active?: Yes What is your sexual orientation?: Heterosexual Has your sexual activity been affected by drugs, alcohol, medication, or emotional stress?: N/A Does patient have children?: No    Allergies:   Allergies  Allergen Reactions   Peanut-Containing Drug Products Nausea And Vomiting   Pork-Derived Products Rash and Other (See Comments)    Religious purposes   Lab Results:  Results for orders placed or performed during the hospital encounter of 12/11/22 (from the past 48 hour(s))  CBC with Differential/Platelet     Status: Abnormal   Collection Time: 12/11/22 11:19 AM  Result Value Ref Range   WBC 6.6 4.0 - 10.5 K/uL   RBC 4.83 3.87 - 5.11 MIL/uL   Hemoglobin 11.4 (L) 12.0 - 15.0 g/dL   HCT 16.1 09.6 - 04.5 %   MCV 76.6 (L) 80.0 - 100.0 fL   MCH 23.6 (L) 26.0 - 34.0 pg   MCHC 30.8 30.0 - 36.0 g/dL   RDW 40.9 81.1 - 91.4 %   Platelets 405 (H) 150 - 400 K/uL   nRBC 0.0 0.0 - 0.2 %   Neutrophils Relative % 49 %   Neutro Abs 3.3 1.7 - 7.7 K/uL   Lymphocytes Relative 41 %   Lymphs Abs 2.7 0.7 - 4.0 K/uL   Monocytes Relative 7 %   Monocytes Absolute 0.5 0.1 - 1.0 K/uL   Eosinophils Relative 2 %   Eosinophils Absolute 0.1 0.0 - 0.5 K/uL   Basophils Relative 1 %   Basophils Absolute 0.0 0.0 - 0.1 K/uL   Immature Granulocytes 0 %   Abs Immature Granulocytes 0.01 0.00 - 0.07 K/uL    Comment: Performed at [facility], [address] [place], [place] [address]  Comprehensive metabolic panel     Status: Abnormal   Collection Time: 12/11/22 11:19 AM  Result Value Ref Range    Sodium 138 135 - 145 mmol/L   Potassium 4.4 3.5 - 5.1 mmol/L   Chloride 103 98 - 111 mmol/L   CO2 26 22 - 32 mmol/L   Glucose, Bld 95 70 - 99 mg/dL    Comment: Glucose reference range applies only to samples taken after fasting for at least 8 hours.   BUN <5 (L) 6 - 20 mg/dL   Creatinine, Ser 6.21 0.44 - 1.00 mg/dL   Calcium 30.8 8.9 - 65.7 mg/dL   Total Protein 7.5 6.5 - 8.1 g/dL   Albumin 3.6 3.5 - 5.0 g/dL   AST 18 15 - 41 U/L   ALT 14 0 - 44 U/L   Alkaline Phosphatase 61 38 - 126 U/L   Total Bilirubin 0.4 0.3 - 1.2 mg/dL   GFR, Estimated >84 >69 mL/min    Comment: (NOTE) Calculated using the CKD-EPI Creatinine Equation (2021)    Anion gap 9 5 - 15    Comment: Performed at [facility],  [address], [place], [place] [address]  Hemoglobin A1c     Status: Abnormal   Collection Time: 12/11/22 11:19 AM  Result Value Ref Range   Hgb A1c MFr Bld 6.2 (H) 4.8 - 5.6 %    Comment: (NOTE)         Prediabetes: 5.7 - 6.4         Diabetes: >6.4         Glycemic control for adults with diabetes: <7.0    Mean Plasma Glucose 131 mg/dL    Comment: (NOTE) Performed At: [facility] [address] [place], [place] [id number] Jolene Schimke MD HQ:[id number]   Ethanol     Status: None   Collection Time: 12/11/22 11:19 AM  Result Value Ref Range   Alcohol, Ethyl (B) <10 <10 mg/dL    Comment: (NOTE) Lowest detectable limit for serum alcohol is 10 mg/dL.  For medical purposes only. Performed at [facility], [address], [place], [place] [address]   Lipid panel     Status: Abnormal   Collection Time: 12/11/22 11:19 AM  Result Value Ref Range   Cholesterol 228 (H) 0 - 200 mg/dL   Triglycerides 010 <272 mg/dL   HDL 91 >53 mg/dL   Total CHOL/HDL Ratio 2.5 RATIO   VLDL 24 0 - 40 mg/dL   LDL Cholesterol 664 (H) 0 - 99 mg/dL    Comment:        Total Cholesterol/HDL:CHD Risk Coronary Heart Disease Risk Table                     Men   Women  1/2 Average Risk    3.4   3.3  Average Risk       5.0   4.4  2 X Average Risk   9.6   7.1  3 X Average Risk  23.4   11.0        Use the calculated Patient Ratio above and the CHD Risk Table to determine the patient's CHD Risk.        ATP III CLASSIFICATION (LDL):  <100     mg/dL   Optimal  403-474  mg/dL   Near or Above                    Optimal  130-159  mg/dL   Borderline  259-563  mg/dL   High  >875     mg/dL   Very High Performed at [facility], [address] [place], [place] [address]   Prolactin     Status: None   Collection Time: 12/11/22 11:19 AM  Result Value Ref Range   Prolactin 13.0 4.8 - 33.4 ng/mL    Comment: (NOTE) Performed At: [facility] [address] [place], [place] [id number] Jolene Schimke MD AY:[id number]   TSH     Status: None   Collection Time: 12/11/22 11:19 AM  Result Value Ref Range   TSH 0.592 0.350 - 4.500 uIU/mL    Comment: Performed by a 3rd Generation assay with a functional sensitivity of <=0.01 uIU/mL. Performed at [facility], [address] [place], [place] [address]    Blood Alcohol level:  Lab Results  Component Value Date   [facility] <10 12/11/2022   ETH <10 02/20/2020   Metabolic Disorder Labs:  Lab Results  Component Value Date   HGBA1C 6.2 (H) 12/11/2022   MPG 131 12/11/2022   MPG 111.15 10/23/2018  Lab Results  Component Value Date   PROLACTIN 13.0 12/11/2022   PROLACTIN CANCELED 09/17/2022   Lab Results  Component Value Date   CHOL 228 (H) 12/11/2022   TRIG 122 12/11/2022   HDL 91 12/11/2022   CHOLHDL 2.5 12/11/2022   VLDL 24 12/11/2022   LDLCALC 113 (H) 12/11/2022   LDLCALC 95 10/23/2018   Current Medications: Current Facility-Administered Medications  Medication Dose Route Frequency Provider Last Rate Last Admin   acetaminophen (TYLENOL) tablet 650 mg  650 mg Oral Q6H PRN Peterson Ao, MD   650 mg at 12/12/22 0222   alum & mag hydroxide-simeth (MAALOX/MYLANTA) 200-200-20 MG/5ML suspension 30 mL  30  mL Oral Q4H PRN Peterson Ao, MD       diphenhydrAMINE (BENADRYL) capsule 50 mg  50 mg Oral TID PRN Peterson Ao, MD   50 mg at 12/12/22 0407   Or   diphenhydrAMINE (BENADRYL) injection 50 mg  50 mg Intramuscular TID PRN Peterson Ao, MD       haloperidol (HALDOL) tablet 5 mg  5 mg Oral TID PRN Peterson Ao, MD   5 mg at 12/12/22 0406   Or   haloperidol lactate (HALDOL) injection 5 mg  5 mg Intramuscular TID PRN Peterson Ao, MD       hydrOXYzine (ATARAX) tablet 25 mg  25 mg Oral TID PRN Peterson Ao, MD       LORazepam (ATIVAN) tablet 2 mg  2 mg Oral TID PRN Peterson Ao, MD   2 mg at 12/12/22 0407   Or   LORazepam (ATIVAN) injection 2 mg  2 mg Intramuscular TID PRN Peterson Ao, MD       magnesium hydroxide (MILK OF MAGNESIA) suspension 30 mL  30 mL Oral Daily PRN Peterson Ao, MD       traZODone (DESYREL) tablet 50 mg  50 mg Oral QHS PRN Peterson Ao, MD       PTA Medications: Medications Prior to Admission  Medication Sig Dispense Refill Last Dose   OLANZapine (ZYPREXA) 15 MG tablet Take 1 tablet (15 mg total) by mouth at bedtime. 30 tablet 3    Musculoskeletal: Strength & Muscle Tone: within normal limits Gait & Station: normal Patient leans: N/A  Psychiatric Specialty Exam:  Presentation  General Appearance:  Appropriate for Environment; Casual; Fairly Groomed  Eye Contact: Good  Speech: Clear and Coherent  Speech Volume: Normal  Handedness: Right  Mood and Affect  Mood: Euphoric  Affect: Congruent  Thought Process  Thought Processes: Disorganized  Duration of Psychotic Symptoms: 13 years   Past Diagnosis of Schizophrenia or Psychoactive disorder: Yes  Descriptions of Associations:Loose  Orientation:Full (Time, Place and Person)  Thought Content:Scattered; Rumination; Delusions; Paranoid Ideation  Hallucinations:Hallucinations: Auditory Description of Auditory Hallucinations: Hearing people reading quran and she  follows along reading with them  Ideas of Reference:None  Suicidal Thoughts:Suicidal Thoughts: No  Homicidal Thoughts:Homicidal Thoughts: No  Sensorium  Memory: Immediate Fair; Recent Fair  Judgment: Fair  Insight: Fair  Chartered certified accountant: Fair  Attention Span: Fair  Recall: [employer] of Knowledge: Fair  Language: Fair  Psychomotor Activity  Psychomotor Activity: Psychomotor Activity: Normal  Assets  Assets: Communication Skills; Desire for Improvement; Physical Health; Resilience; Social Support; Housing  Sleep  Sleep: Sleep: Good Number of Hours of Sleep: 8  Physical Exam: Physical Exam Vitals and nursing note reviewed.  Constitutional:      Appearance: She is obese.  HENT:     Head: Normocephalic.  Nose: Nose normal.     Mouth/Throat:     Mouth: Mucous membranes are moist.     Pharynx: Oropharynx is clear.  Eyes:     Extraocular Movements: Extraocular movements intact.     Pupils: Pupils are equal, round, and reactive to light.  Cardiovascular:     Rate and Rhythm: Tachycardia present.  Pulmonary:     Effort: Pulmonary effort is normal.  Abdominal:     Comments: Deferred  Genitourinary:    Comments: Deferred Musculoskeletal:        General: Normal range of motion.     Cervical back: Normal range of motion.  Skin:    General: Skin is warm.  Neurological:     General: No focal deficit present.     Mental Status: She is alert and oriented to person, place, and time.  Psychiatric:        Mood and Affect: Mood normal.        Behavior: Behavior normal.   Review of Systems  Constitutional:  Negative for chills and fever.  HENT:  Negative for sore throat.   Eyes: Negative.   Respiratory:  Negative for cough, shortness of breath and wheezing.   Cardiovascular:  Negative for chest pain and palpitations.  Gastrointestinal:  Negative for abdominal pain, diarrhea, heartburn, nausea and vomiting.  Genitourinary:  Negative.   Musculoskeletal: Negative.   Skin:  Negative for itching and rash.  Neurological:  Negative for dizziness, tingling, tremors, sensory change, speech change, focal weakness, seizures, loss of consciousness, weakness and headaches.  Endo/Heme/Allergies:        See allergy listing  Psychiatric/Behavioral:  Positive for depression and hallucinations. The patient is nervous/anxious and has insomnia.    Blood pressure 119/72, pulse (!) 101, temperature 99.1 F (37.3 C), temperature source Oral, resp. rate 16, height 5\' 8"  (1.727 m), weight 108.6 kg. Body mass index is 36.4 kg/m.  Treatment Plan Summary: Daily contact with patient to assess and evaluate symptoms and progress in treatment and Medication management  Physician Treatment Plan for Primary Diagnosis:  Assessment: Schizoaffective disorder, bipolar type (HCC)  Plan: Medications: Initiate Abilify 5 mg p.o. daily for mood stabilization Continue trazodone 50 mg p.o. nightly as needed for insomnia Continue hydroxyzine 25 mg tablet p.o. 3 times daily as needed anxiety Initiate Simvastatin 10 mg p.o. at 1800 daily for hyperlipidemia  Agitation protocol: Benadryl capsule 50 mg p.o. or IM 3 times daily as needed agitation   Haldol tablets 5 mg po IM 3 times daily as needed agitation   Lorazepam tablet 2 mg p.o. or IM 3 times daily as needed agitation    Other PRN Medications -Acetaminophen 650 mg every 6 as needed/mild pain -Maalox 30 mL oral every 4 as needed/digestion -Magnesium hydroxide 30 mL daily as needed/mild constipation  -- The risks/benefits/side-effects/alternatives to this medication were discussed in detail with the patient and time was given for questions. The patient consents to medication trial.              -- Encouraged patient to participate  in unit milieu and in scheduled group therapies   WUJ:[id number] sinus rhythm with sinus arrhythmias, ventricular rate 67,      QT/QTc 380/401  Safety and  Monitoring: Voluntary admission to inpatient psychiatric unit for safety, stabilization and treatment Daily contact with patient to assess and evaluate symptoms and progress in treatment Patient's case to be discussed in multi-disciplinary team meeting Observation Level : q15 minute checks Vital signs: q12 hours Precautions: suicide,  but pt currently verbally contracts for safety on unit    Discharge Planning: Social work and case management to assist with discharge planning and identification of hospital follow-up needs prior to discharge Estimated LOS: 5-7 days Discharge Concerns: Need to establish a safety plan; Medication compliance and effectiveness Discharge Goals: Return home with outpatient referrals for mental health follow-up including medication management/psychotherapy.  Long Term Goal(s): Improvement in symptoms so as ready for discharge  Short Term Goals: Ability to identify changes in lifestyle to reduce recurrence of condition will improve, Ability to verbalize feelings will improve, Ability to disclose and discuss suicidal ideas, Ability to demonstrate self-control will improve, Ability to identify and develop effective coping behaviors will improve, Ability to maintain clinical measurements within normal limits will improve, Compliance with prescribed medications will improve, and Ability to identify triggers associated with substance abuse/mental health issues will improve  Physician Treatment Plan for Secondary Diagnosis: Principal Problem:   Schizoaffective disorder, bipolar type (HCC)  I certify that inpatient services furnished can reasonably be expected to improve the patient's condition.    Cecilie Lowers, FNP 7/10/20242:43 PM

## 2022-12-12 NOTE — Progress Notes (Signed)
   12/12/22 2115  Psych Admission Type (Psych Patients Only)  Admission Status Voluntary  Psychosocial Assessment  Patient Complaints Anxiety  Eye Contact Fair  Facial Expression Anxious  Affect Preoccupied  Speech Logical/coherent  Interaction Cautious  Motor Activity Slow  Appearance/Hygiene Unremarkable  Behavior Characteristics Guarded  Mood Anxious  Aggressive Behavior  Effect No apparent injury  Thought Process  Coherency Circumstantial;Disorganized  Content Preoccupation  Delusions None reported or observed  Perception WDL  Hallucination None reported or observed  Judgment Impaired  Confusion None  Danger to Self  Current suicidal ideation? Denies

## 2022-12-12 NOTE — Progress Notes (Signed)
Pt constantly coming out her room asking for various things from food to wanting to take showers during the night. Pt began walking up the hallway making noise beginning to wake other patients up. Pt was informed that she could not disturb other patients while they are sleeping, pt stated"why not" pt offered PO agitation protocol and pt took it without incident

## 2022-12-12 NOTE — Group Note (Signed)
Recreation Therapy Group Note   Group Topic:Communication  Group Date: 12/12/2022 Start Time: 1035 End Time: 1100 Facilitators: Dmarion Perfect-McCall, LRT,CTRS Location: [address] Dayroom   Goal Area(s) Addresses:  Patient will effectively listen to complete activity.  Patient will identify communication skills used to make activity successful.  Patient will identify how skills used during activity can be used to reach post d/c goals.    Group Descripiton: Geometric Drawings.  Three volunteers from the peer group will be shown an abstract picture with a particular arrangement of geometrical shapes.  Each round, one 'speaker' will describe the pattern, as accurately as possible without revealing the image to the group.  The remaining group members will listen and draw the picture to reflect how it is described to them. Patients with the role of 'listener' cannot ask clarifying questions but, may request that the speaker repeat a direction. Once the drawings are complete, the presenter will show the rest of the group the picture and compare how close each person came to drawing the picture. LRT will facilitate a post-activity discussion regarding effective communication and the importance of planning, listening, and asking for clarification in daily interactions with others.   Affect/Mood: Appropriate   Participation Level: Engaged   Participation Quality: Independent   Behavior: Appropriate   Speech/Thought Process: Focused   Insight: Good   Judgement: Good   Modes of Intervention: Activity   Patient Response to Interventions:  Engaged   Education Outcome:  Acknowledges education   Clinical Observations/Individualized Feedback: Pt was bright and engaged. Pt was the second presenter in group. Pt spoke as clear as possible so peers could understand her instructions through her accent. Pt was interactive and focused during group session.     Plan: Continue to engage patient in  RT group sessions 2-3x/week.   Mikayla Chiusano-McCall, LRT,CTRS 12/12/2022 1:23 PM

## 2022-12-12 NOTE — [facility] Suicide Risk Assessment (Signed)
[facility] INPATIENT:  Family/Significant Other Suicide Prevention Education  Suicide Prevention Education:  Education Completed; husband, Adamimou, Adamamou [phone] and father Hamidou Hamadou [phone]  (name of family member/significant other) has been identified by the patient as the family member/significant other with whom the patient will be residing, and identified as the person(s) who will aid the patient in the event of a mental health crisis (suicidal ideations/suicide attempt).  With written consent from the patient, the family member/significant other has been provided the following suicide prevention education, prior to the and/or following the discharge of the patient.  Safety planning completed with pt's husband and father.  Pt's husband reported there are no safety concerns with pt returning home. There are no guns in the home. Husband reported he will secure home surrounding knives, sharp objects and medications. Reported concerns is that pt is not sleeping and is not taking her medications.   Safety planning also completed with pt's father who also has no safety concerns surrounding pt returning home. Father reported that he has the responsibility of "watching the pt when she is not taking medications and her behaviors are a concern" Father also reported his health is declining as a result of caring for pt. Father requested medications to help pt sleep.   The suicide prevention education provided includes the following: Suicide risk factors Suicide prevention and interventions National Suicide Hotline telephone number [facility] assessment telephone number [facility] Emergency Assistance 911 [facility] and/or Residential Mobile Crisis Unit telephone number  Request made of family/significant other to: Remove weapons (e.g., guns, rifles, knives), all items previously/currently identified as safety concern.   Remove drugs/medications (over-the-counter,  prescriptions, illicit drugs), all items previously/currently identified as a safety concern.  The family member/significant other verbalizes understanding of the suicide prevention education information provided.  The family member/significant other agrees to remove the items of safety concern listed above.  Kylan Liberati R 12/12/2022, 1:58 PM

## 2022-12-12 NOTE — [facility] Group Notes (Signed)
Adult Psychoeducational Group Note  Date:  12/12/2022 Time:  8:52 PM  Group Topic/Focus:  Wrap-Up Group:   The focus of this group is to help patients review their daily goal of treatment and discuss progress on daily workbooks.  Participation Level:  Active  Participation Quality:  Appropriate  Affect:  Appropriate  Cognitive:  Appropriate  Insight: Appropriate  Engagement in Group:  Engaged  Modes of Intervention:  Discussion  Additional Comments:  Pt describes her day as "great" stating that her medication has been helping her and she's been getting better sleep while being here. Pt had a visit from her husband which she enjoyed. Pt denies everything and states that she's proud of herself for being more social today   Barbara George 12/12/2022, 8:52 PM

## 2022-12-13 DIAGNOSIS — F250 Schizoaffective disorder, bipolar type: Secondary | ICD-10-CM | POA: Diagnosis not present

## 2022-12-13 MED ORDER — ARIPIPRAZOLE 10 MG PO TABS
10.0000 mg | ORAL_TABLET | Freq: Every day | ORAL | Status: DC
  Administered 2022-12-14: 10 mg via ORAL
  Filled 2022-12-13 (×3): qty 1

## 2022-12-13 MED ORDER — TRAZODONE HCL 100 MG PO TABS: 100.0000 mg | ORAL_TABLET | Freq: Every evening | ORAL | Status: DC | PRN

## 2022-12-13 MED ORDER — TRAZODONE HCL 100 MG PO TABS
100.0000 mg | ORAL_TABLET | Freq: Every day | ORAL | Status: DC
  Administered 2022-12-13: 100 mg via ORAL
  Filled 2022-12-13 (×4): qty 1

## 2022-12-13 NOTE — Group Note (Signed)
Date:  12/13/2022 Time:  1:18 PM  Group Topic/Focus:  Making Healthy Choices:   The focus of this group is to help patients identify negative/unhealthy choices they were using prior to admission and identify positive/healthier coping strategies to replace them upon discharge.    Participation Level:  Active  Participation Quality:  Appropriate  Affect:  Appropriate  Cognitive:  Appropriate  Insight: Appropriate  Engagement in Group:  Engaged  Modes of Intervention:  Discussion  Additional Comments:  Patient attended music therapy & participated.     Pavle Wiler W Abi Shoults 12/13/2022, 1:18 PM

## 2022-12-13 NOTE — Group Note (Signed)
Date:  12/13/2022 Time:  11:52 AM  Group Topic/Focus:  Goals Group:   The focus of this group is to help patients establish daily goals to achieve during treatment and discuss how the patient can incorporate goal setting into their daily lives to aide in recovery. Orientation:   The focus of this group is to educate the patient on the purpose and policies of crisis stabilization and provide a format to answer questions about their admission.  The group details unit policies and expectations of patients while admitted.    Participation Level:  Active  Participation Quality:  Appropriate  Affect:  Appropriate  Cognitive:  Appropriate  Insight: Appropriate  Engagement in Group:  Engaged  Modes of Intervention:  Discussion  Additional Comments:  Patient was disorganized with her thoughts and did not give any goal.   Barbara George Skanda Worlds 12/13/2022, 11:52 AM

## 2022-12-13 NOTE — Plan of Care (Signed)
  Problem: Education: Goal: Knowledge of [place] Education information/materials will improve Outcome: Progressing Goal: Mental status will improve Outcome: Progressing   Problem: Coping: Goal: Ability to demonstrate self-control will improve Outcome: Progressing   Problem: Coping: Goal: Coping ability will improve Outcome: Progressing   Problem: Safety: Goal: Ability to redirect hostility and anger into socially appropriate behaviors will improve Outcome: Progressing

## 2022-12-13 NOTE — Progress Notes (Signed)
Pt very childlike ,restless and hypomanic needing frequent re-direction   12/13/22 1945  Psychosocial Assessment  Patient Complaints Restlessness  Eye Contact Fair  Facial Expression Anxious  Affect Preoccupied;Silly  Speech Logical/coherent  Interaction Intrusive;Childlike  Motor Activity Slow  Appearance/Hygiene Unremarkable  Behavior Characteristics Cooperative;Fidgety;Restless  Aggressive Behavior  Effect No apparent injury  Thought Process  Coherency Circumstantial;Disorganized  Content Preoccupation  Delusions None reported or observed  Perception WDL  Hallucination None reported or observed  Judgment Impaired  Confusion None  Danger to Self  Current suicidal ideation? Denies

## 2022-12-13 NOTE — Group Note (Signed)
Recreation Therapy Group Note   Group Topic:Healthy Decision Making  Group Date: 12/13/2022 Start Time: 1000 End Time: 1040 Facilitators: Kerly Rigsbee-McCall, LRT,CTRS Location: [address] Dayroom   Goal Area(s) Addresses:  Patient will effectively work with peer towards shared goal.  Patient will identify factors that guided their decision making.  Patient will pro-socially communicate ideas during group session.   Group Description: Patients were given a scenario that they were going to be stranded on a deserted [place] for several months before being rescued. Writer tasked them with making a list of 15 things they would choose to bring with them for "survival". The list of items was prioritized most important to least. Each patient would come up with their own list, then work together to create a new list of 15 items while in a group of 3-5 peers. LRT discussed each person's list and how it differed from others. The debrief included discussion of priorities, good decisions versus bad decisions, and how it is important to think before acting so we can make the best decision possible. LRT tied the concept of effective communication among group members to patient's support systems outside of the hospital and its benefit post discharge.   Affect/Mood: Happy   Participation Level: Active   Participation Quality: Independent   Behavior: Cooperative   Speech/Thought Process: Relevant   Insight: Fair   Judgement: Fair    Modes of Intervention: Activity   Patient Response to Interventions:  Receptive   Education Outcome:  Acknowledges education   Clinical Observations/Individualized Feedback: Pt was very upbeat and happy during group session. Pt didn't understand the concept of the activity in the beginning, to which pt was doing math problems. Once pt got what the activity was about, pt stated she would take her heart, shoes, baby, her love, beauty and respect. Through the course  of the discussion and how you have to be ready for the consequences of the decisions you make, pt stated "God makes the decisions".     Plan: Continue to engage patient in RT group sessions 2-3x/week.   Jaymie Mckiddy-McCall, LRT,CTRS 12/13/2022 12:40 PM

## 2022-12-13 NOTE — Progress Notes (Signed)
Pt requesting more medication after receiving her HS medication, pt informed she would have to allow the medication to work. Pt appears to have med seeking behaviors at times.

## 2022-12-13 NOTE — [facility] Counselor (Signed)
Adult Comprehensive Assessment  Patient ID: Barbara George, female   DOB: 07-29-1995, 27 y.o.   MRN: [id number]  Information Source: Information source: Patient (PSA complted with pt)  Current Stressors:  Patient states their primary concerns and needs for treatment are:: "... I miss my husband, not hearing or seeing him" Patient states their goals for this hospitilization and ongoing recovery are:: "... my goal is to go back home" Educational / Learning stressors: No Employment / Job issues: "No, I have never had a job" Family Relationships: No Financial / Lack of resources (include bankruptcy): "... yes, I was recieving $900 a month it has been cut back to $400, they said I was overpaid" Housing / Lack of housing: No Physical health (include injuries & life threatening diseases): No Social relationships: No Substance abuse: No Bereavement / Loss: No  Living/Environment/Situation:  Living Arrangements: Spouse/significant other Living conditions (as described by patient or guardian): " we live in a 2 bdrm apt" Who else lives in the home?: "husband and husband's nephew he is 61" How long has patient lived in current situation?: 5 yrs What is atmosphere in current home: Comfortable  Family History:  Marital status: Married Number of Years Married: 5 What types of issues is patient dealing with in the relationship?: Denies any issues Additional relationship information: "... no" Are you sexually active?: Yes What is your sexual orientation?: Heterosexual Has your sexual activity been affected by drugs, alcohol, medication, or emotional stress?: N/A Does patient have children?: No  Childhood History:  By whom was/is the patient raised?: Both parents, Grandparents Additional childhood history information: Grew up in [place] Description of patient's relationship with caregiver when they were a child: Sometimes good and sometimes bad with parents  How were you disciplined when  you got in trouble as a child/adolescent?: States she was well behaved Did patient suffer any verbal/emotional/physical/sexual abuse as a child?: No Did patient suffer from severe childhood neglect?: No Has patient ever been sexually abused/assaulted/raped as an adolescent or adult?: No Was the patient ever a victim of a crime or a disaster?: No Witnessed domestic violence?: No Has patient been affected by domestic violence as an adult?: No  Education:  Highest grade of school patient has completed: 12th Currently a student?: Yes Name of school: [employer] How long has the patient attended?: 9 mths Learning disability?: No  Employment/Work Situation:   Employment Situation: Surveyor, minerals Job has Been Impacted by Current Illness: No What is the Longest Time Patient has Held a Job?: Never worked  Where was the Patient Employed at that Time?: Never worked  Has Patient ever Been in Equities trader?: No  Financial Resources:   Surveyor, quantity resources: Writer Does patient have a Lawyer or guardian?: No  Alcohol/Substance Abuse:   What has been your use of drugs/alcohol within the last 12 months?: "none" If attempted suicide, did drugs/alcohol play a role in this?: No Alcohol/Substance Abuse Treatment Hx: Denies past history If yes, describe treatment: na Has alcohol/substance abuse ever caused legal problems?: No  Social Support System:   Conservation officer, nature Support System: Fair Development worker, community Support System: " psychiatrist Type of faith/religion: Islam  Leisure/Recreation:      Strengths/Needs:   What is the patient's perception of their strengths?: " I am very kind" Patient states these barriers may affect/interfere with their treatment: " none" Patient states these barriers may affect their return to the community: " none" Other important information patient would like considered in planning for their  treatment: None  Discharge Plan:   Currently  receiving community mental health services: Yes (From Whom) Patient states concerns and preferences for aftercare planning are: 'OPT and med mgmt" Patient states they will know when they are safe and ready for discharge when: " I am ready now I feel so much better" Does patient have access to transportation?: Yes Does patient have financial barriers related to discharge medications?: No Patient description of barriers related to discharge medications: " I have insurance" Will patient be returning to same living situation after discharge?: Yes  Summary/Recommendations:   Summary and Recommendations (to be completed by the evaluator): Barbara George is a 27 y.o. female voluntarily admitted to [facility] after presenting to [facility] due to manic episodes with psychotic features. CSW given permission to speak with husband and father. CSW spoke with husband and father who reported that pt has been experiencing decreased sleep for the past 3-4 weeks. Pt shared that she has been hearing voices and seeing things that others do not see. Pt reported no stressors. Pt's father reported that he must watch/supervise her due to her not sleeping and her impulsive behaviors. Father stated, "the problem is her lack of sleep" Pt denies SI/HI/AVH. Per chart review pt diagnosis is schizoaffective disorder, bipolar type.  Pt reports no alcohol use. Pt's father reported  pt will become mentally stable then she will not take medications. Pt is non-compliant with medicaitons. Pt's father reported pt has been hospitalized at least 6-7 times. Pt reported her mental health issues started when she was [age] years old. Patient will benefit from crisis stabilization, medication evaluation, group therapy and psychoeducation, in addition to case management for discharge planning. At discharge it is recommended that Patient adhere to the established discharge plan and continue in treatment.  Barbara George R. 12/13/2022

## 2022-12-13 NOTE — Plan of Care (Signed)
  Problem: Education: Goal: Emotional status will improve Outcome: Progressing Goal: Mental status will improve Outcome: Progressing   Problem: Coping: Goal: Ability to verbalize frustrations and anger appropriately will improve Outcome: Progressing   Problem: Role Relationship: Goal: Ability to interact with others will improve Outcome: Progressing

## 2022-12-13 NOTE — Group Note (Signed)
Occupational Therapy Group Note  Group Topic: Sleep Hygiene  Group Date: 12/13/2022 Start Time: 1430 End Time: 1500 Facilitators: Ted Mcalpine, OT   Group Description: Group encouraged increased participation and engagement through topic focused on sleep hygiene. Patients reflected on the quality of sleep they typically receive and identified areas that need improvement. Group was given background information on sleep and sleep hygiene, including common sleep disorders. Group members also received information on how to improve one's sleep and introduced a sleep diary as a tool that can be utilized to track sleep quality over a length of time. Group session ended with patients identifying one or more strategies they could utilize or implement into their sleep routine in order to improve overall sleep quality.        Therapeutic Goal(s):  Identify one or more strategies to improve overall sleep hygiene  Identify one or more areas of sleep that are negatively impacted (sleep too much, too little, etc)     Participation Level: Engaged   Participation Quality: Independent   Behavior: Appropriate   Speech/Thought Process: Relevant   Affect/Mood: Appropriate   Insight: Fair   Judgement: Fair      Modes of Intervention: Education  Patient Response to Interventions:  Engaged   Plan: Continue to engage patient in OT groups 2 - 3x/week.  12/13/2022  Ted Mcalpine, OT   Kerrin Champagne, OT

## 2022-12-13 NOTE — Progress Notes (Addendum)
[facility] MD Progress Note  12/13/2022 3:39 PM Barbara George  MRN:  [id number]  Subjective:  Barbara George states, "I am not sick. I am here because I was hearing voices.  I tried calling my father and he will not pick-up."  Principal Problem: Schizoaffective disorder, bipolar type (HCC) Diagnosis: Principal Problem:   Schizoaffective disorder, bipolar type (HCC)  Reason for admission: Barbara George is a 27 y.o. [place]- American female with prior psychiatric history significant for schizoaffective disorder bipolar type who presents voluntarily to [facility] from [facility] after stabilization for worsening re-occurrence of auditory hallucinations.   Yesterday the psychiatry team made the following recommendations:  Increase Abilify from 5 mg to 10 mg p.o. daily for psychosis and mood stabilization Increase trazodone from 50 mg to 100 mg p.o. nightly for insomnia Continue hydroxyzine 25 mg tablet p.o. 3 times daily as needed anxiety Continue simvastatin 10 mg p.o. at 1800 daily for hyperlipidemia  On assessment today, the pt reports that her mood is "happy", mood is euphoric and observed smiling inappropriately. Reports that anxiety is manageable, reports as #5/10 with 10 being high severity. Nursing staff reports patient sleeping over 5 hours last night.  Trazodone increased to 100 mg p.o. q. nightly for insomnia. Appetite is good consuming all her meals Concentration is fair and participating in the assessment Energy level is moderate.  Completed taking a shower this morning, dressed and rubbing motions makes during examination. Denies suicidal thoughts.  Denies suicidal intent or plan.  Patient added, it is against my religion to commit suicide or harm someone else. Denies having any HI.  Denies having psychotic symptoms today, however, observed patient smiling unnecessarily during the assessment.  She did not appear to be responding to  internal stimuli.  Speech is slightly disorganized and tangential.  Presents with poor insight, reporting that she is not sick, but came to the hospital because she was hearing voices.  Denies having side effects to current psychiatric medications.   We discussed changes to current medication regimen, including increasing Abilify from 5 mg p.o. daily to 10 mg p.o. daily for mood stabilization and psychosis.  The plan is to start Abilify Maintena LAI to improve long-term compliance and decrease risk of decompensation at discharge.  Trazodone also increase from 50 mg p.o. q. nightly as needed to 100 mg p.o. nightly for insomnia.  Patient in agreement with medication adjustments.  Will monitor therapeutic effectiveness.  Further, discussed with patient that metformin was not resumed due to discontinuing trial Zyprexa she was taking in the past.  Discussed the following psychosocial stressors: Complementing patient for good hygiene and performing her ADLs.  Encouraged to eat meals in the dining room with other patients and to participate in therapeutic milieu and unit group activities.  Patient endorses instructions with smiles.  Total Time spent with patient: 45 minutes  Past Psychiatric History: Previous Psych Diagnoses:  History of schizoaffective disorder bipolar type Prior inpatient treatment: Multiple inpatient psychiatric treatment at [facility], [place] in [place], [place] behavioral health patient does not remember the dates of the year.  Patient chart review, patient was admitted in March 2018 at [facility], in April 2018 patient was admitted at [facility] behavioral health, in May 2020 patient was admitted at [facility] behavioral health, and in September 2021 patient was admitted and treated at [facility]. Current/prior outpatient treatment: Receiving medication management at [facility] behavioral health urgent care denies Prior  rehab  hx: Denies Psychotherapy hx: Yes History of suicide: Denies history of suicide due to religious and cultural reasons History of homicide or aggression: Denies Psychiatric medication history: Patient has been on trial Zyprexa and Abilify Psychiatric medication compliance history: Noncompliance Neuromodulation history: Denies Current Psychiatrist: Denies Current therapist: Denies  Past Medical History:  Past Medical History:  Diagnosis Date   Bipolar 1 disorder (HCC)    Depression    Diabetes mellitus without complication (HCC)    Type 2 (Pre)   Schizophrenia (HCC)    History reviewed. No pertinent surgical history. Family History:  Family History  Problem Relation Age of Onset   Hypertension Father    Family Psychiatric  History: See H&P  Social History:  Social History   Substance and Sexual Activity  Alcohol Use No   Alcohol/week: 0.0 standard drinks of alcohol     Social History   Substance and Sexual Activity  Drug Use No    Social History   Socioeconomic History   Marital status: Married    Spouse name: Barbara George   Number of children: Not on file   Years of education: Not on file   Highest education level: Associate degree: occupational, Scientist, product/process development, or vocational program  Occupational History   Occupation: unemployed  Tobacco Use   Smoking status: Never   Smokeless tobacco: Never  Vaping Use   Vaping status: Never Used  Substance and Sexual Activity   Alcohol use: No    Alcohol/week: 0.0 standard drinks of alcohol   Drug use: No   Sexual activity: Yes    Birth control/protection: None  Other Topics Concern   Not on file  Social History Narrative   ** Merged History Encounter **       Social Determinants of Health   Financial Resource Strain: Not on file  Food Insecurity: No Food Insecurity (12/11/2022)   Hunger Vital Sign    Worried About Running Out of Food in the Last Year: Never true    Ran Out of Food in the Last Year: Never true   Transportation Needs: No Transportation Needs (12/11/2022)   PRAPARE - Administrator, Civil Service (Medical): No    Lack of Transportation (Non-Medical): No  Physical Activity: Inactive (04/03/2019)   Exercise Vital Sign    Days of Exercise per Week: 0 days    Minutes of Exercise per Session: 0 min  Stress: Not on file  Social Connections: Socially Integrated (04/03/2019)   Social Connection and Isolation Panel [NHANES]    Frequency of Communication with Friends and Family: More than three times a week    Frequency of Social Gatherings with Friends and Family: Once a week    Attends Religious Services: More than 4 times per year    Active Member of [employer] or Organizations: Yes    Attends Engineer, structural: Not on file    Marital Status: Married   Additional Social History:    Sleep: Good  Appetite:  Good  Current Medications: Current Facility-Administered Medications  Medication Dose Route Frequency Provider Last Rate Last Admin   acetaminophen (TYLENOL) tablet 650 mg  650 mg Oral Q6H PRN Peterson Ao, MD   650 mg at 12/13/22 1126   alum & mag hydroxide-simeth (MAALOX/MYLANTA) 200-200-20 MG/5ML suspension 30 mL  30 mL Oral Q4H PRN Peterson Ao, MD       ARIPiprazole (ABILIFY) tablet 5 mg  5 mg Oral Daily Kaliana Albino, Jesusita Oka, FNP   5 mg at 12/13/22  0756   diphenhydrAMINE (BENADRYL) capsule 50 mg  50 mg Oral TID PRN Peterson Ao, MD   50 mg at 12/12/22 0407   Or   diphenhydrAMINE (BENADRYL) injection 50 mg  50 mg Intramuscular TID PRN Peterson Ao, MD       haloperidol (HALDOL) tablet 5 mg  5 mg Oral TID PRN Peterson Ao, MD   5 mg at 12/12/22 0406   Or   haloperidol lactate (HALDOL) injection 5 mg  5 mg Intramuscular TID PRN Peterson Ao, MD       hydrOXYzine (ATARAX) tablet 25 mg  25 mg Oral TID PRN Peterson Ao, MD   25 mg at 12/12/22 2026   LORazepam (ATIVAN) tablet 2 mg  2 mg Oral TID PRN Peterson Ao, MD   2 mg at 12/12/22 0407   Or    LORazepam (ATIVAN) injection 2 mg  2 mg Intramuscular TID PRN Peterson Ao, MD       magnesium hydroxide (MILK OF MAGNESIA) suspension 30 mL  30 mL Oral Daily PRN Peterson Ao, MD       simvastatin (ZOCOR) tablet 10 mg  10 mg Oral q1800 Cecilie Lowers, FNP   10 mg at 12/12/22 2026   traZODone (DESYREL) tablet 50 mg  50 mg Oral QHS PRN Peterson Ao, MD   50 mg at 12/12/22 2026   Lab Results: No results found for this or any previous visit (from the past 48 hour(s)).  Blood Alcohol level:  Lab Results  Component Value Date   ETH <10 12/11/2022   ETH <10 02/20/2020   Metabolic Disorder Labs: Lab Results  Component Value Date   HGBA1C 6.2 (H) 12/11/2022   MPG 131 12/11/2022   MPG 111.15 10/23/2018   Lab Results  Component Value Date   PROLACTIN 13.0 12/11/2022   PROLACTIN CANCELED 09/17/2022   Lab Results  Component Value Date   CHOL 228 (H) 12/11/2022   TRIG 122 12/11/2022   HDL 91 12/11/2022   CHOLHDL 2.5 12/11/2022   VLDL 24 12/11/2022   LDLCALC 113 (H) 12/11/2022   LDLCALC 95 10/23/2018   Physical Findings: AIMS:  , ,  ,  ,    CIWA:    COWS:     Musculoskeletal: Strength & Muscle Tone: within normal limits Gait & Station: normal Patient leans: N/A  Psychiatric Specialty Exam:  Presentation  General Appearance:  Appropriate for Environment; Casual; Fairly Groomed  Eye Contact: Good  Speech: Clear and Coherent  Speech Volume: Normal  Handedness: Right  Mood and Affect  Mood: Euphoric  Affect: Congruent  Thought Process  Thought Processes: Coherent  Descriptions of Associations:Tangential  Orientation:Full (Time, Place and Person)  Thought Content:Paranoid Ideation; Delusions  History of Schizophrenia/Schizoaffective disorder:Yes  Duration of Psychotic Symptoms:Greater than six months  Hallucinations:Hallucinations: -- (Denies today) Description of Auditory Hallucinations: Denies  Ideas of Reference:None  Suicidal  Thoughts:Suicidal Thoughts: No (Patient reports is against my religion for harm myself)  Homicidal Thoughts:Homicidal Thoughts: No (Reports is against my religion to harm someone)  Sensorium  Memory: Immediate Fair; Recent Fair  Judgment: Fair  Insight: Fair  Chartered certified accountant: Fair  Attention Span: Fair  Recall: Fair  Fund of Knowledge: Fair  Language: Fair  Psychomotor Activity  Psychomotor Activity: Psychomotor Activity: Normal  Assets  Assets: Manufacturing systems engineer; Desire for Improvement; Housing; Physical Health; Resilience; Social Support  Sleep  Sleep: Sleep: Good Number of Hours of Sleep: 5  Physical Exam: Physical Exam Vitals and nursing note  reviewed.  Constitutional:      Appearance: She is obese.  HENT:     Head: Normocephalic.     Nose: Nose normal.     Mouth/Throat:     Mouth: Mucous membranes are moist.  Eyes:     Extraocular Movements: Extraocular movements intact.     Pupils: Pupils are equal, round, and reactive to light.  Cardiovascular:     Rate and Rhythm: Tachycardia present.  Pulmonary:     Effort: Pulmonary effort is normal.  Abdominal:     Comments: Deferred  Genitourinary:    Comments: Deferred Musculoskeletal:        General: Normal range of motion.     Cervical back: Normal range of motion.  Skin:    General: Skin is warm.  Neurological:     General: No focal deficit present.     Mental Status: She is alert and oriented to person, place, and time.  Psychiatric:        Behavior: Behavior normal.     Comments: Smiling unnecessarily    Review of Systems  Constitutional:  Negative for chills and fever.  HENT:  Negative for sore throat.   Eyes: Negative.   Respiratory:  Negative for cough, shortness of breath and wheezing.   Cardiovascular:  Negative for chest pain and palpitations.       Blood pressure 126/98, pulse 104.  Patient is asymptomatic, nursing staff to recheck vital signs.   Gastrointestinal:  Negative for abdominal pain, heartburn, nausea and vomiting.  Genitourinary: Negative.   Musculoskeletal: Negative.   Skin:  Negative for itching and rash.  Neurological:  Negative for dizziness, tingling, tremors and headaches.  Endo/Heme/Allergies:        See allergy listing  Psychiatric/Behavioral:  Positive for depression and hallucinations. The patient is nervous/anxious and has insomnia.    Blood pressure (!) 126/98, pulse (!) 104, temperature 98.6 F (37 C), temperature source Oral, resp. rate 18, height 5\' 8"  (1.727 m), weight 108.6 kg, SpO2 100%. Body mass index is 36.4 kg/m.  Treatment Plan Summary: Daily contact with patient to assess and evaluate symptoms and progress in treatment and Medication management  Physician Treatment Plan for Primary Diagnosis:  Assessment: Schizoaffective disorder, bipolar type (HCC)   Plan: Medications: Increase Abilify from 5 mg to 10 mg p.o. daily for psychosis and mood stabilization Increase trazodone from 50 mg to 100 mg p.o. nightly for insomnia Continue hydroxyzine 25 mg tablet p.o. 3 times daily as needed anxiety Continue simvastatin 10 mg p.o. at 1800 daily for hyperlipidemia   Agitation protocol: Benadryl capsule 50 mg p.o. or IM 3 times daily as needed agitation   Haldol tablets 5 mg po IM 3 times daily as needed agitation   Lorazepam tablet 2 mg p.o. or IM 3 times daily as needed agitation     Other PRN Medications -Acetaminophen 650 mg every 6 as needed/mild pain -Maalox 30 mL oral every 4 as needed/digestion -Magnesium hydroxide 30 mL daily as needed/mild constipation   -- The risks/benefits/side-effects/alternatives to this medication were discussed in detail with the patient and time was given for questions. The patient consents to medication trial.              -- Encouraged patient to participate  in unit milieu and in scheduled group therapies    NUU:VOZDGU sinus rhythm with sinus arrhythmias,  ventricular rate 67, QT/QTc 380/401   Safety and Monitoring: Voluntary admission to inpatient psychiatric unit for safety, stabilization and treatment Daily  contact with patient to assess and evaluate symptoms and progress in treatment Patient's case to be discussed in multi-disciplinary team meeting Observation Level : q15 minute checks Vital signs: q12 hours Precautions: suicide, but pt currently verbally contracts for safety on unit    Discharge Planning: Social work and case management to assist with discharge planning and identification of hospital follow-up needs prior to discharge Estimated LOS: 5-7 days Discharge Concerns: Need to establish a safety plan; Medication compliance and effectiveness Discharge Goals: Return home with outpatient referrals for mental health follow-up including medication management/psychotherapy.   Long Term Goal(s): Improvement in symptoms so as ready for discharge   Short Term Goals: Ability to identify changes in lifestyle to reduce recurrence of condition will improve, Ability to verbalize feelings will improve, Ability to disclose and discuss suicidal ideas, Ability to demonstrate self-control will improve, Ability to identify and develop effective coping behaviors will improve, Ability to maintain clinical measurements within normal limits will improve, Compliance with prescribed medications will improve, and Ability to identify triggers associated with substance abuse/mental health issues will improve   Physician Treatment Plan for Secondary Diagnosis: Principal Problem:   Schizoaffective disorder, bipolar type (HCC)   I certify that inpatient services furnished can reasonably be expected to improve the patient's condition.    Cecilie Lowers, FNP 12/13/2022, 3:39 PM

## 2022-12-13 NOTE — Progress Notes (Signed)
The patient rated her day as an 18 out of a possible 10. She credited her good day to being able to drink water. She also said that she put God first today. The patient did not explain any further. Her goal for today was to visit with her husband which she achieved.

## 2022-12-13 NOTE — Progress Notes (Signed)
   12/13/22 1115  Psych Admission Type (Psych Patients Only)  Admission Status Voluntary  Psychosocial Assessment  Patient Complaints None  Eye Contact Fair  Facial Expression Animated  Affect Appropriate to circumstance  Speech Logical/coherent  Interaction Assertive  Motor Activity Other (Comment) (unremarkable)  Appearance/Hygiene Unremarkable  Behavior Characteristics Cooperative  Mood Pleasant  Thought Process  Coherency WDL  Content WDL  Delusions None reported or observed  Perception WDL  Hallucination None reported or observed  Judgment Poor  Confusion None  Danger to Self  Current suicidal ideation? Denies  Agreement Not to Harm Self Yes  Description of Agreement verbal  Danger to Others  Danger to Others None reported or observed

## 2022-12-14 DIAGNOSIS — F250 Schizoaffective disorder, bipolar type: Secondary | ICD-10-CM | POA: Diagnosis not present

## 2022-12-14 MED ORDER — TRAZODONE HCL 150 MG PO TABS
150.0000 mg | ORAL_TABLET | Freq: Every day | ORAL | Status: DC
  Administered 2022-12-14 – 2022-12-20 (×7): 150 mg via ORAL
  Filled 2022-12-14 (×12): qty 1

## 2022-12-14 MED ORDER — ARIPIPRAZOLE 15 MG PO TABS
15.0000 mg | ORAL_TABLET | Freq: Every day | ORAL | Status: DC
  Administered 2022-12-15 – 2022-12-16 (×2): 15 mg via ORAL
  Filled 2022-12-14 (×4): qty 1

## 2022-12-14 MED ORDER — LORAZEPAM 1 MG PO TABS
1.0000 mg | ORAL_TABLET | Freq: Two times a day (BID) | ORAL | Status: AC
  Administered 2022-12-14 – 2022-12-16 (×4): 1 mg via ORAL
  Filled 2022-12-14 (×4): qty 1

## 2022-12-14 MED ORDER — HYDROXYZINE HCL 50 MG PO TABS
50.0000 mg | ORAL_TABLET | Freq: Three times a day (TID) | ORAL | Status: DC | PRN
  Administered 2022-12-14 – 2022-12-19 (×5): 50 mg via ORAL
  Filled 2022-12-14 (×5): qty 1

## 2022-12-14 NOTE — Progress Notes (Signed)
[facility] MD Progress Note  12/14/2022 4:09 PM Barbara George  MRN:  [id number] Principal Problem: Schizoaffective disorder, bipolar type (HCC) Diagnosis: Principal Problem:   Schizoaffective disorder, bipolar type (HCC)  Reason for admission: Barbara George is a 27 y.o. [place]- American female with prior psychiatric history significant for schizoaffective disorder bipolar type who presents voluntarily to [facility] from [facility] after stabilization for worsening re-occurrence of auditory hallucinations.   24-hour chart review: Last vitals were yesterday, her assigned RN asked to recheck vitals.  Patient is compliant with scheduled medications.  Required agitation protocol medications earlier today morning at 4 AM due to agitation as per nursing documentation reports.  Sleep overnight was poor, with patient sleeping only 3 hours.  Disruptive to the milieu last night, and difficult to redirect.  Patient assessment notes: During encounter with patient, she is cooperative, but most likely because she had Ativan at 4 AM, and had her scheduled dose of Abilify earlier in the morning prior to this provider assessing her.  She did denies SI/HI/AVH.  She presents with delusional thinking; presents with thought insertion and thought withdrawal, states that she feels as though people can put thoughts in her head and take them out.  She states that random people are capable of putting in any type of thoughts, and taking out any type of thoughts.  She states that people can still have thoughts, but some thoughts are harsh alone.  She talks about people knowing what she is thinking about at all times.  She is however oriented to person, place, time, but is disoriented to situation.  Patient reports a good sleep quality last night, even though as per documentation she kept coming out of her room in the middle of the night asking for items, and had limited sleep.  Patient reports  a good appetite, denies medication related side effects.  She denies being in any physical pain today.  Reports last BM was last night. No TD/EPS type symptoms found on assessment, and pt denies any feelings of stiffness. AIMS: 0.   We are increasing Abilify to 15 mg starting tomorrow morning 7/13.  Increasing trazodone to 150 mg nightly for sleep, adding Ativan 1 mg twice daily x 2 days to help with anxiety.  Continuing other medications as listed below, and we will continue to monitor.  Assessment for discharge will be on a daily basis, as patient remains psychotic, and is continuously needing hospitalization for stabilization of her mental status.  Total Time spent with patient: 45 minutes  Past Psychiatric History: Previous Psych Diagnoses:  History of schizoaffective disorder bipolar type Prior inpatient treatment: Multiple inpatient psychiatric treatment at [facility] [place], [place] in [place], [place] behavioral health patient does not remember the dates of the year.  Patient chart review, patient was admitted in March 2018 at [facility], in April 2018 patient was admitted at [facility] behavioral health, in May 2020 patient was admitted at [facility] behavioral health, and in September 2021 patient was admitted and treated at [facility]. Current/prior outpatient treatment: Receiving medication management at [facility] behavioral health urgent care denies Prior rehab hx: Denies Psychotherapy hx: Yes History of suicide: Denies history of suicide due to religious and cultural reasons History of homicide or aggression: Denies Psychiatric medication history: Patient has been on trial Zyprexa and Abilify Psychiatric medication compliance history: Noncompliance Neuromodulation history: Denies Current Psychiatrist: Denies Current therapist: Denies  Past Medical History:  Past Medical  History:  Diagnosis Date   Bipolar 1 disorder (HCC)     Depression    Diabetes mellitus without complication (HCC)    Type 2 (Pre)   Schizophrenia (HCC)    History reviewed. No pertinent surgical history. Family History:  Family History  Problem Relation Age of Onset   Hypertension Father    Family Psychiatric  History: See H&P  Social History:  Social History   Substance and Sexual Activity  Alcohol Use No   Alcohol/week: 0.0 standard drinks of alcohol     Social History   Substance and Sexual Activity  Drug Use No    Social History   Socioeconomic History   Marital status: Married    Spouse name: Amadou   Number of children: Not on file   Years of education: Not on file   Highest education level: Associate degree: occupational, Scientist, product/process development, or vocational program  Occupational History   Occupation: unemployed  Tobacco Use   Smoking status: Never   Smokeless tobacco: Never  Vaping Use   Vaping status: Never Used  Substance and Sexual Activity   Alcohol use: No    Alcohol/week: 0.0 standard drinks of alcohol   Drug use: No   Sexual activity: Yes    Birth control/protection: None  Other Topics Concern   Not on file  Social History Narrative   ** Merged History Encounter **       Social Determinants of Health   Financial Resource Strain: Not on file  Food Insecurity: No Food Insecurity (12/11/2022)   Hunger Vital Sign    Worried About Running Out of Food in the Last Year: Never true    Ran Out of Food in the Last Year: Never true  Transportation Needs: No Transportation Needs (12/11/2022)   PRAPARE - Administrator, Civil Service (Medical): No    Lack of Transportation (Non-Medical): No  Physical Activity: Inactive (04/03/2019)   Exercise Vital Sign    Days of Exercise per Week: 0 days    Minutes of Exercise per Session: 0 min  Stress: Not on file  Social Connections: Socially Integrated (04/03/2019)   Social Connection and Isolation Panel [NHANES]    Frequency of Communication with Friends and  Family: More than three times a week    Frequency of Social Gatherings with Friends and Family: Once a week    Attends Religious Services: More than 4 times per year    Active Member of [employer] or Organizations: Yes    Attends Engineer, structural: Not on file    Marital Status: Married   Additional Social History:    Sleep: Good  Appetite:  Good  Current Medications: Current Facility-Administered Medications  Medication Dose Route Frequency Provider Last Rate Last Admin   acetaminophen (TYLENOL) tablet 650 mg  650 mg Oral Q6H PRN Peterson Ao, MD   650 mg at 12/13/22 1126   alum & mag hydroxide-simeth (MAALOX/MYLANTA) 200-200-20 MG/5ML suspension 30 mL  30 mL Oral Q4H PRN Peterson Ao, MD       [START ON 12/15/2022] ARIPiprazole (ABILIFY) tablet 15 mg  15 mg Oral Daily Adiba Fargnoli, NP       diphenhydrAMINE (BENADRYL) capsule 50 mg  50 mg Oral TID PRN Peterson Ao, MD   50 mg at 12/14/22 0404   Or   diphenhydrAMINE (BENADRYL) injection 50 mg  50 mg Intramuscular TID PRN Peterson Ao, MD       haloperidol (HALDOL) tablet 5 mg  5 mg Oral  TID PRN Peterson Ao, MD   5 mg at 12/14/22 0404   Or   haloperidol lactate (HALDOL) injection 5 mg  5 mg Intramuscular TID PRN Peterson Ao, MD       hydrOXYzine (ATARAX) tablet 50 mg  50 mg Oral TID PRN Starleen Blue, NP       LORazepam (ATIVAN) tablet 2 mg  2 mg Oral TID PRN Peterson Ao, MD   2 mg at 12/14/22 0404   Or   LORazepam (ATIVAN) injection 2 mg  2 mg Intramuscular TID PRN Peterson Ao, MD       LORazepam (ATIVAN) tablet 1 mg  1 mg Oral BID Starleen Blue, NP       magnesium hydroxide (MILK OF MAGNESIA) suspension 30 mL  30 mL Oral Daily PRN Peterson Ao, MD       simvastatin (ZOCOR) tablet 10 mg  10 mg Oral q1800 Ntuen, Tina C, FNP   10 mg at 12/13/22 1801   traZODone (DESYREL) tablet 150 mg  150 mg Oral QHS Starleen Blue, NP       Lab Results: No results found for this or any previous visit (from  the past 48 hour(s)).  Blood Alcohol level:  Lab Results  Component Value Date   ETH <10 12/11/2022   ETH <10 02/20/2020   Metabolic Disorder Labs: Lab Results  Component Value Date   HGBA1C 6.2 (H) 12/11/2022   MPG 131 12/11/2022   MPG 111.15 10/23/2018   Lab Results  Component Value Date   PROLACTIN 13.0 12/11/2022   PROLACTIN CANCELED 09/17/2022   Lab Results  Component Value Date   CHOL 228 (H) 12/11/2022   TRIG 122 12/11/2022   HDL 91 12/11/2022   CHOLHDL 2.5 12/11/2022   VLDL 24 12/11/2022   LDLCALC 113 (H) 12/11/2022   LDLCALC 95 10/23/2018   Physical Findings: AIMS:  , ,  ,  ,    CIWA:    COWS:     Musculoskeletal: Strength & Muscle Tone: within normal limits Gait & Station: normal Patient leans: N/A  Psychiatric Specialty Exam:  Presentation  General Appearance:  Appropriate for Environment; Fairly Groomed  Eye Contact: Good  Speech: Clear and Coherent  Speech Volume: Normal  Handedness: Right  Mood and Affect  Mood: Depressed; Anxious  Affect: Congruent  Thought Process  Thought Processes: Coherent  Descriptions of Associations:Intact  Orientation:Full (Time, Place and Person)  Thought Content:Illogical  History of Schizophrenia/Schizoaffective disorder:Yes  Duration of Psychotic Symptoms:Greater than six months  Hallucinations:Hallucinations: None Description of Auditory Hallucinations: Denies  Ideas of Reference:Delusions  Suicidal Thoughts:Suicidal Thoughts: No  Homicidal Thoughts:Homicidal Thoughts: No  Sensorium  Memory: Immediate Good  Judgment: Fair  Insight: Fair  Art therapist  Concentration: Fair  Attention Span: Fair  Recall: Fair  Fund of Knowledge: Poor  Language: Fair  Psychomotor Activity  Psychomotor Activity: Psychomotor Activity: Normal  Assets  Assets: Communication Skills; Resilience; Social Support  Sleep  Sleep: Sleep: Poor Number of Hours of Sleep:  5  Physical Exam: Physical Exam Vitals and nursing note reviewed.  Constitutional:      Appearance: She is obese.  HENT:     Head: Normocephalic.     Nose: Nose normal.     Mouth/Throat:     Mouth: Mucous membranes are moist.  Eyes:     Extraocular Movements: Extraocular movements intact.     Pupils: Pupils are equal, round, and reactive to light.  Cardiovascular:     Rate and Rhythm: Tachycardia  present.  Pulmonary:     Effort: Pulmonary effort is normal.  Abdominal:     Comments: Deferred  Genitourinary:    Comments: Deferred Musculoskeletal:        General: Normal range of motion.     Cervical back: Normal range of motion.  Skin:    General: Skin is warm.  Neurological:     General: No focal deficit present.     Mental Status: She is alert and oriented to person, place, and time.  Psychiatric:        Behavior: Behavior normal.     Comments: Smiling unnecessarily    Review of Systems  Constitutional:  Negative for chills and fever.  HENT:  Negative for sore throat.   Eyes: Negative.   Respiratory:  Negative for cough, shortness of breath and wheezing.   Cardiovascular:  Negative for chest pain and palpitations.       Blood pressure 126/98, pulse 104.  Patient is asymptomatic, nursing staff to recheck vital signs.  Gastrointestinal:  Negative for abdominal pain, heartburn, nausea and vomiting.  Genitourinary: Negative.   Musculoskeletal: Negative.   Skin:  Negative for itching and rash.  Neurological:  Negative for dizziness, tingling, tremors and headaches.  Endo/Heme/Allergies:        See allergy listing  Psychiatric/Behavioral:  Positive for depression and hallucinations. The patient is nervous/anxious and has insomnia.    Blood pressure 137/76, pulse 88, temperature 98.8 F (37.1 C), temperature source Oral, resp. rate 18, height 5\' 8"  (1.727 m), weight 108.6 kg, SpO2 100%. Body mass index is 36.4 kg/m.  Treatment Plan Summary: Daily contact with patient  to assess and evaluate symptoms and progress in treatment and Medication management  Primary Diagnosis: Schizoaffective disorder, bipolar type (HCC)   Plan: Medications: -Start Ativan 1 mg BID x 2 days for anxiety, then stop -Increase Abilify to 15 mg p.o. daily for psychosis and mood stabilization starting 7/13 -Increase trazodone to 150 mg p.o. nightly for insomnia starting tonight (7/12) -Increase hydroxyzine to 50 mg tablet p.o. 3 times daily as needed anxiety -Continue simvastatin 10 mg p.o. at 1800 daily for hyperlipidemia   Agitation protocol: Benadryl capsule 50 mg p.o. or IM 3 times daily as needed agitation   Haldol tablets 5 mg po IM 3 times daily as needed agitation   Lorazepam tablet 2 mg p.o. or IM 3 times daily as needed agitation     Other PRN Medications -Acetaminophen 650 mg every 6 as needed/mild pain -Maalox 30 mL oral every 4 as needed/digestion -Magnesium hydroxide 30 mL daily as needed/mild constipation   -- The risks/benefits/side-effects/alternatives to this medication were discussed in detail with the patient and time was given for questions. The patient consents to medication trial.              -- Encouraged patient to participate  in unit milieu and in scheduled group therapies    Safety and Monitoring: Voluntary admission to inpatient psychiatric unit for safety, stabilization and treatment Daily contact with patient to assess and evaluate symptoms and progress in treatment Patient's case to be discussed in multi-disciplinary team meeting Observation Level : q15 minute checks Vital signs: q12 hours Precautions: suicide, but pt currently verbally contracts for safety on unit    Discharge Planning: Social work and case management to assist with discharge planning and identification of hospital follow-up needs prior to discharge Estimated LOS: 5-7 days Discharge Concerns: Need to establish a safety plan; Medication compliance and  effectiveness Discharge Goals:  Return home with outpatient referrals for mental health follow-up including medication management/psychotherapy.   Long Term Goal(s): Improvement in symptoms so as ready for discharge   Short Term Goals: Ability to identify changes in lifestyle to reduce recurrence of condition will improve, Ability to verbalize feelings will improve, Ability to disclose and discuss suicidal ideas, Ability to demonstrate self-control will improve, Ability to identify and develop effective coping behaviors will improve, Ability to maintain clinical measurements within normal limits will improve, Compliance with prescribed medications will improve, and Ability to identify triggers associated with substance abuse/mental health issues will improve   I certify that inpatient services furnished can reasonably be expected to improve the patient's condition.    Starleen Blue, NP 12/14/2022, 4:09 PM Patient ID: Barbara George, female   DOB: [birth year], 27 y.o.   MRN: [id number]

## 2022-12-14 NOTE — Progress Notes (Signed)
Pt up taking multiple showers, singing loudly , pt requiring frequent re-direction

## 2022-12-14 NOTE — Progress Notes (Signed)
Chaplain met with Barbara George for emotional and spiritual support.  She was in good spirits and is trying to stay positive.  Her faith is very important to her and gives her strength.  She is hopeful that with medication, she may not have another relapse and that she can find some meaningful work to do.  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 She has goals for herself and hopes that she and her husband may be able to have a baby.  Chaplain provided listening and compassionate presence.   [address] [facility] Pager, [phone]

## 2022-12-14 NOTE — Group Note (Signed)
Recreation Therapy Group Note   Group Topic:Problem Solving  Group Date: 12/14/2022 Start Time: 1020 End Time: 1100 Facilitators: Cortavious Nix-McCall, LRT,CTRS Location: [address] Dayroom   Goal Area(s) Addresses:  Patient will effectively work with peer towards shared goal.  Patient will identify skills used to make activity successful.  Patient will identify how skills used during activity can be used to reach post d/c goals.    Group Description: Landing Pad. In teams of 3-5, patients were given 12 plastic drinking straws and an equal length of masking tape. Using the materials provided, patients were asked to build a landing pad to catch a golf ball dropped from approximately 5 feet in the air. All materials were required to be used by the team in their design. LRT facilitated post-activity discussion.   Affect/Mood: Appropriate   Participation Level: Engaged   Participation Quality: Independent   Behavior: Appropriate   Speech/Thought Process: Focused   Insight: Good   Judgement: Good   Modes of Intervention: STEM Activity   Patient Response to Interventions:  Engaged   Education Outcome:  Acknowledges education   Clinical Observations/Individualized Feedback: Pt was bright and engaged. Pt was focused and worked with peers in creating and completing the activity. Pt was appropriate and shared ideas with peers in being successful in the activity.     Plan: Continue to engage patient in RT group sessions 2-3x/week.   Jourdain Guay-McCall, LRT,CTRS 12/14/2022 12:29 PM

## 2022-12-14 NOTE — Progress Notes (Signed)
Adult Psychoeducational Group Note  Date:  12/14/2022 Time:  9:31 PM  Group Topic/Focus:  Wrap-Up Group:   The focus of this group is to help patients review their daily goal of treatment and discuss progress on daily workbooks.  Participation Level:  Active  Participation Quality:  Appropriate  Affect:  Appropriate  Cognitive:  Appropriate  Insight: Appropriate  Engagement in Group:  Engaged  Modes of Intervention:  Discussion  Additional Comments:  Pt stated her goal for the day was to get some sleep.  Pt met goal.  Lucilla Lame 12/14/2022, 9:31 PM

## 2022-12-14 NOTE — Progress Notes (Signed)
Pt coming out her room constantly asking for unnecessary things . Pt wanting to talk to staff, writer informed pt that staff would be available to talk at 6 am. Pt gets upset and loud when re-directed, pt informed to talk to the doctor in the morning.

## 2022-12-14 NOTE — B[facility] Group Notes (Signed)
Adult Psychoeducational Group Note  Date:  12/14/2022 Time:  4:24 PM  Group Topic/Focus:  Goals Group:   The focus of this group is to help patients establish daily goals to achieve during treatment and discuss how the patient can incorporate goal setting into their daily lives to aide in recovery. Orientation:   The focus of this group is to educate the patient on the purpose and policies of crisis stabilization and provide a format to answer questions about their admission.  The group details unit policies and expectations of patients while admitted.  Participation Level:  Did Not Attend  Participation Quality:    Affect:    Cognitive:    Insight:   Engagement in Group:    Modes of Intervention:    Additional Comments:    Sheran Lawless 12/14/2022, 4:24 PM

## 2022-12-14 NOTE — Plan of Care (Signed)
  Problem: Activity: Goal: Interest or engagement in activities will improve Outcome: Progressing   

## 2022-12-14 NOTE — B[facility] Group Notes (Addendum)
Spirituality group facilitated by Kathleen Argue, BCC.  Group Description: Group focused on topic of hope. Patients participated in facilitated discussion around topic, connecting with one another around experiences and definitions for hope. Group members engaged with visual explorer photos, reflecting on what hope looks like for them today. Group engaged in discussion around how their definitions of hope are present today in hospital.  Modalities: Psycho-social ed, Adlerian, Narrative, MI  Patient Progress: Barbara George attended group and actively engaged and participated in group conversation and activities.  Her comments were on topic and appropriate for group context and demonstrated good insight into the topic.

## 2022-12-14 NOTE — Progress Notes (Signed)
   12/14/22 2045  Psych Admission Type (Psych Patients Only)  Admission Status Voluntary  Psychosocial Assessment  Patient Complaints None  Eye Contact Fair  Facial Expression Flat  Affect Anxious  Speech Logical/coherent  Interaction Assertive  Motor Activity Other (Comment) (WNL)  Appearance/Hygiene Unremarkable  Behavior Characteristics Cooperative;Calm  Mood Anxious;Pleasant  Thought Process  Coherency WDL  Content Preoccupation  Delusions None reported or observed  Perception WDL  Hallucination None reported or observed  Judgment Impaired  Confusion None  Danger to Self  Current suicidal ideation? Denies  Agreement Not to Harm Self Yes  Description of Agreement verbal  Danger to Others  Danger to Others None reported or observed   Pt was offered support and encouragement. Pt was given scheduled medications. PRN Hydroxyzine was given per [facility]. Q 15 minute checks were done for safety. Pt attended group and interacts well with peers and staff. Pt has no complaints.Pt receptive to treatment and safety maintained on unit.

## 2022-12-14 NOTE — Progress Notes (Signed)
The pt.came out of her room and verbalized that she wanted milk to drink and proceeded to point with her finger. Earlier in the evening, she became upset after getting loud and pointing with her finger rather than verbalizing her needs.

## 2022-12-14 NOTE — Progress Notes (Signed)
   12/14/22 1030  Psych Admission Type (Psych Patients Only)  Admission Status Voluntary  Psychosocial Assessment  Patient Complaints None  Eye Contact Brief  Facial Expression Flat  Affect Flat  Speech Logical/coherent  Interaction Minimal  Motor Activity Slow  Appearance/Hygiene Unremarkable  Behavior Characteristics Cooperative  Mood Preoccupied  Thought Process  Coherency WDL  Content Preoccupation  Delusions None reported or observed  Perception WDL  Hallucination None reported or observed  Judgment Impaired  Confusion None  Danger to Self  Current suicidal ideation? Denies  Agreement Not to Harm Self Yes  Description of Agreement verbal  Danger to Others  Danger to Others None reported or observed

## 2022-12-15 DIAGNOSIS — F250 Schizoaffective disorder, bipolar type: Secondary | ICD-10-CM | POA: Diagnosis not present

## 2022-12-15 NOTE — Plan of Care (Signed)
  Problem: Education: Goal: Emotional status will improve Outcome: Not Progressing Goal: Mental status will improve Outcome: Not Progressing   Problem: Coping: Goal: Ability to demonstrate self-control will improve Outcome: Not Progressing   Problem: Health Behavior/Discharge Planning: Goal: Compliance with prescribed medication regimen will improve Outcome: Progressing   Problem: Role Relationship: Goal: Ability to interact with others will improve Outcome: Not Progressing

## 2022-12-15 NOTE — Group Note (Signed)
Date:  12/15/2022 Time:  9:13 PM  Group Topic/Focus:  Wrap-Up Group:   The focus of this group is to help patients review their daily goal of treatment and discuss progress on daily workbooks.  The focus of this wrap-up group was to help patients get consistent with setting daily goals and discuss the patients familiarity with coping skills that are most helpful in their recovery.   Participation Level:  Active  Participation Quality:  Sharing  Affect:  Appropriate  Cognitive:  Disorganized  Insight: Lacking  Engagement in Group:  Engaged  Modes of Intervention:  Discussion  Additional Comments:  The patient was engaged in the group discussion. She appeared very disoriented in her thoughts. It was difficult to follow her conversation.   Berna Spare H Noeh Sparacino 12/15/2022, 9:13 PM

## 2022-12-15 NOTE — Progress Notes (Signed)
   12/15/22 1132  Psych Admission Type (Psych Patients Only)  Admission Status Voluntary  Psychosocial Assessment  Patient Complaints None  Eye Contact Fair  Facial Expression Flat  Affect Anxious  Speech Logical/coherent  Interaction Assertive  Motor Activity Other (Comment) (WNL)  Appearance/Hygiene Unremarkable  Behavior Characteristics Cooperative;Calm  Mood Anxious;Pleasant  Thought Process  Coherency WDL  Content Preoccupation  Delusions None reported or observed  Perception WDL  Hallucination None reported or observed  Judgment Impaired  Confusion None  Danger to Self  Current suicidal ideation? Denies  Agreement Not to Harm Self Yes  Description of Agreement verbal  Danger to Others  Danger to Others None reported or observed

## 2022-12-15 NOTE — Progress Notes (Signed)
[facility] MD Progress Note  12/15/2022 2:35 PM Barbara George  MRN:  [id number] Principal Problem: Schizoaffective disorder, bipolar type (HCC) Diagnosis: Principal Problem:   Schizoaffective disorder, bipolar type (HCC)  Reason for admission: Barbara George is a 27 y.o. [place]- American female with prior psychiatric history significant for schizoaffective disorder bipolar type who presents voluntarily to [facility] from [facility] after stabilization for worsening re-occurrence of auditory hallucinations.   24-hour chart review: Vital signs with heart rate 122, patient's assigned RN notified to push fluids and also recheck.  Patient remains compliant with medications, slept for 11 hours per nursing reports and documentation. Pt attending unit group sessions.   Patient assessment notes:  During encounter with patient, she denies SI/HI/AVH, denies paranoia. She presents with some thought insertion/thought withdrawal; still thinks that people can put thoughts in her head and take them out, but states she is beginning to doubt that it. This shows an improvement in her psychosis.   Patient reports a good sleep quality last night with the addition of the Ativan 1 mg twice daily and an increase in her trazodone from 100 to 150 mg nightly.  She reports a good appetite, denies medication related side effects.  She denies being in any physical pain.   Writer called her spouse after obtaining patient's verbal consent: [phone] (Amadou Adamou).  Called in an effort to determine if spouse things that patient is back to her baseline of functioning since he has been visiting her.  Call went to voicemail.  We will attempt to call her spouse again tomorrow.  Patient reports that she is not always able to get an appointment to see her outpatient mental health provider, which has negatively impacted upon her mental health.  It is imperative that patient remains hospitalized  until Monday, when an appointment can be made for patient with a different provider as she is asking for this change. This is to foster compliance with continuity of care. She is also on the Ativan x 2 days for anxiety and restlessness which will not be completed until tomorrow. We will need to monitor patient's symptoms off the Ativan for at least 24 hours prior to discharge.  Tentative discharge date is Monday 7/15 or Tuesday 7/16 pending safety planning being completed with husband, and also given that symptoms continue to resolve.  We will continue medications as listed below, with no changes at this time.  Total Time spent with patient: 45 minutes  Past Psychiatric History: Previous Psych Diagnoses:  History of schizoaffective disorder bipolar type Prior inpatient treatment: Multiple inpatient psychiatric treatment at [facility] [place], [place] in [place], [place] behavioral health patient does not remember the dates of the year.  Patient chart review, patient was admitted in March 2018 at [facility], in April 2018 patient was admitted at [facility] behavioral [facility], in May 2020 patient was admitted at [facility] behavioral health, and in September 2021 patient was admitted and treated at [facility]. Current/prior outpatient treatment: Receiving medication management at [facility] behavioral health urgent care denies Prior rehab hx: Denies Psychotherapy hx: Yes History of suicide: Denies history of suicide due to religious and cultural reasons History of homicide or aggression: Denies Psychiatric medication history: Patient has been on trial Zyprexa and Abilify Psychiatric medication compliance history: Noncompliance Neuromodulation history: Denies Current Psychiatrist: Denies Current therapist: Denies  Past Medical History:  Past Medical History:  Diagnosis Date   Bipolar 1 disorder (HCC)  Depression    Diabetes mellitus without  complication (HCC)    Type 2 (Pre)   Schizophrenia (HCC)    History reviewed. No pertinent surgical history. Family History:  Family History  Problem Relation Age of Onset   Hypertension Father    Family Psychiatric  History: See H&P  Social History:  Social History   Substance and Sexual Activity  Alcohol Use No   Alcohol/week: 0.0 standard drinks of alcohol     Social History   Substance and Sexual Activity  Drug Use No    Social History   Socioeconomic History   Marital status: Married    Spouse name: Amadou   Number of children: Not on file   Years of education: Not on file   Highest education level: Associate degree: occupational, Scientist, product/process development, or vocational program  Occupational History   Occupation: unemployed  Tobacco Use   Smoking status: Never   Smokeless tobacco: Never  Vaping Use   Vaping status: Never Used  Substance and Sexual Activity   Alcohol use: No    Alcohol/week: 0.0 standard drinks of alcohol   Drug use: No   Sexual activity: Yes    Birth control/protection: None  Other Topics Concern   Not on file  Social History Narrative   ** Merged History Encounter **       Social Determinants of Health   Financial Resource Strain: Not on file  Food Insecurity: No Food Insecurity (12/11/2022)   Hunger Vital Sign    Worried About Running Out of Food in the Last Year: Never true    Ran Out of Food in the Last Year: Never true  Transportation Needs: No Transportation Needs (12/11/2022)   PRAPARE - Administrator, Civil Service (Medical): No    Lack of Transportation (Non-Medical): No  Physical Activity: Inactive (04/03/2019)   Exercise Vital Sign    Days of Exercise per Week: 0 days    Minutes of Exercise per Session: 0 min  Stress: Not on file  Social Connections: Socially Integrated (04/03/2019)   Social Connection and Isolation Panel [NHANES]    Frequency of Communication with Friends and Family: More than three times a week     Frequency of Social Gatherings with Friends and Family: Once a week    Attends Religious Services: More than 4 times per year    Active Member of [employer] or Organizations: Yes    Attends Engineer, structural: Not on file    Marital Status: Married   Additional Social History:    Sleep: Good  Appetite:  Good  Current Medications: Current Facility-Administered Medications  Medication Dose Route Frequency Provider Last Rate Last Admin   acetaminophen (TYLENOL) tablet 650 mg  650 mg Oral Q6H PRN Peterson Ao, MD   650 mg at 12/13/22 1126   alum & mag hydroxide-simeth (MAALOX/MYLANTA) 200-200-20 MG/5ML suspension 30 mL  30 mL Oral Q4H PRN Peterson Ao, MD       ARIPiprazole (ABILIFY) tablet 15 mg  15 mg Oral Daily Caryl Manas, NP   15 mg at 12/15/22 0820   diphenhydrAMINE (BENADRYL) capsule 50 mg  50 mg Oral TID PRN Peterson Ao, MD   50 mg at 12/14/22 0404   Or   diphenhydrAMINE (BENADRYL) injection 50 mg  50 mg Intramuscular TID PRN Peterson Ao, MD       haloperidol (HALDOL) tablet 5 mg  5 mg Oral TID PRN Peterson Ao, MD   5 mg at 12/14/22 0404  Or   haloperidol lactate (HALDOL) injection 5 mg  5 mg Intramuscular TID PRN Peterson Ao, MD       hydrOXYzine (ATARAX) tablet 50 mg  50 mg Oral TID PRN Starleen Blue, NP   50 mg at 12/14/22 2046   LORazepam (ATIVAN) tablet 2 mg  2 mg Oral TID PRN Peterson Ao, MD   2 mg at 12/14/22 0404   Or   LORazepam (ATIVAN) injection 2 mg  2 mg Intramuscular TID PRN Peterson Ao, MD       LORazepam (ATIVAN) tablet 1 mg  1 mg Oral BID Starleen Blue, NP   1 mg at 12/15/22 1610   magnesium hydroxide (MILK OF MAGNESIA) suspension 30 mL  30 mL Oral Daily PRN Peterson Ao, MD       simvastatin (ZOCOR) tablet 10 mg  10 mg Oral q1800 Ntuen, Jesusita Oka, FNP   10 mg at 12/14/22 1722   traZODone (DESYREL) tablet 150 mg  150 mg Oral QHS Starleen Blue, NP   150 mg at 12/14/22 2045   Lab Results: No results found for this or any  previous visit (from the past 48 hour(s)).  Blood Alcohol level:  Lab Results  Component Value Date   ETH <10 12/11/2022   ETH <10 02/20/2020   Metabolic Disorder Labs: Lab Results  Component Value Date   HGBA1C 6.2 (H) 12/11/2022   MPG 131 12/11/2022   MPG 111.15 10/23/2018   Lab Results  Component Value Date   PROLACTIN 13.0 12/11/2022   PROLACTIN CANCELED 09/17/2022   Lab Results  Component Value Date   CHOL 228 (H) 12/11/2022   TRIG 122 12/11/2022   HDL 91 12/11/2022   CHOLHDL 2.5 12/11/2022   VLDL 24 12/11/2022   LDLCALC 113 (H) 12/11/2022   LDLCALC 95 10/23/2018   Physical Findings: AIMS:  , ,  ,  ,    CIWA:    COWS:     Musculoskeletal: Strength & Muscle Tone: within normal limits Gait & Station: normal Patient leans: N/A  Psychiatric Specialty Exam:  Presentation  General Appearance:  Appropriate for Environment; Fairly Groomed  Eye Contact: Good  Speech: Clear and Coherent  Speech Volume: Decreased  Handedness: Right  Mood and Affect  Mood: Depressed  Affect: Congruent  Thought Process  Thought Processes: Coherent  Descriptions of Associations:Intact  Orientation:Full (Time, Place and Person)  Thought Content:Logical  History of Schizophrenia/Schizoaffective disorder:Yes  Duration of Psychotic Symptoms:Greater than six months  Hallucinations:Hallucinations: None  Ideas of Reference:Delusions  Suicidal Thoughts:Suicidal Thoughts: No  Homicidal Thoughts:Homicidal Thoughts: No  Sensorium  Memory: Immediate Good  Judgment: Fair  Insight: Fair  Art therapist  Concentration: Fair  Attention Span: Fair  Recall: Fair  Fund of Knowledge: Fair  Language: Fair  Psychomotor Activity  Psychomotor Activity: Psychomotor Activity: Normal  Assets  Assets: Resilience; Communication Skills  Sleep  Sleep: Sleep: Good  Physical Exam: Physical Exam Vitals and nursing note reviewed.   Constitutional:      Appearance: She is obese.  HENT:     Head: Normocephalic.     Nose: Nose normal.     Mouth/Throat:     Mouth: Mucous membranes are moist.  Eyes:     Extraocular Movements: Extraocular movements intact.     Pupils: Pupils are equal, round, and reactive to light.  Cardiovascular:     Rate and Rhythm: Tachycardia present.  Pulmonary:     Effort: Pulmonary effort is normal.  Abdominal:     Comments:  Deferred  Genitourinary:    Comments: Deferred Musculoskeletal:        General: Normal range of motion.     Cervical back: Normal range of motion.  Skin:    General: Skin is warm.  Neurological:     General: No focal deficit present.     Mental Status: She is alert and oriented to person, place, and time.  Psychiatric:        Behavior: Behavior normal.     Comments: Smiling unnecessarily    Review of Systems  Constitutional:  Negative for chills and fever.  HENT:  Negative for sore throat.   Eyes: Negative.   Respiratory:  Negative for cough, shortness of breath and wheezing.   Cardiovascular:  Negative for chest pain and palpitations.       Blood pressure 126/98, pulse 104.  Patient is asymptomatic, nursing staff to recheck vital signs.  Gastrointestinal:  Negative for abdominal pain, heartburn, nausea and vomiting.  Genitourinary: Negative.   Musculoskeletal: Negative.  Negative for falls and myalgias.  Skin:  Negative for itching and rash.  Neurological:  Negative for dizziness, tingling, tremors and headaches.  Endo/Heme/Allergies:        See allergy listing  Psychiatric/Behavioral:  Positive for depression and hallucinations. Negative for memory loss, substance abuse and suicidal ideas. The patient is nervous/anxious and has insomnia.    Blood pressure 121/65, pulse 96, temperature 98.1 F (36.7 C), temperature source Oral, resp. rate 18, height 5\' 8"  (1.727 m), weight 108.6 kg, SpO2 100%. Body mass index is 36.4 kg/m.  Treatment Plan  Summary: Daily contact with patient to assess and evaluate symptoms and progress in treatment and Medication management  Primary Diagnosis: Schizoaffective disorder, bipolar type (HCC)   Plan: Medications: -Continue Ativan 1 mg BID x 2 days for anxiety, then stop -Continue Abilify to 15 mg p.o. daily for psychosis and mood stabilization starting 7/13 -Continue trazodone 150 mg p.o. nightly for insomnia  -Continue hydroxyzine 50 mg tablet p.o. 3 times daily as needed anxiety -Continue simvastatin 10 mg p.o. at 1800 daily for hyperlipidemia   Agitation protocol: Benadryl capsule 50 mg p.o. or IM 3 times daily as needed agitation   Haldol tablets 5 mg po IM 3 times daily as needed agitation   Lorazepam tablet 2 mg p.o. or IM 3 times daily as needed agitation     Other PRN Medications -Acetaminophen 650 mg every 6 as needed/mild pain -Maalox 30 mL oral every 4 as needed/digestion -Magnesium hydroxide 30 mL daily as needed/mild constipation   -- The risks/benefits/side-effects/alternatives to this medication were discussed in detail with the patient and time was given for questions. The patient consents to medication trial.              -- Encouraged patient to participate  in unit milieu and in scheduled group therapies    Safety and Monitoring: Voluntary admission to inpatient psychiatric unit for safety, stabilization and treatment Daily contact with patient to assess and evaluate symptoms and progress in treatment Patient's case to be discussed in multi-disciplinary team meeting Observation Level : q15 minute checks Vital signs: q12 hours Precautions: suicide, but pt currently verbally contracts for safety on unit    Discharge Planning: Social work and case management to assist with discharge planning and identification of hospital follow-up needs prior to discharge Estimated LOS: 5-7 days Discharge Concerns: Need to establish a safety plan; Medication compliance and  effectiveness Discharge Goals: Return home with outpatient referrals for mental health follow-up  including medication management/psychotherapy.   Long Term Goal(s): Improvement in symptoms so as ready for discharge   Short Term Goals: Ability to identify changes in lifestyle to reduce recurrence of condition will improve, Ability to verbalize feelings will improve, Ability to disclose and discuss suicidal ideas, Ability to demonstrate self-control will improve, Ability to identify and develop effective coping behaviors will improve, Ability to maintain clinical measurements within normal limits will improve, Compliance with prescribed medications will improve, and Ability to identify triggers associated with substance abuse/mental health issues will improve   I certify that inpatient services furnished can reasonably be expected to improve the patient's condition.    Starleen Blue, NP 12/15/2022, 2:35 PM Patient ID: Barbara George, female   DOB: 08/11/1995, 27 y.o.

## 2022-12-15 NOTE — Progress Notes (Signed)
   12/15/22 2100  Psych Admission Type (Psych Patients Only)  Admission Status Voluntary  Psychosocial Assessment  Patient Complaints None  Eye Contact Fair  Facial Expression Flat  Affect Anxious  Speech Logical/coherent  Interaction Assertive  Motor Activity Other (Comment) (WDL)  Appearance/Hygiene Unremarkable  Behavior Characteristics Cooperative;Calm  Mood Anxious;Pleasant  Thought Process  Coherency WDL  Content Preoccupation  Delusions None reported or observed  Perception WDL  Hallucination None reported or observed  Judgment Impaired  Confusion None  Danger to Self  Current suicidal ideation? Denies  Agreement Not to Harm Self Yes  Description of Agreement verbal  Danger to Others  Danger to Others None reported or observed

## 2022-12-15 NOTE — Progress Notes (Signed)
   12/15/22 0601  15 Minute Checks  Location Bedroom  Visual Appearance Calm  Behavior Sleeping  Sleep (Behavioral Health Patients Only)  Calculate sleep? (Click Yes once per 24 hr at 0600 safety check) Yes  Documented sleep last 24 hours 11

## 2022-12-16 DIAGNOSIS — F250 Schizoaffective disorder, bipolar type: Secondary | ICD-10-CM | POA: Diagnosis not present

## 2022-12-16 MED ORDER — ARIPIPRAZOLE 10 MG PO TABS
20.0000 mg | ORAL_TABLET | Freq: Every day | ORAL | Status: DC
  Administered 2022-12-17 – 2022-12-21 (×5): 20 mg via ORAL
  Filled 2022-12-16 (×7): qty 2

## 2022-12-16 NOTE — Plan of Care (Signed)
  Problem: Education: Goal: Knowledge of [facility] Education information/materials will improve Outcome: Progressing Goal: Emotional status will improve Outcome: Progressing Goal: Mental status will improve Outcome: Progressing Goal: Verbalization of understanding the information provided will improve Outcome: Progressing   Problem: Activity: Goal: Interest or engagement in activities will improve Outcome: Progressing Goal: Sleeping patterns will improve Outcome: Progressing   Problem: Coping: Goal: Ability to verbalize frustrations and anger appropriately will improve Outcome: Progressing Goal: Ability to demonstrate self-control will improve Outcome: Progressing   Problem: Health Behavior/Discharge Planning: Goal: Identification of resources available to assist in meeting health care needs will improve Outcome: Progressing Goal: Compliance with treatment plan for underlying cause of condition will improve Outcome: Progressing   Problem: Physical Regulation: Goal: Ability to maintain clinical measurements within normal limits will improve Outcome: Progressing   Problem: Safety: Goal: Periods of time without injury will increase Outcome: Progressing   Problem: Activity: Goal: Will verbalize the importance of balancing activity with adequate rest periods Outcome: Progressing   Problem: Education: Goal: Will be free of psychotic symptoms Outcome: Progressing Goal: Knowledge of the prescribed therapeutic regimen will improve Outcome: Progressing   Problem: Coping: Goal: Coping ability will improve Outcome: Progressing Goal: Will verbalize feelings Outcome: Progressing   Problem: Health Behavior/Discharge Planning: Goal: Compliance with prescribed medication regimen will improve Outcome: Progressing   Problem: Nutritional: Goal: Ability to achieve adequate nutritional intake will improve Outcome: Progressing   Problem: Role Relationship: Goal:  Ability to communicate needs accurately will improve Outcome: Progressing Goal: Ability to interact with others will improve Outcome: Progressing   Problem: Safety: Goal: Ability to redirect hostility and anger into socially appropriate behaviors will improve Outcome: Progressing Goal: Ability to remain free from injury will improve Outcome: Progressing   Problem: Self-Care: Goal: Ability to participate in self-care as condition permits will improve Outcome: Progressing   Problem: Self-Concept: Goal: Will verbalize positive feelings about self Outcome: Progressing   

## 2022-12-16 NOTE — Progress Notes (Signed)
   12/16/22 2300  Psych Admission Type (Psych Patients Only)  Admission Status Voluntary  Psychosocial Assessment  Patient Complaints Other (Comment) (upset stomach)  Eye Contact Fair  Facial Expression Flat  Affect Anxious  Speech Logical/coherent  Interaction Assertive  Motor Activity Slow  Appearance/Hygiene Unremarkable  Behavior Characteristics Cooperative  Mood Pleasant  Thought Process  Coherency WDL  Content Preoccupation  Delusions None reported or observed  Perception WDL  Hallucination None reported or observed  Judgment Impaired  Confusion None  Danger to Self  Current suicidal ideation? Denies  Agreement Not to Harm Self Yes  Description of Agreement verbal  Danger to Others  Danger to Others None reported or observed

## 2022-12-16 NOTE — Group Note (Signed)
Date:  12/16/2022 Time:  9:07 PM  Group Topic/Focus:  Wrap-Up Group:   The focus of this group is to help patients review their daily goal of treatment and discuss progress on daily workbooks.    Participation Level:  Active  Participation Quality:  Appropriate  Affect:  Appropriate  Cognitive:  Appropriate  Insight: Appropriate  Engagement in Group:  Engaged  Modes of Intervention:  Education and Exploration  Additional Comments:  Patient attended and participated in group tonight. She reports that her husband visited with her today, that was significant to her. Today she is felt much better, she is feeling like herself again.   Lita Mains [facility] 12/16/2022, 9:07 PM

## 2022-12-16 NOTE — Progress Notes (Signed)
DAR NOTE: Patient presents with calm affect and mood.  Denies suicidal thoughts, auditory and visual hallucinations.  Rates depression at 1, hopelessness at 1, and anxiety at 1.  Maintained on routine safety checks.  Medications given as prescribed.  Support and encouragement offered as needed.  Attended group and participated.  States goal for today is "getting ready for discharge."  Patient visible in the dayroom interacting well with staff and peers.  Offered no complaint.

## 2022-12-16 NOTE — Progress Notes (Signed)
[facility] MD Progress Note  12/16/2022 6:37 PM Barbara George  MRN:  [id number] Principal Problem: Schizoaffective disorder, bipolar type (HCC) Diagnosis: Principal Problem:   Schizoaffective disorder, bipolar type (HCC)  Reason for admission: Barbara George is a 27 y.o. [place]- American female with prior psychiatric history significant for schizoaffective disorder bipolar type who presents voluntarily to [facility] from [facility] after stabilization for worsening re-occurrence of auditory hallucinations.   24-hour chart review: Heart reports elevated earlier today morning, with a low blood pressure, but that has resolved.  Fluids being encouraged.  Patient remains compliant with medications.  Patient assessment notes:  Patient was seen after morning medications earlier today morning, she denied SI/HI/AVH.  Denied paranoia, denied delusional thoughts.  Last agitation protocol medications were given on 07/12.  Manic type symptoms reported by nursing during morning hurdle, but there was no documentation of such.  Patient was reported to be trying to pick of her blouse, and "flash" the female patients on the unit.  Patient is documented as being "appropriate", "engaged" in discussions during group activities, "pleasant", but continues to be lacking in insight and is disorganized.  Patient reported to writer that she slept well, denies medication related side effects, denies being in any physical pain, denied any other concerns.  Initial plan was to discharge patient on 7/15, as long as she continues to improve.  Patient is currently on Ativan 1 mg twice daily for anxiety, and it ends tonight.  Patient will be monitored overnight, and decision will be made in the morning to determine to discharge patient if no more manic type symptoms are observed.  We will most likely push discharge to 7/16 if any inappropriate behavior is observed.  Pharmacist had indicated that  patient's insurance company is requiring extremely high co-pays in amounts of > $300 monthly for Abilify Maintena.  Pharmacist to inquire on Monday 7/15 if patient qualifies for any pharmaceutical assistance for this medication.  A determination will be made regarding giving patient Abilify maintaina depending on if she qualifies for the patient assistance program.  We are increasing Abilify p.o. to 20 mg daily for management of mood, as reports of hypomania earlier today morning are concerning, since this is not the patient's baseline.  Total Time spent with patient: 45 minutes  Past Psychiatric History: Previous Psych Diagnoses:  History of schizoaffective disorder bipolar type Prior inpatient treatment: Multiple inpatient psychiatric treatment at [facility], [place] in [place], [place] behavioral health patient does not remember the dates of the year.  Patient chart review, patient was admitted in March 2018 at [facility], in April 2018 patient was admitted at [facility] behavioral health, in May 2020 patient was admitted at [facility] behavioral health, and in September 2021 patient was admitted and treated at [facility]. Current/prior outpatient treatment: Receiving medication management at [facility] behavioral health urgent care denies Prior rehab hx: Denies Psychotherapy hx: Yes History of suicide: Denies history of suicide due to religious and cultural reasons History of homicide or aggression: Denies Psychiatric medication history: Patient has been on trial Zyprexa and Abilify Psychiatric medication compliance history: Noncompliance Neuromodulation history: Denies Current Psychiatrist: Denies Current therapist: Denies  Past Medical History:  Past Medical History:  Diagnosis Date   Bipolar 1 disorder (HCC)    Depression    Diabetes mellitus without complication (HCC)    Type 2 (Pre)   Schizophrenia (HCC)    History  reviewed. No pertinent surgical  history. Family History:  Family History  Problem Relation Age of Onset   Hypertension Father    Family Psychiatric  History: See H&P  Social History:  Social History   Substance and Sexual Activity  Alcohol Use No   Alcohol/week: 0.0 standard drinks of alcohol     Social History   Substance and Sexual Activity  Drug Use No    Social History   Socioeconomic History   Marital status: Married    Spouse name: Amadou   Number of children: Not on file   Years of education: Not on file   Highest education level: Associate degree: occupational, Scientist, product/process development, or vocational program  Occupational History   Occupation: unemployed  Tobacco Use   Smoking status: Never   Smokeless tobacco: Never  Vaping Use   Vaping status: Never Used  Substance and Sexual Activity   Alcohol use: No    Alcohol/week: 0.0 standard drinks of alcohol   Drug use: No   Sexual activity: Yes    Birth control/protection: None  Other Topics Concern   Not on file  Social History Narrative   ** Merged History Encounter **       Social Determinants of Health   Financial Resource Strain: Not on file  Food Insecurity: No Food Insecurity (12/11/2022)   Hunger Vital Sign    Worried About Running Out of Food in the Last Year: Never true    Ran Out of Food in the Last Year: Never true  Transportation Needs: No Transportation Needs (12/11/2022)   PRAPARE - Administrator, Civil Service (Medical): No    Lack of Transportation (Non-Medical): No  Physical Activity: Inactive (04/03/2019)   Exercise Vital Sign    Days of Exercise per Week: 0 days    Minutes of Exercise per Session: 0 min  Stress: Not on file  Social Connections: Socially Integrated (04/03/2019)   Social Connection and Isolation Panel [NHANES]    Frequency of Communication with Friends and Family: More than three times a week    Frequency of Social Gatherings with Friends and Family: Once a week     Attends Religious Services: More than 4 times per year    Active Member of [employer] or Organizations: Yes    Attends Engineer, structural: Not on file    Marital Status: Married   Additional Social History:    Sleep: Good  Appetite:  Good  Current Medications: Current Facility-Administered Medications  Medication Dose Route Frequency Provider Last Rate Last Admin   acetaminophen (TYLENOL) tablet 650 mg  650 mg Oral Q6H PRN Peterson Ao, MD   650 mg at 12/13/22 1126   alum & mag hydroxide-simeth (MAALOX/MYLANTA) 200-200-20 MG/5ML suspension 30 mL  30 mL Oral Q4H PRN Peterson Ao, MD       [START ON 12/17/2022] ARIPiprazole (ABILIFY) tablet 20 mg  20 mg Oral Daily Reizel Calzada, NP       diphenhydrAMINE (BENADRYL) capsule 50 mg  50 mg Oral TID PRN Peterson Ao, MD   50 mg at 12/14/22 0404   Or   diphenhydrAMINE (BENADRYL) injection 50 mg  50 mg Intramuscular TID PRN Peterson Ao, MD       haloperidol (HALDOL) tablet 5 mg  5 mg Oral TID PRN Peterson Ao, MD   5 mg at 12/14/22 0404   Or   haloperidol lactate (HALDOL) injection 5 mg  5 mg Intramuscular TID PRN Peterson Ao, MD       hydrOXYzine (ATARAX) tablet  50 mg  50 mg Oral TID PRN Starleen Blue, NP   50 mg at 12/15/22 2050   LORazepam (ATIVAN) tablet 2 mg  2 mg Oral TID PRN Peterson Ao, MD   2 mg at 12/14/22 0404   Or   LORazepam (ATIVAN) injection 2 mg  2 mg Intramuscular TID PRN Peterson Ao, MD       magnesium hydroxide (MILK OF MAGNESIA) suspension 30 mL  30 mL Oral Daily PRN Peterson Ao, MD       simvastatin (ZOCOR) tablet 10 mg  10 mg Oral q1800 Ntuen, Jesusita Oka, FNP   10 mg at 12/16/22 1713   traZODone (DESYREL) tablet 150 mg  150 mg Oral QHS Starleen Blue, NP   150 mg at 12/15/22 2050   Lab Results: No results found for this or any previous visit (from the past 48 hour(s)).  Blood Alcohol level:  Lab Results  Component Value Date   ETH <10 12/11/2022   ETH <10 02/20/2020   Metabolic  Disorder Labs: Lab Results  Component Value Date   HGBA1C 6.2 (H) 12/11/2022   MPG 131 12/11/2022   MPG 111.15 10/23/2018   Lab Results  Component Value Date   PROLACTIN 13.0 12/11/2022   PROLACTIN CANCELED 09/17/2022   Lab Results  Component Value Date   CHOL 228 (H) 12/11/2022   TRIG 122 12/11/2022   HDL 91 12/11/2022   CHOLHDL 2.5 12/11/2022   VLDL 24 12/11/2022   LDLCALC 113 (H) 12/11/2022   LDLCALC 95 10/23/2018   Physical Findings: AIMS:  , ,  ,  ,    CIWA:    COWS:     Musculoskeletal: Strength & Muscle Tone: within normal limits Gait & Station: normal Patient leans: N/A  Psychiatric Specialty Exam:  Presentation  General Appearance:  Appropriate for Environment; Fairly Groomed  Eye Contact: Fair  Speech: Clear and Coherent  Speech Volume: Normal  Handedness: Right  Mood and Affect  Mood: Depressed  Affect: Congruent  Thought Process  Thought Processes: Coherent  Descriptions of Associations:Intact  Orientation:Full (Time, Place and Person)  Thought Content:Logical  History of Schizophrenia/Schizoaffective disorder:Yes  Duration of Psychotic Symptoms:Greater than six months  Hallucinations:Hallucinations: None  Ideas of Reference:None  Suicidal Thoughts:Suicidal Thoughts: No  Homicidal Thoughts:Homicidal Thoughts: No  Sensorium  Memory: Immediate Good  Judgment: Fair  Insight: Poor  Executive Functions  Concentration: Fair  Attention Span: Fair  Recall: Fair  Fund of Knowledge: Fair  Language: Fair  Psychomotor Activity  Psychomotor Activity: Psychomotor Activity: Normal  Assets  Assets: Communication Skills; Resilience  Sleep  Sleep: Sleep: Good  Physical Exam: Physical Exam Vitals and nursing note reviewed.  Constitutional:      Appearance: She is obese.  HENT:     Head: Normocephalic.     Nose: Nose normal.     Mouth/Throat:     Mouth: Mucous membranes are moist.  Eyes:      Extraocular Movements: Extraocular movements intact.     Pupils: Pupils are equal, round, and reactive to light.  Cardiovascular:     Rate and Rhythm: Tachycardia present.  Pulmonary:     Effort: Pulmonary effort is normal.  Abdominal:     Comments: Deferred  Genitourinary:    Comments: Deferred Musculoskeletal:        General: Normal range of motion.     Cervical back: Normal range of motion.  Skin:    General: Skin is warm.  Neurological:     General: No focal  deficit present.     Mental Status: She is alert and oriented to person, place, and time.  Psychiatric:        Behavior: Behavior normal.     Comments: Smiling unnecessarily    Review of Systems  Constitutional:  Negative for chills and fever.  HENT:  Negative for sore throat.   Eyes: Negative.   Respiratory:  Negative for cough, shortness of breath and wheezing.   Cardiovascular:  Negative for chest pain and palpitations.       Blood pressure 126/98, pulse 104.  Patient is asymptomatic, nursing staff to recheck vital signs.  Gastrointestinal:  Negative for abdominal pain, heartburn, nausea and vomiting.  Genitourinary: Negative.   Musculoskeletal: Negative.  Negative for falls and myalgias.  Skin:  Negative for itching and rash.  Neurological:  Negative for dizziness, tingling, tremors and headaches.  Endo/Heme/Allergies:        See allergy listing  Psychiatric/Behavioral:  Positive for depression and hallucinations. Negative for memory loss, substance abuse and suicidal ideas. The patient is nervous/anxious and has insomnia.    Blood pressure 121/70, pulse 97, temperature 98.1 F (36.7 C), temperature source Oral, resp. rate 18, height 5\' 8"  (1.727 m), weight 108.6 kg, SpO2 100%. Body mass index is 36.4 kg/m.  Treatment Plan Summary: Daily contact with patient to assess and evaluate symptoms and progress in treatment and Medication management  Primary Diagnosis: Schizoaffective disorder, bipolar type (HCC)    Plan: Medications: -Continue Ativan 1 mg BID x 2 days for anxiety, then stop -Increase Abilify to 20 mg p.o. daily for psychosis and mood stabilization starting 7/13 -Continue trazodone 150 mg p.o. nightly for insomnia  -Continue hydroxyzine 50 mg tablet p.o. 3 times daily as needed anxiety -Continue simvastatin 10 mg p.o. at 1800 daily for hyperlipidemia   Agitation protocol: Benadryl capsule 50 mg p.o. or IM 3 times daily as needed agitation   Haldol tablets 5 mg po IM 3 times daily as needed agitation   Lorazepam tablet 2 mg p.o. or IM 3 times daily as needed agitation     Other PRN Medications -Acetaminophen 650 mg every 6 as needed/mild pain -Maalox 30 mL oral every 4 as needed/digestion -Magnesium hydroxide 30 mL daily as needed/mild constipation   -- The risks/benefits/side-effects/alternatives to this medication were discussed in detail with the patient and time was given for questions. The patient consents to medication trial.              -- Encouraged patient to participate  in unit milieu and in scheduled group therapies    Safety and Monitoring: Voluntary admission to inpatient psychiatric unit for safety, stabilization and treatment Daily contact with patient to assess and evaluate symptoms and progress in treatment Patient's case to be discussed in multi-disciplinary team meeting Observation Level : q15 minute checks Vital signs: q12 hours Precautions: suicide, but pt currently verbally contracts for safety on unit    Discharge Planning: Social work and case management to assist with discharge planning and identification of hospital follow-up needs prior to discharge Estimated LOS: 5-7 days Discharge Concerns: Need to establish a safety plan; Medication compliance and effectiveness Discharge Goals: Return home with outpatient referrals for mental health follow-up including medication management/psychotherapy.   Long Term Goal(s): Improvement in symptoms so as  ready for discharge   Short Term Goals: Ability to identify changes in lifestyle to reduce recurrence of condition will improve, Ability to verbalize feelings will improve, Ability to disclose and discuss suicidal ideas, Ability to  demonstrate self-control will improve, Ability to identify and develop effective coping behaviors will improve, Ability to maintain clinical measurements within normal limits will improve, Compliance with prescribed medications will improve, and Ability to identify triggers associated with substance abuse/mental health issues will improve   I certify that inpatient services furnished can reasonably be expected to improve the patient's condition.    Starleen Blue, NP 12/16/2022, 6:37 PM Patient ID: Barbara George, female   DOB: [birth year], 27 y.o.  Patient ID: Barbara George, female   DOB: July 05, 1995, 27 y.o.   MRN: [id number]

## 2022-12-16 NOTE — [facility] Group Notes (Signed)
Adult Psychoeducational Group Note  Date:  12/16/2022 Time:  6:18 PM  Group Topic/Focus:  Goals Group:   The focus of this group is to help patients establish daily goals to achieve during treatment and discuss how the patient can incorporate goal setting into their daily lives to aide in recovery. Orientation:   The focus of this group is to educate the patient on the purpose and policies of crisis stabilization and provide a format to answer questions about their admission.  The group details unit policies and expectations of patients while admitted.  Participation Level:  Active  Participation Quality:  Attentive  Affect:  Appropriate  Cognitive:  Appropriate  Insight: Appropriate  Engagement in Group:  Engaged  Modes of Intervention:  Discussion  Additional Comments:  Pt attended the goals group and remained appropriate and engaged throughout the duration of the group.   Fara Olden O 12/16/2022, 6:18 PM

## 2022-12-17 ENCOUNTER — Encounter (HOSPITAL_COMMUNITY): Payer: Self-pay

## 2022-12-17 ENCOUNTER — Other Ambulatory Visit (HOSPITAL_COMMUNITY): Payer: Self-pay

## 2022-12-17 DIAGNOSIS — F250 Schizoaffective disorder, bipolar type: Secondary | ICD-10-CM | POA: Diagnosis not present

## 2022-12-17 NOTE — Plan of Care (Signed)
  Problem: Health Behavior/Discharge Planning: Goal: Identification of resources available to assist in meeting health care needs will improve Outcome: Progressing Goal: Compliance with treatment plan for underlying cause of condition will improve Outcome: Progressing   Problem: Physical Regulation: Goal: Ability to maintain clinical measurements within normal limits will improve Outcome: Progressing   Problem: Safety: Goal: Periods of time without injury will increase Outcome: Progressing   Problem: Activity: Goal: Will verbalize the importance of balancing activity with adequate rest periods Outcome: Progressing

## 2022-12-17 NOTE — BH IP Treatment Plan (Signed)
Interdisciplinary Treatment and Diagnostic Plan Update  12/17/2022 Time of Session: 9:45 AM ( UPDATE )  Barbara George MRN: [id number]  Principal Diagnosis: Schizoaffective disorder, bipolar type (HCC)  Secondary Diagnoses: Principal Problem:   Schizoaffective disorder, bipolar type (HCC)   Current Medications:  Current Facility-Administered Medications  Medication Dose Route Frequency Provider Last Rate Last Admin   acetaminophen (TYLENOL) tablet 650 mg  650 mg Oral Q6H PRN Peterson Ao, MD   650 mg at 12/17/22 1352   alum & mag hydroxide-simeth (MAALOX/MYLANTA) 200-200-20 MG/5ML suspension 30 mL  30 mL Oral Q4H PRN Peterson Ao, MD   30 mL at 12/16/22 2213   ARIPiprazole (ABILIFY) tablet 20 mg  20 mg Oral Daily Starleen Blue, NP   20 mg at 12/17/22 0806   diphenhydrAMINE (BENADRYL) capsule 50 mg  50 mg Oral TID PRN Peterson Ao, MD   50 mg at 12/14/22 0404   Or   diphenhydrAMINE (BENADRYL) injection 50 mg  50 mg Intramuscular TID PRN Peterson Ao, MD       haloperidol (HALDOL) tablet 5 mg  5 mg Oral TID PRN Peterson Ao, MD   5 mg at 12/14/22 0404   Or   haloperidol lactate (HALDOL) injection 5 mg  5 mg Intramuscular TID PRN Peterson Ao, MD       hydrOXYzine (ATARAX) tablet 50 mg  50 mg Oral TID PRN Starleen Blue, NP   50 mg at 12/15/22 2050   LORazepam (ATIVAN) tablet 2 mg  2 mg Oral TID PRN Peterson Ao, MD   2 mg at 12/14/22 0404   Or   LORazepam (ATIVAN) injection 2 mg  2 mg Intramuscular TID PRN Peterson Ao, MD       magnesium hydroxide (MILK OF MAGNESIA) suspension 30 mL  30 mL Oral Daily PRN Peterson Ao, MD       simvastatin (ZOCOR) tablet 10 mg  10 mg Oral q1800 Ntuen, Jesusita Oka, FNP   10 mg at 12/16/22 1713   traZODone (DESYREL) tablet 150 mg  150 mg Oral QHS Starleen Blue, NP   150 mg at 12/16/22 2054   PTA Medications: Medications Prior to Admission  Medication Sig Dispense Refill Last Dose   OLANZapine (ZYPREXA) 15 MG tablet Take  1 tablet (15 mg total) by mouth at bedtime. 30 tablet 3     Patient Stressors: Medication change or noncompliance    Patient Strengths: Supportive family/friends   Treatment Modalities: Medication Management, Group therapy, Case management,  1 to 1 session with clinician, Psychoeducation, Recreational therapy.   Physician Treatment Plan for Primary Diagnosis: Schizoaffective disorder, bipolar type (HCC) Long Term Goal(s): Improvement in symptoms so as ready for discharge   Short Term Goals: Ability to identify changes in lifestyle to reduce recurrence of condition will improve Ability to verbalize feelings will improve Ability to disclose and discuss suicidal ideas Ability to demonstrate self-control will improve Ability to identify and develop effective coping behaviors will improve Ability to maintain clinical measurements within normal limits will improve Compliance with prescribed medications will improve Ability to identify triggers associated with substance abuse/mental health issues will improve  Medication Management: Evaluate patient's response, side effects, and tolerance of medication regimen.  Therapeutic Interventions: 1 to 1 sessions, Unit Group sessions and Medication administration.  Evaluation of Outcomes: Progressing  Physician Treatment Plan for Secondary Diagnosis: Principal Problem:   Schizoaffective disorder, bipolar type (HCC)  Long Term Goal(s): Improvement in symptoms so as ready for discharge   Short Term Goals: Ability  to identify changes in lifestyle to reduce recurrence of condition will improve Ability to verbalize feelings will improve Ability to disclose and discuss suicidal ideas Ability to demonstrate self-control will improve Ability to identify and develop effective coping behaviors will improve Ability to maintain clinical measurements within normal limits will improve Compliance with prescribed medications will improve Ability to identify  triggers associated with substance abuse/mental health issues will improve     Medication Management: Evaluate patient's response, side effects, and tolerance of medication regimen.  Therapeutic Interventions: 1 to 1 sessions, Unit Group sessions and Medication administration.  Evaluation of Outcomes: Progressing   RN Treatment Plan for Primary Diagnosis: Schizoaffective disorder, bipolar type (HCC) Long Term Goal(s): Knowledge of disease and therapeutic regimen to maintain health will improve  Short Term Goals: Ability to remain free from injury will improve, Ability to verbalize frustration and anger appropriately will improve, Ability to participate in decision making will improve, Ability to verbalize feelings will improve, Ability to identify and develop effective coping behaviors will improve, and Compliance with prescribed medications will improve  Medication Management: RN will administer medications as ordered by provider, will assess and evaluate patient's response and provide education to patient for prescribed medication. RN will report any adverse and/or side effects to prescribing provider.  Therapeutic Interventions: 1 on 1 counseling sessions, Psychoeducation, Medication administration, Evaluate responses to treatment, Monitor vital signs and CBGs as ordered, Perform/monitor CIWA, COWS, AIMS and Fall Risk screenings as ordered, Perform wound care treatments as ordered.  Evaluation of Outcomes: Progressing   LCSW Treatment Plan for Primary Diagnosis: Schizoaffective disorder, bipolar type (HCC) Long Term Goal(s): Safe transition to appropriate next level of care at discharge, Engage patient in therapeutic group addressing interpersonal concerns.  Short Term Goals: Engage patient in aftercare planning with referrals and resources, Increase social support, Increase emotional regulation, Facilitate acceptance of mental health diagnosis and concerns, Identify triggers associated  with mental health/substance abuse issues, and Increase skills for wellness and recovery  Therapeutic Interventions: Assess for all discharge needs, 1 to 1 time with Social worker, Explore available resources and support systems, Assess for adequacy in community support network, Educate family and significant other(s) on suicide prevention, Complete Psychosocial Assessment, Interpersonal group therapy.  Evaluation of Outcomes: Progressing  Progress in Treatment: Attending groups: Yes. Participating in groups: Yes. Taking medication as prescribed: Yes. Toleration medication: No. Family/Significant other contact made: Yes, individual(s) contacted:  Adamimou, Adamamou [phone] and father Hamidou George [phone]   Patient understands diagnosis: No. Discussing patient identified problems/goals with staff: Yes. Medical problems stabilized or resolved: Yes. Denies suicidal/homicidal ideation: Yes. Issues/concerns per patient self-inventory: No.     New problem(s) identified: No, Describe:  None reported    New Short Term/Long Term Goal(s): medication stabilization, elimination of SI thoughts, development of comprehensive mental wellness plan.     Patient Goals:  " Learn coping skills to help with my depression "    Discharge Plan or Barriers: Patient recently admitted. CSW will continue to follow and assess for appropriate referrals and possible discharge planning.     Reason for Continuation of Hospitalization: Anxiety Depression Mania Medication stabilization Other; describe Disorganized , decreased sleep    Estimated Length of Stay: 2-3 days  Last 3 [place] Suicide Severity Risk Score: Flowsheet Row [facility] (Current[facility] Most recent reading at 12/11/2022  9:27 PM ED from 12/11/2022 in [facility] Most recent reading at 12/11/2022 11:51 AM Counselor from 12/10/2022 in  [facility] Most recent reading at 12/10/2022  8:05 AM  C-SSRS RISK CATEGORY No Risk No Risk No Risk       Last PHQ 2/9 Scores:    12/10/2022    8:03 AM 09/17/2022    1:18 PM 07/24/2021    3:15 PM  Depression screen PHQ 2/9  Decreased Interest 0 1 0  Down, Depressed, Hopeless 3 0 0  PHQ - 2 Score 3 1 0  Altered sleeping 3 0   Tired, decreased energy 1 1   Change in appetite 1 0   Feeling bad or failure about yourself  1 0   Trouble concentrating 2 0   Moving slowly or fidgety/restless 2 0   Suicidal thoughts 0 0   PHQ-9 Score 13 2   Difficult doing work/chores Very difficult      Scribe for Treatment Team: Isabella Bowens, LCSWA 12/17/2022 2:20 PM

## 2022-12-17 NOTE — Group Note (Signed)
Date:  12/17/2022 Time:  8:58 PM  Group Topic/Focus:  Wrap-Up Group:   The focus of this group is to help patients review their daily goal of treatment and discuss progress on daily workbooks.    Participation Level:  Active  Participation Quality:  Appropriate  Affect:  Appropriate  Cognitive:  Appropriate  Insight: Appropriate  Engagement in Group:  Engaged  Modes of Intervention:  Education and Exploration  Additional Comments:  Patient attended and participated in group tonight. She reports that today was a 10 for her because she has been happy all day.  Barbara George [facility] 12/17/2022, 8:58 PM

## 2022-12-17 NOTE — Progress Notes (Signed)
   12/17/22 1400  Psych Admission Type (Psych Patients Only)  Admission Status Voluntary  Psychosocial Assessment  Patient Complaints None  Eye Contact Fair  Facial Expression Animated  Affect Appropriate to circumstance  Speech Logical/coherent  Interaction Assertive  Motor Activity Fidgety  Appearance/Hygiene Unremarkable  Behavior Characteristics Cooperative  Mood Pleasant  Thought Process  Coherency WDL  Content Preoccupation  Delusions None reported or observed  Perception WDL  Hallucination None reported or observed  Judgment Impaired  Confusion None  Danger to Self  Current suicidal ideation? Denies  Danger to Others  Danger to Others None reported or observed

## 2022-12-17 NOTE — TOC Benefit Eligibility Note (Signed)
Pharmacy Patient Advocate Encounter  Insurance verification completed.    The patient is insured through [employer] [place]   Ran test claim for Abilify Maintena 400 mg prefilled syringe and the current 30 day co-pay is $4.00.   This test claim was processed through [facility]- copay amounts may vary at other pharmacies due to pharmacy/plan contracts, or as the patient moves through the different stages of their insurance plan.    Roland Earl, CPHT Pharmacy Patient Advocate Specialist [facility] Patient Advocate Team Direct Number: [phone]  Fax: [phone]

## 2022-12-17 NOTE — Group Note (Signed)
LCSW Group Therapy Note  Group Date: 12/17/2022 Start Time: 1300 End Time: 1400   Type of Therapy and Topic:  Group Therapy - How To Cope with Nervousness about Discharge   Participation Level:  Active   Description of Group This process group involved identification of patients' feelings about discharge. Some of them are scheduled to be discharged soon, while others are new admissions, but each of them was asked to share thoughts and feelings surrounding discharge from the hospital. One common theme was that they are excited at the prospect of going home, while another was that many of them are apprehensive about sharing why they were hospitalized. Patients were given the opportunity to discuss these feelings with their peers in preparation for discharge.  Therapeutic Goals  Patient will identify their overall feelings about pending discharge. Patient will think about how they might proactively address issues that they believe will once again arise once they get home (i.e. with parents). Patients will participate in discussion about having hope for change.   Summary of Patient Progress:  Patient  was very active throughout the session, but all over the place with answers that were not relatable to topic and laughing saying " nothing " when CSW ask for her to explain . Patient demonstrated good insight into the subject matter, and proved open to input from peers and feedback from CSW. Patient shared that she did not need medication because it will slow her down when she start school. Patient was respectful of peers and participated throughout the entire session.   Therapeutic Modalities Cognitive Behavioral Therapy   Beather Arbour 12/17/2022  3:02 PM

## 2022-12-17 NOTE — Progress Notes (Signed)
[facility] MD Progress Note  12/17/2022 5:59 PM Barbara George  MRN:  [id number] Principal Problem: Schizoaffective disorder, bipolar type (HCC) Diagnosis: Principal Problem:   Schizoaffective disorder, bipolar type (HCC)  Reason for admission: Barbara George is a 27 y.o. [place]- American female with prior psychiatric history significant for schizoaffective disorder bipolar type who presents voluntarily to [facility] from [facility] after stabilization for worsening re-occurrence of auditory hallucinations.   24-hour chart review: BP today afternoon is 148/85, heart rate earlier in the morning was 102.  BP earlier today morning was 120/59.  Will continue to watch states, as it is possible that the fluctuation is related to wrong size BP cuff.  Patient is compliant with scheduled medications, no agitation protocol medication has been administered sayings 07/12.  Patient required trazodone 150 mg which is currently scheduled for sleep last night.  As per nursing reports, patient is remaining hypomanic; observed to be changing clothing multiple times during the day, and as per nursing staff she changed her clothing 4 times by the time 11 AM arrived early this morning.   Patient assessment notes:  During encounter today, patient is anxious & dysphoric, she denies SI, denies HI, denies AVH.  She denies paranoia, and there is no evidence of delusional thinking.  She reports a good sleep quality last night, reports a good appetite, denies medication related side effects.  States that she is moving her bowels well, denies being in any physical pain. Pt reports a good night's sleep last night, reports a good appetite, denies medication related side effects.  Patient is observed to be sitting in the day room with revealing clothing, which is not typical for a person of her religion affiliation (Muslim), she is observed by Clinical research associate to be going between wearing clothes with arms  and chest area exposed, to putting on the hijab, then taking off the hijab, and revealing her hair and arms and chest area again.  Writer made attempts to call patient's husband to ascertain if patient typically dresses this way, and to find out if this is patient's baseline, but the call was not anxiety.  Writer called on both of the phone numbers that patient provided, and also called patient's father via phone number listed on the chart, but there was no answer.  Nursing staff made aware, to inquire from her visitor tonight regarding if she is currently at her baseline of functioning, as considerations were in place to discharge her tomorrow 7/16. We are withholding giving patient the Abilify Maintena until we find out if she is back to her baseline of functioning prior to administration. Holding the LAI at this time just in case we need to adjust medications that she is currently on. We are continuing meds for now as listed below. No TD/EPS type symptoms found on assessment, and pt denies any feelings of stiffness. AIMS: 0.    Total Time spent with patient: 45 minutes  Past Psychiatric History: Previous Psych Diagnoses:  History of schizoaffective disorder bipolar type Prior inpatient treatment: Multiple inpatient psychiatric treatment at [facility] [place] in [place], [place] behavioral health patient does not remember the dates of the year.  Patient chart review, patient was admitted in March 2018 at [facility], in April 2018 patient was admitted at [facility] behavioral health, in May 2020 patient was admitted at [facility] behavioral health, and in September 2021 patient was admitted and treated at [facility]. Current/prior outpatient treatment:  Receiving medication management at [facility] behavioral health urgent care denies Prior rehab hx: Denies Psychotherapy hx: Yes History of suicide: Denies history of suicide due to religious and  cultural reasons History of homicide or aggression: Denies Psychiatric medication history: Patient has been on trial Zyprexa and Abilify Psychiatric medication compliance history: Noncompliance Neuromodulation history: Denies Current Psychiatrist: Denies Current therapist: Denies  Past Medical History:  Past Medical History:  Diagnosis Date   Bipolar 1 disorder (HCC)    Depression    Diabetes mellitus without complication (HCC)    Type 2 (Pre)   Schizophrenia (HCC)    History reviewed. No pertinent surgical history. Family History:  Family History  Problem Relation Age of Onset   Hypertension Father    Family Psychiatric  History: See H&P  Social History:  Social History   Substance and Sexual Activity  Alcohol Use No   Alcohol/week: 0.0 standard drinks of alcohol     Social History   Substance and Sexual Activity  Drug Use No    Social History   Socioeconomic History   Marital status: Married    Spouse name: Amadou   Number of children: Not on file   Years of education: Not on file   Highest education level: Associate degree: occupational, Scientist, product/process development, or vocational program  Occupational History   Occupation: unemployed  Tobacco Use   Smoking status: Never   Smokeless tobacco: Never  Vaping Use   Vaping status: Never Used  Substance and Sexual Activity   Alcohol use: No    Alcohol/week: 0.0 standard drinks of alcohol   Drug use: No   Sexual activity: Yes    Birth control/protection: None  Other Topics Concern   Not on file  Social History Narrative   ** Merged History Encounter **       Social Determinants of Health   Financial Resource Strain: Not on file  Food Insecurity: No Food Insecurity (12/11/2022)   Hunger Vital Sign    Worried About Running Out of Food in the Last Year: Never true    Ran Out of Food in the Last Year: Never true  Transportation Needs: No Transportation Needs (12/11/2022)   PRAPARE - Administrator, Civil Service  (Medical): No    Lack of Transportation (Non-Medical): No  Physical Activity: Inactive (04/03/2019)   Exercise Vital Sign    Days of Exercise per Week: 0 days    Minutes of Exercise per Session: 0 min  Stress: Not on file  Social Connections: Socially Integrated (04/03/2019)   Social Connection and Isolation Panel [NHANES]    Frequency of Communication with Friends and Family: More than three times a week    Frequency of Social Gatherings with Friends and Family: Once a week    Attends Religious Services: More than 4 times per year    Active Member of [employer] or Organizations: Yes    Attends Banker Meetings: Not on file    Marital Status: Married   Additional Social History:    Sleep: Good  Appetite:  Good  Current Medications: Current Facility-Administered Medications  Medication Dose Route Frequency Provider Last Rate Last Admin   acetaminophen (TYLENOL) tablet 650 mg  650 mg Oral Q6H PRN Peterson Ao, MD   650 mg at 12/17/22 1352   alum & mag hydroxide-simeth (MAALOX/MYLANTA) 200-200-20 MG/5ML suspension 30 mL  30 mL Oral Q4H PRN Peterson Ao, MD   30 mL at 12/16/22 2213   ARIPiprazole (ABILIFY) tablet 20 mg  20 mg Oral Daily Starleen Blue, NP   20 mg at 12/17/22 0806   diphenhydrAMINE (BENADRYL) capsule 50 mg  50 mg Oral TID PRN Peterson Ao, MD   50 mg at 12/14/22 0404   Or   diphenhydrAMINE (BENADRYL) injection 50 mg  50 mg Intramuscular TID PRN Peterson Ao, MD       haloperidol (HALDOL) tablet 5 mg  5 mg Oral TID PRN Peterson Ao, MD   5 mg at 12/14/22 0404   Or   haloperidol lactate (HALDOL) injection 5 mg  5 mg Intramuscular TID PRN Peterson Ao, MD       hydrOXYzine (ATARAX) tablet 50 mg  50 mg Oral TID PRN Starleen Blue, NP   50 mg at 12/15/22 2050   LORazepam (ATIVAN) tablet 2 mg  2 mg Oral TID PRN Peterson Ao, MD   2 mg at 12/14/22 0404   Or   LORazepam (ATIVAN) injection 2 mg  2 mg Intramuscular TID PRN Peterson Ao, MD        magnesium hydroxide (MILK OF MAGNESIA) suspension 30 mL  30 mL Oral Daily PRN Peterson Ao, MD       simvastatin (ZOCOR) tablet 10 mg  10 mg Oral q1800 Ntuen, Jesusita Oka, FNP   10 mg at 12/17/22 1712   traZODone (DESYREL) tablet 150 mg  150 mg Oral QHS Starleen Blue, NP   150 mg at 12/16/22 2054   Lab Results: No results found for this or any previous visit (from the past 48 hour(s)).  Blood Alcohol level:  Lab Results  Component Value Date   ETH <10 12/11/2022   ETH <10 02/20/2020   Metabolic Disorder Labs: Lab Results  Component Value Date   HGBA1C 6.2 (H) 12/11/2022   MPG 131 12/11/2022   MPG 111.15 10/23/2018   Lab Results  Component Value Date   PROLACTIN 13.0 12/11/2022   PROLACTIN CANCELED 09/17/2022   Lab Results  Component Value Date   CHOL 228 (H) 12/11/2022   TRIG 122 12/11/2022   HDL 91 12/11/2022   CHOLHDL 2.5 12/11/2022   VLDL 24 12/11/2022   LDLCALC 113 (H) 12/11/2022   LDLCALC 95 10/23/2018   Physical Findings: AIMS:  , ,  ,  ,    CIWA:    COWS:     Musculoskeletal: Strength & Muscle Tone: within normal limits Gait & Station: normal Patient leans: N/A  Psychiatric Specialty Exam:  Presentation  General Appearance:  Appropriate for Environment; Fairly Groomed  Eye Contact: Fair  Speech: Clear and Coherent  Speech Volume: Normal  Handedness: Right  Mood and Affect  Mood: Anxious  Affect: Congruent  Thought Process  Thought Processes: Coherent  Descriptions of Associations:Intact  Orientation:Partial  Thought Content:Logical  History of Schizophrenia/Schizoaffective disorder:Yes  Duration of Psychotic Symptoms:Greater than six months  Hallucinations:Hallucinations: None  Ideas of Reference:None  Suicidal Thoughts:Suicidal Thoughts: No  Homicidal Thoughts:Homicidal Thoughts: No  Sensorium  Memory: Immediate Good  Judgment: Fair  Insight: Fair  Chartered certified accountant: Fair  Attention  Span: Fair  Recall: [employer] of Knowledge: Fair  Language: Fair  Psychomotor Activity  Psychomotor Activity: Psychomotor Activity: Normal  Assets  Assets: Housing; Resilience; Social Support  Sleep  Sleep: Sleep: Good  Physical Exam: Physical Exam Vitals and nursing note reviewed.  Constitutional:      Appearance: She is obese.  HENT:     Head: Normocephalic.     Nose: Nose normal.     Mouth/Throat:  Mouth: Mucous membranes are moist.  Eyes:     Extraocular Movements: Extraocular movements intact.     Pupils: Pupils are equal, round, and reactive to light.  Cardiovascular:     Rate and Rhythm: Tachycardia present.  Pulmonary:     Effort: Pulmonary effort is normal.  Abdominal:     Comments: Deferred  Genitourinary:    Comments: Deferred Musculoskeletal:        General: Normal range of motion.     Cervical back: Normal range of motion.  Skin:    General: Skin is warm.  Neurological:     General: No focal deficit present.     Mental Status: She is alert and oriented to person, place, and time.  Psychiatric:        Behavior: Behavior normal.     Comments: Smiling unnecessarily    Review of Systems  Constitutional:  Negative for chills and fever.  HENT:  Negative for sore throat.   Eyes: Negative.   Respiratory:  Negative for cough, shortness of breath and wheezing.   Cardiovascular:  Negative for chest pain and palpitations.       Blood pressure 126/98, pulse 104.  Patient is asymptomatic, nursing staff to recheck vital signs.  Gastrointestinal:  Negative for abdominal pain, heartburn, nausea and vomiting.  Genitourinary: Negative.   Musculoskeletal: Negative.  Negative for falls and myalgias.  Skin:  Negative for itching and rash.  Neurological:  Negative for dizziness, tingling, tremors and headaches.  Endo/Heme/Allergies:        See allergy listing  Psychiatric/Behavioral:  Positive for depression and hallucinations. Negative for memory  loss, substance abuse and suicidal ideas. The patient is nervous/anxious and has insomnia.    Blood pressure (!) 148/85, pulse 86, temperature 98.3 F (36.8 C), temperature source Oral, resp. rate 18, height 5\' 8"  (1.727 m), weight 108.6 kg, SpO2 100%. Body mass index is 36.4 kg/m.  Treatment Plan Summary: Daily contact with patient to assess and evaluate symptoms and progress in treatment and Medication management  Primary Diagnosis: Schizoaffective disorder, bipolar type (HCC)   Plan: Medications: -Continue Ativan 1 mg BID x 2 days for anxiety, then stop -Increase Abilify to 20 mg p.o. daily for psychosis and mood stabilization starting 7/13 -Continue trazodone 150 mg p.o. nightly for insomnia  -Continue hydroxyzine 50 mg tablet p.o. 3 times daily as needed anxiety -Continue simvastatin 10 mg p.o. at 1800 daily for hyperlipidemia   Agitation protocol: Benadryl capsule 50 mg p.o. or IM 3 times daily as needed agitation   Haldol tablets 5 mg po IM 3 times daily as needed agitation   Lorazepam tablet 2 mg p.o. or IM 3 times daily as needed agitation     Other PRN Medications -Acetaminophen 650 mg every 6 as needed/mild pain -Maalox 30 mL oral every 4 as needed/digestion -Magnesium hydroxide 30 mL daily as needed/mild constipation   -- The risks/benefits/side-effects/alternatives to this medication were discussed in detail with the patient and time was given for questions. The patient consents to medication trial.              -- Encouraged patient to participate  in unit milieu and in scheduled group therapies    Safety and Monitoring: Voluntary admission to inpatient psychiatric unit for safety, stabilization and treatment Daily contact with patient to assess and evaluate symptoms and progress in treatment Patient's case to be discussed in multi-disciplinary team meeting Observation Level : q15 minute checks Vital signs: q12 hours Precautions: suicide, but pt  currently  verbally contracts for safety on unit    Discharge Planning: Social work and case management to assist with discharge planning and identification of hospital follow-up needs prior to discharge Estimated LOS: 5-7 days Discharge Concerns: Need to establish a safety plan; Medication compliance and effectiveness Discharge Goals: Return home with outpatient referrals for mental health follow-up including medication management/psychotherapy.   Long Term Goal(s): Improvement in symptoms so as ready for discharge   Short Term Goals: Ability to identify changes in lifestyle to reduce recurrence of condition will improve, Ability to verbalize feelings will improve, Ability to disclose and discuss suicidal ideas, Ability to demonstrate self-control will improve, Ability to identify and develop effective coping behaviors will improve, Ability to maintain clinical measurements within normal limits will improve, Compliance with prescribed medications will improve, and Ability to identify triggers associated with substance abuse/mental health issues will improve   I certify that inpatient services furnished can reasonably be expected to improve the patient's condition.    Starleen Blue, NP 12/17/2022, 5:59 PM Patient ID: Avrielle Midou George, female   DOB: Sep 29, 1995, 27 y.o.    MRN: [id number]

## 2022-12-17 NOTE — Group Note (Signed)
Recreation Therapy Group Note   Group Topic:Health and Wellness  Group Date: 12/17/2022 Start Time: 1020 End Time: 1050 Facilitators: Dannya Pitkin-McCall, LRT,CTRS Location: [address] Dayroom   Goal Area(s) Addresses:  Patient will define components of whole wellness. Patient will verbalize benefit of whole wellness.  Group Description: Exercise. LRT and patients discussed the importance of good physical health. LRT explained the activity to patients. Patients were informed they were going to take turns leading the group in the exercises of their choosing. Patients were to complete at least 20-30 minutes of exercise. Patients were encouraged to take breaks and get water as needed.   Affect/Mood: Euphoric   Participation Level: Active   Participation Quality: Independent   Behavior: Cooperative   Speech/Thought Process: Relevant   Insight: Moderate   Judgement: Moderate   Modes of Intervention: Music and Exercise   Patient Response to Interventions:  Receptive   Education Outcome:  Acknowledges education   Clinical Observations/Individualized Feedback: Pt participated in some of the exercises. Pt was expressive and in her own world at times. Pt was bright and receptive during group session.    Plan: Continue to engage patient in RT group sessions 2-3x/week.   Tomika Eckles-McCall, LRT,CTRS  12/17/2022 12:11 PM

## 2022-12-17 NOTE — Progress Notes (Signed)
   12/17/22 2200  Psych Admission Type (Psych Patients Only)  Admission Status Voluntary  Psychosocial Assessment  Patient Complaints None  Eye Contact Fair  Facial Expression Animated  Affect Appropriate to circumstance  Speech Logical/coherent  Interaction Assertive  Motor Activity Fidgety  Appearance/Hygiene Unremarkable  Behavior Characteristics Cooperative;Appropriate to situation  Mood Pleasant  Thought Process  Coherency WDL  Content Preoccupation  Delusions None reported or observed  Perception WDL  Hallucination None reported or observed  Judgment Impaired  Confusion None  Danger to Self  Current suicidal ideation? Denies  Agreement Not to Harm Self Yes  Description of Agreement verbal  Danger to Others  Danger to Others None reported or observed

## 2022-12-18 DIAGNOSIS — F250 Schizoaffective disorder, bipolar type: Secondary | ICD-10-CM | POA: Diagnosis not present

## 2022-12-18 MED ORDER — ARIPIPRAZOLE ER 400 MG IM SRER
400.0000 mg | INTRAMUSCULAR | Status: DC
  Administered 2022-12-18: 400 mg via INTRAMUSCULAR

## 2022-12-18 NOTE — Progress Notes (Signed)
Patient alert & oriented x 4. Patient complains of anxiety, restlessness and agitation. Patient given prn benadryl, ativan and haldol. Medications was effective

## 2022-12-18 NOTE — Plan of Care (Signed)
  Problem: Education: Goal: Knowledge of [facility] Education information/materials will improve Outcome: Progressing Goal: Emotional status will improve Outcome: Progressing Goal: Mental status will improve Outcome: Progressing Goal: Verbalization of understanding the information provided will improve Outcome: Progressing   Problem: Activity: Goal: Interest or engagement in activities will improve Outcome: Progressing   

## 2022-12-18 NOTE — Group Note (Signed)
Date:  12/18/2022 Time:  8:38 PM  Group Topic/Focus:  Wrap-Up Group:   The focus of this group is to help patients review their daily goal of treatment and discuss progress on daily workbooks.    Participation Level:  Active  Participation Quality:  Appropriate and Sharing  Affect:  Appropriate  Cognitive:  Appropriate  Insight: Appropriate  Engagement in Group:  Engaged  Modes of Intervention:  Discussion  Additional Comments:  The patient stated that she had a good day. The patient goal for today was to get her shot. The patient stated that getting her shot made it a good day. The patient goal for tomorrow is to go home.   Kennieth Francois 12/18/2022, 8:38 PM

## 2022-12-18 NOTE — Group Note (Signed)
Recreation Therapy Group Note   Group Topic:Coping Skills  Group Date: 12/18/2022 Start Time: 1048 End Time: 1110 Facilitators: Patrice Moates-McCall, LRT,CTRS Location: [address] Dayroom   Goal Area(s) Addresses: Patient will define what a coping skill is. Patient will successfully identify positive coping skills they can use post d/c.  Patient will acknowledge benefit(s) of using learned coping skills post d/c.    Group Description: Coping A to Z. Patient asked to identify what a coping skill is and when they use them. Patients with Clinical research associate discussed healthy versus unhealthy coping skills. Next patients were given a blank worksheet titled "Coping Skills A-Z". Patients were instructed to come up with at least one positive coping skill per letter of the alphabet.  Patients were given 15 minutes to brainstorm, before ideas were presented to the large group. Patients and LRT debriefed on the importance of coping skill selection based on situation and back-up plans when a skill tried is not effective. At the end of group, patients were given an handout of alphabetized strategies to keep for future reference.   Affect/Mood: N/A   Participation Level: Did not attend    Clinical Observations/Individualized Feedback:     Plan: Continue to engage patient in RT group sessions 2-3x/week.   Somya Jauregui-McCall, LRT,CTRS 12/18/2022 12:18 PM

## 2022-12-18 NOTE — Progress Notes (Signed)
   12/17/22 2200  Psych Admission Type (Psych Patients Only)  Admission Status Voluntary  Psychosocial Assessment  Patient Complaints None  Eye Contact Fair  Facial Expression Animated  Affect Appropriate to circumstance  Speech Logical/coherent  Interaction Assertive  Motor Activity Fidgety  Appearance/Hygiene Unremarkable  Behavior Characteristics Cooperative;Appropriate to situation  Mood Pleasant  Thought Process  Coherency WDL  Content Preoccupation  Delusions None reported or observed  Perception WDL  Hallucination None reported or observed  Judgment Impaired  Confusion None  Danger to Self  Current suicidal ideation? Denies  Agreement Not to Harm Self Yes  Description of Agreement verbal  Danger to Others  Danger to Others None reported or observed   Dar Note: Patient visible in milieu watching TV.  Interacting well with peers.  Abilify maintenance given.  No adverse reaction noted.  Routine safety checks maintained.  Patient is safe on and off the unit.

## 2022-12-18 NOTE — Group Note (Signed)
Date:  12/18/2022 Time:  10:17 AM  Group Topic/Focus:  Goals Group:   The focus of this group is to help patients establish daily goals to achieve during treatment and discuss how the patient can incorporate goal setting into their daily lives to aide in recovery. Orientation:   The focus of this group is to educate the patient on the purpose and policies of crisis stabilization and provide a format to answer questions about their admission.  The group details unit policies and expectations of patients while admitted.     Participation Level:  Did Not Attend  Barbara George [facility] 12/18/2022, 10:17 AM

## 2022-12-18 NOTE — Plan of Care (Signed)
  Problem: Activity: Goal: Interest or engagement in activities will improve Outcome: Progressing Goal: Sleeping patterns will improve Outcome: Progressing   Problem: Coping: Goal: Ability to verbalize frustrations and anger appropriately will improve Outcome: Progressing Goal: Ability to demonstrate self-control will improve Outcome: Progressing   Problem: Safety: Goal: Periods of time without injury will increase Outcome: Progressing   Problem: Education: Goal: Will be free of psychotic symptoms Outcome: Progressing Goal: Knowledge of the prescribed therapeutic regimen will improve Outcome: Progressing

## 2022-12-18 NOTE — Progress Notes (Signed)
[facility] MD Progress Note  12/18/2022 5:58 PM Barbara George  MRN:  [id number] Principal Problem: Schizoaffective disorder, bipolar type (HCC) Diagnosis: Principal Problem:   Schizoaffective disorder, bipolar type (HCC)  Reason for admission: Barbara George is a 27 y.o. [place]- American female with prior psychiatric history significant for schizoaffective disorder bipolar type who presents voluntarily to [facility] from [facility] after stabilization for worsening re-occurrence of auditory hallucinations.   24-hour chart review: BP within normal limits, patient is compliant with scheduled medications, required agitation protocol medications earlier today morning at midnight, but it seems like it was a misunderstanding as patient states that she was crying and chattering out religious incantations.  This was mistaken for patient being agitated.  Writer worked today with NP student who is Muslim, and pt explained what she was singing, and it happens to be praises for [employer].   Patient assessment notes:  During encounter today, patient is anxious & dysphoric, she denies SI, denies HI, denies AVH.  She denies paranoia, and there is no evidence of delusional thinking.  She reports a good sleep quality last night, reports a good appetite, denies medication related side effects.  States that she is moving her bowels well, denies being in any physical pain. Pt reports a good night's sleep last night, reports a good appetite, denies medication related side effects.  Patient assessment note: Pt presents with a euthymic mood, attention to personal hygiene and grooming is fair, eye contact is good, speech is clear & coherent. Thought contents are organized and logical, and pt currently denies SI/HI/AVH or paranoia. There is no evidence of delusional thoughts.    No TD/EPS type symptoms found on assessment, and pt denies any feelings of stiffness. AIMS: 0.   Patient reports  a good sleep quality last night, reports a good appetite, denies being in any physical pain.  She denies medication related side effects.  Abilify Maintena 400 mg IM administered today, plan is to discharge patient tomorrow 7/17 as long as outpatient appointments are completed, and discharge safety planning completed.  Writer spoke with patient's husband at [phone] (Mr. Kandis Nab), after obtaining patient's verbal consent.  Patient's husband states that patient is significantly better, and her mood has improved, and that she is stable enough for discharge.  Writer informed husband that patient will be discharged tomorrow 7/17.  Husband had no safety concerns, and stated that he would like for patient to be set up with therapy and medication management prior to discharge.  Writer informed husband that CSW will be reaching out to him, to safety plan prior to discharge.  We are continuing medications as listed below.  Total Time spent with patient: 45 minutes  Past Psychiatric History: Previous Psych Diagnoses:  History of schizoaffective disorder bipolar type Prior inpatient treatment: Multiple inpatient psychiatric treatment at [facility] [place], [place] in [place], [place] behavioral health patient does not remember the dates of the year.  Patient chart review, patient was admitted in March 2018 at [facility], in April 2018 patient was admitted at [facility] behavioral health, in May 2020 patient was admitted at [facility] behavioral health, and in September 2021 patient was admitted and treated at [facility] Current/prior outpatient treatment: Receiving medication management at [facility] behavioral health urgent care denies Prior rehab hx: Denies Psychotherapy hx: Yes History of suicide: Denies history of suicide due to religious and cultural reasons History of homicide or aggression: Denies Psychiatric medication history: Patient  has been on  trial Zyprexa and Abilify Psychiatric medication compliance history: Noncompliance Neuromodulation history: Denies Current Psychiatrist: Denies Current therapist: Denies  Past Medical History:  Past Medical History:  Diagnosis Date   Bipolar 1 disorder (HCC)    Depression    Diabetes mellitus without complication (HCC)    Type 2 (Pre)   Schizophrenia (HCC)    History reviewed. No pertinent surgical history. Family History:  Family History  Problem Relation Age of Onset   Hypertension Father    Family Psychiatric  History: See H&P  Social History:  Social History   Substance and Sexual Activity  Alcohol Use No   Alcohol/week: 0.0 standard drinks of alcohol     Social History   Substance and Sexual Activity  Drug Use No    Social History   Socioeconomic History   Marital status: Married    Spouse name: Amadou   Number of children: Not on file   Years of education: Not on file   Highest education level: Associate degree: occupational, Scientist, product/process development, or vocational program  Occupational History   Occupation: unemployed  Tobacco Use   Smoking status: Never   Smokeless tobacco: Never  Vaping Use   Vaping status: Never Used  Substance and Sexual Activity   Alcohol use: No    Alcohol/week: 0.0 standard drinks of alcohol   Drug use: No   Sexual activity: Yes    Birth control/protection: None  Other Topics Concern   Not on file  Social History Narrative   ** Merged History Encounter **       Social Determinants of Health   Financial Resource Strain: Not on file  Food Insecurity: No Food Insecurity (12/11/2022)   Hunger Vital Sign    Worried About Running Out of Food in the Last Year: Never true    Ran Out of Food in the Last Year: Never true  Transportation Needs: No Transportation Needs (12/11/2022)   PRAPARE - Administrator, Civil Service (Medical): No    Lack of Transportation (Non-Medical): No  Physical Activity: Inactive (04/03/2019)   Exercise  Vital Sign    Days of Exercise per Week: 0 days    Minutes of Exercise per Session: 0 min  Stress: Not on file  Social Connections: Socially Integrated (04/03/2019)   Social Connection and Isolation Panel [NHANES]    Frequency of Communication with Friends and Family: More than three times a week    Frequency of Social Gatherings with Friends and Family: Once a week    Attends Religious Services: More than 4 times per year    Active Member of [employer] or Organizations: Yes    Attends Banker Meetings: Not on file    Marital Status: Married   Additional Social History:    Sleep: Good  Appetite:  Good  Current Medications: Current Facility-Administered Medications  Medication Dose Route Frequency Provider Last Rate Last Admin   acetaminophen (TYLENOL) tablet 650 mg  650 mg Oral Q6H PRN Peterson Ao, MD   650 mg at 12/17/22 1352   alum & mag hydroxide-simeth (MAALOX/MYLANTA) 200-200-20 MG/5ML suspension 30 mL  30 mL Oral Q4H PRN Peterson Ao, MD   30 mL at 12/16/22 2213   ARIPiprazole (ABILIFY) tablet 20 mg  20 mg Oral Daily Starleen Blue, NP   20 mg at 12/18/22 0834   ARIPiprazole ER (ABILIFY MAINTENA) injection 400 mg  400 mg Intramuscular Q28 days Starleen Blue, NP   400 mg at 12/18/22 1645  diphenhydrAMINE (BENADRYL) capsule 50 mg  50 mg Oral TID PRN Peterson Ao, MD   50 mg at 12/18/22 0002   Or   diphenhydrAMINE (BENADRYL) injection 50 mg  50 mg Intramuscular TID PRN Peterson Ao, MD       haloperidol (HALDOL) tablet 5 mg  5 mg Oral TID PRN Peterson Ao, MD   5 mg at 12/18/22 0002   Or   haloperidol lactate (HALDOL) injection 5 mg  5 mg Intramuscular TID PRN Peterson Ao, MD       hydrOXYzine (ATARAX) tablet 50 mg  50 mg Oral TID PRN Starleen Blue, NP   50 mg at 12/17/22 2035   LORazepam (ATIVAN) tablet 2 mg  2 mg Oral TID PRN Peterson Ao, MD   2 mg at 12/18/22 0002   Or   LORazepam (ATIVAN) injection 2 mg  2 mg Intramuscular TID PRN Peterson Ao, MD       magnesium hydroxide (MILK OF MAGNESIA) suspension 30 mL  30 mL Oral Daily PRN Peterson Ao, MD       simvastatin (ZOCOR) tablet 10 mg  10 mg Oral q1800 Ntuen, Tina C, FNP   10 mg at 12/18/22 1700   traZODone (DESYREL) tablet 150 mg  150 mg Oral QHS Starleen Blue, NP   150 mg at 12/17/22 2035   Lab Results: No results found for this or any previous visit (from the past 48 hour(s)).  Blood Alcohol level:  Lab Results  Component Value Date   ETH <10 12/11/2022   ETH <10 02/20/2020   Metabolic Disorder Labs: Lab Results  Component Value Date   HGBA1C 6.2 (H) 12/11/2022   MPG 131 12/11/2022   MPG 111.15 10/23/2018   Lab Results  Component Value Date   PROLACTIN 13.0 12/11/2022   PROLACTIN CANCELED 09/17/2022   Lab Results  Component Value Date   CHOL 228 (H) 12/11/2022   TRIG 122 12/11/2022   HDL 91 12/11/2022   CHOLHDL 2.5 12/11/2022   VLDL 24 12/11/2022   LDLCALC 113 (H) 12/11/2022   LDLCALC 95 10/23/2018   Physical Findings: AIMS:  , ,  ,  ,    CIWA:    COWS:     Musculoskeletal: Strength & Muscle Tone: within normal limits Gait & Station: normal Patient leans: N/A  Psychiatric Specialty Exam:  Presentation  General Appearance:  Appropriate for Environment; Fairly Groomed  Eye Contact: Fair  Speech: Clear and Coherent  Speech Volume: Normal  Handedness: Right  Mood and Affect  Mood: Euthymic  Affect: Congruent  Thought Process  Thought Processes: Coherent  Descriptions of Associations:Intact  Orientation:Full (Time, Place and Person)  Thought Content:Logical  History of Schizophrenia/Schizoaffective disorder:Yes  Duration of Psychotic Symptoms:Greater than six months  Hallucinations:Hallucinations: None  Ideas of Reference:None  Suicidal Thoughts:Suicidal Thoughts: No  Homicidal Thoughts:Homicidal Thoughts: No  Sensorium  Memory: Immediate Good  Judgment: Fair  Insight: Fair  Art therapist   Concentration: Good  Attention Span: Good  Recall: Poor  Fund of Knowledge: Fair  Language: Fair  Psychomotor Activity  Psychomotor Activity: Psychomotor Activity: Normal  Assets  Assets: Communication Skills; Social Support; Resilience  Sleep  Sleep: Sleep: Fair  Physical Exam: Physical Exam Vitals and nursing note reviewed.  Constitutional:      Appearance: She is obese.  HENT:     Head: Normocephalic.     Nose: Nose normal.     Mouth/Throat:     Mouth: Mucous membranes are moist.  Eyes:  Extraocular Movements: Extraocular movements intact.     Pupils: Pupils are equal, round, and reactive to light.  Cardiovascular:     Rate and Rhythm: Tachycardia present.  Pulmonary:     Effort: Pulmonary effort is normal.  Abdominal:     Comments: Deferred  Genitourinary:    Comments: Deferred Musculoskeletal:        General: Normal range of motion.     Cervical back: Normal range of motion.  Skin:    General: Skin is warm.  Neurological:     General: No focal deficit present.     Mental Status: She is alert and oriented to person, place, and time.  Psychiatric:        Behavior: Behavior normal.     Comments: Smiling unnecessarily    Review of Systems  Constitutional:  Negative for chills and fever.  HENT:  Negative for sore throat.   Eyes: Negative.   Respiratory:  Negative for cough, shortness of breath and wheezing.   Cardiovascular:  Negative for chest pain and palpitations.       Blood pressure 126/98, pulse 104.  Patient is asymptomatic, nursing staff to recheck vital signs.  Gastrointestinal:  Negative for abdominal pain, heartburn, nausea and vomiting.  Genitourinary: Negative.   Musculoskeletal: Negative.  Negative for falls and myalgias.  Skin:  Negative for itching and rash.  Neurological:  Negative for dizziness, tingling, tremors and headaches.  Endo/Heme/Allergies:        See allergy listing  Psychiatric/Behavioral:  Positive for  depression and hallucinations. Negative for memory loss, substance abuse and suicidal ideas. The patient is nervous/anxious and has insomnia.    Blood pressure 121/77, pulse 78, temperature 98.3 F (36.8 C), temperature source Oral, resp. rate 18, height 5\' 8"  (1.727 m), weight 108.6 kg, SpO2 100%. Body mass index is 36.4 kg/m.  Treatment Plan Summary: Daily contact with patient to assess and evaluate symptoms and progress in treatment and Medication management  Primary Diagnosis: Schizoaffective disorder, bipolar type (HCC)   Plan: Medications: -Give Abilify Maintena 400 mg IM x 1 dose on 7/16 -Continue Ativan 1 mg BID x 2 days for anxiety, then stopped -Continue Abilify to 20 mg p.o. daily for psychosis and mood stabilization -Continue trazodone 150 mg p.o. nightly for insomnia  -Continue hydroxyzine 50 mg tablet p.o. 3 times daily as needed anxiety -Continue simvastatin 10 mg p.o. at 1800 daily for hyperlipidemia   Agitation protocol: Benadryl capsule 50 mg p.o. or IM 3 times daily as needed agitation   Haldol tablets 5 mg po IM 3 times daily as needed agitation   Lorazepam tablet 2 mg p.o. or IM 3 times daily as needed agitation     Other PRN Medications -Acetaminophen 650 mg every 6 as needed/mild pain -Maalox 30 mL oral every 4 as needed/digestion -Magnesium hydroxide 30 mL daily as needed/mild constipation   -- The risks/benefits/side-effects/alternatives to this medication were discussed in detail with the patient and time was given for questions. The patient consents to medication trial.              -- Encouraged patient to participate  in unit milieu and in scheduled group therapies    Safety and Monitoring: Voluntary admission to inpatient psychiatric unit for safety, stabilization and treatment Daily contact with patient to assess and evaluate symptoms and progress in treatment Patient's case to be discussed in multi-disciplinary team meeting Observation Level :  q15 minute checks Vital signs: q12 hours Precautions: suicide, but pt currently verbally  contracts for safety on unit    Discharge Planning: Social work and case management to assist with discharge planning and identification of hospital follow-up needs prior to discharge Estimated LOS: 5-7 days Discharge Concerns: Need to establish a safety plan; Medication compliance and effectiveness Discharge Goals: Return home with outpatient referrals for mental health follow-up including medication management/psychotherapy.   Long Term Goal(s): Improvement in symptoms so as ready for discharge   Short Term Goals: Ability to identify changes in lifestyle to reduce recurrence of condition will improve, Ability to verbalize feelings will improve, Ability to disclose and discuss suicidal ideas, Ability to demonstrate self-control will improve, Ability to identify and develop effective coping behaviors will improve, Ability to maintain clinical measurements within normal limits will improve, Compliance with prescribed medications will improve, and Ability to identify triggers associated with substance abuse/mental health issues will improve   I certify that inpatient services furnished can reasonably be expected to improve the patient's condition.    Starleen Blue, NP 12/18/2022, 5:58 PM Patient ID: Barbara George, female   DOB: 1995/09/23, 27 y.o.    MRN: [id number] Patient ID: Barbara George, female   DOB: 1996/02/01, 27 y.o.   MRN: [id number]

## 2022-12-19 DIAGNOSIS — F250 Schizoaffective disorder, bipolar type: Secondary | ICD-10-CM | POA: Diagnosis not present

## 2022-12-19 MED ORDER — RISPERIDONE 1 MG PO TBDP
1.0000 mg | ORAL_TABLET | Freq: Every day | ORAL | Status: DC
  Administered 2022-12-19 – 2022-12-20 (×2): 1 mg via ORAL
  Filled 2022-12-19 (×5): qty 1

## 2022-12-19 NOTE — Progress Notes (Signed)
Staff reports that pt came up to nurses' station and asked if she would be discharged date. When pt was told that she would not discharge today, pt became agitated and stated "I'll fuck up everything!" and went back to her bedroom. Q84min safety checks in place. Pt remains safe on the unit.

## 2022-12-19 NOTE — Progress Notes (Signed)
   12/19/22 2200  Psych Admission Type (Psych Patients Only)  Admission Status Voluntary  Psychosocial Assessment  Patient Complaints Anxiety  Eye Contact Fair  Facial Expression Animated  Affect Appropriate to circumstance  Speech Logical/coherent  Interaction Assertive  Motor Activity Other (Comment)  Appearance/Hygiene Unremarkable  Behavior Characteristics Appropriate to situation  Mood Pleasant  Thought Process  Coherency WDL  Content Preoccupation  Delusions None reported or observed  Perception WDL  Hallucination None reported or observed  Judgment Impaired  Confusion None  Danger to Self  Current suicidal ideation? Denies  Agreement Not to Harm Self Yes  Description of Agreement verbal contracts for safety  Danger to Others  Danger to Others None reported or observed

## 2022-12-19 NOTE — [facility] Group Notes (Signed)
[facility] Group Notes:  (Nursing/MHT/Case Management/Adjunct)  Date:  12/19/2022  Time:  2000  Type of Therapy:   wrap up group  Participation Level:  Active  Participation Quality:  Attentive and Sharing  Affect:  Excited  Cognitive:  Alert  Insight:  Lacking  Engagement in Group:  Improving  Modes of Intervention:  Clarification, Education, and Support  Summary of Progress/Problems: Positive thinking and positive change were discussed. Pt shared her life was good and no need to change anything. She was grateful for her mother and father. Pt prompted to try and think of one thing she wants to change pt reported, bad decisions.   Marcille Buffy 12/19/2022, 8:59 PM

## 2022-12-19 NOTE — Progress Notes (Incomplete)
Patient appeared distressed, restless, agitated and expressed feelings of frustration and anxiety related to inability to sleep or  offered support and reassurance,  Administered prescribed PRN medication for agitation Encouraged patient to continue to express their feelings and concerns.Voice any additional concerns

## 2022-12-19 NOTE — Progress Notes (Signed)
   12/19/22 1800  Psych Admission Type (Psych Patients Only)  Admission Status Voluntary  Psychosocial Assessment  Patient Complaints Irritability  Eye Contact Fair  Facial Expression Animated  Affect Appropriate to circumstance  Speech Logical/coherent  Interaction Assertive  Motor Activity Other (Comment) (WDL)  Appearance/Hygiene Unremarkable  Behavior Characteristics Appropriate to situation  Mood Labile  Thought Process  Coherency WDL  Content Preoccupation  Delusions None reported or observed  Perception WDL  Hallucination None reported or observed  Judgment Impaired  Confusion None  Danger to Self  Current suicidal ideation? Denies  Agreement Not to Harm Self Yes  Description of Agreement verbal contract  Danger to Others  Danger to Others None reported or observed

## 2022-12-19 NOTE — Plan of Care (Signed)
  Problem: Education: Goal: Mental status will improve Outcome: Progressing Goal: Verbalization of understanding the information provided will improve Outcome: Progressing   Problem: Activity: Goal: Interest or engagement in activities will improve Outcome: Progressing Goal: Sleeping patterns will improve Outcome: Progressing   Problem: Coping: Goal: Ability to verbalize frustrations and anger appropriately will improve Outcome: Progressing   Problem: Health Behavior/Discharge Planning: Goal: Compliance with treatment plan for underlying cause of condition will improve Outcome: Progressing

## 2022-12-19 NOTE — Progress Notes (Signed)
[facility] MD Progress Note  12/19/2022 3:14 PM Barbara George  MRN:  [id number] Principal Problem: Schizoaffective disorder, bipolar type (HCC) Diagnosis: Principal Problem:   Schizoaffective disorder, bipolar type (HCC)  Reason for admission: Barbara George is a 27 y.o. [place]- American female with prior psychiatric history significant for schizoaffective disorder bipolar type who presents voluntarily to [facility] from [facility] after stabilization for worsening re-occurrence of auditory hallucinations.   24-hour chart review:  Sleep Hours last night: Poor as per nursing reports, but no documentation of this. Pt states she slept well. Nursing Concerns: As per nursing reports, last night, patient presented with some hypomania, was restless, unable to stay still, was loud when talking on the phone, and was difficult to direct, and did not sleep much last night.  She was also reported to be disruptive to the milieu.  As per the [facility], she received Atarax 25 mg earlier today morning, but there is no rationales as to why this medication was given, and the behaviors as reported above during morning hurdle are not in nursing documentation.  Behavioral episodes in the past 24 hrs: As above Medication Compliance: Compliant Vital Signs in the past 24 hrs: Within normal limits PRN Medications in the past 24 hrs: As above  Patient assessment notes:  Today's encounter, patient denies SI/HI/AVH.  She denies paranoia, but presents with some delusional thoughts; when questions regarding delusional thinking, patient states that people are able to take out her thoughts, and steal her ideas, but those people still work for her, and eventually are able to give her ideas back to her.  Writer asked who works for her, and patient replies that she has angels working for her.  She also states that she gets special messages from the angels, and the special messages come from God.  She  states God gives the messages to the angels, who in turn give the messages to the prophets such as herself, and the prophets gives the messages to the humans so as to guard them.  Mood is noted to be labile during this interaction, as she goes from being irritable and dysphoric, argumentative, to laughing inappropriately.  Patient is asked if her husband visited yesterday, and she asked writer "what is his name?", before laughing again inappropriately, and stating that he was fasting yesterday and could not visit with her.  We had hoped that patient will be discharged today based on yesterday's presentation, but based on presentation today, mood is not stable enough for discharge.  Patient's psychosis is persistent today, and mood is labile.  We will continue hospitalization at this time, so that we can add a second antipsychotic to patient's medication regimen for the treatment and stabilization of her mental status.  We are adding Risperdal 1 mg nightly for treatment of psychosis and mood stabilization.  We are continuing other medications as listed below. No TD/EPS type symptoms found on assessment, and pt denies any feelings of stiffness. AIMS: 0.  Patient reports a good appetite.  She denies being in any physical distress today.  Reports that she is moving her bowels well.  Last BM was last night as per patient.  Total Time spent with patient: 45 minutes  Past Psychiatric History: Previous Psych Diagnoses:  History of schizoaffective disorder bipolar type Prior inpatient treatment: Multiple inpatient psychiatric treatment at [facility], [place] in [place], [place] behavioral health patient does not remember the dates of the year.  Patient chart review,  patient was admitted in March 2018 at [facility], in April 2018 patient was admitted at [facility] behavioral health, in May 2020 patient was admitted at [facility] behavioral health, and in September 2021 patient was admitted  and treated at [facility]. Current/prior outpatient treatment: Receiving medication management at [facility] behavioral health urgent care denies Prior rehab hx: Denies Psychotherapy hx: Yes History of suicide: Denies history of suicide due to religious and cultural reasons History of homicide or aggression: Denies Psychiatric medication history: Patient has been on trial Zyprexa and Abilify Psychiatric medication compliance history: Noncompliance Neuromodulation history: Denies Current Psychiatrist: Denies Current therapist: Denies  Past Medical History:  Past Medical History:  Diagnosis Date   Bipolar 1 disorder (HCC)    Depression    Diabetes mellitus without complication (HCC)    Type 2 (Pre)   Schizophrenia (HCC)    History reviewed. No pertinent surgical history. Family History:  Family History  Problem Relation Age of Onset   Hypertension Father    Family Psychiatric  History: See H&P  Social History:  Social History   Substance and Sexual Activity  Alcohol Use No   Alcohol/week: 0.0 standard drinks of alcohol     Social History   Substance and Sexual Activity  Drug Use No    Social History   Socioeconomic History   Marital status: Married    Spouse name: Amadou   Number of children: Not on file   Years of education: Not on file   Highest education level: Associate degree: occupational, Scientist, product/process development, or vocational program  Occupational History   Occupation: unemployed  Tobacco Use   Smoking status: Never   Smokeless tobacco: Never  Vaping Use   Vaping status: Never Used  Substance and Sexual Activity   Alcohol use: No    Alcohol/week: 0.0 standard drinks of alcohol   Drug use: No   Sexual activity: Yes    Birth control/protection: None  Other Topics Concern   Not on file  Social History Narrative   ** Merged History Encounter **       Social Determinants of Health   Financial Resource Strain: Not on file  Food  Insecurity: No Food Insecurity (12/11/2022)   Hunger Vital Sign    Worried About Running Out of Food in the Last Year: Never true    Ran Out of Food in the Last Year: Never true  Transportation Needs: No Transportation Needs (12/11/2022)   PRAPARE - Administrator, Civil Service (Medical): No    Lack of Transportation (Non-Medical): No  Physical Activity: Inactive (04/03/2019)   Exercise Vital Sign    Days of Exercise per Week: 0 days    Minutes of Exercise per Session: 0 min  Stress: Not on file  Social Connections: Socially Integrated (04/03/2019)   Social Connection and Isolation Panel [NHANES]    Frequency of Communication with Friends and Family: More than three times a week    Frequency of Social Gatherings with Friends and Family: Once a week    Attends Religious Services: More than 4 times per year    Active Member of [employer] or Organizations: Yes    Attends Banker Meetings: Not on file    Marital Status: Married   Additional Social History:    Sleep: Good  Appetite:  Good  Current Medications: Current Facility-Administered Medications  Medication Dose Route Frequency Provider Last Rate Last Admin   acetaminophen (TYLENOL) tablet 650 mg  650 mg  Oral Q6H PRN Peterson Ao, MD   650 mg at 12/17/22 1352   alum & mag hydroxide-simeth (MAALOX/MYLANTA) 200-200-20 MG/5ML suspension 30 mL  30 mL Oral Q4H PRN Peterson Ao, MD   30 mL at 12/16/22 2213   ARIPiprazole (ABILIFY) tablet 20 mg  20 mg Oral Daily Starleen Blue, NP   20 mg at 12/19/22 0804   ARIPiprazole ER (ABILIFY MAINTENA) injection 400 mg  400 mg Intramuscular Q28 days Starleen Blue, NP   400 mg at 12/18/22 1645   diphenhydrAMINE (BENADRYL) capsule 50 mg  50 mg Oral TID PRN Peterson Ao, MD   50 mg at 12/18/22 0002   Or   diphenhydrAMINE (BENADRYL) injection 50 mg  50 mg Intramuscular TID PRN Peterson Ao, MD       haloperidol (HALDOL) tablet 5 mg  5 mg Oral TID PRN Peterson Ao, MD    5 mg at 12/18/22 0002   Or   haloperidol lactate (HALDOL) injection 5 mg  5 mg Intramuscular TID PRN Peterson Ao, MD       hydrOXYzine (ATARAX) tablet 50 mg  50 mg Oral TID PRN Starleen Blue, NP   50 mg at 12/19/22 0403   LORazepam (ATIVAN) tablet 2 mg  2 mg Oral TID PRN Peterson Ao, MD   2 mg at 12/18/22 0002   Or   LORazepam (ATIVAN) injection 2 mg  2 mg Intramuscular TID PRN Peterson Ao, MD       magnesium hydroxide (MILK OF MAGNESIA) suspension 30 mL  30 mL Oral Daily PRN Peterson Ao, MD       risperiDONE (RISPERDAL M-TABS) disintegrating tablet 1 mg  1 mg Oral QHS Massengill, Nathan, MD       simvastatin (ZOCOR) tablet 10 mg  10 mg Oral q1800 Ntuen, Tina C, FNP   10 mg at 12/18/22 1700   traZODone (DESYREL) tablet 150 mg  150 mg Oral QHS Starleen Blue, NP   150 mg at 12/18/22 2044   Lab Results: No results found for this or any previous visit (from the past 48 hour(s)).  Blood Alcohol level:  Lab Results  Component Value Date   ETH <10 12/11/2022   ETH <10 02/20/2020   Metabolic Disorder Labs: Lab Results  Component Value Date   HGBA1C 6.2 (H) 12/11/2022   MPG 131 12/11/2022   MPG 111.15 10/23/2018   Lab Results  Component Value Date   PROLACTIN 13.0 12/11/2022   PROLACTIN CANCELED 09/17/2022   Lab Results  Component Value Date   CHOL 228 (H) 12/11/2022   TRIG 122 12/11/2022   HDL 91 12/11/2022   CHOLHDL 2.5 12/11/2022   VLDL 24 12/11/2022   LDLCALC 113 (H) 12/11/2022   LDLCALC 95 10/23/2018   Physical Findings: AIMS:  , ,  ,  ,    CIWA:    COWS:     Musculoskeletal: Strength & Muscle Tone: within normal limits Gait & Station: normal Patient leans: N/A  Psychiatric Specialty Exam:  Presentation  General Appearance:  Appropriate for Environment; Fairly Groomed  Eye Contact: Fair  Speech: Clear and Coherent  Speech Volume: Normal  Handedness: Right  Mood and Affect  Mood: Anxious; Dysphoric  Affect: Congruent  Thought  Process  Thought Processes: Disorganized  Descriptions of Associations:Circumstantial  Orientation:Partial  Thought Content:Illogical  History of Schizophrenia/Schizoaffective disorder:Yes  Duration of Psychotic Symptoms:Greater than six months  Hallucinations:Hallucinations: None  Ideas of Reference:None  Suicidal Thoughts:Suicidal Thoughts: No  Homicidal Thoughts:Homicidal Thoughts: No  Sensorium  Memory: Immediate Good  Judgment: Poor  Insight: Poor  Executive Functions  Concentration: Poor  Attention Span: Poor  Recall: Poor  Fund of Knowledge: Poor  Language: Fair  Lexicographer Activity: Psychomotor Activity: Normal  Assets  Assets: Resilience; Social Support  Sleep  Sleep: Sleep: Fair  Physical Exam: Physical Exam Vitals and nursing note reviewed.  Constitutional:      Appearance: She is obese.  HENT:     Head: Normocephalic.     Nose: Nose normal.     Mouth/Throat:     Mouth: Mucous membranes are moist.  Eyes:     Extraocular Movements: Extraocular movements intact.     Pupils: Pupils are equal, round, and reactive to light.  Cardiovascular:     Rate and Rhythm: Tachycardia present.  Pulmonary:     Effort: Pulmonary effort is normal.  Abdominal:     Comments: Deferred  Genitourinary:    Comments: Deferred Musculoskeletal:        General: Normal range of motion.     Cervical back: Normal range of motion.  Skin:    General: Skin is warm.  Neurological:     General: No focal deficit present.     Mental Status: She is alert and oriented to person, place, and time.  Psychiatric:        Behavior: Behavior normal.     Comments: Smiling unnecessarily    Review of Systems  Constitutional:  Negative for chills and fever.  HENT:  Negative for sore throat.   Eyes: Negative.   Respiratory:  Negative for cough, shortness of breath and wheezing.   Cardiovascular:  Negative for chest pain and palpitations.        Blood pressure 126/98, pulse 104.  Patient is asymptomatic, nursing staff to recheck vital signs.  Gastrointestinal:  Negative for abdominal pain, heartburn, nausea and vomiting.  Genitourinary: Negative.   Musculoskeletal: Negative.  Negative for falls and myalgias.  Skin:  Negative for itching and rash.  Neurological:  Negative for dizziness, tingling, tremors and headaches.  Endo/Heme/Allergies:        See allergy listing  Psychiatric/Behavioral:  Positive for depression and hallucinations. Negative for memory loss, substance abuse and suicidal ideas. The patient is nervous/anxious and has insomnia.    Blood pressure 105/64, pulse 74, temperature 98.7 F (37.1 C), temperature source Oral, resp. rate 18, height 5\' 8"  (1.727 m), weight 108.6 kg, SpO2 100%. Body mass index is 36.4 kg/m.  Treatment Plan Summary: Daily contact with patient to assess and evaluate symptoms and progress in treatment and Medication management  Primary Diagnosis: Schizoaffective disorder, bipolar type (HCC)   Plan: Medications: -Start Risperdal 1 mg nightly for mood stabilization & psychosis  -Given Abilify Maintena 400 mg IM x 1 dose on 7/16 -Continue Ativan 1 mg BID x 2 days for anxiety, then stopped -Continue Abilify to 20 mg p.o. daily for psychosis and mood stabilization -Continue trazodone 150 mg p.o. nightly for insomnia  -Continue hydroxyzine 50 mg tablet p.o. 3 times daily as needed anxiety -Continue simvastatin 10 mg p.o. at 1800 daily for hyperlipidemia   Agitation protocol: Benadryl capsule 50 mg p.o. or IM 3 times daily as needed agitation   Haldol tablets 5 mg po IM 3 times daily as needed agitation   Lorazepam tablet 2 mg p.o. or IM 3 times daily as needed agitation     Other PRN Medications -Acetaminophen 650 mg every 6 as needed/mild pain -Maalox 30 mL oral every 4 as  needed/digestion -Magnesium hydroxide 30 mL daily as needed/mild constipation   -- The  risks/benefits/side-effects/alternatives to this medication were discussed in detail with the patient and time was given for questions. The patient consents to medication trial.              -- Encouraged patient to participate  in unit milieu and in scheduled group therapies    Safety and Monitoring: Voluntary admission to inpatient psychiatric unit for safety, stabilization and treatment Daily contact with patient to assess and evaluate symptoms and progress in treatment Patient's case to be discussed in multi-disciplinary team meeting Observation Level : q15 minute checks Vital signs: q12 hours Precautions: suicide, but pt currently verbally contracts for safety on unit    Discharge Planning: Social work and case management to assist with discharge planning and identification of hospital follow-up needs prior to discharge Estimated LOS: 5-7 days Discharge Concerns: Need to establish a safety plan; Medication compliance and effectiveness Discharge Goals: Return home with outpatient referrals for mental health follow-up including medication management/psychotherapy.   Long Term Goal(s): Improvement in symptoms so as ready for discharge   Short Term Goals: Ability to identify changes in lifestyle to reduce recurrence of condition will improve, Ability to verbalize feelings will improve, Ability to disclose and discuss suicidal ideas, Ability to demonstrate self-control will improve, Ability to identify and develop effective coping behaviors will improve, Ability to maintain clinical measurements within normal limits will improve, Compliance with prescribed medications will improve, and Ability to identify triggers associated with substance abuse/mental health issues will improve   I certify that inpatient services furnished can reasonably be expected to improve the patient's condition.    Starleen Blue, NP 12/19/2022, 3:14 PM Patient ID: Barbara George, female   DOB: [birth year],  27 y.o.    MRN: [id number] Patient ID: Barbara George, female   DOB: 05-04-1996, 27 y.o.   MRN: [id number] Patient ID: Barbara George, female   DOB: 10-Dec-1995, 27 y.o.   MRN: [id number]

## 2022-12-19 NOTE — Progress Notes (Signed)
Patient appeared  tearful, distressed,and visibly agitated expressing feelings of frustration and anxiety related to inability to sleep or relax. Patient report that she has been unable to sleep for several days.  Offered support and reassurance,  Administered prescribed PRN medication for agitation Encouraged patient to Voice any additional concerns. Will continue to monitor and follow plan of care as ordered. Q 15 minutes safety check in progress.

## 2022-12-19 NOTE — Group Note (Signed)
Recreation Therapy Group Note   Group Topic:Self-Esteem  Group Date: 12/19/2022 Start Time: 1040 End Time: 1115 Facilitators: Cainan Trull-McCall, LRT,CTRS Location: [address] Dayroom   Goal Area(s) Addresses:  Patient will be able to identify the importance of healthy self esteem. Patient will successfully share benefits of healthy self esteem. Patient will identify how positive self esteem can benefit them post d/c.     Group Description:  Patient and LRT discussed the importance of self esteem. Patients and LRT also discussed what affects the way a person feels about themselves. Pt were to pick one of the blank faces. Patients would then draw on the blank face how they see themselves. Patients were to draw the characteristics on the face. Patients could also use words and other pictures to express their feelings. Patients were given colored pencils, markers and pencils to complete the assignment.    Affect/Mood: Appropriate   Participation Level: Active   Participation Quality: Independent   Behavior: Appropriate and Interactive    Speech/Thought Process: Relevant   Insight: Moderate   Judgement: Moderate   Modes of Intervention: Art and Music   Patient Response to Interventions:  Attentive and Receptive   Education Outcome:  Acknowledges education   Clinical Observations/Individualized Feedback: Pt was attentive and relevant during group. Pt was able to share about herself as well as move along to the music. Pt described herself as being kind hearted, thankful and loves to talk.     Plan: Continue to engage patient in RT group sessions 2-3x/week.   Barbara George, LRT,CTRS 12/19/2022 1:25 PM

## 2022-12-19 NOTE — Group Note (Signed)
Date:  12/19/2022 Time:  9:34 AM  Group Topic/Focus:  Goals Group:   The focus of this group is to help patients establish daily goals to achieve during treatment and discuss how the patient can incorporate goal setting into their daily lives to aide in recovery.    Participation Level:  Active  Participation Quality:  Appropriate  Affect:  Appropriate  Cognitive:  Disorganized  Insight: Lacking  Engagement in Group:  Lacking  Modes of Intervention:  Education  Additional Comments:  Pt stated that her goal was to speak with the social worker about her discharge.   Donell Beers 12/19/2022, 9:34 AM

## 2022-12-20 DIAGNOSIS — F250 Schizoaffective disorder, bipolar type: Secondary | ICD-10-CM | POA: Diagnosis not present

## 2022-12-20 MED ORDER — ONDANSETRON 4 MG PO TBDP: 4.0000 mg | ORAL_TABLET | Freq: Three times a day (TID) | ORAL | Status: DC | PRN

## 2022-12-20 NOTE — Progress Notes (Signed)
[facility] MD Progress Note  12/20/2022 2:00 PM Barbara George  MRN:  [id number] Principal Problem: Schizoaffective disorder, bipolar type (HCC) Diagnosis: Principal Problem:   Schizoaffective disorder, bipolar type (HCC)  Reason for admission: Barbara George is a 27 y.o. [place]- American female with prior psychiatric history significant for schizoaffective disorder bipolar type who presents voluntarily to [facility] from [facility] after stabilization for worsening re-occurrence of auditory hallucinations.   Yesterday the psychiatry team made the following recommendations:  -Continue Risperdal 1 mg nightly for mood stabilization & psychosis  -Given Abilify Maintena 400 mg IM x 1 dose on 7/16 -Continue Ativan 1 mg BID x 2 days for anxiety, then stopped -Continue Abilify to 20 mg p.o. daily for psychosis and mood stabilization -Continue trazodone 150 mg p.o. nightly for insomnia  -Continue hydroxyzine 50 mg tablet p.o. 3 times daily as needed anxiety -Continue simvastatin 10 mg p.o. at 1800 daily for hyperlipidemia   On assessment today, the pt reports that her mood is euthymic, improved since admission, and stable. Denies feeling down, depressed, or sad.  Reports that anxiety symptoms are at manageable level.  Sleep is stable, nursing staff report patient sleeping 6.5 hours last night. Appetite is stable.  Concentration is without complaint.  Energy level is adequate. Denies having any suicidal thoughts. Denies having any suicidal intent and plan.  Denies having any HI.  Denies having psychotic symptoms.  No TD/EPS type symptoms found on assessment, and pt denies any feelings of stiffness. AIMS: 0.  Denies having side effects to current psychiatric medications.  However, reported vomiting this morning due to missing her husband and wants to be discharged to home, to be with the husband.  Further added, "I have been here for 9 days, and it is not good  to separate husband and wife for a long time according to my religious culture."  When asked if she feels pregnant, responded, " I do not know, you are the doctor and can get it tested."  Last pregnancy test on 07/06/2022 was negative. Patient requested for this provider to call the husband to come and take her home.  Expected discharge date 12/21/2022.  Discussed discharge planning: How to identify the signs of impending crisis, use of internal coping strategies, reaching out to friends and family can help  navigate a crisis, and a list of mental health professionals and agencies to call.  Total Time spent with patient: 45 minutes  Past Psychiatric History: Previous Psych Diagnoses:  History of schizoaffective disorder bipolar type Prior inpatient treatment: Multiple inpatient psychiatric treatment at [facility] [place], [place] in [place], [place] behavioral health patient does not remember the dates of the year.  Patient chart review, patient was admitted in March 2018 at [facility], in April 2018 patient was admitted at [facility] behavioral [facility], in May 2020 patient was admitted at [facility] behavioral health, and in September 2021 patient was admitted and treated at [facility]. Current/prior outpatient treatment: Receiving medication management at [facility] behavioral health urgent care denies Prior rehab hx: Denies Psychotherapy hx: Yes History of suicide: Denies history of suicide due to religious and cultural reasons History of homicide or aggression: Denies Psychiatric medication history: Patient has been on trial Zyprexa and Abilify Psychiatric medication compliance history: Noncompliance Neuromodulation history: Denies Current Psychiatrist: Denies Current therapist: Denies  Past Medical History:  Past Medical History:  Diagnosis Date   Bipolar 1 disorder (HCC)    Depression  Diabetes mellitus without complication (HCC)     Type 2 (Pre)   Schizophrenia (HCC)    History reviewed. No pertinent surgical history. Family History:  Family History  Problem Relation Age of Onset   Hypertension Father    Family Psychiatric  History: See H&P  Social History:  Social History   Substance and Sexual Activity  Alcohol Use No   Alcohol/week: 0.0 standard drinks of alcohol     Social History   Substance and Sexual Activity  Drug Use No    Social History   Socioeconomic History   Marital status: Married    Spouse name: Amadou   Number of children: Not on file   Years of education: Not on file   Highest education level: Associate degree: occupational, Scientist, product/process development, or vocational program  Occupational History   Occupation: unemployed  Tobacco Use   Smoking status: Never   Smokeless tobacco: Never  Vaping Use   Vaping status: Never Used  Substance and Sexual Activity   Alcohol use: No    Alcohol/week: 0.0 standard drinks of alcohol   Drug use: No   Sexual activity: Yes    Birth control/protection: None  Other Topics Concern   Not on file  Social History Narrative   ** Merged History Encounter **       Social Determinants of Health   Financial Resource Strain: Not on file  Food Insecurity: No Food Insecurity (12/11/2022)   Hunger Vital Sign    Worried About Running Out of Food in the Last Year: Never true    Ran Out of Food in the Last Year: Never true  Transportation Needs: No Transportation Needs (12/11/2022)   PRAPARE - Administrator, Civil Service (Medical): No    Lack of Transportation (Non-Medical): No  Physical Activity: Inactive (04/03/2019)   Exercise Vital Sign    Days of Exercise per Week: 0 days    Minutes of Exercise per Session: 0 min  Stress: Not on file  Social Connections: Socially Integrated (04/03/2019)   Social Connection and Isolation Panel [NHANES]    Frequency of Communication with Friends and Family: More than three times a week    Frequency of Social  Gatherings with Friends and Family: Once a week    Attends Religious Services: More than 4 times per year    Active Member of [employer] or Organizations: Yes    Attends Engineer, structural: Not on file    Marital Status: Married   Additional Social History:    Sleep: Good  Appetite:  Good  Current Medications: Current Facility-Administered Medications  Medication Dose Route Frequency Provider Last Rate Last Admin   acetaminophen (TYLENOL) tablet 650 mg  650 mg Oral Q6H PRN Peterson Ao, MD   650 mg at 12/17/22 1352   alum & mag hydroxide-simeth (MAALOX/MYLANTA) 200-200-20 MG/5ML suspension 30 mL  30 mL Oral Q4H PRN Peterson Ao, MD   30 mL at 12/16/22 2213   ARIPiprazole (ABILIFY) tablet 20 mg  20 mg Oral Daily Nkwenti, Doris, NP   20 mg at 12/20/22 0744   ARIPiprazole ER (ABILIFY MAINTENA) injection 400 mg  400 mg Intramuscular Q28 days Starleen Blue, NP   400 mg at 12/18/22 1645   diphenhydrAMINE (BENADRYL) capsule 50 mg  50 mg Oral TID PRN Peterson Ao, MD   50 mg at 12/19/22 2350   Or   diphenhydrAMINE (BENADRYL) injection 50 mg  50 mg Intramuscular TID PRN Peterson Ao, MD  haloperidol (HALDOL) tablet 5 mg  5 mg Oral TID PRN Peterson Ao, MD   5 mg at 12/19/22 2350   Or   haloperidol lactate (HALDOL) injection 5 mg  5 mg Intramuscular TID PRN Peterson Ao, MD       hydrOXYzine (ATARAX) tablet 50 mg  50 mg Oral TID PRN Starleen Blue, NP   50 mg at 12/19/22 2046   LORazepam (ATIVAN) tablet 2 mg  2 mg Oral TID PRN Peterson Ao, MD   2 mg at 12/19/22 2350   Or   LORazepam (ATIVAN) injection 2 mg  2 mg Intramuscular TID PRN Peterson Ao, MD       magnesium hydroxide (MILK OF MAGNESIA) suspension 30 mL  30 mL Oral Daily PRN Peterson Ao, MD       ondansetron (ZOFRAN-ODT) disintegrating tablet 4 mg  4 mg Oral Q8H PRN Massengill, Harrold Donath, MD       risperiDONE (RISPERDAL M-TABS) disintegrating tablet 1 mg  1 mg Oral QHS Massengill, Nathan, MD   1 mg  at 12/19/22 2046   simvastatin (ZOCOR) tablet 10 mg  10 mg Oral q1800 Nezzie Manera, Jesusita Oka, FNP   10 mg at 12/19/22 1847   traZODone (DESYREL) tablet 150 mg  150 mg Oral QHS Starleen Blue, NP   150 mg at 12/19/22 2046   Lab Results: No results found for this or any previous visit (from the past 48 hour(s)).  Blood Alcohol level:  Lab Results  Component Value Date   ETH <10 12/11/2022   ETH <10 02/20/2020   Metabolic Disorder Labs: Lab Results  Component Value Date   HGBA1C 6.2 (H) 12/11/2022   MPG 131 12/11/2022   MPG 111.15 10/23/2018   Lab Results  Component Value Date   PROLACTIN 13.0 12/11/2022   PROLACTIN CANCELED 09/17/2022   Lab Results  Component Value Date   CHOL 228 (H) 12/11/2022   TRIG 122 12/11/2022   HDL 91 12/11/2022   CHOLHDL 2.5 12/11/2022   VLDL 24 12/11/2022   LDLCALC 113 (H) 12/11/2022   LDLCALC 95 10/23/2018   Physical Findings: AIMS:  , ,  ,  ,    CIWA:    COWS:     Musculoskeletal: Strength & Muscle Tone: within normal limits Gait & Station: normal Patient leans: N/A  Psychiatric Specialty Exam:  Presentation  General Appearance:  Appropriate for Environment; Casual; Fairly Groomed  Eye Contact: Good  Speech: Clear and Coherent; Normal Rate  Speech Volume: Normal  Handedness: Right  Mood and Affect  Mood: Anxious; Depressed  Affect: Congruent  Thought Process  Thought Processes: Coherent  Descriptions of Associations:Intact  Orientation:Full (Time, Place and Person)  Thought Content:Logical; Rumination  History of Schizophrenia/Schizoaffective disorder:Yes  Duration of Psychotic Symptoms:Greater than six months  Hallucinations:Hallucinations: None Description of Auditory Hallucinations: Denies  Ideas of Reference:None  Suicidal Thoughts:Suicidal Thoughts: No  Homicidal Thoughts:Homicidal Thoughts: No  Sensorium  Memory: Immediate Good; Recent Good  Judgment: Fair  Insight: Shallow  Executive  Functions  Concentration: Fair  Attention Span: Fair  Recall: [employer] of Knowledge: Fair  Language: Fair  Psychomotor Activity  Psychomotor Activity: Psychomotor Activity: Normal  Assets  Assets: Communication Skills; Desire for Improvement; Resilience; Social Support; Housing  Sleep  Sleep: Sleep: Good Number of Hours of Sleep: 6.5  Physical Exam: Physical Exam Vitals and nursing note reviewed.  Constitutional:      Appearance: She is obese.  HENT:     Head: Normocephalic.  Nose: Nose normal.     Mouth/Throat:     Mouth: Mucous membranes are moist.  Eyes:     Extraocular Movements: Extraocular movements intact.     Pupils: Pupils are equal, round, and reactive to light.  Cardiovascular:     Rate and Rhythm: Tachycardia present.  Pulmonary:     Effort: Pulmonary effort is normal.  Abdominal:     Comments: Deferred  Genitourinary:    Comments: Deferred Musculoskeletal:        General: Normal range of motion.     Cervical back: Normal range of motion.  Skin:    General: Skin is warm.  Neurological:     General: No focal deficit present.     Mental Status: She is alert and oriented to person, place, and time.  Psychiatric:        Behavior: Behavior normal.     Comments: Smiling unnecessarily    Review of Systems  Constitutional:  Negative for chills and fever.  HENT:  Negative for sore throat.   Eyes: Negative.   Respiratory:  Negative for cough, shortness of breath and wheezing.   Cardiovascular:  Negative for chest pain and palpitations.       Blood pressure 126/98, pulse 104.  Patient is asymptomatic, nursing staff to recheck vital signs.  Gastrointestinal:  Negative for abdominal pain, heartburn, nausea and vomiting.  Genitourinary: Negative.   Musculoskeletal: Negative.  Negative for falls and myalgias.  Skin:  Negative for itching and rash.  Neurological:  Negative for dizziness, tingling, tremors and headaches.  Endo/Heme/Allergies:         See allergy listing  Psychiatric/Behavioral:  Positive for depression. Negative for memory loss, substance abuse and suicidal ideas. The patient is nervous/anxious and has insomnia.    Blood pressure 104/63, pulse 80, temperature 98.6 F (37 C), temperature source Oral, resp. rate 18, height 5\' 8"  (1.727 m), weight 108.6 kg, SpO2 100%. Body mass index is 36.4 kg/m.  Treatment Plan Summary: Daily contact with patient to assess and evaluate symptoms and progress in treatment and Medication management  Primary Diagnosis: Schizoaffective disorder, bipolar type (HCC)   Plan: Medications: -Continue Risperdal 1 mg nightly for mood stabilization & psychosis  -Given Abilify Maintena 400 mg IM x 1 dose on 7/16 -Continue Ativan 1 mg BID x 2 days for anxiety, then stopped -Continue Abilify to 20 mg p.o. daily for psychosis and mood stabilization -Continue trazodone 150 mg p.o. nightly for insomnia  -Continue hydroxyzine 50 mg tablet p.o. 3 times daily as needed anxiety -Continue simvastatin 10 mg p.o. at 1800 daily for hyperlipidemia   Agitation protocol: Benadryl capsule 50 mg p.o. or IM 3 times daily as needed agitation   Haldol tablets 5 mg po IM 3 times daily as needed agitation   Lorazepam tablet 2 mg p.o. or IM 3 times daily as needed agitation     Other PRN Medications -Acetaminophen 650 mg every 6 as needed/mild pain -Maalox 30 mL oral every 4 as needed/digestion -Magnesium hydroxide 30 mL daily as needed/mild constipation   -- The risks/benefits/side-effects/alternatives to this medication were discussed in detail with the patient and time was given for questions. The patient consents to medication trial.              -- Encouraged patient to participate  in unit milieu and in scheduled group therapies    Safety and Monitoring: Voluntary admission to inpatient psychiatric unit for safety, stabilization and treatment Daily contact with patient to assess and  evaluate  symptoms and progress in treatment Patient's case to be discussed in multi-disciplinary team meeting Observation Level : q15 minute checks Vital signs: q12 hours Precautions: suicide, but pt currently verbally contracts for safety on unit    Discharge Planning: Social work and case management to assist with discharge planning and identification of hospital follow-up needs prior to discharge Estimated LOS: 5-7 days Discharge Concerns: Need to establish a safety plan; Medication compliance and effectiveness Discharge Goals: Return home with outpatient referrals for mental health follow-up including medication management/psychotherapy.   Long Term Goal(s): Improvement in symptoms so as ready for discharge   Short Term Goals: Ability to identify changes in lifestyle to reduce recurrence of condition will improve, Ability to verbalize feelings will improve, Ability to disclose and discuss suicidal ideas, Ability to demonstrate self-control will improve, Ability to identify and develop effective coping behaviors will improve, Ability to maintain clinical measurements within normal limits will improve, Compliance with prescribed medications will improve, and Ability to identify triggers associated with substance abuse/mental health issues will improve   I certify that inpatient services furnished can reasonably be expected to improve the patient's condition.    Cecilie Lowers, FNP 12/20/2022, 2:00 PM Patient ID: Barbara George, female   DOB: 05/26/1996, 27 y.o.    MRN: [id number] Patient ID: Barbara George, female   DOB: 10-May-1996, 27 y.o.   MRN: [id number] Patient ID: Barbara George, female   DOB: [birth year], 27 y.o.   MRN: [id number] Patient ID: Barbara George, female   DOB: [birth year], 27 y.o.   MRN: [id number]

## 2022-12-20 NOTE — [facility] Group Notes (Signed)
[facility] Group Notes:  (Nursing/MHT/Case Management/Adjunct)  Date:  12/20/2022  Time:  2015  Type of Therapy:   wrap up group  Participation Level:  Active  Participation Quality:  Appropriate, Attentive, Sharing, and Supportive  Affect:  Appropriate  Cognitive:  Alert  Insight:  Improving  Engagement in Group:  Engaged  Modes of Intervention:  Clarification, Education, and Socialization  Summary of Progress/Problems: Positive thinking and self-care were discussed.   Barbara George 12/20/2022, 9:52 PM

## 2022-12-20 NOTE — Progress Notes (Addendum)
Pt is agitated and pacing the halls stating that she needs to go home and was supposed to discharge today. Pt stated, "Give me something now, or I'm going to fuck this place up!" Pt not receptive to redirection, being disruptive to the milieu, and talking loudly at staff. Pt was offered PRN for agitation. Pt followed nurse to medication window and banged on the door stating, "I need to go home to my husband!" As Clinical research associate prepared the medication pt walked away from the med window and started using the telephone. Pt agitated and speaking loudly on the phone. Pt stated she would take the medication later. Staff will continue to monitor. Q25min safety checks in place. Pt remains safe on the unit.  Pt refused PRN for agitation.

## 2022-12-20 NOTE — Group Note (Signed)
Recreation Therapy Group Note   Group Topic:Problem Solving  Group Date: 12/20/2022 Start Time: 1025 End Time: 1052 Facilitators: Jannis Atkins-McCall, LRT,CTRS Location: 500 Hall Dayroom   Goal Area(s) Addresses:  Patient will effectively work with peer towards shared goal.  Patient will identify skills used to make activity successful.  Patient will identify how skills used during activity can be used to reach post d/c goals.   Group Description: Straw Bridge. In teams of 3-5, patients were given 15 plastic drinking straws and an equal length of masking tape. Using the materials provided, patients were instructed to build a free standing bridge-like structure to suspend an everyday item (ex: puzzle box) off of the floor or table surface. All materials were required to be used by the team in their design. LRT facilitated post-activity discussion reviewing team process. Patients were encouraged to reflect how the skills used in this activity can be generalized to daily life post discharge.   Affect/Mood: Appropriate   Participation Level: Moderate   Participation Quality: Independent   Behavior: Appropriate   Speech/Thought Process: Relevant   Insight: Fair   Judgement: Fair    Modes of Intervention: STEM Activity   Patient Response to Interventions:  Engaged   Education Outcome:  In group clarification offered    Clinical Observations/Individualized Feedback: Pt was pleasant and worked well with peer in creating their bridge. Pt was called out of group to meet with NP and did not return.     Plan: Continue to engage patient in RT group sessions 2-3x/week.   Remedy Corporan-McCall, LRT,CTRS 12/20/2022 12:35 PM

## 2022-12-20 NOTE — Progress Notes (Signed)
   12/20/22 0615  15 Minute Checks  Location Hallway  Visual Appearance Calm  Behavior Composed  Sleep (Behavioral Health Patients Only)  Calculate sleep? (Click Yes once per 24 hr at 0600 safety check) Yes  Documented sleep last 24 hours 6.5

## 2022-12-20 NOTE — Group Note (Signed)
Occupational Therapy Group Note  Group Topic:Coping Skills  Group Date: 12/20/2022 Start Time: 1430 End Time: 1500 Facilitators: Ted Mcalpine, OT   Group Description: Group encouraged increased engagement and participation through discussion and activity focused on "Coping Ahead." Patients were split up into teams and selected a card from a stack of positive coping strategies. Patients were instructed to act out/charade the coping skill for other peers to guess and receive points for their team. Discussion followed with a focus on identifying additional positive coping strategies and patients shared how they were going to cope ahead over the weekend while continuing hospitalization stay.  Therapeutic Goal(s): Identify positive vs negative coping strategies. Identify coping skills to be used during hospitalization vs coping skills outside of hospital/at home Increase participation in therapeutic group environment and promote engagement in treatment   Participation Level: Minimal   Participation Quality: Minimal Cues   Behavior: Calm   Speech/Thought Process: Loose association    Affect/Mood: Flat   Insight: Limited   Judgement: Limited      Modes of Intervention: Education  Patient Response to Interventions:  Disengaged   Plan: Continue to engage patient in OT groups 2 - 3x/week.  01/17/2023  Ted Mcalpine, OT Kerrin Champagne, OT

## 2022-12-20 NOTE — Group Note (Signed)
Occupational Therapy Group Note  Group Topic:Coping Skills  Group Date: 12/20/2022 Start Time: 1430 End Time: 1500 Facilitators: Ted Mcalpine, OT   Group Description: Group encouraged increased engagement and participation through discussion and activity focused on "Coping Ahead." Patients were split up into teams and selected a card from a stack of positive coping strategies. Patients were instructed to act out/charade the coping skill for other peers to guess and receive points for their team. Discussion followed with a focus on identifying additional positive coping strategies and patients shared how they were going to cope ahead over the weekend while continuing hospitalization stay.  Therapeutic Goal(s): Identify positive vs negative coping strategies. Identify coping skills to be used during hospitalization vs coping skills outside of hospital/at home Increase participation in therapeutic group environment and promote engagement in treatment   Participation Level: Minimal   Participation Quality: Minimal Cues   Behavior: Disinterested   Speech/Thought Process: Loose association    Affect/Mood: Flat   Insight: Limited   Judgement: Limited      Modes of Intervention: Education  Patient Response to Interventions:  Disengaged   Plan: Continue to engage patient in OT groups 2 - 3x/week.  12/20/2022  Ted Mcalpine, OT Kerrin Champagne, OT

## 2022-12-20 NOTE — Group Note (Signed)
Date:  12/20/2022 Time:  9:44 AM  Group Topic/Focus:  Goals Group:   The focus of this group is to help patients establish daily goals to achieve during treatment and discuss how the patient can incorporate goal setting into their daily lives to aide in recovery.    Participation Level:  Active  Participation Quality:  Appropriate  Affect:  Appropriate  Cognitive:  Disorganized  Insight: Limited  Engagement in Group:  Lacking  Modes of Intervention:  Discussion  Additional Comments:  Pt stated that her goal was to leave. Pt could not explain how she would accomplish this goal.  Donell Beers 12/20/2022, 9:44 AM

## 2022-12-21 ENCOUNTER — Other Ambulatory Visit: Payer: Self-pay

## 2022-12-21 DIAGNOSIS — F250 Schizoaffective disorder, bipolar type: Secondary | ICD-10-CM | POA: Diagnosis not present

## 2022-12-21 MED ORDER — TRAZODONE HCL 150 MG PO TABS: 150.0000 mg | ORAL_TABLET | Freq: Every day | ORAL | 0 refills | Status: DC

## 2022-12-21 MED ORDER — SIMVASTATIN 10 MG PO TABS: 10.0000 mg | ORAL_TABLET | Freq: Every day | ORAL | 0 refills | Status: AC

## 2022-12-21 MED ORDER — RISPERIDONE 1 MG PO TBDP: 1.0000 mg | ORAL_TABLET | Freq: Every day | ORAL | 0 refills | Status: DC

## 2022-12-21 MED ORDER — ARIPIPRAZOLE ER 400 MG IM SRER: 400.0000 mg | INTRAMUSCULAR | 0 refills | Status: DC

## 2022-12-21 MED ORDER — HYDROXYZINE HCL 50 MG PO TABS: 50.0000 mg | ORAL_TABLET | Freq: Three times a day (TID) | ORAL | 0 refills | Status: AC | PRN

## 2022-12-21 MED ORDER — ARIPIPRAZOLE 10 MG PO TABS: 10.0000 mg | ORAL_TABLET | ORAL | 0 refills | Status: DC

## 2022-12-21 NOTE — [facility] Suicide Risk Assessment (Signed)
[facility] INPATIENT:  Family/Significant Other Suicide Prevention Education  Suicide Prevention Education:  Contact Attempts: 12-11-2022, Adamidou Larita Fife [phone] has been identified by the patient as the family member/significant other with whom the patient will be residing, and identified as the person(s) who will aid the patient in the event of a mental health crisis.  With written consent from the patient, two attempts were made to provide suicide prevention education, prior to and/or following the patient's discharge.  We were unsuccessful in providing suicide prevention education.  A suicide education pamphlet was given to the patient to share with family/significant other.  Date and time of first attempt:12/20/2022 1133 a Date and time of second attempt:12/21/2022/ 11:10  Lestine Rahe S Shakinah Navis 12/21/2022, 11:06 AM

## 2022-12-21 NOTE — Plan of Care (Signed)
Patient was able to engage with peers and staff in a pro-social manner within two recreation therapy group sessions. Patient would take the initiative to engage socially with peers and staff during group sessions.   Mariachristina Holle-McCall, LRT,CTRS

## 2022-12-21 NOTE — Progress Notes (Signed)
   12/21/22 0851  Psych Admission Type (Psych Patients Only)  Admission Status Voluntary  Psychosocial Assessment  Patient Complaints None  Eye Contact Fair  Facial Expression Animated  Affect Appropriate to circumstance  Speech Logical/coherent  Interaction Assertive  Motor Activity Other (Comment) (unremarkable)  Appearance/Hygiene Unremarkable  Behavior Characteristics Cooperative  Mood Pleasant  Thought Process  Coherency WDL  Content Preoccupation  Delusions None reported or observed  Perception WDL  Hallucination None reported or observed  Judgment Impaired  Confusion None  Danger to Self  Current suicidal ideation? Denies  Agreement Not to Harm Self Yes  Description of Agreement verbal  Danger to Others  Danger to Others None reported or observed

## 2022-12-21 NOTE — Progress Notes (Signed)
Recreation Therapy Notes  INPATIENT RECREATION TR PLAN  Patient Details Name: Barbara George MRN: [id number] DOB: March 08, 1996 Today's Date: 12/21/2022  Rec Therapy Plan Is patient appropriate for Therapeutic Recreation?: Yes Treatment times per week: about 3 days Estimated Length of Stay: 5-7 days TR Treatment/Interventions: Group participation (Comment)  Discharge Criteria Pt will be discharged from therapy if:: Discharged Treatment plan/goals/alternatives discussed and agreed upon by:: Patient/family  Discharge Summary Short term goals set: See patient care plan Short term goals met: Complete Progress toward goals comments: Groups attended Which groups?: Self-esteem, Wellness, Communication, Other (Comment) (Problem Solving; Decision Making) Reason goals not met: None Therapeutic equipment acquired: N/A Reason patient discharged from therapy: Discharge from hospital Pt/family agrees with progress & goals achieved: Yes Date patient discharged from therapy: 12/21/22    Jamiah Recore-McCall, LRT,CTRS Teaghan Formica A Spurgeon Gancarz-McCall 12/21/2022, 1:41 PM

## 2022-12-21 NOTE — Progress Notes (Signed)
  [facility] Adult Case Management Discharge Plan :  Will you be returning to the same living situation after discharge:  Yes,  Pt will be returning to her home At discharge, do you have transportation home?: Yes,  Adamidou Adamadou [phone] (spouse) Do you have the ability to pay for your medications: Yes,  Insured  Release of information consent forms completed and in the chart;  Patient's signature needed at discharge.  Patient to Follow up at:  Follow-up Information     [facility] Go on 01/09/2023.   Why: You have an appointment for medication management services on 01/09/23 at 2:00 pm.   This appointment will be held in person. Contact information: [address] [place] [address] [phone]         [facility] Go to.   Specialty: Behavioral Health Why: Please go to this provider for therapy services, during walk in days Monday through Friday, arrive by 7:00 am. Contact information: [address] [place] [address] [phone]                Next level of care provider has access to [facility] Link:yes  Safety Planning and Suicide Prevention discussed: Yes,  with patient     Has patient been referred to the Quitline?: Patient does not use tobacco/nicotine products  Patient has been referred for addiction treatment: No known substance use disorder. Patient to continue working towards treatment goals after discharge. Patient no longer meets criteria for inpatient criteria per attending physician. Continue taking medications as prescribed, nursing to provide instructions at discharge. Follow up with all scheduled appointments.   Sallie Maker S Husayn Reim, LCSW 12/21/2022, 11:08 AM

## 2022-12-21 NOTE — Progress Notes (Signed)
Pt refuses to fill out suicide safety plan

## 2022-12-21 NOTE — Discharge Instructions (Addendum)
-  Follow-up with your outpatient psychiatric provider -instructions on appointment date, time, and address (location) are provided to you in discharge paperwork.  -Take your psychiatric medications as prescribed at discharge - instructions are provided to you in the discharge paperwork Taper off oral abilify as follows: take 2 tablets once daily for 5 days. Then decrease to taking 1 tablet once daily for 5 days. Then stop.   -Follow-up with outpatient primary care doctor and other specialists -for management of preventative medicine and any chronic medical disease.  -Recommend abstinence from alcohol, tobacco, and other illicit drug use at discharge.   -If your psychiatric symptoms recur, worsen, or if you have side effects to your psychiatric medications, call your outpatient psychiatric provider, 911, [phone] or go to the nearest emergency department.  -If suicidal thoughts occur, call your outpatient psychiatric provider, 911, [phone] or go to the nearest emergency department.  Naloxone (Narcan) can help reverse an overdose when given to the victim quickly.  [facility] offers free naloxone kits and instructions/training on its use.  Add naloxone to your first aid kit and you can help save a life.   Pick up your free kit at the following locations:   [place]:  [facility], [address] [place] [address] [phone]) [facility] [address] [place] [id number] [phone]) [facility] [address] [place] [address]  [place]: [facility] of [facility] [address] [place] [address] ([phone]) [facility] [address] [place] [address] [phone])

## 2022-12-21 NOTE — Progress Notes (Signed)
Pt discharged home alert and oriented x 4 and cooperative. Discharge instructions reviewed with pt and pt verbalized understanding of discharge instructions. Belongings returned to pt.

## 2022-12-21 NOTE — Progress Notes (Signed)

## 2022-12-21 NOTE — Progress Notes (Signed)
   12/21/22 0713  15 Minute Checks  Location Hallway  Visual Appearance Calm  Behavior Composed  Sleep (Behavioral Health Patients Only)  Calculate sleep? (Click Yes once per 24 hr at 0600 safety check) Yes  Documented sleep last 24 hours 8.25

## 2022-12-21 NOTE — [facility]HH Suicide Risk Assessment (Signed)
Suicide Risk Assessment  Discharge Assessment    [facility] Discharge Suicide Risk Assessment   Principal Problem: Schizoaffective disorder, bipolar type [facility]) Discharge Diagnoses: Principal Problem:   Schizoaffective disorder, bipolar type (HCC)  Reason for admission: Barbara George is a 27 y.o. [place]- American female with prior psychiatric history significant for schizoaffective disorder bipolar type who presents voluntarily to [facility] from [facility] after stabilization for worsening re-occurrence of auditory hallucinations.   Hospital Course: During the patient's hospitalization, patient had extensive initial psychiatric evaluation, and follow-up psychiatric evaluations every day. Psychiatric diagnoses provided upon initial assessment: Schizoaffective d/o, bipolar type.  Patient's psychiatric medications were adjusted on admission as follows:  Initiate Abilify 5 mg p.o. daily for mood stabilization Continue trazodone 50 mg p.o. nightly as needed for insomnia Continue hydroxyzine 25 mg tablet p.o. 3 times daily as needed anxiety Initiate Simvastatin 10 mg p.o. at 1800 daily for hyperlipidemia  During the hospitalization, other adjustments were made to the patient's psychiatric medication regimen. Pt presented with some mania, and  was given Ativan 1 mg BID x 2 days, then stopped. Medications at discharge are as follows:  -Continue Risperdal 1 mg nightly for mood stabilization & psychosis  -Abilify Maintena 400 mg IM x 1 dose every 28 days, next due on 01/15/2023. -Continue Abilify to 10 mg p.o. daily for psychosis and mood stabilization x 15 days, then stop. Taper this medication off as follows: Take 2 tablets once daily for 5 days. Then decrease to taking 1 tablet once daily for 5 days. Then stop.  -Continue trazodone 150 mg p.o. nightly for insomnia  -Continue hydroxyzine 50 mg tablet p.o. 3 times daily as needed anxiety -Continue simvastatin 10 mg p.o.  at 1800 daily for hyperlipidemia  Patient's care was discussed during the interdisciplinary team meeting every day during the hospitalization. The patient denies having side effects to prescribed psychiatric medication. Gradually, patient started adjusting to milieu. The patient was evaluated each day by a clinical provider to ascertain response to treatment. Improvement was noted by the patient's report of decreasing symptoms, improved sleep and appetite, affect, medication tolerance, behavior, and participation in unit programming.  Patient was asked each day to complete a self inventory noting mood, mental status, pain, new symptoms, anxiety and concerns.    Symptoms were reported as significantly decreased or resolved completely by discharge. On day of discharge, the patient reports that their mood is stable. The patient denied having suicidal thoughts for more than 48 hours prior to discharge.  Patient denies having homicidal thoughts.  Patient denies having auditory hallucinations.  Patient denies any visual hallucinations or other symptoms of psychosis. The patient was motivated to continue taking medication with a goal of continued improvement in mental health.   The patient reports their target psychiatric symptoms of depression, anxiety, psychosis responded well to the psychiatric medications, and the patient reports overall benefit other psychiatric hospitalization. Supportive psychotherapy was provided to the patient. The patient also participated in regular group therapy while hospitalized. Coping skills, problem solving as well as relaxation therapies were also part of the unit programming.  Labs were reviewed with the patient, and abnormal results were discussed with the patient. Hemoglobin A1C is 6.2 rendering pt a prediabetic. Educated on the need to f/u with her PCP. EKG repeated on 7/19, and with Qtc of 400 & NSR. There were difficulties uploading this EKG unto the patient's chart due to  technical difficulties across the system.  The patient is able to  verbalize their individual safety plan to this provider.  # It is recommended to the patient to continue psychiatric medications as prescribed, after discharge from the hospital.    # It is recommended to the patient to follow up with your outpatient psychiatric provider and PCP.  # It was discussed with the patient, the impact of alcohol, drugs, tobacco have been there overall psychiatric and medical wellbeing, and total abstinence from substance use was recommended the patient.ed.  # Prescriptions provided or sent directly to preferred pharmacy at discharge. Patient agreeable to plan. Given opportunity to ask questions. Appears to feel comfortable with discharge.    # In the event of worsening symptoms, the patient is instructed to call the crisis hotline, 911 and or go to the nearest ED for appropriate evaluation and treatment of symptoms. To follow-up with primary care provider for other medical issues, concerns and or health care needs  # Patient was discharged home with a plan to follow up as noted below.   Total Time spent with patient: 45 minutes  Musculoskeletal: Strength & Muscle Tone: within normal limits Gait & Station: normal Patient leans: N/A  Psychiatric Specialty Exam  Presentation  General Appearance:  Appropriate for Environment; Fairly Groomed  Eye Contact: Good  Speech: Clear and Coherent  Speech Volume: Normal  Handedness: Right   Mood and Affect  Mood: Euthymic  Duration of Depression Symptoms: Greater than two weeks  Affect: Congruent   Thought Process  Thought Processes: Coherent  Descriptions of Associations:Intact  Orientation:Full (Time, Place and Person)  Thought Content:Logical  History of Schizophrenia/Schizoaffective disorder:Yes  Duration of Psychotic Symptoms:Less than six months  Hallucinations:Hallucinations: None Description of Auditory  Hallucinations: Denies  Ideas of Reference:None  Suicidal Thoughts:Suicidal Thoughts: No  Homicidal Thoughts:Homicidal Thoughts: No   Sensorium  Memory: Immediate Good  Judgment: Fair  Insight: Fair   Art therapist  Concentration: Good  Attention Span: Good  Recall: Fair  Fund of Knowledge: Fair  Language: Fair   Psychomotor Activity  Psychomotor Activity: Psychomotor Activity: Normal   Assets  Assets: Housing; Health and safety inspector; Resilience; Social Support   Sleep  Sleep: Sleep: Good Number of Hours of Sleep: 6.5   Physical Exam: Physical Exam Constitutional:      Appearance: Normal appearance.  HENT:     Nose: Nose normal.     Mouth/Throat:     Pharynx: Oropharynx is clear.  Pulmonary:     Effort: Pulmonary effort is normal.  Musculoskeletal:     Cervical back: Normal range of motion.  Neurological:     Mental Status: She is alert and oriented to person, place, and time.    Review of Systems  Constitutional:  Negative for fever.  HENT:  Negative for hearing loss.   Eyes:  Negative for blurred vision.  Respiratory:  Negative for cough.   Cardiovascular:  Negative for chest pain.  Gastrointestinal:  Negative for heartburn.  Genitourinary:  Negative for dysuria.  Musculoskeletal:  Negative for myalgias.  Skin:  Negative for rash.  Neurological:  Negative for dizziness.  Psychiatric/Behavioral:  Positive for depression (Denies SI/HI/AVH, denies plan or intent to harm self or any one else outside of [facility] health). Negative for hallucinations, memory loss, substance abuse and suicidal ideas. The patient is nervous/anxious (REsolving) and has insomnia (Resolving).    Blood pressure 129/76, pulse 68, temperature 98.6 F (37 C), resp. rate 13, height 5\' 8"  (1.727 m), weight 108.6 kg, SpO2 98%. Body mass index is 36.4 kg/m.  Mental Status Per  Nursing Assessment::   On Admission:  NA  Demographic Factors:  Low  socioeconomic status and Unemployed  Loss Factors: NA  Historical Factors: NA  Risk Reduction Factors:   Sense of responsibility to family, Religious beliefs about death, Living with another person, especially a relative, Positive social support, and Positive therapeutic relationship  Continued Clinical Symptoms:  More than one psychiatric diagnosis Patient reports that her depressive symptoms have significantly subsided. She denies psychosis (denies AVH, denies delusional thoughts. Denies SI/HI/AVH, denies intent/plan to harm any one outside of [place] or to harm herself, and verbalizes readiness for discharge.  Cognitive Features That Contribute To Risk:  None    Suicide Risk:  Mild:  There are no identifiable suicide plans, no associated intent, mild dysphoria and related symptoms, good self-control (both objective and subjective assessment), few other risk factors, and identifiable protective factors, including available and accessible social support.    Follow-up Information     [facility] Go on 01/09/2023.   Why: You have an appointment for medication management services on 01/09/23 at 2:00 pm.   This appointment will be held in person. Contact information: [address] [place] [address] [phone]         [facility]. Go to.   Specialty: Behavioral Health Why: Please go to this provider for therapy services, during walk in days Monday through Friday, arrive by 7:00 am. Contact information: [address] [place] [address] [phone]                Starleen Blue, NP 12/21/2022, 2:10 PM

## 2022-12-21 NOTE — Group Note (Signed)
Recreation Therapy Group Note   Group Topic:Communication  Group Date: 12/21/2022 Start Time: 1035 End Time: 1100 Facilitators: Havanna Groner-McCall, LRT,CTRS Location: [address] Dayroom   Goal Area(s) Addresses:  Patient will effectively listen to complete activity.  Patient will identify communication skills used to make activity successful.  Patient will identify how skills used during activity can be used to reach post d/c goals.    Group Description: Geometric Drawings.  Three volunteers from the peer group will be shown an abstract picture with a particular arrangement of geometrical shapes.  Each round, one 'speaker' will describe the pattern, as accurately as possible without revealing the image to the group.  The remaining group members will listen and draw the picture to reflect how it is described to them. Patients with the role of 'listener' cannot ask clarifying questions but, may request that the speaker repeat a direction. Once the drawings are complete, the presenter will show the rest of the group the picture and compare how close each person came to drawing the picture. LRT will facilitate a post-activity discussion regarding effective communication and the importance of planning, listening, and asking for clarification in daily interactions with others.   Affect/Mood: Appropriate   Participation Level: Engaged   Participation Quality: Independent   Behavior: Cooperative   Speech/Thought Process: Coherent   Insight: Good   Judgement: Good   Modes of Intervention: Activity   Patient Response to Interventions:  Engaged   Education Outcome:  Acknowledges education   Clinical Observations/Individualized Feedback: Pt was appropriate. Pt presented one of the drawings. Pt was coherent for the most part but there were moments were her accent made it hard for peer to understand some of the instructions she was giving. In those instances, pt took time to slow down  were peer could understand. Pt was engaged during group.      Plan: Continue to engage patient in RT group sessions 2-3x/week.   Karmah Potocki-McCall, LRT,CTRS 12/21/2022 1:15 PM

## 2022-12-21 NOTE — Discharge Summary (Signed)
Physician Discharge Summary Note  Patient:  Barbara George is an 27 y.o., female MRN:  [id number] DOB:  1995/08/07 Patient phone:  [phone] (home)  Patient address:   [address]  [place] [address],  Total Time spent with patient: 1.5 hours  Date of Admission:  12/11/2022 Date of Discharge: 12/21/2022  Reason for Admission:  Barbara George is a 74 y.o. [place]- American female with prior psychiatric history significant for schizoaffective disorder bipolar type who presents voluntarily to [facility] from [facility] after stabilization for worsening re-occurrence of auditory hallucinations.   Principal Problem: Schizoaffective disorder, bipolar type [facility]) Discharge Diagnoses: Principal Problem:   Schizoaffective disorder, bipolar type (HCC)  Past Psychiatric History: See H & P  Past Medical History:  Past Medical History:  Diagnosis Date   Bipolar 1 disorder (HCC)    Depression    Diabetes mellitus without complication (HCC)    Type 2 (Pre)   Schizophrenia (HCC)    History reviewed. No pertinent surgical history. Family History:  Family History  Problem Relation Age of Onset   Hypertension Father    Family Psychiatric  History: See H & P Social History:  Social History   Substance and Sexual Activity  Alcohol Use No   Alcohol/week: 0.0 standard drinks of alcohol     Social History   Substance and Sexual Activity  Drug Use No    Social History   Socioeconomic History   Marital status: Married    Spouse name: Amadou   Number of children: Not on file   Years of education: Not on file   Highest education level: Associate degree: occupational, Scientist, product/process development, or vocational program  Occupational History   Occupation: unemployed  Tobacco Use   Smoking status: Never   Smokeless tobacco: Never  Vaping Use   Vaping status: Never Used  Substance and Sexual Activity   Alcohol use: No    Alcohol/week: 0.0  standard drinks of alcohol   Drug use: No   Sexual activity: Yes    Birth control/protection: None  Other Topics Concern   Not on file  Social History Narrative   ** Merged History Encounter **       Social Determinants of Health   Financial Resource Strain: Not on file  Food Insecurity: No Food Insecurity (12/11/2022)   Hunger Vital Sign    Worried About Running Out of Food in the Last Year: Never true    Ran Out of Food in the Last Year: Never true  Transportation Needs: No Transportation Needs (12/11/2022)   PRAPARE - Administrator, Civil Service (Medical): No    Lack of Transportation (Non-Medical): No  Physical Activity: Inactive (04/03/2019)   Exercise Vital Sign    Days of Exercise per Week: 0 days    Minutes of Exercise per Session: 0 min  Stress: Not on file  Social Connections: Socially Integrated (04/03/2019)   Social Connection and Isolation Panel [NHANES]    Frequency of Communication with Friends and Family: More than three times a week    Frequency of Social Gatherings with Friends and Family: Once a week    Attends Religious Services: More than 4 times per year    Active Member of [employer] or Organizations: Yes    Attends Banker Meetings: Not on file    Marital Status: Married   Hospital Course:   During the patient's hospitalization, patient had extensive initial psychiatric evaluation, and follow-up psychiatric  evaluations every day. Psychiatric diagnoses provided upon initial assessment: Schizoaffective d/o, bipolar type.   Patient's psychiatric medications were adjusted on admission as follows:  Initiate Abilify 5 mg p.o. daily for mood stabilization Continue trazodone 50 mg p.o. nightly as needed for insomnia Continue hydroxyzine 25 mg tablet p.o. 3 times daily as needed anxiety Initiate Simvastatin 10 mg p.o. at 1800 daily for hyperlipidemia   During the hospitalization, other adjustments were made to the patient's psychiatric  medication regimen. Pt presented with some mania, and  was given Ativan 1 mg BID x 2 days, then stopped. Medications at discharge are as follows:   -Continue Risperdal 1 mg nightly for mood stabilization & psychosis  -Abilify Maintena 400 mg IM x 1 dose every 28 days, next due on 01/15/2023. -Continue Abilify to 10 mg p.o. daily for psychosis and mood stabilization x 15 days, then stop. Taper this medication off as follows: Take 2 tablets once daily for 5 days. Then decrease to taking 1 tablet once daily for 5 days. Then stop.  -Continue trazodone 150 mg p.o. nightly for insomnia  -Continue hydroxyzine 50 mg tablet p.o. 3 times daily as needed anxiety -Continue simvastatin 10 mg p.o. at 1800 daily for hyperlipidemia   Patient's care was discussed during the interdisciplinary team meeting every day during the hospitalization. The patient denies having side effects to prescribed psychiatric medication. Gradually, patient started adjusting to milieu. The patient was evaluated each day by a clinical provider to ascertain response to treatment. Improvement was noted by the patient's report of decreasing symptoms, improved sleep and appetite, affect, medication tolerance, behavior, and participation in unit programming.  Patient was asked each day to complete a self inventory noting mood, mental status, pain, new symptoms, anxiety and concerns.     Symptoms were reported as significantly decreased or resolved completely by discharge. On day of discharge, the patient reports that their mood is stable. The patient denied having suicidal thoughts for more than 48 hours prior to discharge.  Patient denies having homicidal thoughts.  Patient denies having auditory hallucinations.  Patient denies any visual hallucinations or other symptoms of psychosis. The patient was motivated to continue taking medication with a goal of continued improvement in mental health.    The patient reports their target psychiatric  symptoms of depression, anxiety, psychosis responded well to the psychiatric medications, and the patient reports overall benefit other psychiatric hospitalization. Supportive psychotherapy was provided to the patient. The patient also participated in regular group therapy while hospitalized. Coping skills, problem solving as well as relaxation therapies were also part of the unit programming.   Labs were reviewed with the patient, and abnormal results were discussed with the patient. Hemoglobin A1C is 6.2 rendering pt a prediabetic. Educated on the need to f/u with her PCP. EKG repeated on 7/19, and with Qtc of 400 & NSR. There were difficulties uploading this EKG unto the patient's chart due to technical difficulties across the system.   The patient is able to verbalize their individual safety plan to this provider.   # It is recommended to the patient to continue psychiatric medications as prescribed, after discharge from the hospital.     # It is recommended to the patient to follow up with your outpatient psychiatric provider and PCP.   # It was discussed with the patient, the impact of alcohol, drugs, tobacco have been there overall psychiatric and medical wellbeing, and total abstinence from substance use was recommended the patient.ed.   # Prescriptions provided  or sent directly to preferred pharmacy at discharge. Patient agreeable to plan. Given opportunity to ask questions. Appears to feel comfortable with discharge.    # In the event of worsening symptoms, the patient is instructed to call the crisis hotline, 911 and or go to the nearest ED for appropriate evaluation and treatment of symptoms. To follow-up with primary care provider for other medical issues, concerns and or health care needs   # Patient was discharged home with a plan to follow up as noted below.    Total Time spent with patient: 45 minutes Physical Findings: AIMS: 0 CIWA:  n/a COWS:   n/a  Musculoskeletal: Strength & Muscle Tone: within normal limits Gait & Station: normal Patient leans: N/A  Psychiatric Specialty Exam:  Presentation  General Appearance:  Appropriate for Environment; Fairly Groomed  Eye Contact: Good  Speech: Clear and Coherent  Speech Volume: Normal  Handedness: Right   Mood and Affect  Mood: Euthymic  Affect: Congruent   Thought Process  Thought Processes: Coherent  Descriptions of Associations:Intact  Orientation:Full (Time, Place and Person)  Thought Content:Logical  History of Schizophrenia/Schizoaffective disorder:Yes  Duration of Psychotic Symptoms:Less than six months  Hallucinations:Hallucinations: None Description of Auditory Hallucinations: Denies  Ideas of Reference:None  Suicidal Thoughts:Suicidal Thoughts: No  Homicidal Thoughts:Homicidal Thoughts: No   Sensorium  Memory: Immediate Good  Judgment: Fair  Insight: Fair   Art therapist  Concentration: Good  Attention Span: Good  Recall: Fair  Fund of Knowledge: Fair  Language: Fair   Psychomotor Activity  Psychomotor Activity: Psychomotor Activity: Normal   Assets  Assets: Housing; Health and safety inspector; Resilience; Social Support   Sleep  Sleep: Sleep: Good Number of Hours of Sleep: 6.5    Physical Exam: Physical Exam Constitutional:      Appearance: Normal appearance.  HENT:     Head: Normocephalic.  Eyes:     Pupils: Pupils are equal, round, and reactive to light.  Pulmonary:     Effort: Pulmonary effort is normal.  Musculoskeletal:        General: Normal range of motion.     Cervical back: Normal range of motion.  Neurological:     General: No focal deficit present.     Mental Status: She is alert and oriented to person, place, and time.    Review of Systems  Constitutional:  Negative for fever.  HENT:  Negative for hearing loss.   Eyes:  Negative for blurred vision.   Respiratory:  Negative for cough.   Cardiovascular:  Negative for chest pain.  Gastrointestinal:  Negative for heartburn.  Genitourinary:  Negative for dysuria.  Musculoskeletal:  Negative for myalgias.  Skin:  Negative for rash.  Neurological:  Negative for dizziness.  Psychiatric/Behavioral:  Positive for depression (Improving). Negative for hallucinations, memory loss, substance abuse and suicidal ideas. The patient is nervous/anxious (Improving) and has insomnia (Improving).    Blood pressure 129/76, pulse 68, temperature 98.6 F (37 C), resp. rate 13, height 5\' 8"  (1.727 m), weight 108.6 kg, SpO2 98%. Body mass index is 36.4 kg/m.   Social History   Tobacco Use  Smoking Status Never  Smokeless Tobacco Never   Tobacco Cessation:  N/A, patient does not currently use tobacco products   Blood Alcohol level:  Lab Results  Component Value Date   ETH <10 12/11/2022   ETH <10 02/20/2020    Metabolic Disorder Labs:  Lab Results  Component Value Date   HGBA1C 6.2 (H) 12/11/2022   MPG 131 12/11/2022  MPG 111.15 10/23/2018   Lab Results  Component Value Date   PROLACTIN 13.0 12/11/2022   PROLACTIN CANCELED 09/17/2022   Lab Results  Component Value Date   CHOL 228 (H) 12/11/2022   TRIG 122 12/11/2022   HDL 91 12/11/2022   CHOLHDL 2.5 12/11/2022   VLDL 24 12/11/2022   LDLCALC 113 (H) 12/11/2022   LDLCALC 95 10/23/2018    See Psychiatric Specialty Exam and Suicide Risk Assessment completed by Attending Physician prior to discharge.  Discharge destination:  Home  Is patient on multiple antipsychotic therapies at discharge:  No   Has Patient had three or more failed trials of antipsychotic monotherapy by history:  No  Recommended Plan for Multiple Antipsychotic Therapies: Additional reason(s) for multiple antispychotic treatment:  Psychosis persisted with the use of Abilify alone, requiring the addition of Risperdal for treatment and stabilization. Outpatient  mental health provider to continue to monitor symptoms and taper down to one antipsychotic as symptoms continue to improve.   Discharge Instructions     Diet - low sodium heart healthy   Complete by: As directed    Increase activity slowly   Complete by: As directed       Allergies as of 12/21/2022       Reactions   Peanut-containing Drug Products Nausea And Vomiting   Pork-derived Products Rash, Other (See Comments)   Religious purposes        Medication List     STOP taking these medications    OLANZapine 15 MG tablet Commonly known as: ZYPREXA       TAKE these medications      Indication  ARIPiprazole 10 MG tablet Commonly known as: ABILIFY Take 1 tablet (10 mg total) by mouth as directed for 15 doses. Taper off oral abilify as follows: Take 2 tablets once daily for 5 days. Then decrease to taking 1 tablet once daily for 5 days. Then stop.  Indication: Schizophrenia, psychosis   ARIPiprazole ER 400 MG Srer injection Commonly known as: ABILIFY MAINTENA Inject 2 mLs (400 mg total) into the muscle every 28 (twenty-eight) days for 1 dose. Next dose is due on 01-15-23. Start taking on: January 15, 2023  Indication: mood stabilization   hydrOXYzine 50 MG tablet Commonly known as: ATARAX Take 1 tablet (50 mg total) by mouth 3 (three) times daily as needed for anxiety.  Indication: Feeling Anxious   risperiDONE 1 MG disintegrating tablet Commonly known as: RISPERDAL M-TABS Take 1 tablet (1 mg total) by mouth at bedtime.  Indication: Schizophrenia   simvastatin 10 MG tablet Commonly known as: ZOCOR Take 1 tablet (10 mg total) by mouth daily at 6 PM for 14 days.  Indication: High Amount of Fats in the Blood   traZODone 150 MG tablet Commonly known as: DESYREL Take 1 tablet (150 mg total) by mouth at bedtime.  Indication: Trouble Sleeping        Follow-up Information     [facility] Go on 01/09/2023.   Why: You have an appointment for medication  management services on 01/09/23 at 2:00 pm.   This appointment will be held in person. Contact information: [address] [place] [address] [phone]         [facility]. Go to.   Specialty: Behavioral Health Why: Please go to this provider for therapy services, during walk in days Monday through Friday, arrive by 7:00 am. Contact information: [address] [place] [address] [phone]  Signed: Starleen Blue, NP 12/21/2022, 4:57 PM

## 2022-12-27 ENCOUNTER — Encounter (HOSPITAL_COMMUNITY): Payer: Self-pay

## 2022-12-27 ENCOUNTER — Emergency Department (HOSPITAL_COMMUNITY)
Admission: EM | Admit: 2022-12-27 | Discharge: 2022-12-28 | Disposition: A | Discharge status: Home / Self Care | Payer: MEDICAID | Attending: Emergency Medicine | Admitting: Emergency Medicine

## 2022-12-27 ENCOUNTER — Other Ambulatory Visit: Payer: Self-pay

## 2022-12-27 DIAGNOSIS — R000 Tachycardia, unspecified: Secondary | ICD-10-CM | POA: Insufficient documentation

## 2022-12-27 DIAGNOSIS — Z91010 Allergy to peanuts: Secondary | ICD-10-CM | POA: Insufficient documentation

## 2022-12-27 DIAGNOSIS — F312 Bipolar disorder, current episode manic severe with psychotic features: Secondary | ICD-10-CM | POA: Diagnosis present

## 2022-12-27 DIAGNOSIS — E119 Type 2 diabetes mellitus without complications: Secondary | ICD-10-CM | POA: Insufficient documentation

## 2022-12-27 DIAGNOSIS — Z1152 Encounter for screening for COVID-19: Secondary | ICD-10-CM | POA: Diagnosis not present

## 2022-12-27 DIAGNOSIS — F32A Depression, unspecified: Secondary | ICD-10-CM

## 2022-12-27 LAB — RAPID URINE DRUG SCREEN, HOSP PERFORMED
Amphetamines: NOT DETECTED
Barbiturates: NOT DETECTED
Benzodiazepines: NOT DETECTED
Cocaine: NOT DETECTED
Opiates: NOT DETECTED
Tetrahydrocannabinol: NOT DETECTED

## 2022-12-27 LAB — CBC WITH DIFFERENTIAL/PLATELET
Abs Immature Granulocytes: 0.04 10*3/uL (ref 0.00–0.07)
Basophils Absolute: 0.1 10*3/uL (ref 0.0–0.1)
Basophils Relative: 1 %
Eosinophils Absolute: 0.4 10*3/uL (ref 0.0–0.5)
Eosinophils Relative: 4 %
HCT: 37.4 % (ref 36.0–46.0)
Hemoglobin: 11.4 g/dL — ABNORMAL LOW (ref 12.0–15.0)
Immature Granulocytes: 1 %
Lymphocytes Relative: 18 %
Lymphs Abs: 1.4 10*3/uL (ref 0.7–4.0)
MCH: 24.1 pg — ABNORMAL LOW (ref 26.0–34.0)
MCHC: 30.5 g/dL (ref 30.0–36.0)
MCV: 79.1 fL — ABNORMAL LOW (ref 80.0–100.0)
Monocytes Absolute: 0.4 10*3/uL (ref 0.1–1.0)
Monocytes Relative: 5 %
Neutro Abs: 5.7 10*3/uL (ref 1.7–7.7)
Neutrophils Relative %: 71 %
Platelets: 287 10*3/uL (ref 150–400)
RBC: 4.73 MIL/uL (ref 3.87–5.11)
RDW: 15.2 % (ref 11.5–15.5)
WBC: 8 10*3/uL (ref 4.0–10.5)
nRBC: 0 % (ref 0.0–0.2)

## 2022-12-27 LAB — COMPREHENSIVE METABOLIC PANEL
ALT: 15 U/L (ref 0–44)
AST: 17 U/L (ref 15–41)
Albumin: 4.1 g/dL (ref 3.5–5.0)
Alkaline Phosphatase: 57 U/L (ref 38–126)
Anion gap: 12 (ref 5–15)
BUN: 7 mg/dL (ref 6–20)
CO2: 21 mmol/L — ABNORMAL LOW (ref 22–32)
Calcium: 9.1 mg/dL (ref 8.9–10.3)
Chloride: 103 mmol/L (ref 98–111)
Creatinine, Ser: 0.72 mg/dL (ref 0.44–1.00)
GFR, Estimated: >60 mL/min (ref >60)
Glucose, Bld: 118 mg/dL — ABNORMAL HIGH (ref 70–99)
Potassium: 3.5 mmol/L (ref 3.5–5.1)
Sodium: 136 mmol/L (ref 135–145)
Total Bilirubin: 0.8 mg/dL (ref 0.3–1.2)
Total Protein: 8.1 g/dL (ref 6.5–8.1)

## 2022-12-27 LAB — SALICYLATE LEVEL: Salicylate Lvl: <7 mg/dL — ABNORMAL LOW (ref 7.0–30.0)

## 2022-12-27 LAB — ACETAMINOPHEN LEVEL: Acetaminophen (Tylenol), Serum: <10 ug/mL — ABNORMAL LOW (ref 10–30)

## 2022-12-27 LAB — HCG, SERUM, QUALITATIVE: Preg, Serum: NEGATIVE

## 2022-12-27 LAB — ETHANOL: Alcohol, Ethyl (B): <10 mg/dL (ref <10)

## 2022-12-27 MED ORDER — TRAZODONE HCL 50 MG PO TABS
150.0000 mg | ORAL_TABLET | Freq: Every day | ORAL | Status: DC
  Administered 2022-12-28: 150 mg via ORAL
  Filled 2022-12-27: qty 2

## 2022-12-27 MED ORDER — HYDROXYZINE HCL 25 MG PO TABS: 50.0000 mg | ORAL_TABLET | Freq: Three times a day (TID) | ORAL | Status: DC | PRN

## 2022-12-27 MED ORDER — RISPERIDONE 0.5 MG PO TBDP
1.0000 mg | ORAL_TABLET | Freq: Every day | ORAL | Status: DC
  Administered 2022-12-28: 1 mg via ORAL
  Filled 2022-12-27: qty 2

## 2022-12-27 MED ORDER — LORAZEPAM 2 MG/ML IJ SOLN: 2.0000 mg | INTRAMUSCULAR | Status: DC

## 2022-12-27 NOTE — Consult Note (Signed)
BH ED ASSESSMENT   Reason for Consult: Claude Manges, PA-C Referring Physician: flat affect Patient Identification: Barbara George MRN:  [id number] ED Chief Complaint: Bipolar I disorder, current or most recent episode manic, with psychotic features (HCC)  Diagnosis:  Principal Problem:   Bipolar I disorder, current or most recent episode manic, with psychotic features [facility])   ED Assessment Time Calculation: Start Time: 1700 Stop Time: 1740 Total Time in Minutes (Assessment Completion): 40   Subjective:  Barbara George is a 27 y.o. female with a history of schizoaffective disorder bipolar type who presents in person accompanied by her husband and father to [facility].    HPI:  Patient is seen and examined.  Patient is a 27 year old female with a past psychiatric history significant for schizoaffective disorder.  Patient is laying flat on her back in the hospital gurney. Provider introduces herself, patient father is at the bedside. Patient does not respond to provider and is selectively mute.  On examination today she is selectively mute. Patient father stated that he and patient husband were concerned with patient lack of sleeping, she has not spoken since last night per patient father. Her last meal was also yesterday. Patient husband who is in the waiting room, states that patient has not been well, he asked if patient could go back to the behavioral hospital where she was for 11 days, he says she did well over there and he felt she was getting the proper treatment.   Barbara George who is patient husband feels that she needs to be recommended inpatient, because once she came from [facility], he says she has not been herself. Father expressed this has happened several times previously where she stops her medications, begins behaving abnormally, and results in her having an inpatient psychiatric treatment. He notes that she improves after each inpatient treatment, but does not adhere to  her medications.   During evaluation Barbara George is laying in her hospital gurney in no acute distress. Unable to assess patient orientation as she is mute. Patient is alert. Patient does not speak to provider, except when provider ask her if I can touch her hand to feel temperature, she said "no". Patient eyes do not follow my hand in her face, she has a forward gaze, she has her arms resting beside her, with hand palms down, she does have a small tremor, provider ask if she is cold and she does not respond. Provider lift both hands and right foot, and patient dropped them down, in not acute distress. Patient does not respond to SI/HI/AVH.  Discussed with patient and family the need for inpatient care to stabilize patient, and they are all in agreement. As patient is not necessarily a danger to herself nor others, recommend admission to the Behavioral unit and possible inpatient medication stabilization for symptomatic improvement.   Past Psychiatric History: schizoaffective disorder.   Risk to Self or Others: Risk to Self: Yes, currently at risk to self only do to inability to sleep and active psychosis (mania and delusional thinking) however the patient is not endorsing any self-harm, suicidal ideation and has not had any active suicidality.  Risk to Others:  No  Prior Inpatient Therapy:  Yes  Prior Outpatient Therapy: Yes     [place] Scale:  Flowsheet Row [facility] Most recent reading at 12/11/2022  9:27 PM ED from 12/11/2022 in [facility] Most recent reading at 12/11/2022 11:51 AM  Counselor from 12/10/2022 in [facility] Most recent reading at 12/10/2022  8:05 AM  C-SSRS RISK CATEGORY No Risk No Risk No Risk       AIMS:  , , ,  ,   ASAM:    Substance Abuse:     Past Medical History:  Past Medical History:  Diagnosis Date   Bipolar 1  disorder (HCC)    Depression    Diabetes mellitus without complication (HCC)    Type 2 (Pre)   Schizophrenia (HCC)    History reviewed. No pertinent surgical history. Family History:  Family History  Problem Relation Age of Onset   Hypertension Father     Social History:  Social History   Substance and Sexual Activity  Alcohol Use No   Alcohol/week: 0.0 standard drinks of alcohol     Social History   Substance and Sexual Activity  Drug Use No    Social History   Socioeconomic History   Marital status: Married    Spouse name: Barbara   Number of children: Not on file   Years of education: Not on file   Highest education level: Associate degree: occupational, Scientist, product/process development, or vocational program  Occupational History   Occupation: unemployed  Tobacco Use   Smoking status: Never   Smokeless tobacco: Never  Vaping Use   Vaping status: Never Used  Substance and Sexual Activity   Alcohol use: No    Alcohol/week: 0.0 standard drinks of alcohol   Drug use: No   Sexual activity: Yes    Birth control/protection: None  Other Topics Concern   Not on file  Social History Narrative   ** Merged History Encounter **       Social Determinants of Health   Financial Resource Strain: Not on file  Food Insecurity: No Food Insecurity (12/11/2022)   Hunger Vital Sign    Worried About Running Out of Food in the Last Year: Never true    Ran Out of Food in the Last Year: Never true  Transportation Needs: No Transportation Needs (12/11/2022)   PRAPARE - Administrator, Civil Service (Medical): No    Lack of Transportation (Non-Medical): No  Physical Activity: Inactive (04/03/2019)   Exercise Vital Sign    Days of Exercise per Week: 0 days    Minutes of Exercise per Session: 0 min  Stress: Not on file  Social Connections: Socially Integrated (04/03/2019)   Social Connection and Isolation Panel [NHANES]    Frequency of Communication with Friends and Family: More than three  times a week    Frequency of Social Gatherings with Friends and Family: Once a week    Attends Religious Services: More than 4 times per year    Active Member of [employer] or Organizations: Yes    Attends Banker Meetings: Not on file    Marital Status: Married      Allergies:   Allergies  Allergen Reactions   Peanut-Containing Drug Products Nausea And Vomiting   Pork-Derived Products Rash and Other (See Comments)    Religious purposes    Labs:  Results for orders placed or performed during the hospital encounter of 12/27/22 (from the past 48 hour(s))  Comprehensive metabolic panel     Status: Abnormal   Collection Time: 12/27/22  5:18 PM  Result Value Ref Range   Sodium 136 135 - 145 mmol/L   Potassium 3.5 3.5 - 5.1 mmol/L   Chloride 103 98 - 111 mmol/L  CO2 21 (L) 22 - 32 mmol/L   Glucose, Bld 118 (H) 70 - 99 mg/dL    Comment: Glucose reference range applies only to samples taken after fasting for at least 8 hours.   BUN 7 6 - 20 mg/dL   Creatinine, Ser 8.29 0.44 - 1.00 mg/dL   Calcium 9.1 8.9 - 56.2 mg/dL   Total Protein 8.1 6.5 - 8.1 g/dL   Albumin 4.1 3.5 - 5.0 g/dL   AST 17 15 - 41 U/L   ALT 15 0 - 44 U/L   Alkaline Phosphatase 57 38 - 126 U/L   Total Bilirubin 0.8 0.3 - 1.2 mg/dL   GFR, Estimated >13 >08 mL/min    Comment: (NOTE) Calculated using the CKD-EPI Creatinine Equation (2021)    Anion gap 12 5 - 15    Comment: Performed at [facility], [address] [place], [place] [address]  Ethanol     Status: None   Collection Time: 12/27/22  5:18 PM  Result Value Ref Range   Alcohol, Ethyl (B) <10 <10 mg/dL    Comment: (NOTE) Lowest detectable limit for serum alcohol is 10 mg/dL.  For medical purposes only. Performed at [facility], [address], [place], [place] [address]   CBC with Diff     Status: Abnormal   Collection Time: 12/27/22  5:18 PM  Result Value Ref Range   WBC 8.0 4.0 - 10.5 K/uL   RBC  4.73 3.87 - 5.11 MIL/uL   Hemoglobin 11.4 (L) 12.0 - 15.0 g/dL   HCT 52.8 41.3 - 24.4 %   MCV 79.1 (L) 80.0 - 100.0 fL   MCH 24.1 (L) 26.0 - 34.0 pg   MCHC 30.5 30.0 - 36.0 g/dL   RDW 01.0 27.2 - 53.6 %   Platelets 287 150 - 400 K/uL   nRBC 0.0 0.0 - 0.2 %   Neutrophils Relative % 71 %   Neutro Abs 5.7 1.7 - 7.7 K/uL   Lymphocytes Relative 18 %   Lymphs Abs 1.4 0.7 - 4.0 K/uL   Monocytes Relative 5 %   Monocytes Absolute 0.4 0.1 - 1.0 K/uL   Eosinophils Relative 4 %   Eosinophils Absolute 0.4 0.0 - 0.5 K/uL   Basophils Relative 1 %   Basophils Absolute 0.1 0.0 - 0.1 K/uL   Immature Granulocytes 1 %   Abs Immature Granulocytes 0.04 0.00 - 0.07 K/uL    Comment: Performed at [facility], [address], [place], [place] [address]  Salicylate level     Status: Abnormal   Collection Time: 12/27/22  5:18 PM  Result Value Ref Range   Salicylate Lvl <7.0 (L) 7.0 - 30.0 mg/dL    Comment: Performed at [facility], [address] [place], [place] [address]  Acetaminophen level     Status: Abnormal   Collection Time: 12/27/22  5:18 PM  Result Value Ref Range   Acetaminophen (Tylenol), Serum <10 (L) 10 - 30 ug/mL    Comment: (NOTE) Therapeutic concentrations vary significantly. A range of 10-30 ug/mL  may be an effective concentration for many patients. However, some  are best treated at concentrations outside of this range. Acetaminophen concentrations >150 ug/mL at 4 hours after ingestion  and >50 ug/mL at 12 hours after ingestion are often associated with  toxic reactions.  Performed at [facility], [address] [place], [place] [address]     Current Facility-Administered Medications  Medication  Dose Route Frequency Provider Last Rate Last Admin   LORazepam (ATIVAN) injection 2 mg  2 mg Intramuscular NOW Motley-Mangrum, Shareece Bultman A, PMHNP       Current Outpatient Medications  Medication Sig Dispense Refill    ARIPiprazole (ABILIFY) 10 MG tablet Take 1 tablet (10 mg total) by mouth as directed for 15 doses. Taper off oral abilify as follows: Take 2 tablets once daily for 5 days. Then decrease to taking 1 tablet once daily for 5 days. Then stop. 15 tablet 0   [START ON 01/15/2023] ARIPiprazole ER (ABILIFY MAINTENA) 400 MG SRER injection Inject 2 mLs (400 mg total) into the muscle every 28 (twenty-eight) days for 1 dose. Next dose is due on 01-15-23. 1 each 0   hydrOXYzine (ATARAX) 50 MG tablet Take 1 tablet (50 mg total) by mouth 3 (three) times daily as needed for anxiety. 30 tablet 0   risperiDONE (RISPERDAL M-TABS) 1 MG disintegrating tablet Take 1 tablet (1 mg total) by mouth at bedtime. 30 tablet 0   simvastatin (ZOCOR) 10 MG tablet Take 1 tablet (10 mg total) by mouth daily at 6 PM for 14 days. 14 tablet 0   traZODone (DESYREL) 150 MG tablet Take 1 tablet (150 mg total) by mouth at bedtime. 30 tablet 0    Musculoskeletal: Strength & Muscle Tone: within normal limits Gait & Station: normal Patient leans: N/A   Psychiatric Specialty Exam: Presentation  General Appearance:  Appropriate for Environment; Fairly Groomed  Eye Contact: Good  Speech: Clear and Coherent  Speech Volume: Normal  Handedness: Right   Mood and Affect  Mood: Euthymic  Affect: Congruent   Thought Process  Thought Processes: Coherent  Descriptions of Associations:Intact  Orientation:Full (Time, Place and Person)  Thought Content:Logical  History of Schizophrenia/Schizoaffective disorder:Yes  Duration of Psychotic Symptoms:Less than six months  Hallucinations:No data recorded Ideas of Reference:None  Suicidal Thoughts:No data recorded Homicidal Thoughts:No data recorded  Sensorium  Memory: Immediate Good  Judgment: Fair  Insight: Fair   Art therapist  Concentration: Good  Attention Span: Good  Recall: [employer] of Knowledge: Fair  Language: Fair   Psychomotor  Activity  Psychomotor Activity:No data recorded  Assets  Assets: Housing; Health and safety inspector; Resilience; Social Support    Sleep  Sleep:No data recorded  Physical Exam: Physical Exam Vitals and nursing note reviewed. Exam conducted with a chaperone present.  Neurological:     Mental Status: She is alert.  Psychiatric:        Attention and Perception: Attention normal.        Mood and Affect: Affect is flat.        Speech: She is noncommunicative.        Behavior: Behavior is uncooperative and withdrawn.     Comments: Unable to assess patient thought content, cognition or judgement due to patient mutism.     ROS Blood pressure (!) 154/104, pulse (!) 114, temperature 98 F (36.7 C), temperature source Oral, resp. rate 16, height 5\' 8"  (1.727 m), weight 108.6 kg, SpO2 100%. Body mass index is 36.4 kg/m.   Medical Decision Making: Patient case review and discussed with Dr. Lucianne Muss. Patient needs inpatient psychiatric admission for stabilization and treatment. Will restart patient night medications, will order Ativan 2 mg IM to rule out catatonia due to patient flat affect.    Disposition: Recommend psychiatric Inpatient admission.  Alona Bene, PMHNP 12/27/2022 6:04 PM

## 2022-12-27 NOTE — ED Provider Notes (Signed)
[place] EMERGENCY DEPARTMENT AT [facility] Provider Note   CSN: [id number] Arrival date & time: 12/27/22  1509     History  Chief Complaint  Patient presents with   Mental Health Problem    Barbara George is a 27 y.o. female.  27 y.o female with a PMH of depression, schizophrenia, diabetes presents to the ED with a chief complaint of depression.  She is accompanied by her father, who reports patient stopped eating last night, he is concerned that she has not been taking her medication, refuses to talk to anyone today.  She has an appointment to follow-up with psychiatry on January 09, 2023.  Her father at the bedside reports that patient has been not wanting to speak to anyone, has appeared very withdrawn.  She has not endorsed any SI, HI, visual or auditory hallucinations.  The history is provided by the patient.  Mental Health Problem Presenting symptoms: no self-mutilation and no suicidal thoughts   Associated symptoms: no abdominal pain and no chest pain        Home Medications Prior to Admission medications   Medication Sig Start Date End Date Taking? Authorizing Provider  ARIPiprazole (ABILIFY) 10 MG tablet Take 1 tablet (10 mg total) by mouth as directed for 15 doses. Taper off oral abilify as follows: Take 2 tablets once daily for 5 days. Then decrease to taking 1 tablet once daily for 5 days. Then stop. 12/21/22   Massengill, Harrold Donath, MD  ARIPiprazole ER (ABILIFY MAINTENA) 400 MG SRER injection Inject 2 mLs (400 mg total) into the muscle every 28 (twenty-eight) days for 1 dose. Next dose is due on 01-15-23. 01/15/23 01/16/23  Phineas Inches, MD  hydrOXYzine (ATARAX) 50 MG tablet Take 1 tablet (50 mg total) by mouth 3 (three) times daily as needed for anxiety. 12/21/22   Massengill, Harrold Donath, MD  risperiDONE (RISPERDAL M-TABS) 1 MG disintegrating tablet Take 1 tablet (1 mg total) by mouth at bedtime. 12/21/22 01/20/23  Massengill, Harrold Donath, MD  simvastatin  (ZOCOR) 10 MG tablet Take 1 tablet (10 mg total) by mouth daily at 6 PM for 14 days. 12/21/22 01/04/23  Massengill, Harrold Donath, MD  traZODone (DESYREL) 150 MG tablet Take 1 tablet (150 mg total) by mouth at bedtime. 12/21/22 01/20/23  Massengill, Harrold Donath, MD      Allergies    Peanut-containing drug products and Pork-derived products    Review of Systems   Review of Systems  Constitutional:  Negative for fever.  Respiratory:  Negative for shortness of breath.   Cardiovascular:  Negative for chest pain.  Gastrointestinal:  Negative for abdominal pain.  Genitourinary:  Negative for flank pain.  Musculoskeletal:  Negative for back pain.  Psychiatric/Behavioral:  Positive for behavioral problems. Negative for self-injury and suicidal ideas.   All other systems reviewed and are negative.   Physical Exam Updated Vital Signs BP (!) 161/96 (BP Location: Right Arm)   Pulse 96   Temp 98.1 F (36.7 C) (Oral)   Resp 16   Ht 5\' 8"  (1.727 m)   Wt 108.6 kg   SpO2 100%   BMI 36.40 kg/m  Physical Exam Vitals and nursing note reviewed.  Constitutional:      Appearance: Normal appearance.  HENT:     Head: Normocephalic and atraumatic.     Nose: Nose normal.     Mouth/Throat:     Mouth: Mucous membranes are moist.  Cardiovascular:     Rate and Rhythm: Tachycardia present.  Pulmonary:  Effort: Pulmonary effort is normal.     Breath sounds: No wheezing or rales.  Abdominal:     General: Abdomen is flat.     Palpations: Abdomen is soft.     Tenderness: There is no abdominal tenderness.  Musculoskeletal:     Cervical back: Normal range of motion and neck supple.  Skin:    General: Skin is warm and dry.  Neurological:     Mental Status: She is alert and oriented to person, place, and time.  Psychiatric:        Attention and Perception: She is inattentive.        Mood and Affect: Affect is flat.        Speech: She is noncommunicative.        Behavior: Behavior is withdrawn.     ED  Results / Procedures / Treatments   Labs (all labs ordered are listed, but only abnormal results are displayed) Labs Reviewed  COMPREHENSIVE METABOLIC PANEL - Abnormal; Notable for the following components:      Result Value   CO2 21 (*)    Glucose, Bld 118 (*)    All other components within normal limits  CBC WITH DIFFERENTIAL/PLATELET - Abnormal; Notable for the following components:   Hemoglobin 11.4 (*)    MCV 79.1 (*)    MCH 24.1 (*)    All other components within normal limits  SALICYLATE LEVEL - Abnormal; Notable for the following components:   Salicylate Lvl <7.0 (*)    All other components within normal limits  ACETAMINOPHEN LEVEL - Abnormal; Notable for the following components:   Acetaminophen (Tylenol), Serum <10 (*)    All other components within normal limits  ETHANOL  RAPID URINE DRUG SCREEN, HOSP PERFORMED  HCG, SERUM, QUALITATIVE    EKG None  Radiology No results found.  Procedures Procedures    Medications Ordered in ED Medications  LORazepam (ATIVAN) injection 2 mg (has no administration in time range)  traZODone (DESYREL) tablet 150 mg (has no administration in time range)  risperiDONE (RISPERDAL M-TABS) disintegrating tablet 1 mg (has no administration in time range)  hydrOXYzine (ATARAX) tablet 50 mg (has no administration in time range)    ED Course/ Medical Decision Making/ A&P                             Medical Decision Making Amount and/or Complexity of Data Reviewed Labs: ordered.   Patient here with depression, changes in her mood over the last 24 hours.  According to father at the bedside, patient is not speaking to him today, has not eaten anything since last night, has been taking medications that were prescribed to her but he reports that these medications are not working.  Patient used to previously take these medications approximately 4 years ago.  She also was on olanzapine which actually ended up working well for her.  Patient  is noncommunicative, appears withdrawn, with flat affect.  She does not answer any questions and is staring into the wall at this time.  We discussed appropriate medical clearance along with further evaluation by psychiatry.  Interpretation of her blood work revealed a CBC of no leukocytosis, hemoglobin is stable.  CMP with no electrolyte derangement, creatinine level is within normal limits.  LFTs are unremarkable.  Salicylate, ethanol, acetaminophen are all within normal limits.  She does not endorse any pain, she does also has not spoken to me since she arrived here.  I do feel that she needs psychiatric evaluation at this time.  Patient is medically clear for psychiatric evaluation.  Portions of this note were generated with Scientist, clinical (histocompatibility and immunogenetics). Dictation errors may occur despite best attempts at proofreading.   Final Clinical Impression(s) / ED Diagnoses Final diagnoses:  Depression, unspecified depression type    Rx / DC Orders ED Discharge Orders     None         Claude Manges, Cordelia Poche 12/27/22 1913    Glyn Ade, MD 12/27/22 2046

## 2022-12-27 NOTE — ED Triage Notes (Signed)
Family in triage with patient, family states "she was just seen and discharged but not getting better." Family has packet of information on dc from [facility]. Patient there 7/9-7/19. Patient not engaging at all in triage process.

## 2022-12-27 NOTE — Progress Notes (Signed)
[facility] is reviewing this Pt. CSW sent additional requested information for continued review. 1st CSW to follow up regarding status of BH referral.    Damita Dunnings, MSW, LCSW-A  11:53 PM 12/27/2022

## 2022-12-27 NOTE — Progress Notes (Signed)
Patient has been by [facility] due to no appropriate beds available. Patient meets BH inpatient criteria per Alona Bene, PMHNP. Patient has been faxed out to the following facilities:   [facility]  [address] [place] [address] [phone]  [facility]  [address] [place] [address] [phone]  [facility]  [address] [place] [address] [phone]  [facility]  [address] [place] [address] [phone]  [facility]  [address], [place] [address] [phone]  [facility]  [address] [place] [address] [phone]  [facility]  [address] [place] [address] [phone]  [facility]  [address], [place] [address] [phone]  [facility]  [address] [place], [place] [address] [phone]  [facility]  [address] [place], [place] [address] [phone]  [facility]  [address] [place] [address] [phone]  [facility]  [address] [place] [address] [phone]  [facility]  [address] [place] [address] [phone]   Damita Dunnings, MSW, LCSW-A  8:28 PM 12/27/2022

## 2022-12-28 ENCOUNTER — Other Ambulatory Visit: Payer: Self-pay

## 2022-12-28 ENCOUNTER — Inpatient Hospital Stay (HOSPITAL_COMMUNITY)
Admission: AD | Admit: 2022-12-28 | Discharge: 2023-01-18 | DRG: 885 | Disposition: A | Discharge status: Home / Self Care | Payer: MEDICAID | Source: Intra-hospital | Attending: Psychiatry | Admitting: Psychiatry

## 2022-12-28 ENCOUNTER — Encounter (HOSPITAL_COMMUNITY): Payer: Self-pay | Admitting: Adult Health

## 2022-12-28 DIAGNOSIS — F940 Selective mutism: Secondary | ICD-10-CM | POA: Diagnosis present

## 2022-12-28 DIAGNOSIS — Z9151 Personal history of suicidal behavior: Secondary | ICD-10-CM | POA: Diagnosis not present

## 2022-12-28 DIAGNOSIS — E7800 Pure hypercholesterolemia, unspecified: Secondary | ICD-10-CM | POA: Diagnosis present

## 2022-12-28 DIAGNOSIS — Z560 Unemployment, unspecified: Secondary | ICD-10-CM | POA: Diagnosis not present

## 2022-12-28 DIAGNOSIS — Z91010 Allergy to peanuts: Secondary | ICD-10-CM

## 2022-12-28 DIAGNOSIS — F250 Schizoaffective disorder, bipolar type: Principal | ICD-10-CM | POA: Diagnosis present

## 2022-12-28 DIAGNOSIS — Z79899 Other long term (current) drug therapy: Secondary | ICD-10-CM

## 2022-12-28 DIAGNOSIS — R45851 Suicidal ideations: Secondary | ICD-10-CM | POA: Diagnosis present

## 2022-12-28 DIAGNOSIS — F411 Generalized anxiety disorder: Secondary | ICD-10-CM | POA: Diagnosis present

## 2022-12-28 DIAGNOSIS — Z8249 Family history of ischemic heart disease and other diseases of the circulatory system: Secondary | ICD-10-CM | POA: Diagnosis not present

## 2022-12-28 DIAGNOSIS — F259 Schizoaffective disorder, unspecified: Secondary | ICD-10-CM | POA: Diagnosis present

## 2022-12-28 DIAGNOSIS — I10 Essential (primary) hypertension: Secondary | ICD-10-CM | POA: Diagnosis present

## 2022-12-28 DIAGNOSIS — Z1152 Encounter for screening for COVID-19: Secondary | ICD-10-CM | POA: Diagnosis not present

## 2022-12-28 DIAGNOSIS — E559 Vitamin D deficiency, unspecified: Secondary | ICD-10-CM | POA: Diagnosis present

## 2022-12-28 DIAGNOSIS — Z91014 Allergy to mammalian meats: Secondary | ICD-10-CM | POA: Diagnosis not present

## 2022-12-28 DIAGNOSIS — G4700 Insomnia, unspecified: Secondary | ICD-10-CM | POA: Diagnosis present

## 2022-12-28 DIAGNOSIS — Z91199 Patient's noncompliance with other medical treatment and regimen due to unspecified reason: Secondary | ICD-10-CM

## 2022-12-28 DIAGNOSIS — E785 Hyperlipidemia, unspecified: Secondary | ICD-10-CM | POA: Diagnosis present

## 2022-12-28 DIAGNOSIS — E119 Type 2 diabetes mellitus without complications: Secondary | ICD-10-CM | POA: Diagnosis present

## 2022-12-28 DIAGNOSIS — F312 Bipolar disorder, current episode manic severe with psychotic features: Secondary | ICD-10-CM | POA: Diagnosis not present

## 2022-12-28 LAB — RESP PANEL BY RT-PCR (RSV, FLU A&B, COVID)  RVPGX2
Influenza A by PCR: NEGATIVE
Influenza B by PCR: NEGATIVE
Resp Syncytial Virus by PCR: NEGATIVE
SARS Coronavirus 2 by RT PCR: NEGATIVE

## 2022-12-28 MED ORDER — TRAZODONE HCL 150 MG PO TABS
150.0000 mg | ORAL_TABLET | Freq: Every day | ORAL | Status: DC
  Administered 2022-12-28 – 2023-01-05 (×9): 150 mg via ORAL
  Filled 2022-12-28 (×14): qty 1

## 2022-12-28 MED ORDER — DIPHENHYDRAMINE HCL 25 MG PO CAPS
50.0000 mg | ORAL_CAPSULE | Freq: Three times a day (TID) | ORAL | Status: DC | PRN
  Administered 2023-01-01 – 2023-01-15 (×9): 50 mg via ORAL
  Filled 2022-12-28 (×9): qty 2

## 2022-12-28 MED ORDER — LORAZEPAM 2 MG/ML IJ SOLN
2.0000 mg | Freq: Three times a day (TID) | INTRAMUSCULAR | Status: DC | PRN
  Administered 2023-01-03 – 2023-01-13 (×5): 2 mg via INTRAMUSCULAR
  Filled 2022-12-28 (×7): qty 1

## 2022-12-28 MED ORDER — LORAZEPAM 1 MG PO TABS
2.0000 mg | ORAL_TABLET | Freq: Three times a day (TID) | ORAL | Status: DC | PRN
  Administered 2023-01-01 – 2023-01-15 (×8): 2 mg via ORAL
  Filled 2022-12-28 (×8): qty 2

## 2022-12-28 MED ORDER — ALUM & MAG HYDROXIDE-SIMETH 200-200-20 MG/5ML PO SUSP: 30.0000 mL | ORAL | Status: DC | PRN

## 2022-12-28 MED ORDER — HALOPERIDOL LACTATE 5 MG/ML IJ SOLN
5.0000 mg | Freq: Three times a day (TID) | INTRAMUSCULAR | Status: DC | PRN
  Administered 2023-01-03: 5 mg via INTRAMUSCULAR
  Filled 2022-12-28 (×2): qty 1

## 2022-12-28 MED ORDER — RISPERIDONE 1 MG PO TBDP
1.0000 mg | ORAL_TABLET | Freq: Every day | ORAL | Status: DC
  Administered 2022-12-28: 1 mg via ORAL
  Filled 2022-12-28 (×4): qty 1

## 2022-12-28 MED ORDER — HALOPERIDOL 5 MG PO TABS
5.0000 mg | ORAL_TABLET | Freq: Three times a day (TID) | ORAL | Status: DC | PRN
  Administered 2023-01-01 – 2023-01-06 (×4): 5 mg via ORAL
  Filled 2022-12-28 (×4): qty 1

## 2022-12-28 MED ORDER — HYDROXYZINE HCL 50 MG PO TABS
50.0000 mg | ORAL_TABLET | Freq: Three times a day (TID) | ORAL | Status: DC | PRN
  Administered 2022-12-30 – 2023-01-17 (×26): 50 mg via ORAL
  Filled 2022-12-28 (×27): qty 1

## 2022-12-28 MED ORDER — DIPHENHYDRAMINE HCL 50 MG/ML IJ SOLN
50.0000 mg | Freq: Three times a day (TID) | INTRAMUSCULAR | Status: DC | PRN
  Administered 2023-01-03 – 2023-01-13 (×5): 50 mg via INTRAMUSCULAR
  Filled 2022-12-28 (×7): qty 1

## 2022-12-28 MED ORDER — MAGNESIUM HYDROXIDE 400 MG/5ML PO SUSP: 30.0000 mL | Freq: Every day | ORAL | Status: DC | PRN

## 2022-12-28 MED ORDER — ACETAMINOPHEN 325 MG PO TABS
650.0000 mg | ORAL_TABLET | Freq: Four times a day (QID) | ORAL | Status: DC | PRN
  Administered 2022-12-31 – 2023-01-15 (×3): 650 mg via ORAL
  Filled 2022-12-28 (×3): qty 2

## 2022-12-28 NOTE — Progress Notes (Addendum)
Pt has been accepted to [facility] TODAY 12/28/2022, pending IVC paperwork faxed to [phone]. Bed assignment: 506-1  Pt meets inpatient criteria per Ophelia Shoulder, NP  Attending Physician will be Phineas Inches, MD  Report can be called to: - Adult unit: [phone]  Pt can arrive after 1400  Care Team Notified: [facility] Malva Limes, RN, Julaine Hua, Paramedic, Thornton Dales, RN, Ophelia Shoulder, NP, and Anson Fret, RN  Karina Lenderman, LCSW  12/28/2022 12:21 PM

## 2022-12-28 NOTE — ED Notes (Signed)
[facility] called for transport 

## 2022-12-28 NOTE — ED Notes (Signed)
Pt given turkey sandwich and juice.  

## 2022-12-28 NOTE — ED Notes (Signed)
Pt has been up all night slept for maybe 30 min all night. Pt went in room 36 came out when nurse asked her to currently just walking around.

## 2022-12-28 NOTE — Progress Notes (Signed)
   12/28/22 2100  Psych Admission Type (Psych Patients Only)  Admission Status Voluntary  Psychosocial Assessment  Patient Complaints Depression  Eye Contact Fair  Facial Expression Flat  Affect Depressed  Speech Soft  Interaction Minimal  Motor Activity Slow  Appearance/Hygiene Unremarkable  Behavior Characteristics Cooperative;Appropriate to situation  Mood Depressed  Thought Process  Coherency WDL  Content WDL  Delusions None reported or observed  Perception WDL  Hallucination None reported or observed  Judgment Impaired  Confusion WDL  Danger to Self  Current suicidal ideation? Denies  Danger to Others  Danger to Others None reported or observed

## 2022-12-28 NOTE — ED Notes (Signed)
Pt continually entering another pts room. She was easily redirected.

## 2022-12-28 NOTE — Progress Notes (Signed)
  1:1 Nursing Note 12/28/2022 22:00   Patient is presently laying in bed asleep.  Patient is covered with a blanket from head to toe.  Patient's breathing is normal and no distress is noted.    Patient is presently on 1:1 with a sitter at her side.    Patient is safe on the unit with q 15 minute safety checks.

## 2022-12-28 NOTE — Progress Notes (Signed)
Patient admitted to the unit due to increased psychiatric symptoms. Unable to complete assessment due to patient being unable to participate. Patient was noted to be laughing in appropriately, guarded and psychomotor retarded. Skin assessment complete and patient found to be free of all injury. Patient continues with bizarre behaviors.

## 2022-12-28 NOTE — ED Notes (Signed)
Patient has been laying on the floor in the hall way and outside of her room making crying noises. Pt was not actually crying. Patient was also seen waving to people who aren't there. Patient is now sitting at a chair near the nurses station. Patient was cooperative with morning vitals.

## 2022-12-28 NOTE — ED Notes (Signed)
Moved Patient belonging bag to Cabinet 42. Cabinet 37 is broken.

## 2022-12-28 NOTE — Progress Notes (Incomplete)
  On assessment, patient is observed visiting with family.  Patient has been assigned a 1:1 sitter who will be present with patient at all times until such order is concluded.  Patient denies SI/HI/AVH.  Scheduled medications and PRN medications are administered.  Patient is safe on the unit with q15 minute safety

## 2022-12-28 NOTE — ED Notes (Signed)
Report called to [facility] 

## 2022-12-28 NOTE — ED Provider Notes (Signed)
Emergency Medicine Observation Re-evaluation Note  Barbara George is a 27 y.o. female, seen on rounds today.  Pt initially presented to the ED for complaints of Mental Health Problem Currently, the patient is awaiting inpatient placement for active bipolar with psychotic features..  Physical Exam  BP (!) 133/94 (BP Location: Right Arm)   Pulse (!) 108   Temp 98 F (36.7 C) (Oral)   Resp 16   Ht 5\' 8"  (1.727 m)   Wt 108.6 kg   SpO2 100%   BMI 36.40 kg/m  Physical Exam General: Patient is alert.  She is up ambulating in the hallway.  She is directly interacting but seems to be responding to internal stimuli as well. No respiratory distress.  Ambulating with coordinated gait.  ED Course / MDM  EKG:   I have reviewed the labs performed to date as well as medications administered while in observation.  Recent changes in the last 24 hours include none.  Plan  Current plan is for inpatient psychiatric treatment.    Arby Barrette, MD 12/28/22 [phone]

## 2022-12-28 NOTE — Plan of Care (Signed)
  Problem: Activity: Goal: Interest or engagement in activities will improve Outcome: Progressing   Problem: Nutritional: Goal: Ability to achieve adequate nutritional intake will improve Outcome: Progressing

## 2022-12-29 DIAGNOSIS — F250 Schizoaffective disorder, bipolar type: Secondary | ICD-10-CM

## 2022-12-29 DIAGNOSIS — F411 Generalized anxiety disorder: Secondary | ICD-10-CM | POA: Diagnosis present

## 2022-12-29 MED ORDER — LORAZEPAM 1 MG PO TABS
1.0000 mg | ORAL_TABLET | Freq: Two times a day (BID) | ORAL | Status: AC
  Administered 2022-12-29 – 2022-12-30 (×4): 1 mg via ORAL
  Filled 2022-12-29 (×4): qty 1

## 2022-12-29 MED ORDER — SIMVASTATIN 5 MG PO TABS
10.0000 mg | ORAL_TABLET | Freq: Every day | ORAL | Status: DC
  Administered 2022-12-29 – 2023-01-18 (×21): 10 mg via ORAL
  Filled 2022-12-29 (×21): qty 2
  Filled 2022-12-29: qty 1
  Filled 2022-12-29 (×2): qty 2

## 2022-12-29 MED ORDER — PROPRANOLOL HCL 10 MG PO TABS
10.0000 mg | ORAL_TABLET | Freq: Two times a day (BID) | ORAL | Status: DC
  Administered 2022-12-29 – 2023-01-13 (×29): 10 mg via ORAL
  Filled 2022-12-29 (×40): qty 1

## 2022-12-29 MED ORDER — CLONIDINE HCL 0.1 MG PO TABS: 0.1000 mg | ORAL_TABLET | Freq: Four times a day (QID) | ORAL | Status: DC | PRN

## 2022-12-29 MED ORDER — RISPERIDONE 2 MG PO TBDP
2.0000 mg | ORAL_TABLET | Freq: Two times a day (BID) | ORAL | Status: DC
  Administered 2022-12-29 – 2022-12-31 (×5): 2 mg via ORAL
  Filled 2022-12-29 (×9): qty 1

## 2022-12-29 NOTE — Group Note (Signed)
Date:  12/29/2022 Time:  1:37 PM  Group Topic/Focus:  Healthy Communication:   The focus of this group is to discuss communication, barriers to communication, as well as healthy ways to communicate with others.    Participation Level:  Active  Participation Quality:  Appropriate  Affect:  Appropriate  Cognitive:  Appropriate  Insight: Good  Engagement in Group:  Engaged  Modes of Intervention:  Discussion    Donell Beers 12/29/2022, 1:37 PM

## 2022-12-29 NOTE — Group Note (Signed)
Date:  12/29/2022 Time:  1:22 PM  Group Topic/Focus:  Goals Group:   The focus of this group is to help patients establish daily goals to achieve during treatment and discuss how the patient can incorporate goal setting into their daily lives to aide in recovery.    Participation Level:  Active  Participation Quality:  Inattentive  Affect:  Blunted  Cognitive:  Lacking  Insight: Lacking  Engagement in Group:  Lacking  Modes of Intervention:  Exploration and Support   Donell Beers 12/29/2022, 1:22 PM

## 2022-12-29 NOTE — Progress Notes (Signed)
Patient being monitored 1:1 without incident. Patient denies SI, HI and AVH. Patient has had no incidents of behavioral dyscontrol this shift. Continue to monitor as planned. Patient able to contract for safety.

## 2022-12-29 NOTE — Progress Notes (Signed)
   12/29/22 2022  Psych Admission Type (Psych Patients Only)  Admission Status Involuntary  Psychosocial Assessment  Patient Complaints None  Eye Contact Avoids  Facial Expression Animated  Affect Appropriate to circumstance  Speech Soft;Elective mutism (elective mutism at times when talking to her)  Interaction Cautious  Motor Activity Other (Comment) (wnl)  Appearance/Hygiene In hospital gown  Behavior Characteristics Appropriate to situation  Mood Pleasant  Thought Process  Coherency Blocking  Content WDL  Delusions None reported or observed  Perception WDL  Hallucination None reported or observed  Judgment Poor  Confusion None  Danger to Self  Current suicidal ideation? Denies

## 2022-12-29 NOTE — Progress Notes (Signed)
1:1 Nursing note (late entry) Patient has been observed up in the dayroom tonight. She attended group and remained in the dayroom and watched tv for a while. She was compliant with her hs medication. Support given and safety maintained with sitter, 1:1 continues.

## 2022-12-29 NOTE — Progress Notes (Signed)
    1:1 Nursing Note 12/29/2022 0200   Patient is presently asleep, in bed, with her eyes closed.  Patient respirations are even and unlabored; No distress is noted.  Patient continues to have a 1:1 sitter at her side, at all times.  Patient is safe on the unit with q15 minute safety checks.

## 2022-12-29 NOTE — Progress Notes (Signed)
  Patient being monitored 1:1 without incident. Patient denies SI, HI and AVH. Patient has had no incidents of behavioral dyscontrol this shift. Continue to monitor as planned. Patient able to contract for safety.

## 2022-12-29 NOTE — [facility] Suicide Risk Assessment (Signed)
Suicide Risk Assessment  Admission Assessment    [facility] Admission Suicide Risk Assessment   Nursing information obtained from:  Patient Demographic factors:  Adolescent or young adult Current Mental Status:  NA Loss Factors:  NA Historical Factors:  NA Risk Reduction Factors:  NA  Total Time spent with patient: 1.5 hours Principal Problem: Schizoaffective disorder, bipolar type (HCC) Diagnosis:  Principal Problem:   Schizoaffective disorder, bipolar type (HCC) Active Problems:   Hyperlipidemia   Type II diabetes mellitus (HCC)   GAD (generalized anxiety disorder)  Reason For Admission: Barbara George is a 27 yo AAF with prior mental health diagnosis of Schizoaffective d/o who was recently treated at this [facility] from 7/09 through 12/21/22. Pt was taken to the [facility] prior to this hospitalization on 07/25 by her husband and father because she stopped eating, stopped talking & became more withdrawn the day prior as per ER documentation. Father also reported a history of non medication compliance in pt typically after discharge, which always leads to a relapse in her symptoms.  Pt was IVC'd and transferred back to [facility] for treatment and, stabilization of her mental status.   Continued Clinical Symptoms: Psychosis as well as catatonia in need of continuous hospitalization for treatment and stabilization of mental status.  Alcohol Use Disorder Identification Test Final Score (AUDIT): 0 The "Alcohol Use Disorders Identification Test", Guidelines for Use in Primary Care, Second Edition.  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 Score between 0-7:  no or low risk or alcohol related problems. Score between 8-15:  moderate risk of alcohol related problems. Score between 16-19:  high risk of alcohol related problems. Score 20 or above:  warrants further diagnostic evaluation for alcohol dependence and treatment.  CLINICAL FACTORS:   More than one psychiatric diagnosis Previous  Psychiatric Diagnoses and Treatments  Musculoskeletal: Strength & Muscle Tone: within normal limits Gait & Station: normal Patient leans: N/A  Psychiatric Specialty Exam:  Presentation  General Appearance:  Fairly Groomed  Eye Contact: Minimal  Speech: Blocked  Speech Volume: Decreased  Handedness: Right   Mood and Affect  Mood: Depressed  Affect: Congruent   Thought Process  Thought Processes: Other (comment) (blocked, mute)  Descriptions of Associations:-- (blocked, mute)  Orientation:Other (comment)  Thought Content:Other (comment)  History of Schizophrenia/Schizoaffective disorder:Yes  Duration of Psychotic Symptoms:Greater than six months  Hallucinations:Hallucinations: Other (comment) Description of Auditory Hallucinations: blocked, mute  Ideas of Reference:Paranoia  Suicidal Thoughts:Suicidal Thoughts: -- (blocked, mute)  Homicidal Thoughts:Homicidal Thoughts: -- (blocked, mute)   Sensorium  Memory: -- (blocked, mute)  Judgment: Other (comment) (unable to assess, patient selectively mute)  Insight: Other (comment) (unable to assess, patient selectively mute)   Executive Functions  Concentration: Poor  Attention Span: Poor  Recall: Poor  Fund of Knowledge: Poor  Language: Poor   Psychomotor Activity  Psychomotor Activity: Psychomotor Activity: Other (comment)   Assets  Assets: Resilience   Sleep  Sleep: Sleep: Poor    Physical Exam: Physical Exam Review of Systems  Psychiatric/Behavioral:  Positive for depression and hallucinations. Negative for memory loss, substance abuse and suicidal ideas. The patient is nervous/anxious and has insomnia.    Pulse (!) 133, resp. rate 20, height 5\' 8"  (1.727 m), weight 101.2 kg, SpO2 100%. Body mass index is 33.91 kg/m.   COGNITIVE FEATURES THAT CONTRIBUTE TO RISK:  None    SUICIDE RISK:   Moderate: Based on pt's past admissions, and current presentation of  psychosis and catatonia, she is at a moderate risk of  suicide.  PLAN OF CARE: See H & P  I certify that inpatient services furnished can reasonably be expected to improve the patient's condition.   Starleen Blue, NP 12/29/2022, 1:51 PM

## 2022-12-29 NOTE — Group Note (Signed)
Date:  12/29/2022 Time:  8:53 PM  Group Topic/Focus:  Wrap-Up Group:   The focus of this group is to help patients review their daily goal of treatment and discuss progress on daily workbooks.    Participation Level:  Active  Participation Quality:  Appropriate  Affect:  Flat  Cognitive:  Oriented  Insight: Lacking  Engagement in Group:  Engaged  Modes of Intervention:  Education and Exploration  Additional Comments:  Patient attend and participated in group tonight. She reports that respect was important to her today.  Lita Mains [facility] 12/29/2022, 8:53 PM

## 2022-12-29 NOTE — H&P (Signed)
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    Discharge Planning: Social work and case management to assist with discharge planning and identification of hospital follow-up needs prior to discharge Estimated LOS: 5-7 days Discharge Concerns: Need to establish a safety plan;  Medication compliance and effectiveness Discharge Goals: Return home with outpatient referrals for mental health follow-up including medication management/psychotherapy  I certify that inpatient services furnished can reasonably be expected to improve the patient's condition.    Starleen Blue, NP 7/27/20241:41 PM

## 2022-12-29 NOTE — Progress Notes (Signed)
   12/29/22 0537  15 Minute Checks  Location Bedroom  Visual Appearance Calm  Behavior Sleeping  Sleep (Behavioral Health Patients Only)  Calculate sleep? (Click Yes once per 24 hr at 0600 safety check) Yes  Documented sleep last 24 hours 7

## 2022-12-30 DIAGNOSIS — F250 Schizoaffective disorder, bipolar type: Secondary | ICD-10-CM | POA: Diagnosis not present

## 2022-12-30 LAB — VITAMIN D 25 HYDROXY (VIT D DEFICIENCY, FRACTURES): Vit D, 25-Hydroxy: 20.09 ng/mL — ABNORMAL LOW (ref 30–100)

## 2022-12-30 LAB — AMMONIA: Ammonia: <10 umol/L (ref 9–35)

## 2022-12-30 NOTE — Progress Notes (Signed)
   12/30/22 0630  15 Minute Checks  Location Hallway  Visual Appearance Calm  Behavior Composed  Sleep (Behavioral Health Patients Only)  Calculate sleep? (Click Yes once per 24 hr at 0600 safety check) Yes  Documented sleep last 24 hours 5

## 2022-12-30 NOTE — Progress Notes (Signed)
Patient remains with 1:1 sitter. Complied with medications.

## 2022-12-30 NOTE — Progress Notes (Signed)
Patient vomited clear fluids this morning before she ate her breakfast. She reports that she does not feel bad. Will report to day shift nurse

## 2022-12-30 NOTE — BHH Counselor (Signed)
CSW introduced self through in-person interpretor and received consent from patient to complete PSA. Patient stopped answering questions within approx 10 minutes and requested to complete at another time.   No other concerns noted.

## 2022-12-30 NOTE — Progress Notes (Signed)
Patient is currently up in the dayroom having her vitals taken. She remained in the dayroom and watched tv. She continues to display elective mutism and laughing inappropriately at times. 1:1 continues and patient is safe.

## 2022-12-30 NOTE — Progress Notes (Addendum)
1:1 Nursing note - Patient is currently awake  1:1 continues and patient is safe.

## 2022-12-30 NOTE — Progress Notes (Signed)
Patient has been up praying and was encouraged to try and get more rest once she finished praying. It has been difficult for her to rest and she received vistaril to her her relax.

## 2022-12-30 NOTE — Progress Notes (Signed)
Patient remains with 1:1 sitter. Patient ate lunch as scheduled. Patient was observed pouring water on the floor, and cleaning it up with a towel.

## 2022-12-30 NOTE — Progress Notes (Signed)
[facility] MD Progress Note  12/30/2022 4:49 PM Barbara George  MRN:  [id number] Subjective:    Barbara George is a 27 year old African-American female with a past psychiatric history significant for schizoaffective disorder (bipolar type) who was admitted to [facility] from [facility], ED after presenting to the ED on 7/25 by her husband and father because she stopped eating, stopped talking, and became more withdrawn the day prior.  Per patient's father, history of nonmedication compliance and patient typically after discharge, which always leads to a relapse in her symptoms.  Patient was already receiving transferred back to [facility] under IV[facility] for treatment and stabilization of her mental status.  Patient was assessed by Karel Jarvis, PA-C for reevaluation.  Patient is currently being managed on the following psychiatric medications:  Hydroxyzine 50 mg 3 times daily as needed Propranolol 10 mg 2 times daily Risperidone 2 mg 2 times daily Trazodone 150 mg at bedtime  Agitation protocol: -Diphenhydramine 50 mg p.o. or injection, 3 times daily as needed for agitation -Haloperidol 5 mg p.o. or injection, 3 times daily as needed for agitation -Lorazepam 2 mg p.o. or injection, 3 times daily as needed for agitation  During the assessment, patient was vocal in the presence of the language interpreter.  When asked why she has been hospitalized, patient replied by stating the city and country from which she is from [place], [place]).  Patient was asked if she could remember anything from when she was admitted to [facility], ED, to which she replied "she was in [place]."  When asked if she knows where she is currently staying at, she replied "[facility]."  Provider asked about depression to which the patient replied "No, the only thing she needs to believe what she is saying and what she knows.  For her to have a good recovery, she needs to believe in what she is saying  and what she knows."  Patient was asked if she had anxiety, to which she replied "No."  Patient was asked if she was experiencing any auditory or visual hallucinations, to which she replied " that question is very complicated and I am not able to respond to the question."  Patient was asked why the question was complicated, to which she replied " responding to the question when she does not know what, is not good.  She prefers to [facility] herself and say she does not know it than to respond to it."  Patient was asked about the events that led up to her hospitalization, to which she replied "Only God knows why she is able to talk or to say something."  Provider attempted to converse with patient again however patient remained silent throughout the encounter.  Provider was unable to fully assess patient due to thought blocking and patient being mute.  Provider attempted to contact patient's spouse and father for collateral; however, provider was unsuccessful in reaching out to them.  Principal Problem: Schizoaffective disorder, bipolar type (HCC) Diagnosis: Principal Problem:   Schizoaffective disorder, bipolar type (HCC) Active Problems:   Hyperlipidemia   Type II diabetes mellitus (HCC)   GAD (generalized anxiety disorder)  Total Time spent with patient: 35 minutes   Past Psychiatric History:  See H&P  Past Medical History:  Past Medical History:  Diagnosis Date   Bipolar 1 disorder (HCC)    Depression    Diabetes mellitus without complication (HCC)    Type 2 (Pre)   Schizophrenia (HCC)    History reviewed.  No pertinent surgical history. Family History:  Family History  Problem Relation Age of Onset   Hypertension Father    Family Psychiatric  History:  See H&P  Social History:  Social History   Substance and Sexual Activity  Alcohol Use No   Alcohol/week: 0.0 standard drinks of alcohol     Social History   Substance and Sexual Activity  Drug Use No    Social History    Socioeconomic History   Marital status: Married    Spouse name: Amadou   Number of children: Not on file   Years of education: Not on file   Highest education level: Associate degree: occupational, Scientist, product/process development, or vocational program  Occupational History   Occupation: unemployed  Tobacco Use   Smoking status: Never   Smokeless tobacco: Never  Vaping Use   Vaping status: Never Used  Substance and Sexual Activity   Alcohol use: No    Alcohol/week: 0.0 standard drinks of alcohol   Drug use: No   Sexual activity: Yes    Birth control/protection: None  Other Topics Concern   Not on file  Social History Narrative   ** Merged History Encounter **       Social Determinants of Health   Financial Resource Strain: Not on file  Food Insecurity: Patient Declined (12/28/2022)   Hunger Vital Sign    Worried About Running Out of Food in the Last Year: Patient declined    Ran Out of Food in the Last Year: Patient declined  Transportation Needs: Patient Declined (12/28/2022)   PRAPARE - Administrator, Civil Service (Medical): Patient declined    Lack of Transportation (Non-Medical): Patient declined  Physical Activity: Inactive (04/03/2019)   Exercise Vital Sign    Days of Exercise per Week: 0 days    Minutes of Exercise per Session: 0 min  Stress: Not on file  Social Connections: Socially Integrated (04/03/2019)   Social Connection and Isolation Panel [NHANES]    Frequency of Communication with Friends and Family: More than three times a week    Frequency of Social Gatherings with Friends and Family: Once a week    Attends Religious Services: More than 4 times per year    Active Member of [employer] or Organizations: Yes    Attends Engineer, structural: Not on file    Marital Status: Married   Additional Social History:  See H&P  Sleep: Unable to assess  Appetite: Unable to assess  Current Medications: Current Facility-Administered Medications  Medication  Dose Route Frequency Provider Last Rate Last Admin   acetaminophen (TYLENOL) tablet 650 mg  650 mg Oral Q6H PRN Massengill, Harrold Donath, MD       alum & mag hydroxide-simeth (MAALOX/MYLANTA) 200-200-20 MG/5ML suspension 30 mL  30 mL Oral Q4H PRN Massengill, Nathan, MD       cloNIDine (CATAPRES) tablet 0.1 mg  0.1 mg Oral Q6H PRN Abbott Pao, Nadir, MD       diphenhydrAMINE (BENADRYL) capsule 50 mg  50 mg Oral TID PRN Massengill, Harrold Donath, MD       Or   diphenhydrAMINE (BENADRYL) injection 50 mg  50 mg Intramuscular TID PRN Massengill, Harrold Donath, MD       haloperidol (HALDOL) tablet 5 mg  5 mg Oral TID PRN Massengill, Harrold Donath, MD       Or   haloperidol lactate (HALDOL) injection 5 mg  5 mg Intramuscular TID PRN Massengill, Harrold Donath, MD       hydrOXYzine (ATARAX) tablet 50 mg  50 mg Oral TID PRN Phineas Inches, MD   50 mg at 12/30/22 0341   LORazepam (ATIVAN) tablet 2 mg  2 mg Oral TID PRN Phineas Inches, MD       Or   LORazepam (ATIVAN) injection 2 mg  2 mg Intramuscular TID PRN Massengill, Harrold Donath, MD       LORazepam (ATIVAN) tablet 1 mg  1 mg Oral BID Starleen Blue, NP   1 mg at 12/30/22 0818   magnesium hydroxide (MILK OF MAGNESIA) suspension 30 mL  30 mL Oral Daily PRN Massengill, Harrold Donath, MD       propranolol (INDERAL) tablet 10 mg  10 mg Oral BID Starleen Blue, NP   10 mg at 12/30/22 0816   risperiDONE (RISPERDAL M-TABS) disintegrating tablet 2 mg  2 mg Oral BID Starleen Blue, NP   2 mg at 12/30/22 0816   simvastatin (ZOCOR) tablet 10 mg  10 mg Oral q1800 Starleen Blue, NP   10 mg at 12/29/22 1827   traZODone (DESYREL) tablet 150 mg  150 mg Oral QHS Massengill, Harrold Donath, MD   150 mg at 12/29/22 2038    Lab Results:  Results for orders placed or performed during the hospital encounter of 12/28/22 (from the past 48 hour(s))  Ammonia     Status: None   Collection Time: 12/30/22  6:42 AM  Result Value Ref Range   Ammonia <10 9 - 35 umol/L    Comment: Performed at [facility],  [address] [place], [place] [address]  VITAMIN D 25 Hydroxy (Vit-D Deficiency, Fractures)     Status: Abnormal   Collection Time: 12/30/22  6:42 AM  Result Value Ref Range   Vit D, 25-Hydroxy 20.09 (L) 30 - 100 ng/mL    Comment: (NOTE) Vitamin D deficiency has been defined by the Institute of Medicine  and an Endocrine Society practice guideline as a level of serum 25-OH  vitamin D less than 20 ng/mL (1,2). The Endocrine Society went on to  further define vitamin D insufficiency as a level between 21 and 29  ng/mL (2).  1. IOM (Institute of Medicine). 2010. Dietary reference intakes for  calcium and D. [place]: [employer]. 2. Holick MF, Binkley Orland, Bischoff-Ferrari HA, et al. Evaluation,  treatment, and prevention of vitamin D deficiency: an Endocrine  Society clinical practice guideline, JCEM. 2011 Jul; 96(7): 1911-30.  Performed at [facility], [address] [place], [place] [address]     Blood Alcohol level:  Lab Results  Component Value Date   [facility] <10 12/27/2022   ETH <10 12/11/2022    Metabolic Disorder Labs: Lab Results  Component Value Date   HGBA1C 6.2 (H) 12/11/2022   MPG 131 12/11/2022   MPG 111.15 10/23/2018   Lab Results  Component Value Date   PROLACTIN 13.0 12/11/2022   PROLACTIN CANCELED 09/17/2022   Lab Results  Component Value Date   CHOL 228 (H) 12/11/2022   TRIG 122 12/11/2022   HDL 91 12/11/2022   CHOLHDL 2.5 12/11/2022   VLDL 24 12/11/2022   LDLCALC 113 (H) 12/11/2022   LDLCALC 95 10/23/2018    Physical Findings: AIMS:  , ,  ,  ,    CIWA:    COWS:     Musculoskeletal: Strength & Muscle Tone: within normal limits Gait & Station: normal Patient leans: N/A  Psychiatric Specialty Exam:  Presentation  General Appearance:  Casual; Appropriate for Environment  Eye Contact: Fair  Speech:  Blocked (Mostly blocked)  Speech Volume: Decreased  Handedness: Right   Mood and Affect   Mood: Depressed  Affect: Congruent   Thought Process  Thought Processes: Irrevelant (Mostly blocked, mute)  Descriptions of Associations:-- (Mostly blocked, mute)  Orientation:Other (comment)  Thought Content:Other (comment)  History of Schizophrenia/Schizoaffective disorder:Yes  Duration of Psychotic Symptoms:Greater than six months  Hallucinations:Hallucinations: Other (comment) Description of Auditory Hallucinations: Mostly blocked, mute  Ideas of Reference:Delusions  Suicidal Thoughts:Suicidal Thoughts: -- (Unable to assess due to thought blocking and patient being mostly mute.)  Homicidal Thoughts:Homicidal Thoughts: -- (Unable to assess due to thought blocking and patient being mostly mute.)   Sensorium  Memory: Other (comment) (Unable to assess due to thought blocking and patient being mostly mute.)  Judgment: Other (comment) (Unable to assess due to thought blocking and patient being mostly mute.)  Insight: Other (comment) (Unable to assess due to thought blocking and patient being mostly mute.)   Art therapist  Concentration: Poor  Attention Span: Poor  Recall: Poor  Fund of Knowledge: Poor  Language: Poor   Psychomotor Activity  Psychomotor Activity: Psychomotor Activity: Other (comment)   Assets  Assets: Resilience; Social Support   Sleep  Sleep: Sleep: -- (Unable to assess due to thought blocking and patient being mostly mute.)    Physical Exam: Physical Exam Constitutional:      Appearance: Normal appearance.  HENT:     Head: Normocephalic and atraumatic.     Nose: Nose normal.     Mouth/Throat:     Mouth: Mucous membranes are moist.  Eyes:     Extraocular Movements: Extraocular movements intact.  Cardiovascular:     Rate and Rhythm: Normal rate and regular rhythm.  Pulmonary:     Effort: Pulmonary effort is normal.  Abdominal:     General: Abdomen is flat.  Musculoskeletal:        General: Normal range  of motion.     Cervical back: Normal range of motion.  Skin:    General: Skin is warm and dry.  Psychiatric:        Mood and Affect: Mood is depressed.        Speech: She is noncommunicative.        Behavior: Behavior is withdrawn.        Cognition and Memory: Cognition is impaired.     Comments: Mostly unable to assess due to thought blocking and patient being mute    Review of Systems  Constitutional: Negative.   HENT: Negative.    Eyes: Negative.   Respiratory: Negative.    Cardiovascular: Negative.   Gastrointestinal: Negative.   Skin: Negative.   Neurological: Negative.   Psychiatric/Behavioral:  Positive for depression and hallucinations. Negative for substance abuse. Memory loss: Unable to assess. Suicidal ideas: Unable to assess.Nervous/anxious: Unable to assess. Insomnia: Unable to assess.    Blood pressure (!) 111/92, pulse 96, temperature 98.7 F (37.1 C), temperature source Oral, resp. rate 20, height 5\' 8"  (1.727 m), weight 101.2 kg, SpO2 100%. Body mass index is 33.91 kg/m.   Treatment Plan Summary: Daily contact with patient to assess and evaluate symptoms and progress in treatment and Medication management  Plan:  Patient exhibited thought blocking and was mostly mute during most of the assessment.  When patient was able to communicate, patient provided bizarre responses to the questions addressed to her.  Patient to remain on her current psychiatric medication regimen.  Patient to continue to be evaluated and assessed to determine efficacy of treatment.  Safety and Monitoring: Voluntary admission to inpatient psychiatric unit for safety, stabilization and treatment Daily contact with patient to assess and evaluate symptoms and progress in treatment Patient's case to be discussed in multi-disciplinary team meeting Observation Level : q15 minute checks Vital signs: q12 hours Precautions: Safety  Diagnosis: Principal Problem:   Schizoaffective disorder,  bipolar type (HCC) Active Problems:   Hyperlipidemia   Type II diabetes mellitus (HCC)   GAD (generalized anxiety disorder)  #Schizoaffective disorder (bipolar type) -Continue risperidone 2 mg 2 times daily for psychosis and mood stabilization  Agitation protocol: -Diphenhydramine 50 mg p.o. or injection, 3 times daily as needed for agitation -Haloperidol 5 mg p.o. or injection, 3 times daily as needed for agitation -Lorazepam 2 mg p.o. or injection, 3 times daily as needed for agitation  #Generalized anxiety disorder -Continue hydroxyzine 50 mg 3 times daily as needed for anxiety -Continue propranolol 10 mg 2 times daily for anxiety  #Hyperlipidemia -Continue simvastatin 10 mg daily  Insomnia -Continue trazodone 150 mg at bedtime for the management of insomnia  Hypertension -Continue clonidine 0.1 mg every 6 hours as needed for systolic blood pressure greater than 160 or diastolic blood pressure less greater than 100  As needed medications: -Patient to continue taking Tylenol 650 mg every 6 hours as needed for mild pain -Patient to continue taking Maalox/Mylanta 30 mL every 4 hours as needed for indigestion -Patient to continue taking Milk of Magnesia 30 mL as needed for mild constipation  Labs reviewed: Ammonia level was found to be within normal limits (<10 umol/L).  Vitamin D level was found to be decreased (20.09 ng/mL).  Prolactin level pending.  Discharge Planning: Social work and case management to assist with discharge planning and identification of hospital follow-up needs prior to discharge Estimated LOS: 5-7 days Discharge Concerns: Need to establish a safety plan; Medication compliance and effectiveness Discharge Goals: Return home with outpatient referrals for mental health follow-up including medication management/psychotherapy   I certify that inpatient services furnished can reasonably be expected to improve the patient's condition.    Meta Hatchet,  PA 12/30/2022, 4:49 PM

## 2022-12-30 NOTE — Group Note (Signed)
Date:  12/30/2022 Time:  9:03 PM  Group Topic/Focus:  Wrap-Up Group:   The focus of this group is to help patients review their daily goal of treatment and discuss progress on daily workbooks.    Participation Level:  Did Not Attend   Scot Dock 12/30/2022, 9:03 PM

## 2022-12-30 NOTE — Progress Notes (Signed)
Patient remains with 1:1 sitter.

## 2022-12-30 NOTE — Progress Notes (Signed)
1:1 nursing note - Patients husband visited on tonight and she left her husband in the dayroom during visitation and went to her room and just sat on her bed. Writer asked if she was going to tell her husband goodbye but she did not respond to Clinical research associate instead she just stared at the floor. Writer gave her medications a little early tonight since she decided to go to bed and was falling asleep. She was compliant with medication, declined snack and returned to her room to rest. 1:1 continues and patient is safe with sitter at her side.

## 2022-12-30 NOTE — Plan of Care (Signed)
?  Problem: Education: ?Goal: Mental status will improve ?Outcome: Progressing ?Goal: Verbalization of understanding the information provided will improve ?Outcome: Progressing ?  ?

## 2022-12-31 ENCOUNTER — Encounter (HOSPITAL_COMMUNITY): Payer: Self-pay

## 2022-12-31 DIAGNOSIS — F250 Schizoaffective disorder, bipolar type: Secondary | ICD-10-CM | POA: Diagnosis not present

## 2022-12-31 LAB — PROLACTIN: Prolactin: 34.3 ng/mL — ABNORMAL HIGH (ref 4.8–33.4)

## 2022-12-31 MED ORDER — HALOPERIDOL 5 MG PO TABS
5.0000 mg | ORAL_TABLET | Freq: Two times a day (BID) | ORAL | Status: DC
  Administered 2022-12-31 – 2023-01-01 (×2): 5 mg via ORAL
  Filled 2022-12-31 (×7): qty 1

## 2022-12-31 MED ORDER — VITAMIN D (ERGOCALCIFEROL) 1.25 MG (50000 UNIT) PO CAPS
50000.0000 [IU] | ORAL_CAPSULE | ORAL | Status: DC
  Administered 2023-01-01 – 2023-01-15 (×3): 50000 [IU] via ORAL
  Filled 2022-12-31 (×4): qty 1

## 2022-12-31 NOTE — Progress Notes (Signed)
1:1 Nursing Note  Patient alert and oriented. Attended group tonight. Patient spent time in the dayroom before going to bed. One incident occurred when patient went to get something to drink and pulled her pants down in front of everyone. Patient re-directed. Continues to have elective mutism. Patient just stares at you when you ask her anything. Took bedtime medications. Continues 1:1 for safety. Patient remains safe.

## 2022-12-31 NOTE — Group Note (Signed)
Date:  12/31/2022 Time:  8:56 PM  Group Topic/Focus:  Wrap-Up Group:   The focus of this group is to help patients review their daily goal of treatment and discuss progress on daily workbooks.    Participation Level:  None  Participation Quality:  Resistant  Affect:  Resistant  Cognitive:   Resistant  Insight: None  Engagement in Group:  None  Modes of Intervention:  Discussion  Additional Comments:  Pt attend wrap up group but chose not to participate.  Felipa Furnace 12/31/2022, 8:56 PM

## 2022-12-31 NOTE — Progress Notes (Signed)
Recreation Therapy Notes  LRT attempted to complete recreation therapy assessment with patient. Patient was playful throughout attempt to complete assessment. Patient wouldn't answer questions and avoided answering questions by switching the subject. Patient was also laughing and would start dancing during assessment. LRT will attempt to complete assessment at a later time.       [name], LRT,CTRS Lexi Conaty A Verba Ainley-McCall 12/31/2022 1:25 PM

## 2022-12-31 NOTE — Progress Notes (Signed)
1:1 Nursing note- Patient is currently up in the dayroom getting her vitals taken.  She has slept better than last night but less verbal with staff. 1:1 continues and patient is safe with sitter at patients side.

## 2022-12-31 NOTE — Progress Notes (Signed)
D- Patient alert and oriented. Denies SI, HI, VH, and pain. Endorses AH, but did not respond when asked what they were saying. Patient minimal in interaction.   A- Scheduled medications administered to patient, per MD orders. Support and encouragement provided. 1:1 monitoring in place.  Patient informed to notify staff with problems or concerns.  R- No adverse drug reactions noted. Patient contracts for safety at this time. Patient compliant with medications and treatment plan. Patient receptive, calm, and cooperative. Patient interacts well with others on the unit.  Patient remains safe at this time.

## 2022-12-31 NOTE — [facility] Counselor (Signed)
Adult Comprehensive Assessment  Patient ID: Barbara George, female   DOB: December 12, 1995, 27 y.o.   MRN: [id number]  Information Source: Information source: Patient  Current Stressors:  Patient states their primary concerns and needs for treatment are:: " not eating " Patient states their goals for this hospitilization and ongoing recovery are:: " I need to go home and go to school " Educational / Learning stressors: None reported Employment / Job issues: "No, I have never had a job" Family Relationships: None reported Surveyor, quantity / Lack of resources (include bankruptcy): None reported Housing / Lack of housing: None reported Physical health (include injuries & life threatening diseases): None reported Social relationships: None reported Substance abuse: None reported Bereavement / Loss: None reported  Living/Environment/Situation:  Living Arrangements: Spouse/significant other Living conditions (as described by patient or guardian): " we live in a 2 bdrm apt" Who else lives in the home?: "husband and husband's nephew he is 38" How long has patient lived in current situation?: 5 years What is atmosphere in current home: Comfortable, Supportive  Family History:  Marital status: Married Number of Years Married: 5 What types of issues is patient dealing with in the relationship?: Denies any issues Additional relationship information: None reported Are you sexually active?: Yes What is your sexual orientation?: Heterosexual Has your sexual activity been affected by drugs, alcohol, medication, or emotional stress?: N/A Does patient have children?: No  Childhood History:  By whom was/is the patient raised?: Both parents, Grandparents Additional childhood history information: Grew up in [place] Description of patient's relationship with caregiver when they were a child: Sometimes good and sometimes bad with parents  Patient's description of current relationship with people who  raised him/her: " its good with my dad " How were you disciplined when you got in trouble as a child/adolescent?: States she was well behaved Does patient have siblings?: Yes Number of Siblings: 2 Description of patient's current relationship with siblings: Pt did not say Did patient suffer any verbal/emotional/physical/sexual abuse as a child?: No Did patient suffer from severe childhood neglect?: No Has patient ever been sexually abused/assaulted/raped as an adolescent or adult?: No Was the patient ever a victim of a crime or a disaster?: No Witnessed domestic violence?: No Has patient been affected by domestic violence as an adult?: No  Education:  Highest grade of school patient has completed: HS Currently a student?: Yes Name of school: " I go online " How long has the patient attended?: " 9 months" Learning disability?: No  Employment/Work Situation:   Employment Situation: Surveyor, minerals Job has Been Impacted by Current Illness: No What is the Longest Time Patient has Held a Job?: Never worked  Where was the Patient Employed at that Time?: Never worked  Has Patient ever Been in Equities trader?: No  Financial Resources:   Surveyor, quantity resources: Writer Does patient have a Lawyer or guardian?: No  Alcohol/Substance Abuse:   What has been your use of drugs/alcohol within the last 12 months?: Pt denies If attempted suicide, did drugs/alcohol play a role in this?: No Alcohol/Substance Abuse Treatment Hx: Denies past history If yes, describe treatment: None reported Has alcohol/substance abuse ever caused legal problems?: No  Social Support System:   Conservation officer, nature Support System: Fair Museum/gallery exhibitions officer System: " husband and dad " Type of faith/religion: " Islam " How does patient's faith help to cope with current illness?: Pt did not say  Leisure/Recreation:   Do You Have Hobbies?: No  Strengths/Needs:  What is the patient's perception  of their strengths?: " I am a helpful and do a lot around the house " Patient states they can use these personal strengths during their treatment to contribute to their recovery: None reported Patient states these barriers may affect/interfere with their treatment: No barriers shared Patient states these barriers may affect their return to the community: No barriers shared Other important information patient would like considered in planning for their treatment: N/A  Discharge Plan:   Currently receiving community mental health services: Yes (From Whom) Patient states concerns and preferences for aftercare planning are: " [facility] for medication management and [facility] for therapy " Patient states they will know when they are safe and ready for discharge when: Pt did not say Does patient have access to transportation?: Yes Does patient have financial barriers related to discharge medications?: No Patient description of barriers related to discharge medications: " I have insurance "  Summary/Recommendations:   Summary and Recommendations (to be completed by the evaluator): Barbara George is a 27 y.o. female voluntarily admitted to [facility] for depression, reports from father of  her not eating, not been taking her medication, and refusing to talk to family. Patient did not express any stressors other than needing to get back home so she can get back to school. Patient during assessment was smiling and dancing, not really focused or reponding much to CSW questions.  Patient was admitted about 2-weeks ago with same symptoms . Patient has a history of Schizoaffective disorder, bipolar type , GAD, and psychosis. patient states that she will return back with husband once release. Patient is connected with [facility] and her Follow up appointment is still for 01/09/2023 from last admission in July .While here, Barbara George  can benefit from crisis stabilization, medication management, therapeutic milieu, and  referrals for services.   Isabella Bowens. 12/31/2022

## 2022-12-31 NOTE — Progress Notes (Signed)
[facility] MD Progress Note  12/31/2022 4:13 PM Barbara George  MRN:  [id number]  Reason For Admission: Barbara George is a 27 year old African-American female with a past psychiatric history significant for schizoaffective disorder (bipolar type) who was admitted to [facility] from [facility], ED after presenting to the ED on 7/25 by her husband and father because she stopped eating, stopped talking, and became more withdrawn the day prior.  Per patient's father, history of nonmedication compliance and patient typically after discharge, which always leads to a relapse in her symptoms.  Patient was already receiving transferred back to [facility] under [facility] for treatment and stabilization of her mental status.  24 hr chart review: Sleep Hours last night: Nursing Concerns: Remains on one-on-one staff monitoring, due to sexually inappropriate behaviors. Behavioral episodes in the past 24 hrs: As above Medication Compliance: Compliant Vital Signs in the past 24 hrs: HR was 134 earlier today morning, nursing staff asked to recheck PRN Medications in the past 24 hrs: Hydroxyzine and Tylenol  Patient assessment note: During encounter today, patient is disorganized, answers questions in a nonlinear manner, she is incoherent, illogical at times, elated, and manic.  When talking with Clinical research associate, she is dancing, and moving her hips upwards and downwards, and laughing inappropriately, requiring multiple verbal redirections by Clinical research associate to stop.  Overall presentation is bizarre, as patient goes from exhibiting behaviors above, suggest staring at writer at other times when questions are asked.  When asked if she is feeling suicidal, patient states "no more".  She also denies AVH, denies paranoia.  Patient is asked if she is getting special messages just for her, she responds that she is getting special messages "from [place]".  Patient is asked about her appetite and if she is eating well,  and she states "do not worry about it".  Patient is asked if she slept last night, and states "perfect, I can't complain about that.  If you know, you know."  Patient is observed to be talking to another patient on the unit in a dialect which is understood by just patient and the other patient on the 504, and when asked what they are talking about patient smiles and giggles, but refuses to talk about what they are discussing.  Since patient is exhibiting sexually inappropriate behaviors, and speaking in a language understood by just her and another patient on the unit, we will keep her on one-to-one monitoring for her safety.  Patient has been on Risperdal 2 mg twice daily since admission to the unit, but psychosis is persistent.  We will discontinue Risperdal, and start Haldol 5 mg twice daily for management of psychosis and for mood stabilization starting tonight at 2100.  Continuing other medications as listed below.  Supplementing low vitamin D with vitamin D 50,000 units weekly.  We will continue to monitor her progress. No TD/EPS type symptoms found on assessment, and pt denies any feelings of stiffness. AIMS: 0.   Principal Problem: Schizoaffective disorder, bipolar type (HCC) Diagnosis: Principal Problem:   Schizoaffective disorder, bipolar type (HCC) Active Problems:   Hyperlipidemia   Type II diabetes mellitus (HCC)   GAD (generalized anxiety disorder)  Total Time spent with patient: 35 minutes   Past Psychiatric History:  See H&P  Past Medical History:  Past Medical History:  Diagnosis Date   Bipolar 1 disorder (HCC)    Depression    Diabetes mellitus without complication (HCC)    Type 2 (Pre)   Schizophrenia (HCC)  History reviewed. No pertinent surgical history. Family History:  Family History  Problem Relation Age of Onset   Hypertension Father    Family Psychiatric  History:  See H&P  Social History:  Social History   Substance and Sexual Activity  Alcohol Use  No   Alcohol/week: 0.0 standard drinks of alcohol     Social History   Substance and Sexual Activity  Drug Use No    Social History   Socioeconomic History   Marital status: Married    Spouse name: Amadou   Number of children: Not on file   Years of education: Not on file   Highest education level: Associate degree: occupational, Scientist, product/process development, or vocational program  Occupational History   Occupation: unemployed  Tobacco Use   Smoking status: Never   Smokeless tobacco: Never  Vaping Use   Vaping status: Never Used  Substance and Sexual Activity   Alcohol use: No    Alcohol/week: 0.0 standard drinks of alcohol   Drug use: No   Sexual activity: Yes    Birth control/protection: None  Other Topics Concern   Not on file  Social History Narrative   ** Merged History Encounter **       Social Determinants of Health   Financial Resource Strain: Not on file  Food Insecurity: Patient Declined (12/28/2022)   Hunger Vital Sign    Worried About Running Out of Food in the Last Year: Patient declined    Ran Out of Food in the Last Year: Patient declined  Transportation Needs: Patient Declined (12/28/2022)   PRAPARE - Administrator, Civil Service (Medical): Patient declined    Lack of Transportation (Non-Medical): Patient declined  Physical Activity: Inactive (04/03/2019)   Exercise Vital Sign    Days of Exercise per Week: 0 days    Minutes of Exercise per Session: 0 min  Stress: Not on file  Social Connections: Socially Integrated (04/03/2019)   Social Connection and Isolation Panel [NHANES]    Frequency of Communication with Friends and Family: More than three times a week    Frequency of Social Gatherings with Friends and Family: Once a week    Attends Religious Services: More than 4 times per year    Active Member of [employer] or Organizations: Yes    Attends Engineer, structural: Not on file    Marital Status: Married   Additional Social History:  See  H&P  Sleep: Unable to assess  Appetite: Unable to assess  Current Medications: Current Facility-Administered Medications  Medication Dose Route Frequency Provider Last Rate Last Admin   acetaminophen (TYLENOL) tablet 650 mg  650 mg Oral Q6H PRN Massengill, Harrold Donath, MD   650 mg at 12/31/22 1456   alum & mag hydroxide-simeth (MAALOX/MYLANTA) 200-200-20 MG/5ML suspension 30 mL  30 mL Oral Q4H PRN Massengill, Harrold Donath, MD       cloNIDine (CATAPRES) tablet 0.1 mg  0.1 mg Oral Q6H PRN Abbott Pao, Nadir, MD       diphenhydrAMINE (BENADRYL) capsule 50 mg  50 mg Oral TID PRN Massengill, Harrold Donath, MD       Or   diphenhydrAMINE (BENADRYL) injection 50 mg  50 mg Intramuscular TID PRN Massengill, Harrold Donath, MD       haloperidol (HALDOL) tablet 5 mg  5 mg Oral TID PRN Massengill, Harrold Donath, MD       Or   haloperidol lactate (HALDOL) injection 5 mg  5 mg Intramuscular TID PRN Phineas Inches, MD  haloperidol (HALDOL) tablet 5 mg  5 mg Oral BID Starleen Blue, NP       hydrOXYzine (ATARAX) tablet 50 mg  50 mg Oral TID PRN Phineas Inches, MD   50 mg at 12/31/22 0156   LORazepam (ATIVAN) tablet 2 mg  2 mg Oral TID PRN Phineas Inches, MD       Or   LORazepam (ATIVAN) injection 2 mg  2 mg Intramuscular TID PRN Massengill, Harrold Donath, MD       magnesium hydroxide (MILK OF MAGNESIA) suspension 30 mL  30 mL Oral Daily PRN Massengill, Harrold Donath, MD       propranolol (INDERAL) tablet 10 mg  10 mg Oral BID Starleen Blue, NP   10 mg at 12/31/22 1610   simvastatin (ZOCOR) tablet 10 mg  10 mg Oral q1800 Starleen Blue, NP   10 mg at 12/30/22 1827   traZODone (DESYREL) tablet 150 mg  150 mg Oral QHS Massengill, Harrold Donath, MD   150 mg at 12/30/22 2029   Vitamin D (Ergocalciferol) (DRISDOL) 1.25 MG (50000 UNIT) capsule 50,000 Units  50,000 Units Oral Q7 days Starleen Blue, NP        Lab Results:  Results for orders placed or performed during the hospital encounter of 12/28/22 (from the past 48 hour(s))  Ammonia     Status:  None   Collection Time: 12/30/22  6:42 AM  Result Value Ref Range   Ammonia <10 9 - 35 umol/L    Comment: Performed at [facility], [address], [place], [place] [address]  VITAMIN D 25 Hydroxy (Vit-D Deficiency, Fractures)     Status: Abnormal   Collection Time: 12/30/22  6:42 AM  Result Value Ref Range   Vit D, 25-Hydroxy 20.09 (L) 30 - 100 ng/mL    Comment: (NOTE) Vitamin D deficiency has been defined by the Institute of Medicine  and an Endocrine Society practice guideline as a level of serum 25-OH  vitamin D less than 20 ng/mL (1,2). The Endocrine Society went on to  further define vitamin D insufficiency as a level between 21 and 29  ng/mL (2).  1. IOM (Institute of Medicine). 2010. Dietary reference intakes for  calcium and D. [place]: [employer]. 2. Holick MF, Binkley [place], Bischoff-Ferrari HA, et al. Evaluation,  treatment, and prevention of vitamin D deficiency: an Endocrine  Society clinical practice guideline, JCEM. 2011 Jul; 96(7): 1911-30.  Performed at [facility], [address], [place], [place] [address]   Prolactin     Status: Abnormal   Collection Time: 12/30/22  6:42 AM  Result Value Ref Range   Prolactin 34.3 (H) 4.8 - 33.4 ng/mL    Comment: (NOTE) Performed At: [facility] [address] [place], [place] [id number] Jolene Schimke MD AO:[id number]     Blood Alcohol level:  Lab Results  Component Value Date   [facility] <10 12/27/2022   ETH <10 12/11/2022    Metabolic Disorder Labs: Lab Results  Component Value Date   HGBA1C 6.2 (H) 12/11/2022   MPG 131 12/11/2022   MPG 111.15 10/23/2018   Lab Results  Component Value Date   PROLACTIN 34.3 (H) 12/30/2022   PROLACTIN 13.0 12/11/2022   Lab Results  Component Value Date   CHOL 228 (H) 12/11/2022   TRIG 122 12/11/2022   HDL 91 12/11/2022   CHOLHDL 2.5 12/11/2022   VLDL 24 12/11/2022   LDLCALC 113 (H) 12/11/2022   LDLCALC 95 10/23/2018  Physical Findings: AIMS:  , ,  ,  ,    CIWA:    COWS:     Musculoskeletal: Strength & Muscle Tone: within normal limits Gait & Station: normal Patient leans: N/A  Psychiatric Specialty Exam:  Presentation  General Appearance:  Fairly Groomed  Eye Contact: Fair  Speech: Clear and Coherent  Speech Volume: Normal  Handedness: Right   Mood and Affect  Mood: Anxious; Labile  Affect: Congruent   Thought Process  Thought Processes: Disorganized; Irrevelant  Descriptions of Associations:Tangential  Orientation:Partial  Thought Content:Illogical  History of Schizophrenia/Schizoaffective disorder:Yes  Duration of Psychotic Symptoms:Greater than six months  Hallucinations:Hallucinations: Auditory Description of Auditory Hallucinations: Mostly blocked, mute  Ideas of Reference:Delusions  Suicidal Thoughts:Suicidal Thoughts: No  Homicidal Thoughts:Homicidal Thoughts: No   Sensorium  Memory: Immediate Good  Judgment: Other (comment) (Unable to assess due to thought blocking and patient being mostly mute.)  Insight: Other (comment) (Unable to assess due to thought blocking and patient being mostly mute.)   Art therapist  Concentration: Poor  Attention Span: Fair  Recall: Poor  Fund of Knowledge: Poor  Language: Fair   Psychomotor Activity  Psychomotor Activity: Psychomotor Activity: Normal   Assets  Assets: Resilience   Sleep  Sleep: Sleep: Fair    Physical Exam: Physical Exam Constitutional:      Appearance: Normal appearance.  HENT:     Head: Normocephalic and atraumatic.     Nose: Nose normal.     Mouth/Throat:     Mouth: Mucous membranes are moist.  Eyes:     Extraocular Movements: Extraocular movements intact.  Cardiovascular:     Rate and Rhythm: Normal rate and regular rhythm.  Pulmonary:     Effort: Pulmonary effort is normal.  Abdominal:     General: Abdomen is flat.  Musculoskeletal:         General: Normal range of motion.     Cervical back: Normal range of motion.  Skin:    General: Skin is warm and dry.  Psychiatric:        Mood and Affect: Mood is depressed.        Speech: She is noncommunicative.        Behavior: Behavior is withdrawn.        Cognition and Memory: Cognition is impaired.     Comments: Mostly unable to assess due to thought blocking and patient being mute    Review of Systems  Constitutional: Negative.   HENT: Negative.    Eyes: Negative.   Respiratory: Negative.    Cardiovascular: Negative.   Gastrointestinal: Negative.   Skin: Negative.   Neurological: Negative.   Psychiatric/Behavioral:  Positive for depression and hallucinations. Negative for memory loss (Unable to assess), substance abuse and suicidal ideas (Unable to assess). The patient is nervous/anxious (Unable to assess) and has insomnia (Unable to assess).    Blood pressure 121/81, pulse 90, temperature 97.7 F (36.5 C), temperature source Oral, resp. rate (!) 24, height 5\' 8"  (1.727 m), weight 101.2 kg, SpO2 100%. Body mass index is 33.91 kg/m.   Treatment Plan Summary: Daily contact with patient to assess and evaluate symptoms and progress in treatment and Medication management  Plan:  Patient exhibited thought blocking and was mostly mute during most of the assessment.  When patient was able to communicate, patient provided bizarre responses to the questions addressed to her.  Patient to remain on her current psychiatric medication regimen.  Patient to continue to be evaluated and assessed to determine efficacy  of treatment.  Safety and Monitoring: Voluntary admission to inpatient psychiatric unit for safety, stabilization and treatment Daily contact with patient to assess and evaluate symptoms and progress in treatment Patient's case to be discussed in multi-disciplinary team meeting Observation Level : q15 minute checks Vital signs: q12 hours Precautions: Safety  Diagnosis:  Principal Problem:   Schizoaffective disorder, bipolar type (HCC) Active Problems:   Hyperlipidemia   Type II diabetes mellitus (HCC)   GAD (generalized anxiety disorder)  #Schizoaffective disorder (bipolar type) -Start Haldol 5 mg BID  -Discontinue risperidone 2 mg  BID as psychosis is persistent  Agitation protocol: -Diphenhydramine 50 mg p.o. or injection, 3 times daily as needed for agitation -Haloperidol 5 mg p.o. or injection, 3 times daily as needed for agitation -Lorazepam 2 mg p.o. or injection, 3 times daily as needed for agitation  #Generalized anxiety disorder -Continue hydroxyzine 50 mg 3 times daily as needed for anxiety -Continue propranolol 10 mg 2 times daily for anxiety  #Hyperlipidemia -Continue simvastatin 10 mg daily  Insomnia -Continue trazodone 150 mg at bedtime for the management of insomnia  Hypertension -Continue clonidine 0.1 mg every 6 hours as needed for systolic blood pressure greater than 160 or diastolic blood pressure less greater than 100  Low Vitamin D level -Start Vitamin D 50.000 units weekly  As needed medications: -Patient to continue taking Tylenol 650 mg every 6 hours as needed for mild pain -Patient to continue taking Maalox/Mylanta 30 mL every 4 hours as needed for indigestion -Patient to continue taking Milk of Magnesia 30 mL as needed for mild constipation  Labs reviewed: Ammonia level was found to be within normal limits (<10 umol/L).  Vitamin D level was found to be decreased (20.09 ng/mL), supple.menting.  Prolactin level slightly elevated at 34. [name]  Discharge Planning: Social work and case management to assist with discharge planning and identification of hospital follow-up needs prior to discharge Estimated LOS: 5-7 days Discharge Concerns: Need to establish a safety plan; Medication compliance and effectiveness Discharge Goals: Return home with outpatient referrals for mental health follow-up including medication  management/psychotherapy   I certify that inpatient services furnished can reasonably be expected to improve the patient's condition.    Starleen Blue, NP 12/31/2022, 4:13 PM Patient ID: Barbara George, female   DOB: 06-02-1996, 27 y.o.   MRN: [id number]

## 2022-12-31 NOTE — Progress Notes (Signed)
1:1 nursing note (late entry) Patient is awake and requested medication to help her rest. She has been up to the bathroom and had prayer before she got back in bed to rest and had difficulty falling back asleep. Benadryl given and 1:1 continues with sitter at bedside.

## 2022-12-31 NOTE — Progress Notes (Signed)
   12/31/22 0546  15 Minute Checks  Location Dayroom  Visual Appearance Calm  Behavior Sleeping  Sleep (Behavioral Health Patients Only)  Calculate sleep? (Click Yes once per 24 hr at 0600 safety check) Yes  Documented sleep last 24 hours 6.75

## 2022-12-31 NOTE — Group Note (Signed)
Recreation Therapy Group Note   Group Topic:Stress Management  Group Date: 12/31/2022 Start Time: 1015 End Time: 1055 Facilitators: Aanvi Voyles-McCall, LRT,CTRS Location: [address] Dayroom   Goal Area(s) Addresses:  Patient will identify positive stress management techniques. Patient will identify benefits of using stress management post d/c.     Group Description: Stress Management. LRT and patients discussed the importance of stress management.  Patients were to complete a worksheet that describing their largest source of stress in detail. Patient then needed to list two stressors they are currently experiencing and then circle the symptoms they experience in response to their stress. LRT also played music as patients completed the worksheet.   Affect/Mood: Appropriate   Participation Level: None   Participation Quality: Moderate Cues   Behavior: Nonchalant   Speech/Thought Process: Unfocused   Insight: Lacking   Judgement: Lacking    Modes of Intervention: Music and Worksheet   Patient Response to Interventions:  Avoidant   Education Outcome:  In group clarification offered    Clinical Observations/Individualized Feedback: Pt was off task and unfocused during group. Pt was deflective and disengaged.  It appeared some of patient behavior was attention seeking as well.    Plan: Continue to engage patient in RT group sessions 2-3x/week.   Shanequa Whitenight-McCall, LRT,CTRS 12/31/2022 12:15 PM

## 2022-12-31 NOTE — Group Note (Signed)
Date:  12/31/2022 Time:  6:26 PM  Group Topic/Focus:  Goals Group:   The focus of this group is to help patients establish daily goals to achieve during treatment and discuss how the patient can incorporate goal setting into their daily lives to aide in recovery. Orientation:   The focus of this group is to educate the patient on the purpose and policies of crisis stabilization and provide a format to answer questions about their admission.  The group details unit policies and expectations of patients while admitted.    Participation Level:  Did Not Attend  Additional Comments:  Patient was encouraged to attend group multiple times.   Clarine Elrod T Lorraine Lax 12/31/2022, 6:26 PM

## 2022-12-31 NOTE — Group Note (Signed)
LCSW Group Therapy Note   Group Date: 12/31/2022 Start Time: 1300 End Time: 1400    Type of Therapy and Topic:  Group Therapy:  Setting Goals  Participation Level:  None   Description of Group: In this process group, patients discussed using strengths to work toward goals and address challenges.  Patients identified two positive things about themselves and one goal they were working on.  Patients were given the opportunity to share openly and support each other's plan for self-empowerment.  The group discussed the value of gratitude and were encouraged to have a daily reflection of positive characteristics or circumstances.  Patients were encouraged to identify a plan to utilize their strengths to work on current challenges and goals.   Therapeutic Goals Patient will verbalize personal strengths/positive qualities and relate how these can assist with achieving desired personal goals Patients will verbalize affirmation of peers plans for personal change and goal setting Patients will explore the value of gratitude and positive focus as related to successful achievement of goals Patients will verbalize a plan for regular reinforcement of personal positive qualities and circumstances.   Summary of Patient Progress: Patient did not identify any goals during group. Patient marked lines through her paper  and shared that she did not have any goals for family, friend , work/school, spirituality, body, mental health. Patient did remain in group until it was over.      Therapeutic Modalities Cognitive Behavioral Therapy Motivational Interviewing   Isabella Bowens, [place] 12/31/2022  3:30 PM

## 2022-12-31 NOTE — Plan of Care (Signed)
  Problem: Education: Goal: Emotional status will improve Outcome: Not Progressing Goal: Mental status will improve Outcome: Not Progressing   Problem: Activity: Goal: Interest or engagement in activities will improve Outcome: Not Progressing   

## 2022-12-31 NOTE — BH IP Treatment Plan (Addendum)
Interdisciplinary Treatment and Diagnostic Plan New  12/31/2022 Time of Session: 1030am Leiliana Midou Hamadou MRN: [id number]  Principal Diagnosis: Schizoaffective disorder, bipolar type (HCC)  Secondary Diagnoses: Principal Problem:   Schizoaffective disorder, bipolar type (HCC) Active Problems:   Hyperlipidemia   Type II diabetes mellitus (HCC)   GAD (generalized anxiety disorder)   Current Medications:  Current Facility-Administered Medications  Medication Dose Route Frequency Provider Last Rate Last Admin   acetaminophen (TYLENOL) tablet 650 mg  650 mg Oral Q6H PRN Massengill, Harrold Donath, MD       alum & mag hydroxide-simeth (MAALOX/MYLANTA) 200-200-20 MG/5ML suspension 30 mL  30 mL Oral Q4H PRN Massengill, Nathan, MD       cloNIDine (CATAPRES) tablet 0.1 mg  0.1 mg Oral Q6H PRN Abbott Pao, Nadir, MD       diphenhydrAMINE (BENADRYL) capsule 50 mg  50 mg Oral TID PRN Massengill, Harrold Donath, MD       Or   diphenhydrAMINE (BENADRYL) injection 50 mg  50 mg Intramuscular TID PRN Massengill, Harrold Donath, MD       haloperidol (HALDOL) tablet 5 mg  5 mg Oral TID PRN Phineas Inches, MD       Or   haloperidol lactate (HALDOL) injection 5 mg  5 mg Intramuscular TID PRN Massengill, Harrold Donath, MD       hydrOXYzine (ATARAX) tablet 50 mg  50 mg Oral TID PRN Phineas Inches, MD   50 mg at 12/31/22 0156   LORazepam (ATIVAN) tablet 2 mg  2 mg Oral TID PRN Phineas Inches, MD       Or   LORazepam (ATIVAN) injection 2 mg  2 mg Intramuscular TID PRN Massengill, Harrold Donath, MD       magnesium hydroxide (MILK OF MAGNESIA) suspension 30 mL  30 mL Oral Daily PRN Massengill, Harrold Donath, MD       propranolol (INDERAL) tablet 10 mg  10 mg Oral BID Starleen Blue, NP   10 mg at 12/31/22 4098   risperiDONE (RISPERDAL M-TABS) disintegrating tablet 2 mg  2 mg Oral BID Starleen Blue, NP   2 mg at 12/31/22 1191   simvastatin (ZOCOR) tablet 10 mg  10 mg Oral q1800 Starleen Blue, NP   10 mg at 12/30/22 1827   traZODone  (DESYREL) tablet 150 mg  150 mg Oral QHS Massengill, Harrold Donath, MD   150 mg at 12/30/22 2029   PTA Medications: Medications Prior to Admission  Medication Sig Dispense Refill Last Dose   ARIPiprazole (ABILIFY) 10 MG tablet Take 1 tablet (10 mg total) by mouth as directed for 15 doses. Taper off oral abilify as follows: Take 2 tablets once daily for 5 days. Then decrease to taking 1 tablet once daily for 5 days. Then stop. 15 tablet 0    [START ON 01/15/2023] ARIPiprazole ER (ABILIFY MAINTENA) 400 MG SRER injection Inject 2 mLs (400 mg total) into the muscle every 28 (twenty-eight) days for 1 dose. Next dose is due on 01-15-23. 1 each 0    hydrOXYzine (ATARAX) 50 MG tablet Take 1 tablet (50 mg total) by mouth 3 (three) times daily as needed for anxiety. 30 tablet 0    risperiDONE (RISPERDAL M-TABS) 1 MG disintegrating tablet Take 1 tablet (1 mg total) by mouth at bedtime. 30 tablet 0    simvastatin (ZOCOR) 10 MG tablet Take 1 tablet (10 mg total) by mouth daily at 6 PM for 14 days. 14 tablet 0    traZODone (DESYREL) 150 MG tablet Take 1 tablet (150 mg total) by  mouth at bedtime. 30 tablet 0     Patient Stressors:    Patient Strengths:    Treatment Modalities: Medication Management, Group therapy, Case management,  1 to 1 session with clinician, Psychoeducation, Recreational therapy.   Physician Treatment Plan for Primary Diagnosis: Schizoaffective disorder, bipolar type (HCC) Long Term Goal(s): Improvement in symptoms so as ready for discharge   Short Term Goals: Ability to identify changes in lifestyle to reduce recurrence of condition will improve Ability to verbalize feelings will improve Ability to disclose and discuss suicidal ideas Ability to demonstrate self-control will improve Ability to identify and develop effective coping behaviors will improve Ability to maintain clinical measurements within normal limits will improve Compliance with prescribed medications will  improve  Medication Management: Evaluate patient's response, side effects, and tolerance of medication regimen.  Therapeutic Interventions: 1 to 1 sessions, Unit Group sessions and Medication administration.  Evaluation of Outcomes: Not Progressing  Physician Treatment Plan for Secondary Diagnosis: Principal Problem:   Schizoaffective disorder, bipolar type (HCC) Active Problems:   Hyperlipidemia   Type II diabetes mellitus (HCC)   GAD (generalized anxiety disorder)  Long Term Goal(s): Improvement in symptoms so as ready for discharge   Short Term Goals: Ability to identify changes in lifestyle to reduce recurrence of condition will improve Ability to verbalize feelings will improve Ability to disclose and discuss suicidal ideas Ability to demonstrate self-control will improve Ability to identify and develop effective coping behaviors will improve Ability to maintain clinical measurements within normal limits will improve Compliance with prescribed medications will improve     Medication Management: Evaluate patient's response, side effects, and tolerance of medication regimen.  Therapeutic Interventions: 1 to 1 sessions, Unit Group sessions and Medication administration.  Evaluation of Outcomes: Not Progressing   RN Treatment Plan for Primary Diagnosis: Schizoaffective disorder, bipolar type (HCC) Long Term Goal(s): Knowledge of disease and therapeutic regimen to maintain health will improve  Short Term Goals: Ability to remain free from injury will improve, Ability to verbalize frustration and anger appropriately will improve, Ability to demonstrate self-control, Ability to participate in decision making will improve, Ability to verbalize feelings will improve, Ability to disclose and discuss suicidal ideas, Ability to identify and develop effective coping behaviors will improve, and Compliance with prescribed medications will improve  Medication Management: RN will administer  medications as ordered by provider, will assess and evaluate patient's response and provide education to patient for prescribed medication. RN will report any adverse and/or side effects to prescribing provider.  Therapeutic Interventions: 1 on 1 counseling sessions, Psychoeducation, Medication administration, Evaluate responses to treatment, Monitor vital signs and CBGs as ordered, Perform/monitor CIWA, COWS, AIMS and Fall Risk screenings as ordered, Perform wound care treatments as ordered.  Evaluation of Outcomes: Not Progressing   LCSW Treatment Plan for Primary Diagnosis: Schizoaffective disorder, bipolar type (HCC) Long Term Goal(s): Safe transition to appropriate next level of care at discharge, Engage patient in therapeutic group addressing interpersonal concerns.  Short Term Goals: Engage patient in aftercare planning with referrals and resources, Increase social support, Increase ability to appropriately verbalize feelings, Increase emotional regulation, Facilitate acceptance of mental health diagnosis and concerns, Facilitate patient progression through stages of change regarding substance use diagnoses and concerns, Identify triggers associated with mental health/substance abuse issues, and Increase skills for wellness and recovery  Therapeutic Interventions: Assess for all discharge needs, 1 to 1 time with Social worker, Explore available resources and support systems, Assess for adequacy in community support network, Educate family and significant  other(s) on suicide prevention, Complete Psychosocial Assessment, Interpersonal group therapy.  Evaluation of Outcomes: Not Progressing   Progress in Treatment: Attending groups: Yes. Participating in groups: No. Taking medication as prescribed: Yes. Toleration medication: Yes. Family/Significant other contact made: No, will contact:  once consent is signed Patient understands diagnosis: No. Discussing patient identified problems/goals  with staff: No. Medical problems stabilized or resolved: No. Denies suicidal/homicidal ideation: Yes. Issues/concerns per patient self-inventory: No. Other: none reported  New problem(s) identified: No, Describe:  none reported  New Short Term/Long Term Goal(s): medication stabilization, elimination of SI thoughts and or depression   Patient Goals:  "Help with depression"  Discharge Plan or Barriers: Patient recently admitted. CSW will continue to follow and assess for appropriate referrals and possible discharge planning.      Reason for Continuation of Hospitalization: Anxiety Delusions  Depression Mania Medication stabilization  Estimated Length of Stay:  Last 3 [place] Suicide Severity Risk Score: Flowsheet Row [facility] Most recent reading at 12/28/2022  5:00 PM Admission (Discharged) [facility] Most recent reading at 12/11/2022  9:27 PM ED from 12/11/2022 in [facility] Most recent reading at 12/11/2022 11:51 AM  C-SSRS RISK CATEGORY No Risk No Risk No Risk       Last PHQ 2/9 Scores:    12/10/2022    8:03 AM 09/17/2022    1:18 PM 07/24/2021    3:15 PM  Depression screen PHQ 2/9  Decreased Interest 0 1 0  Down, Depressed, Hopeless 3 0 0  PHQ - 2 Score 3 1 0  Altered sleeping 3 0   Tired, decreased energy 1 1   Change in appetite 1 0   Feeling bad or failure about yourself  1 0   Trouble concentrating 2 0   Moving slowly or fidgety/restless 2 0   Suicidal thoughts 0 0   PHQ-9 Score 13 2   Difficult doing work/chores Very difficult      Scribe for Treatment Team: Charise Killian 12/31/2022 12:22 PM

## 2023-01-01 DIAGNOSIS — F250 Schizoaffective disorder, bipolar type: Secondary | ICD-10-CM | POA: Diagnosis not present

## 2023-01-01 MED ORDER — HALOPERIDOL LACTATE 2 MG/ML PO CONC
5.0000 mg | Freq: Two times a day (BID) | ORAL | Status: DC
  Administered 2023-01-01 – 2023-01-02 (×2): 5 mg via ORAL
  Filled 2023-01-01 (×8): qty 5

## 2023-01-01 NOTE — Progress Notes (Signed)
Patient standing in hallway. Dropped her pants and refused to pull them back up. Patient then kicked her pants across the floor in the hallway and refused to pick them up. Patient redirected back to her room.

## 2023-01-01 NOTE — Progress Notes (Signed)
Adult Psychoeducational Group Note  Date:  01/01/2023 Time:  10:16 AM  Group Topic/Focus:  Goals Group:   The focus of this group is to help patients establish daily goals to achieve during treatment and discuss how the patient can incorporate goal setting into their daily lives to aide in recovery.  Participation Level:  Active  Participation Quality:  Appropriate  Affect:  Appropriate  Cognitive:  Appropriate  Insight: Appropriate  Engagement in Group:  Engaged  Modes of Intervention:  Discussion  Additional Comments:The patient attended group  Barbara George 01/01/2023, 10:16 AM

## 2023-01-01 NOTE — Progress Notes (Signed)
Patient attempted to leave the unit at dinnertime with the other patients, but was able to be redirected back onto the unit. Patient calm at this time. 1:1 monitoring continues. Patient remains safe at this time.

## 2023-01-01 NOTE — Progress Notes (Signed)
Nursing 1:1 note D:Pt observed sleeping in bed with eyes closed. RR even and unlabored. No distress noted. A: 1:1 observation continues for safety  R: Pt remains safe  

## 2023-01-01 NOTE — Plan of Care (Signed)
  Problem: Activity: Goal: Sleeping patterns will improve Outcome: Progressing   Problem: Health Behavior/Discharge Planning: Goal: Compliance with treatment plan for underlying cause of condition will improve Outcome: Progressing   Problem: Coping: Goal: Ability to demonstrate self-control will improve Outcome: Not Progressing

## 2023-01-01 NOTE — Progress Notes (Signed)
1:1 Nursing note   Patient sleeping. Remains on 1:1 for safety. No distress noted. Patient remains safe.

## 2023-01-01 NOTE — Progress Notes (Signed)
1:1 Nursing note  Patient in the dayroom getting her vital signs taken.  1:1 continued for safety. Patient remains safe.

## 2023-01-01 NOTE — Progress Notes (Signed)
   01/01/23 2121  Psych Admission Type (Psych Patients Only)  Admission Status Involuntary  Psychosocial Assessment  Patient Complaints None  Eye Contact Brief  Facial Expression Anxious  Affect Blunted  Speech Soft  Interaction Childlike  Motor Activity Other (Comment) (wnl)  Appearance/Hygiene Unremarkable  Behavior Characteristics Cooperative;Impulsive  Mood Preoccupied  Thought Process  Coherency Blocking;Disorganized  Content UTA  Delusions None reported or observed  Perception UTA  Hallucination None reported or observed  Judgment Impaired  Confusion UTA  Danger to Self  Current suicidal ideation? Passive  Self-Injurious Behavior Some self-injurious ideation observed or expressed.  No lethal plan expressed   Agreement Not to Harm Self Yes  Description of Agreement verbal  Danger to Others  Danger to Others None reported or observed   Progress note   D: Pt seen in dayroom with sitter. Pt denies HI, AVH. Endorses SI but doesn't elaborate on a plan. Pt electively mute at times during assessment. Pt rates pain  0/10. Pt rates anxiety  0/10 and depression  0/10. Pt appears to be preoccupied. Childlike behavior at times. No other concerns noted at this time.  A: Pt provided support and encouragement. Pt given scheduled medication as prescribed. Pt watched for cheeking. Pt sat in dayroom with sitter for 20 minutes after meds given. PRNs as appropriate. Q15 min checks for safety.   R: Pt safe on the unit. Will continue to monitor.

## 2023-01-01 NOTE — Group Note (Signed)
Recreation Therapy Group Note   Group Topic:Leisure Education  Group Date: 01/01/2023 Start Time: 1050 End Time: 1130 Facilitators: Arris Meyn-McCall, LRT,CTRS Location: 500 Hall Dayroom   Goal Area(s) Addresses:  Patient will successfully identify benefits of leisure participation. Patient will successfully identify ways to access leisure activities. Patient will successfully identify leisure activities.  Group Description: Leisure Science writer. Patients were to group up in groups of 2-4 and create their ideal recreation/community center. Patients were to create a name of the facility, identify hours of operation, what activities will be offered, age group programs are geared to and how often programs will be offered.   Affect/Mood: Flat   Participation Level: None   Participation Quality: None   Behavior: Passive   Speech/Thought Process: None   Insight: None   Judgement: None   Modes of Intervention: Group work   Patient Response to Interventions:  Disengaged   Education Outcome:  In group clarification offered    Clinical Observations/Individualized Feedback: Pt didn't participate in group session. Pt sat in the corner under the tv. Pt had her eyes closed most of the time in group. Pt was seen moving along to one of the songs during group.     Plan: Continue to engage patient in RT group sessions 2-3x/week.   Orla Estrin-McCall, LRT,CTRS  01/01/2023 12:33 PM

## 2023-01-01 NOTE — Progress Notes (Signed)
[facility] MD Progress Note  01/01/2023 4:40 PM Barbara George  MRN:  [id number]  Reason For Admission: Barbara George is a 27 year old African-American female with a past psychiatric history significant for schizoaffective disorder (bipolar type) who was admitted to [facility] from [facility], ED after presenting to the ED on 7/25 by her husband and father because she stopped eating, stopped talking, and became more withdrawn the day prior.  Per patient's father, history of nonmedication compliance and patient typically after discharge, which always leads to a relapse in her symptoms.  Patient was already receiving transferred back to [facility] under IVC for treatment and stabilization of her mental status.  24 hr chart review: Sleep Hours last night: Nursing Concerns: Remains on one-on-one staff monitoring, due to sexually inappropriate behaviors. Behavioral episodes in the past 24 hrs: manic as per nursing staff Medication Compliance: Attempted to spit medications out during med pass Vital Signs in the past 24 hrs: HR persistently elevated since yesterday PRN Medications in the past 24 hrs: Agitation protocol medications today afternoon (Ativan 2 mg, Haldol 5 mg, Benadryl 50 mg p.o. at 1:30 PM today).  Patient assessment note:  Presentation today is bizarre; patient is disorganized, nonlinear in her response to questions, asked questions regarding suicidal ideations, and responds "I am using the broom right now".  She is selectively mute at times and just stares at [employer].  She presents with perseverations, and tangentiality; talks about being Muslim, states that if she were "[place], the Muslims will win, but I don't want to be hit."  She rambles at times in her native dialect, ranging if in the interpreter not being able to comprehend what she is saying.  Patient reports auditory hallucinations of voices telling her "the truth", does not elaborate further on this,  denies visual hallucinations, denies paranoia.  As per patient's assigned RN, patient is sexually inappropriate on the unit, pulls down her pants at times, trying to review her private areas.  She denies SI/HI.  Risperdal was discontinued yesterday, patient was started on Haldol 5 mg twice daily, but her assigned RN is reporting that manic type symptoms today are worse and there are suspicions that patient has been cheeking her medications, as there were some difficulties getting pt to take medications today. Staff educated to lock pt out of her room for at least an hour after medications and ensure that mouth is checked after meds.  Heart rate was elevated earlier today morning, nursing asked to recheck.  Switching Haldol to liquid Haldol to ensure medication compliance. No TD/EPS type symptoms found on assessment, and pt denies any feelings of stiffness. AIMS: 0.   Principal Problem: Schizoaffective disorder, bipolar type (HCC) Diagnosis: Principal Problem:   Schizoaffective disorder, bipolar type (HCC) Active Problems:   Hyperlipidemia   Type II diabetes mellitus (HCC)   GAD (generalized anxiety disorder)  Total Time spent with patient: 35 minutes   Past Psychiatric History:  See H&P  Past Medical History:  Past Medical History:  Diagnosis Date   Bipolar 1 disorder (HCC)    Depression    Diabetes mellitus without complication (HCC)    Type 2 (Pre)   Schizophrenia (HCC)    History reviewed. No pertinent surgical history. Family History:  Family History  Problem Relation Age of Onset   Hypertension Father    Family Psychiatric  History:  See H&P  Social History:  Social History   Substance and Sexual Activity  Alcohol Use No   Alcohol/week:  0.0 standard drinks of alcohol     Social History   Substance and Sexual Activity  Drug Use No    Social History   Socioeconomic History   Marital status: Married    Spouse name: Amadou   Number of children: Not on file    Years of education: Not on file   Highest education level: Associate degree: occupational, Scientist, product/process development, or vocational program  Occupational History   Occupation: unemployed  Tobacco Use   Smoking status: Never   Smokeless tobacco: Never  Vaping Use   Vaping status: Never Used  Substance and Sexual Activity   Alcohol use: No    Alcohol/week: 0.0 standard drinks of alcohol   Drug use: No   Sexual activity: Yes    Birth control/protection: None  Other Topics Concern   Not on file  Social History Narrative   ** Merged History Encounter **       Social Determinants of Health   Financial Resource Strain: Not on file  Food Insecurity: Patient Declined (12/28/2022)   Hunger Vital Sign    Worried About Running Out of Food in the Last Year: Patient declined    Ran Out of Food in the Last Year: Patient declined  Transportation Needs: Patient Declined (12/28/2022)   PRAPARE - Administrator, Civil Service (Medical): Patient declined    Lack of Transportation (Non-Medical): Patient declined  Physical Activity: Inactive (04/03/2019)   Exercise Vital Sign    Days of Exercise per Week: 0 days    Minutes of Exercise per Session: 0 min  Stress: Not on file  Social Connections: Socially Integrated (04/03/2019)   Social Connection and Isolation Panel [NHANES]    Frequency of Communication with Friends and Family: More than three times a week    Frequency of Social Gatherings with Friends and Family: Once a week    Attends Religious Services: More than 4 times per year    Active Member of [employer] or Organizations: Yes    Attends Engineer, structural: Not on file    Marital Status: Married   Additional Social History:  See H&P  Sleep: Unable to assess  Appetite: Unable to assess  Current Medications: Current Facility-Administered Medications  Medication Dose Route Frequency Provider Last Rate Last Admin   acetaminophen (TYLENOL) tablet 650 mg  650 mg Oral Q6H PRN  Massengill, Harrold Donath, MD   650 mg at 12/31/22 1456   alum & mag hydroxide-simeth (MAALOX/MYLANTA) 200-200-20 MG/5ML suspension 30 mL  30 mL Oral Q4H PRN Massengill, Harrold Donath, MD       cloNIDine (CATAPRES) tablet 0.1 mg  0.1 mg Oral Q6H PRN Abbott Pao, Nadir, MD       diphenhydrAMINE (BENADRYL) capsule 50 mg  50 mg Oral TID PRN Phineas Inches, MD   50 mg at 01/01/23 1330   Or   diphenhydrAMINE (BENADRYL) injection 50 mg  50 mg Intramuscular TID PRN Massengill, Harrold Donath, MD       haloperidol (HALDOL) tablet 5 mg  5 mg Oral TID PRN Phineas Inches, MD   5 mg at 01/01/23 1329   Or   haloperidol lactate (HALDOL) injection 5 mg  5 mg Intramuscular TID PRN Massengill, Harrold Donath, MD       haloperidol (HALDOL) tablet 5 mg  5 mg Oral BID Starleen Blue, NP   5 mg at 01/01/23 0801   hydrOXYzine (ATARAX) tablet 50 mg  50 mg Oral TID PRN Phineas Inches, MD   50 mg at 01/01/23 [phone]  LORazepam (ATIVAN) tablet 2 mg  2 mg Oral TID PRN Phineas Inches, MD   2 mg at 01/01/23 1330   Or   LORazepam (ATIVAN) injection 2 mg  2 mg Intramuscular TID PRN Massengill, Harrold Donath, MD       magnesium hydroxide (MILK OF MAGNESIA) suspension 30 mL  30 mL Oral Daily PRN Massengill, Harrold Donath, MD       propranolol (INDERAL) tablet 10 mg  10 mg Oral BID Starleen Blue, NP   10 mg at 01/01/23 0802   simvastatin (ZOCOR) tablet 10 mg  10 mg Oral q1800 Starleen Blue, NP   10 mg at 12/31/22 1807   traZODone (DESYREL) tablet 150 mg  150 mg Oral QHS Massengill, Harrold Donath, MD   150 mg at 12/31/22 2045   Vitamin D (Ergocalciferol) (DRISDOL) 1.25 MG (50000 UNIT) capsule 50,000 Units  50,000 Units Oral Q7 days Starleen Blue, NP   50,000 Units at 01/01/23 0945    Lab Results:  No results found for this or any previous visit (from the past 48 hour(s)).   Blood Alcohol level:  Lab Results  Component Value Date   ETH <10 12/27/2022   ETH <10 12/11/2022    Metabolic Disorder Labs: Lab Results  Component Value Date   HGBA1C 6.2 (H)  12/11/2022   MPG 131 12/11/2022   MPG 111.15 10/23/2018   Lab Results  Component Value Date   PROLACTIN 34.3 (H) 12/30/2022   PROLACTIN 13.0 12/11/2022   Lab Results  Component Value Date   CHOL 228 (H) 12/11/2022   TRIG 122 12/11/2022   HDL 91 12/11/2022   CHOLHDL 2.5 12/11/2022   VLDL 24 12/11/2022   LDLCALC 113 (H) 12/11/2022   LDLCALC 95 10/23/2018   Physical Findings: AIMS: 0 ,    CIWA:    COWS:     Musculoskeletal: Strength & Muscle Tone: within normal limits Gait & Station: normal Patient leans: N/A  Psychiatric Specialty Exam:  Presentation  General Appearance:  Fairly Groomed  Eye Contact: Good  Speech: Blocked  Speech Volume: Decreased  Handedness: Right   Mood and Affect  Mood: Anxious  Affect: Inappropriate  Thought Process  Thought Processes: Disorganized  Descriptions of Associations:Tangential  Orientation:Partial  Thought Content:Illogical  History of Schizophrenia/Schizoaffective disorder:Yes  Duration of Psychotic Symptoms:Greater than six months  Hallucinations:Hallucinations: Auditory  Ideas of Reference:Delusions  Suicidal Thoughts:Suicidal Thoughts: No  Homicidal Thoughts:Homicidal Thoughts: No  Sensorium  Memory: Immediate Good  Judgment: Poor  Insight: Poor   Executive Functions  Concentration: Poor  Attention Span: Poor  Recall: Poor  Fund of Knowledge: Poor  Language: Fair   Psychomotor Activity  Psychomotor Activity: Psychomotor Activity: Normal  Assets  Assets: Resilience; Social Support   Sleep  Sleep: Sleep: Fair  Physical Exam: Physical Exam Constitutional:      Appearance: Normal appearance.  HENT:     Head: Normocephalic and atraumatic.     Nose: Nose normal.     Mouth/Throat:     Mouth: Mucous membranes are moist.  Eyes:     Extraocular Movements: Extraocular movements intact.  Cardiovascular:     Rate and Rhythm: Normal rate and regular rhythm.   Pulmonary:     Effort: Pulmonary effort is normal.  Abdominal:     General: Abdomen is flat.  Musculoskeletal:        General: Normal range of motion.     Cervical back: Normal range of motion.  Skin:    General: Skin is warm and dry.  Psychiatric:        Mood and Affect: Mood is depressed.        Speech: She is noncommunicative.        Behavior: Behavior is withdrawn.        Cognition and Memory: Cognition is impaired.     Comments: Mostly unable to assess due to thought blocking and patient being mute    Review of Systems  Constitutional: Negative.   HENT: Negative.    Eyes: Negative.   Respiratory: Negative.    Cardiovascular: Negative.   Gastrointestinal: Negative.   Skin: Negative.   Neurological: Negative.   Psychiatric/Behavioral:  Positive for depression and hallucinations. Negative for memory loss (Unable to assess), substance abuse and suicidal ideas (Unable to assess). The patient is nervous/anxious (Unable to assess) and has insomnia (Unable to assess).    Blood pressure (!) 106/94, pulse (!) 122, temperature (!) 97 F (36.1 C), temperature source Oral, resp. rate (!) 24, height 5\' 8"  (1.727 m), weight 101.2 kg, SpO2 100%. Body mass index is 33.91 kg/m.   Treatment Plan Summary: Daily contact with patient to assess and evaluate symptoms and progress in treatment and Medication management  Plan:  Patient exhibited thought blocking and was mostly mute during most of the assessment.  When patient was able to communicate, patient provided bizarre responses to the questions addressed to her.  Patient to remain on her current psychiatric medication regimen.  Patient to continue to be evaluated and assessed to determine efficacy of treatment.  Safety and Monitoring: Voluntary admission to inpatient psychiatric unit for safety, stabilization and treatment Daily contact with patient to assess and evaluate symptoms and progress in treatment Patient's case to be discussed  in multi-disciplinary team meeting Observation Level : q15 minute checks Vital signs: q12 hours Precautions: Safety  Diagnosis: Principal Problem:   Schizoaffective disorder, bipolar type (HCC) Active Problems:   Hyperlipidemia   Type II diabetes mellitus (HCC)   GAD (generalized anxiety disorder)  #Schizoaffective disorder (bipolar type) -Continue Haldol 5 mg BID, switch to liquid to ensure compliaance -Discontinue risperidone 2 mg  BID as psychosis is persistent  Agitation protocol: -Diphenhydramine 50 mg p.o. or injection, 3 times daily as needed for agitation -Haloperidol 5 mg p.o. or injection, 3 times daily as needed for agitation -Lorazepam 2 mg p.o. or injection, 3 times daily as needed for agitation  #Generalized anxiety disorder -Continue hydroxyzine 50 mg 3 times daily as needed for anxiety -Continue propranolol 10 mg 2 times daily for anxiety  #Hyperlipidemia -Continue simvastatin 10 mg daily  Insomnia -Continue trazodone 150 mg at bedtime for the management of insomnia  Hypertension -Continue clonidine 0.1 mg every 6 hours as needed for systolic blood pressure greater than 160 or diastolic blood pressure less greater than 100  Low Vitamin D level -Continue Vitamin D 50.000 units weekly  As needed medications: -Patient to continue taking Tylenol 650 mg every 6 hours as needed for mild pain -Patient to continue taking Maalox/Mylanta 30 mL every 4 hours as needed for indigestion -Patient to continue taking Milk of Magnesia 30 mL as needed for mild constipation  Labs reviewed: Ammonia level was found to be within normal limits (<10 umol/L).  Vitamin D level was found to be decreased (20.09 ng/mL), supple.menting.  Prolactin level slightly elevated at 34. [name]  Discharge Planning: Social work and case management to assist with discharge planning and identification of hospital follow-up needs prior to discharge Estimated LOS: 5-7 days Discharge Concerns: Need to  establish  a safety plan; Medication compliance and effectiveness Discharge Goals: Return home with outpatient referrals for mental health follow-up including medication management/psychotherapy   I certify that inpatient services furnished can reasonably be expected to improve the patient's condition.    Starleen Blue, NP 01/01/2023, 4:40 PM Patient ID: Barbara George, female   DOB: 10-17-1995, 27 y.o.   MRN: [id number]

## 2023-01-01 NOTE — Group Note (Signed)
Date:  01/01/2023 Time:  8:55 PM  Group Topic/Focus:  Wrap-Up Group:   The focus of this group is to help patients review their daily goal of treatment and discuss progress on daily workbooks.    Participation Level:  Active  Participation Quality:  Appropriate  Affect:  Appropriate  Cognitive:  Appropriate  Insight: Appropriate  Engagement in Group:  Engaged  Modes of Intervention:  Education and Exploration  Additional Comments:  Patient attended and participated in group tonight. She reports that he goal was to have peace. She did accomplish her goal.  Scot Dock 01/01/2023, 8:55 PM

## 2023-01-01 NOTE — Plan of Care (Signed)
  Problem: Education: Goal: Knowledge of [facility] Education information/materials will improve Outcome: Not Progressing Goal: Emotional status will improve Outcome: Not Progressing Goal: Mental status will improve Outcome: Not Progressing Goal: Verbalization of understanding the information provided will improve Outcome: Not Progressing   Problem: Activity: Goal: Interest or engagement in activities will improve Outcome: Not Progressing Goal: Sleeping patterns will improve Outcome: Not Progressing   Problem: Coping: Goal: Ability to demonstrate self-control will improve Outcome: Not Progressing

## 2023-01-01 NOTE — Progress Notes (Signed)
Patient pacing the halls and becoming intrusive with other patients. Patient verbally aggressive and argumentative. Increasing agitation. Verbal deescalation attempted but unsuccessful. Agitation protocol administered. No adverse interactions noted. 1:1 monitoring continues. Patient remains safe at this time.

## 2023-01-01 NOTE — Progress Notes (Signed)
1:1 Note: Patient denies SI/HI/AVH. Patient in the hallway dropping her pants. Patient escorted into her room and instructed to dress. Patient intrusive with other patients requiring multiple redirections. 1:1 monitoring still in place. Patient remains safe at this time.

## 2023-01-02 DIAGNOSIS — F250 Schizoaffective disorder, bipolar type: Secondary | ICD-10-CM | POA: Diagnosis not present

## 2023-01-02 MED ORDER — HALOPERIDOL LACTATE 2 MG/ML PO CONC
10.0000 mg | Freq: Two times a day (BID) | ORAL | Status: DC
  Administered 2023-01-02 – 2023-01-04 (×5): 10 mg via ORAL
  Filled 2023-01-02 (×10): qty 5

## 2023-01-02 MED ORDER — BENZTROPINE MESYLATE 0.5 MG PO TABS
0.5000 mg | ORAL_TABLET | Freq: Two times a day (BID) | ORAL | Status: DC
  Administered 2023-01-02 – 2023-01-04 (×4): 0.5 mg via ORAL
  Filled 2023-01-02 (×9): qty 1

## 2023-01-02 NOTE — Group Note (Signed)
Recreation Therapy Group Note   Group Topic:Other  Group Date: 01/02/2023 Start Time: 1020 End Time: 1050 Facilitators: Javonte Elenes-McCall, LRT,CTRS Location: [address] Dayroom   Goal Area(s) Addresses:  Patient will identify positive leisure and recreation activities.  Patient will identify one positive benefit of participation in leisure activities.    Group Description: Music Trivia. Patients were divided in to 2 groups for game play. LRT read song lyrics from songs in the  1990s and 2000s hip hop and R&B from a playing card. Each team takes turns answering the question. If they are unable to answer the question, the other team gets the chance to steal the point. The team with the most points wins the game.    Affect/Mood: Anxious   Participation Level: Minimal   Participation Quality: Maximum Cues   Behavior: Disruptive and Impulsive   Speech/Thought Process: Distracted   Insight: Impaired   Judgement: Impaired   Modes of Intervention: Competitive Play   Patient Response to Interventions:  Disruptive   Education Outcome:  In group clarification offered    Clinical Observations/Individualized Feedback: Pt was disruptive. Pt would get up and start humming and singing. Pt needed multiple prompts to have a seat. Pt appeared to want to be the center of attention. Pt wasn't making any sense when she spoke.    Plan: Continue to engage patient in RT group sessions 2-3x/week.   Barbara George A Barbara George, NT,  01/02/2023 12:58 PM

## 2023-01-02 NOTE — Progress Notes (Signed)
Nursing 1:1 note- Patient lying quietly in bed at this time, no distress noted. 1:1 observation maintained. Safety precautions maintained.

## 2023-01-02 NOTE — Progress Notes (Signed)
Nursing 1:1 note D:Pt observed sleeping in bed with eyes closed. RR even and unlabored. No distress noted. A: 1:1 observation continues for safety  R: Pt remains safe  

## 2023-01-02 NOTE — Progress Notes (Signed)
   01/02/23 2045  Psych Admission Type (Psych Patients Only)  Admission Status Involuntary  Psychosocial Assessment  Patient Complaints None  Eye Contact Brief  Facial Expression Flat  Affect Flat  Speech Soft  Interaction Guarded;Childlike  Motor Activity Other (Comment) (wnl)  Appearance/Hygiene Unremarkable  Behavior Characteristics Cooperative;Impulsive  Mood Preoccupied  Thought Process  Coherency Blocking  Content WDL  Delusions None reported or observed  Perception WDL  Hallucination None reported or observed  Judgment Impaired  Confusion None  Danger to Self  Current suicidal ideation? Denies  Agreement Not to Harm Self Yes  Description of Agreement verbal  Danger to Others  Danger to Others None reported or observed   Progress note   D: Pt seen in her room with sitter. Pt denies SI, HI, AVH. Pt rates pain  0/10. Pt rates anxiety  0/10 and depression  0/10. Pt has flat affect. Appears subdued but answering questions appropriately. No other concerns noted at this time.  A: Pt provided support and encouragement. Pt given scheduled medication as prescribed. PRNs as appropriate. Q15 min checks for safety.   R: Pt safe on the unit. Will continue to monitor.

## 2023-01-02 NOTE — Plan of Care (Signed)
  Problem: Education: Goal: Emotional status will improve Outcome: Progressing Goal: Mental status will improve Outcome: Progressing   Problem: Activity: Goal: Interest or engagement in activities will improve Outcome: Progressing Goal: Sleeping patterns will improve Outcome: Progressing   Problem: Coping: Goal: Ability to verbalize frustrations and anger appropriately will improve Outcome: Progressing   Problem: Physical Regulation: Goal: Ability to maintain clinical measurements within normal limits will improve Outcome: Progressing   Problem: Safety: Goal: Periods of time without injury will increase Outcome: Progressing   Problem: Health Behavior/Discharge Planning: Goal: Compliance with prescribed medication regimen will improve Outcome: Progressing   Problem: Nutritional: Goal: Ability to achieve adequate nutritional intake will improve Outcome: Progressing

## 2023-01-02 NOTE — Plan of Care (Signed)
  Problem: Safety: Goal: Periods of time without injury will increase Outcome: Progressing   Problem: Nutritional: Goal: Ability to achieve adequate nutritional intake will improve Outcome: Progressing   Problem: Activity: Goal: Interest or engagement in activities will improve Outcome: Progressing

## 2023-01-02 NOTE — Progress Notes (Signed)
Nursing 1:1 note D:Pt observed sitting in dayroom. RR even and unlabored. No distress noted. A: 1:1 observation continues for safety  R: Pt remains safe

## 2023-01-02 NOTE — Progress Notes (Signed)
Patient noted to agitated and irritable. Yelling and rolling around  on floor in her room. Patient observed to be intrusive and flirting with other patients. Difficult to redirect at this time. Agitation protocol po administered as ordered. Patient initially resistant to accepting medication, then agreed. 1:1 observation maintained. Safety maintained.

## 2023-01-02 NOTE — Progress Notes (Signed)
   01/02/23 0810  Psych Admission Type (Psych Patients Only)  Admission Status Involuntary  Psychosocial Assessment  Patient Complaints None  Eye Contact Avoids  Facial Expression Flat  Affect Blunted  Speech Soft  Interaction Guarded;Childlike  Motor Activity Other (Comment)  Appearance/Hygiene Unremarkable (WNL)  Behavior Characteristics Impulsive;Hypersexual;Intrusive  Mood Preoccupied  Thought Process  Coherency Blocking;Disorganized  Content UTA  Delusions None reported or observed  Perception UTA  Hallucination None reported or observed  Judgment Impaired  Confusion Mild  Danger to Self  Current suicidal ideation? Denies  Agreement Not to Harm Self Yes  Description of Agreement Verbal  Danger to Others  Danger to Others None reported or observed

## 2023-01-02 NOTE — Progress Notes (Signed)
1:1 Note: Patient in her room sleeping. No distress noted. 1:1 monitoring remains. Patient remains safe at this time.

## 2023-01-02 NOTE — Progress Notes (Signed)
Nursing 1:1 note- Patient noted to be intrusive, going into other patient's room, staff able to redirect patient. Out in dayroom dancing in front of the television. 1:1 monitoring continues . Safety maintained.

## 2023-01-02 NOTE — Progress Notes (Signed)
   01/02/23 0558  15 Minute Checks  Location Bedroom  Visual Appearance Calm  Behavior Sleeping  Sleep (Behavioral Health Patients Only)  Calculate sleep? (Click Yes once per 24 hr at 0600 safety check) Yes  Documented sleep last 24 hours 9.25

## 2023-01-02 NOTE — Progress Notes (Signed)
[facility] MD Progress Note  01/02/2023 4:34 PM Archita Midou Hamadou  MRN:  [id number]  Reason For Admission: Waver Midou Hamadou is a 27 year old African-American female with a past psychiatric history significant for schizoaffective disorder (bipolar type) who was admitted to [facility] from [facility], ED after presenting to the ED on 7/25 by her husband and father because she stopped eating, stopped talking, and became more withdrawn the day prior.  Per patient's father, history of nonmedication compliance and patient typically after discharge, which always leads to a relapse in her symptoms.  Patient was already receiving transferred back to [facility] under [facility] for treatment and stabilization of her mental status.  24 hr chart review: Sleep Hours last night: Nursing Concerns: Remains on one-on-one staff monitoring, due to sexually inappropriate behaviors. Behavioral episodes in the past 24 hrs: Remains manic as per nursing staff reports Medication Compliance:Concerns remains for cheeking  Vital Signs in the past 24 hrs:WNL PRN Medications in the past 24 hrs: Agitation protocol medications today afternoon (Ativan 2 mg, Haldol 5 mg, Benadryl 50 mg p.o. at 11:22 AM today).  Patient assessment note:  Presentation today is labile; Patient is disorganized, sexually inappropriate, alternates between wearing her hijab, and removing it and throwing it on the floor, touching female patients in the day room, attempting to get into female patients rooms, requiring continuous verbal redirections and positive verbal reinforcements not to get into the rooms and not toothbrush female patients.  Continues to laugh inappropriately, continues to respond to questions in a nonlinear manner, inconsistent with what is being asked, and endorses auditory hallucinations of one voice, states he hears "Aisha".  Selectively mute at times, endorses visual hallucinations of her husband on the unit, states that the  writer can see him too, disorganized, with persistent psychosis.  Reports sleep as poor last night, even though staff reports that she was able to sleep, reports a good appetite, denies being in any physical pain.  We are increasing Haldol to 10 mg twice daily for management of psychosis, starting Cogentin 0.5 mg twice daily for EPS prophylaxis.  Continuing other medications as listed below, will continue to follow.  No TD/EPS type symptoms found on assessment, and pt denies any feelings of stiffness. AIMS: 0.   Principal Problem: Schizoaffective disorder, bipolar type (HCC) Diagnosis: Principal Problem:   Schizoaffective disorder, bipolar type (HCC) Active Problems:   Hyperlipidemia   Type II diabetes mellitus (HCC)   GAD (generalized anxiety disorder)  Total Time spent with patient: 35 minutes   Past Psychiatric History:  See H&P  Past Medical History:  Past Medical History:  Diagnosis Date   Bipolar 1 disorder (HCC)    Depression    Diabetes mellitus without complication (HCC)    Type 2 (Pre)   Schizophrenia (HCC)    History reviewed. No pertinent surgical history. Family History:  Family History  Problem Relation Age of Onset   Hypertension Father    Family Psychiatric  History:  See H&P  Social History:  Social History   Substance and Sexual Activity  Alcohol Use No   Alcohol/week: 0.0 standard drinks of alcohol     Social History   Substance and Sexual Activity  Drug Use No    Social History   Socioeconomic History   Marital status: Married    Spouse name: Amadou   Number of children: Not on file   Years of education: Not on file   Highest education level: Associate degree: occupational, Scientist, product/process development, or vocational  program  Occupational History   Occupation: unemployed  Tobacco Use   Smoking status: Never   Smokeless tobacco: Never  Vaping Use   Vaping status: Never Used  Substance and Sexual Activity   Alcohol use: No    Alcohol/week: 0.0 standard  drinks of alcohol   Drug use: No   Sexual activity: Yes    Birth control/protection: None  Other Topics Concern   Not on file  Social History Narrative   ** Merged History Encounter **       Social Determinants of Health   Financial Resource Strain: Not on file  Food Insecurity: Patient Declined (12/28/2022)   Hunger Vital Sign    Worried About Running Out of Food in the Last Year: Patient declined    Ran Out of Food in the Last Year: Patient declined  Transportation Needs: Patient Declined (12/28/2022)   PRAPARE - Administrator, Civil Service (Medical): Patient declined    Lack of Transportation (Non-Medical): Patient declined  Physical Activity: Inactive (04/03/2019)   Exercise Vital Sign    Days of Exercise per Week: 0 days    Minutes of Exercise per Session: 0 min  Stress: Not on file  Social Connections: Socially Integrated (04/03/2019)   Social Connection and Isolation Panel [NHANES]    Frequency of Communication with Friends and Family: More than three times a week    Frequency of Social Gatherings with Friends and Family: Once a week    Attends Religious Services: More than 4 times per year    Active Member of [employer] or Organizations: Yes    Attends Engineer, structural: Not on file    Marital Status: Married   Additional Social History:  See H&P  Sleep: Unable to assess  Appetite: Unable to assess  Current Medications: Current Facility-Administered Medications  Medication Dose Route Frequency Provider Last Rate Last Admin   acetaminophen (TYLENOL) tablet 650 mg  650 mg Oral Q6H PRN Massengill, Harrold Donath, MD   650 mg at 12/31/22 1456   alum & mag hydroxide-simeth (MAALOX/MYLANTA) 200-200-20 MG/5ML suspension 30 mL  30 mL Oral Q4H PRN Massengill, Harrold Donath, MD       benztropine (COGENTIN) tablet 0.5 mg  0.5 mg Oral BID Serrita Lueth, NP       cloNIDine (CATAPRES) tablet 0.1 mg  0.1 mg Oral Q6H PRN Abbott Pao, Nadir, MD       diphenhydrAMINE (BENADRYL)  capsule 50 mg  50 mg Oral TID PRN Phineas Inches, MD   50 mg at 01/02/23 1122   Or   diphenhydrAMINE (BENADRYL) injection 50 mg  50 mg Intramuscular TID PRN Massengill, Harrold Donath, MD       haloperidol (HALDOL) 2 MG/ML solution 10 mg  10 mg Oral BID Dmitriy Gair, NP       haloperidol (HALDOL) tablet 5 mg  5 mg Oral TID PRN Phineas Inches, MD   5 mg at 01/02/23 1122   Or   haloperidol lactate (HALDOL) injection 5 mg  5 mg Intramuscular TID PRN Massengill, Harrold Donath, MD       hydrOXYzine (ATARAX) tablet 50 mg  50 mg Oral TID PRN Phineas Inches, MD   50 mg at 01/01/23 2106   LORazepam (ATIVAN) tablet 2 mg  2 mg Oral TID PRN Phineas Inches, MD   2 mg at 01/02/23 1122   Or   LORazepam (ATIVAN) injection 2 mg  2 mg Intramuscular TID PRN Massengill, Harrold Donath, MD       magnesium hydroxide (MILK  OF MAGNESIA) suspension 30 mL  30 mL Oral Daily PRN Massengill, Harrold Donath, MD       propranolol (INDERAL) tablet 10 mg  10 mg Oral BID Starleen Blue, NP   10 mg at 01/02/23 0757   simvastatin (ZOCOR) tablet 10 mg  10 mg Oral q1800 Starleen Blue, NP   10 mg at 01/01/23 1829   traZODone (DESYREL) tablet 150 mg  150 mg Oral QHS Massengill, Harrold Donath, MD   150 mg at 01/01/23 2106   Vitamin D (Ergocalciferol) (DRISDOL) 1.25 MG (50000 UNIT) capsule 50,000 Units  50,000 Units Oral Q7 days Starleen Blue, NP   50,000 Units at 01/01/23 0945    Lab Results:  No results found for this or any previous visit (from the past 48 hour(s)).   Blood Alcohol level:  Lab Results  Component Value Date   ETH <10 12/27/2022   ETH <10 12/11/2022    Metabolic Disorder Labs: Lab Results  Component Value Date   HGBA1C 6.2 (H) 12/11/2022   MPG 131 12/11/2022   MPG 111.15 10/23/2018   Lab Results  Component Value Date   PROLACTIN 34.3 (H) 12/30/2022   PROLACTIN 13.0 12/11/2022   Lab Results  Component Value Date   CHOL 228 (H) 12/11/2022   TRIG 122 12/11/2022   HDL 91 12/11/2022   CHOLHDL 2.5 12/11/2022   VLDL  24 12/11/2022   LDLCALC 113 (H) 12/11/2022   LDLCALC 95 10/23/2018   Physical Findings: AIMS: 0 ,    CIWA:    COWS:     Musculoskeletal: Strength & Muscle Tone: within normal limits Gait & Station: normal Patient leans: N/A  Psychiatric Specialty Exam:  Presentation  General Appearance:  Fairly Groomed  Eye Contact: Fair  Speech: Clear and Coherent  Speech Volume: Normal  Handedness: Right   Mood and Affect  Mood: Labile  Affect: Congruent  Thought Process  Thought Processes: Disorganized  Descriptions of Associations:Circumstantial  Orientation:Partial  Thought Content:Illogical  History of Schizophrenia/Schizoaffective disorder:Yes  Duration of Psychotic Symptoms:Greater than six months  Hallucinations:Hallucinations: Auditory; Visual  Ideas of Reference:None  Suicidal Thoughts:Suicidal Thoughts: No  Homicidal Thoughts:Homicidal Thoughts: No  Sensorium  Memory: Immediate Good  Judgment: Poor  Insight: Poor   Executive Functions  Concentration: Fair  Attention Span: Poor  Recall: Poor  Fund of Knowledge: Poor  Language: Poor   Psychomotor Activity  Psychomotor Activity: Psychomotor Activity: Normal  Assets  Assets: Resilience   Sleep  Sleep: Sleep: Fair  Physical Exam: Physical Exam Constitutional:      Appearance: Normal appearance.  HENT:     Head: Normocephalic and atraumatic.     Nose: Nose normal.     Mouth/Throat:     Mouth: Mucous membranes are moist.  Eyes:     Extraocular Movements: Extraocular movements intact.  Cardiovascular:     Rate and Rhythm: Normal rate and regular rhythm.  Pulmonary:     Effort: Pulmonary effort is normal.  Abdominal:     General: Abdomen is flat.  Musculoskeletal:        General: Normal range of motion.     Cervical back: Normal range of motion.  Skin:    General: Skin is warm and dry.  Psychiatric:        Mood and Affect: Mood is depressed.         Speech: She is noncommunicative.        Behavior: Behavior is withdrawn.        Cognition and Memory: Cognition is  impaired.     Comments: Mostly unable to assess due to thought blocking and patient being mute    Review of Systems  Constitutional: Negative.   HENT: Negative.    Eyes: Negative.   Respiratory: Negative.    Cardiovascular: Negative.   Gastrointestinal: Negative.   Skin: Negative.   Neurological: Negative.   Psychiatric/Behavioral:  Positive for depression and hallucinations. Negative for memory loss (Unable to assess), substance abuse and suicidal ideas (Unable to assess). The patient is nervous/anxious (Unable to assess) and has insomnia (Unable to assess).    Blood pressure 114/72, pulse 99, temperature 98.3 F (36.8 C), temperature source Oral, resp. rate (!) 24, height 5\' 8"  (1.727 m), weight 101.2 kg, SpO2 100%. Body mass index is 33.91 kg/m.   Treatment Plan Summary: Daily contact with patient to assess and evaluate symptoms and progress in treatment and Medication management  Plan:  Patient exhibited thought blocking and was mostly mute during most of the assessment.  When patient was able to communicate, patient provided bizarre responses to the questions addressed to her.  Patient to remain on her current psychiatric medication regimen.  Patient to continue to be evaluated and assessed to determine efficacy of treatment.  Safety and Monitoring: Voluntary admission to inpatient psychiatric unit for safety, stabilization and treatment Daily contact with patient to assess and evaluate symptoms and progress in treatment Patient's case to be discussed in multi-disciplinary team meeting Observation Level : q15 minute checks Vital signs: q12 hours Precautions: Safety  Diagnosis: Principal Problem:   Schizoaffective disorder, bipolar type (HCC) Active Problems:   Hyperlipidemia   Type II diabetes mellitus (HCC)   GAD (generalized anxiety  disorder)  #Schizoaffective disorder (bipolar type) -Increase Haldol from 5 mg to 10 mg solution, for psychosis -Start Cogentin 0.5 mg BID for EPS prophylaxis  -Discontinued risperidone 2 mg  BID as psychosis is persistent  Agitation protocol: -Diphenhydramine 50 mg p.o. or injection, 3 times daily as needed for agitation -Haloperidol 5 mg p.o. or injection, 3 times daily as needed for agitation -Lorazepam 2 mg p.o. or injection, 3 times daily as needed for agitation  #Generalized anxiety disorder -Continue hydroxyzine 50 mg 3 times daily as needed for anxiety -Continue propranolol 10 mg 2 times daily for anxiety  #Hyperlipidemia -Continue simvastatin 10 mg daily  Insomnia -Continue trazodone 150 mg at bedtime for the management of insomnia  Hypertension -Continue clonidine 0.1 mg every 6 hours as needed for systolic blood pressure greater than 160 or diastolic blood pressure less greater than 100  Low Vitamin D level -Continue Vitamin D 50.000 units weekly  As needed medications: -Patient to continue taking Tylenol 650 mg every 6 hours as needed for mild pain -Patient to continue taking Maalox/Mylanta 30 mL every 4 hours as needed for indigestion -Patient to continue taking Milk of Magnesia 30 mL as needed for mild constipation  Labs reviewed: Ammonia level was found to be within normal limits (<10 umol/L).  Vitamin D level was found to be decreased (20.09 ng/mL), supple.menting.  Prolactin level slightly elevated at 34.   Discharge Planning: Social work and case management to assist with discharge planning and identification of hospital follow-up needs prior to discharge Estimated LOS: 5-7 days Discharge Concerns: Need to establish a safety plan; Medication compliance and effectiveness Discharge Goals: Return home with outpatient referrals for mental health follow-up including medication management/psychotherapy   I certify that inpatient services furnished can reasonably  be expected to improve the patient's condition.    Avamarie Crossley  Gwynneth Fabio, NP 01/02/2023, 4:34 PM Patient ID: Pairlee Midou Hamadou, female   DOB: January 20, 1996, 27 y.o.   MRN: [id number] Patient ID: Jhayla Midou Hamadou, female   DOB: 1996-03-09, 27 y.o.   MRN: [id number]

## 2023-01-03 LAB — URINALYSIS, ROUTINE W REFLEX MICROSCOPIC
Bilirubin Urine: NEGATIVE
Glucose, UA: NEGATIVE mg/dL
Hgb urine dipstick: NEGATIVE
Ketones, ur: NEGATIVE mg/dL
Leukocytes,Ua: NEGATIVE
Nitrite: NEGATIVE
Protein, ur: NEGATIVE mg/dL
Specific Gravity, Urine: 1.002 — ABNORMAL LOW (ref 1.005–1.030)
pH: 6 (ref 5.0–8.0)

## 2023-01-03 MED ORDER — WHITE PETROLATUM EX OINT
TOPICAL_OINTMENT | CUTANEOUS | Status: AC
  Filled 2023-01-03: qty 5

## 2023-01-03 NOTE — Progress Notes (Signed)
Adult Psychoeducational Group Note  Date:  01/03/2023 Time:  9:50 AM  Group Topic/Focus:  Goals Group:   The focus of this group is to help patients establish daily goals to achieve during treatment and discuss how the patient can incorporate goal setting into their daily lives to aide in recovery.  Participation Level:  Active  Participation Quality:  Appropriate  Affect:  Appropriate  Cognitive:  Appropriate  Insight: Appropriate  Engagement in Group:  Engaged  Modes of Intervention:  Discussion  Additional Comments:  The patient engaged in group.  [name] 01/03/2023, 9:50 AM

## 2023-01-03 NOTE — [facility] Counselor (Signed)
[facility] LCSW Progress Note   01/03/2023    11:12 AM  Barbara George      Type of Note: Collateral for Safety Planning    CSW went to see patient this AM to get  permission to speak with Dad or husband to complete safety planning . Patient was in dayroom , sitter and tech tried to get patient up and bring her out to speak with CSW, but patient would not , she had her eyes closed and when talking to her she would not respond back. Tech told CSW to return later because patient have been behavioral all morning towards staff.     Signed:   Jacob Moores, MSW, [facility] 01/03/2023 11:12 AM

## 2023-01-03 NOTE — Progress Notes (Signed)
1:1 Note: Patient maintained on constant supervision for safety.  Agitation protocol medications given for verbal aggression/disruption with good effect.  Routine safety checks maintained.  Support and encouragement offered as needed.  Attended group and participated.  Patient is safe on the unit with supervision.

## 2023-01-03 NOTE — Progress Notes (Signed)
[facility] MD Progress Note  01/03/2023 11:05 AM Barbara George  MRN:  [id number]  Reason For Admission: Barbara George is a 27 year old African-American female with a past psychiatric history significant for schizoaffective disorder (bipolar type) who was admitted to [facility] from [facility], ED after presenting to the ED on 7/25 by her husband and father because she stopped eating, stopped talking, and became more withdrawn the day prior.  Per patient's father, history of nonmedication compliance and patient typically after discharge, which always leads to a relapse in her symptoms.  Patient was already receiving transferred back to [facility] under IVC for treatment and stabilization of her mental status.  24 hr chart review: Sleep Hours last night: Nursing Concerns: Remains on one-on-one staff monitoring, due to sexually inappropriate behaviors. Behavioral episodes in the past 24 hrs: manic as per nursing staff : Agitation protocol medications today afternoon (Ativan 2 mg, Haldol 5 mg, Benadryl 50 mg ) Yesterday, and today AM  Patient assessment note:  Patient's case discussed in multidisciplinary meeting today.  Chart reviewed, patient seen during rounds.  Patient was inappropriate during the assessment.  Initially she was seen with the help of interpreter, later patient said that she speaks and understand [place], she does not need interpreter service.  Patient thoughts were disorganized and illogical.  Patient reports that yesterday a " Miracle" happened that is why she was agitated.  She also reports she lives with Jesus.  .  She also reports that she thinks [place] and Muslims are same people.  At some point during the assessment she accused the assessor trying to convert her.  Patient was in appropriate.  Towards end of visit has been she started cursing and was loud.  She denies thoughts of harming herself or others.  Could not assess for hallucinations because  patient went tangential and started talking nonsensical.  Principal Problem: Schizoaffective disorder, bipolar type (HCC) Diagnosis: Principal Problem:   Schizoaffective disorder, bipolar type (HCC) Active Problems:   Hyperlipidemia   Type II diabetes mellitus (HCC)   GAD (generalized anxiety disorder)  Total Time spent with patient: 35 minutes   Past Psychiatric History:  See H&P  Past Medical History:  Past Medical History:  Diagnosis Date   Bipolar 1 disorder (HCC)    Depression    Diabetes mellitus without complication (HCC)    Type 2 (Pre)   Schizophrenia (HCC)    History reviewed. No pertinent surgical history. Family History:  Family History  Problem Relation Age of Onset   Hypertension Father    Family Psychiatric  History:  See H&P  Social History:  Social History   Substance and Sexual Activity  Alcohol Use No   Alcohol/week: 0.0 standard drinks of alcohol     Social History   Substance and Sexual Activity  Drug Use No    Social History   Socioeconomic History   Marital status: Married    Spouse name: Amadou   Number of children: Not on file   Years of education: Not on file   Highest education level: Associate degree: occupational, Scientist, product/process development, or vocational program  Occupational History   Occupation: unemployed  Tobacco Use   Smoking status: Never   Smokeless tobacco: Never  Vaping Use   Vaping status: Never Used  Substance and Sexual Activity   Alcohol use: No    Alcohol/week: 0.0 standard drinks of alcohol   Drug use: No   Sexual activity: Yes    Birth control/protection: None  Other Topics Concern   Not on file  Social History Narrative   ** Merged History Encounter **       Social Determinants of Health   Financial Resource Strain: Not on file  Food Insecurity: Patient Declined (12/28/2022)   Hunger Vital Sign    Worried About Running Out of Food in the Last Year: Patient declined    Ran Out of Food in the Last Year: Patient  declined  Transportation Needs: Patient Declined (12/28/2022)   PRAPARE - Administrator, Civil Service (Medical): Patient declined    Lack of Transportation (Non-Medical): Patient declined  Physical Activity: Inactive (04/03/2019)   Exercise Vital Sign    Days of Exercise per Week: 0 days    Minutes of Exercise per Session: 0 min  Stress: Not on file  Social Connections: Socially Integrated (04/03/2019)   Social Connection and Isolation Panel [NHANES]    Frequency of Communication with Friends and Family: More than three times a week    Frequency of Social Gatherings with Friends and Family: Once a week    Attends Religious Services: More than 4 times per year    Active Member of [employer] or Organizations: Yes    Attends Engineer, structural: Not on file    Marital Status: Married     Sleep: Unable to assess  Appetite: Unable to assess  Current Medications: Current Facility-Administered Medications  Medication Dose Route Frequency Provider Last Rate Last Admin   acetaminophen (TYLENOL) tablet 650 mg  650 mg Oral Q6H PRN Massengill, Harrold Donath, MD   650 mg at 12/31/22 1456   alum & mag hydroxide-simeth (MAALOX/MYLANTA) 200-200-20 MG/5ML suspension 30 mL  30 mL Oral Q4H PRN Massengill, Harrold Donath, MD       benztropine (COGENTIN) tablet 0.5 mg  0.5 mg Oral BID Starleen Blue, NP   0.5 mg at 01/03/23 6606   cloNIDine (CATAPRES) tablet 0.1 mg  0.1 mg Oral Q6H PRN Abbott Pao, Nadir, MD       diphenhydrAMINE (BENADRYL) capsule 50 mg  50 mg Oral TID PRN Phineas Inches, MD   50 mg at 01/02/23 1122   Or   diphenhydrAMINE (BENADRYL) injection 50 mg  50 mg Intramuscular TID PRN Massengill, Harrold Donath, MD   50 mg at 01/03/23 1049   haloperidol (HALDOL) 2 MG/ML solution 10 mg  10 mg Oral BID Starleen Blue, NP   10 mg at 01/03/23 0831   haloperidol (HALDOL) tablet 5 mg  5 mg Oral TID PRN Phineas Inches, MD   5 mg at 01/02/23 1122   Or   haloperidol lactate (HALDOL) injection 5 mg  5  mg Intramuscular TID PRN Massengill, Harrold Donath, MD   5 mg at 01/03/23 1049   hydrOXYzine (ATARAX) tablet 50 mg  50 mg Oral TID PRN Phineas Inches, MD   50 mg at 01/02/23 2058   LORazepam (ATIVAN) tablet 2 mg  2 mg Oral TID PRN Phineas Inches, MD   2 mg at 01/02/23 1122   Or   LORazepam (ATIVAN) injection 2 mg  2 mg Intramuscular TID PRN Massengill, Harrold Donath, MD   2 mg at 01/03/23 1049   magnesium hydroxide (MILK OF MAGNESIA) suspension 30 mL  30 mL Oral Daily PRN Massengill, Harrold Donath, MD       propranolol (INDERAL) tablet 10 mg  10 mg Oral BID Starleen Blue, NP   10 mg at 01/03/23 0831   simvastatin (ZOCOR) tablet 10 mg  10 mg Oral q1800 Starleen Blue, NP  10 mg at 01/02/23 1651   traZODone (DESYREL) tablet 150 mg  150 mg Oral QHS Massengill, Harrold Donath, MD   150 mg at 01/02/23 2058   Vitamin D (Ergocalciferol) (DRISDOL) 1.25 MG (50000 UNIT) capsule 50,000 Units  50,000 Units Oral Q7 days Starleen Blue, NP   50,000 Units at 01/01/23 0945    Lab Results:  No results found for this or any previous visit (from the past 48 hour(s)).   Blood Alcohol level:  Lab Results  Component Value Date   ETH <10 12/27/2022   ETH <10 12/11/2022    Metabolic Disorder Labs: Lab Results  Component Value Date   HGBA1C 6.2 (H) 12/11/2022   MPG 131 12/11/2022   MPG 111.15 10/23/2018   Lab Results  Component Value Date   PROLACTIN 34.3 (H) 12/30/2022   PROLACTIN 13.0 12/11/2022   Lab Results  Component Value Date   CHOL 228 (H) 12/11/2022   TRIG 122 12/11/2022   HDL 91 12/11/2022   CHOLHDL 2.5 12/11/2022   VLDL 24 12/11/2022   LDLCALC 113 (H) 12/11/2022   LDLCALC 95 10/23/2018   Physical Findings: AIMS: 0 ,    CIWA:    COWS:     Musculoskeletal: Strength & Muscle Tone: within normal limits Gait & Station: normal Patient leans: N/A  Psychiatric Specialty Exam:  Presentation  General Appearance:  Fairly Groomed  Eye Contact: Fair  Speech: Clear and Coherent  Speech  Volume: Normal  Handedness: Right   Mood and Affect  Mood: Good  Affect: Labile, inappropriate  Thought Process  Thought Processes: Disorganized  Descriptions of Associations:Circumstantial  Orientation:Partial  Thought Content:Illogical  History of Schizophrenia/Schizoaffective disorder:Yes  Duration of Psychotic Symptoms:Greater than six months  Hallucinations:Couldn't assess  Ideas of Reference:None  Suicidal Thoughts:Suicidal Thoughts: No  Homicidal Thoughts:Homicidal Thoughts: No  Sensorium  Memory: Couldn't assess  Judgment: Poor  Insight: Poor   Executive Functions  Concentration: Fair  Attention Span: Poor  Recall: Poor  Fund of Knowledge: Poor  Language: Poor   Psychomotor Activity  Psychomotor Activity: Psychomotor Activity: Normal  Assets  Assets: Resilience   Sleep  Sleep: Sleep: Fair  Physical Exam: Constitutional:      Appearance: Normal appearance.  HENT:     Head: Normocephalic and atraumatic.  Eyes:     Extraocular Movements: Extraocular movements intact.  Cardiovascular:     Rate and Rhythm: Normal rate and regular rhythm.  Pulmonary:     Effort: Pulmonary effort is normal.   Musculoskeletal:        General: Normal range of motion.     Cervical back: Normal range of motion.  Skin:    General: Skin is warm and dry.   Review of Systems  Constitutional: Negative.   HENT: Negative.    Eyes: Negative.   Respiratory: Negative.    Cardiovascular: Negative.   Gastrointestinal: Negative.   Skin: Negative.   Neurological: Negative.  Blood pressure 96/72, pulse (!) 102, temperature (!) 97.4 F (36.3 C), temperature source Oral, resp. rate (!) 24, height 5\' 8"  (1.727 m), weight 101.2 kg, SpO2 100%. Body mass index is 33.91 kg/m.   Treatment Plan Summary: Daily contact with patient to assess and evaluate symptoms and progress in treatment and Medication management  Plan:  Patient exhibited thought  blocking and was mostly mute during most of the assessment.  When patient was able to communicate, patient provided bizarre responses to the questions addressed to her.  Patient to remain on her current psychiatric medication regimen.  Patient to continue to be evaluated and assessed to determine efficacy of treatment.  Safety and Monitoring: Voluntary admission to inpatient psychiatric unit for safety, stabilization and treatment Daily contact with patient to assess and evaluate symptoms and progress in treatment Patient's case to be discussed in multi-disciplinary team meeting Observation Level : q15 minute checks Vital signs: q12 hours Precautions: Safety  Diagnosis: Principal Problem:   Schizoaffective disorder, bipolar type (HCC) Active Problems:   Hyperlipidemia   Type II diabetes mellitus (HCC)   GAD (generalized anxiety disorder)  #Schizoaffective disorder (bipolar type) -Continue Haldol 5 mg BID, switch to liquid to ensure compliaance -Discontinue risperidone 2 mg  BID as psychosis is persistent  Agitation protocol: -Diphenhydramine 50 mg p.o. or injection, 3 times daily as needed for agitation -Haloperidol 5 mg p.o. or injection, 3 times daily as needed for agitation -Lorazepam 2 mg p.o. or injection, 3 times daily as needed for agitation  #Generalized anxiety disorder -Continue hydroxyzine 50 mg 3 times daily as needed for anxiety -Continue propranolol 10 mg 2 times daily for anxiety  #Hyperlipidemia -Continue simvastatin 10 mg daily  Insomnia -Continue trazodone 150 mg at bedtime for the management of insomnia  Hypertension -Continue clonidine 0.1 mg every 6 hours as needed for systolic blood pressure greater than 160 or diastolic blood pressure less greater than 100  Low Vitamin D level -Continue Vitamin D 50.000 units weekly  As needed medications: -Patient to continue taking Tylenol 650 mg every 6 hours as needed for mild pain -Patient to continue taking  Maalox/Mylanta 30 mL every 4 hours as needed for indigestion -Patient to continue taking Milk of Magnesia 30 mL as needed for mild constipation  Labs reviewed: Ammonia level was found to be within normal limits (<10 umol/L).  Vitamin D level was found to be decreased (20.09 ng/mL), supple.menting.  Prolactin level slightly elevated at 34. [name]  Discharge Planning: Social work and case management to assist with discharge planning and identification of hospital follow-up needs prior to discharge Estimated LOS: 5-7 days Discharge Concerns: Need to establish a safety plan; Medication compliance and effectiveness Discharge Goals: Return home with outpatient referrals for mental health follow-up including medication management/psychotherapy   I certify that inpatient services furnished can reasonably be expected to improve the patient's condition.    Lewanda Rife, MD

## 2023-01-03 NOTE — Progress Notes (Signed)
1:1 Note:  Patient maintained on constant supervision for safety.  Patient still disorganized, intrusive and sexually inappropriate in milieu.  Patient observed grabbing and leaning against female peers in the dayroom.  Becomes verbally abusive/aggressive towards staff when redirected.  Routine safety checks continues.  Support and encouragement offered as needed.  Patient is safe on the unit with supervision.

## 2023-01-03 NOTE — Progress Notes (Signed)
Nursing 1:1 note D:Pt in bathroom getting dressed at this hour. No distress noted. A: 1:1 observation continues for safety  R: Pt remains safe

## 2023-01-03 NOTE — Progress Notes (Signed)
   01/03/23 0612  15 Minute Checks  Location Bedroom  Visual Appearance Calm  Behavior Sleeping  Sleep (Behavioral Health Patients Only)  Calculate sleep? (Click Yes once per 24 hr at 0600 safety check) Yes  Documented sleep last 24 hours 11.5

## 2023-01-03 NOTE — Progress Notes (Signed)
Nursing 1:1 note D:Pt observed sleeping in bed with eyes closed. RR even and unlabored. No distress noted. A: 1:1 observation continues for safety  R: Pt remains safe  

## 2023-01-03 NOTE — Progress Notes (Signed)
1:1 Note: Patient maintained on constant supervision for safety.  Patient visible in milieu with intrusive behaviors, staring down at female peers, placed herself on the floor both in the dayroom and hallway.  Continues to need redirections on the unit.  Vomited x 1 this morning.  Routine safety checks maintained.  Patient safe on the unit with supervision.

## 2023-01-03 NOTE — Plan of Care (Signed)
  Problem: Education: Goal: Emotional status will improve Outcome: Progressing Goal: Mental status will improve Outcome: Progressing   Problem: Activity: Goal: Interest or engagement in activities will improve Outcome: Progressing Goal: Sleeping patterns will improve Outcome: Progressing   Problem: Coping: Goal: Ability to verbalize frustrations and anger appropriately will improve Outcome: Progressing   Problem: Safety: Goal: Periods of time without injury will increase Outcome: Progressing

## 2023-01-03 NOTE — Group Note (Signed)
Recreation Therapy Group Note   Group Topic:Problem Solving  Group Date: 01/03/2023 Start Time: 1015 End Time: 1050 Facilitators: Tyshaun Vinzant-McCall, LRT,CTRS Location: [address] Dayroom   Goal Area(s) Addresses:  Patient will effectively work in a team with other group members. Patient will verbalize importance of using appropriate problem solving techniques.  Patient will identify positive change associated with effective problem solving skills.   Group Description: Brain Teasers. Patients form groups of no more than 3 people. Patients given two sheets of brain teasers each. Patients work together to figure out the answers to each puzzle. Patients given 20 minutes to work on the brain teasers. LRT will go over the final answers with patients.    Affect/Mood: Inappropriate   Participation Level: None   Participation Quality: Maximum Cues   Behavior: Disruptive and Impulsive   Speech/Thought Process: Delusional   Insight: Lacking   Judgement: Lacking    Modes of Intervention: Worksheet   Patient Response to Interventions:  Disengaged   Education Outcome:  In group clarification offered    Clinical Observations/Individualized Feedback: Pt didn't participate in activity. Pt was disruptive and intruding in peers personal space. Pt needed constant redirection. Pt was making peers uncomfortable. Pt was put out of group by LRT due to behavior.     Plan: Continue to engage patient in RT group sessions 2-3x/week.   Tiarrah Saville-McCall, LRT,CTRS  01/03/2023 12:08 PM

## 2023-01-04 ENCOUNTER — Encounter (HOSPITAL_COMMUNITY): Payer: Self-pay

## 2023-01-04 DIAGNOSIS — F250 Schizoaffective disorder, bipolar type: Secondary | ICD-10-CM | POA: Diagnosis not present

## 2023-01-04 LAB — PREGNANCY, URINE: Preg Test, Ur: NEGATIVE

## 2023-01-04 MED ORDER — BENZTROPINE MESYLATE 1 MG PO TABS
1.0000 mg | ORAL_TABLET | Freq: Two times a day (BID) | ORAL | Status: DC
  Administered 2023-01-04 – 2023-01-06 (×4): 1 mg via ORAL
  Filled 2023-01-04 (×7): qty 1

## 2023-01-04 MED ORDER — HALOPERIDOL LACTATE 2 MG/ML PO CONC
10.0000 mg | Freq: Three times a day (TID) | ORAL | Status: DC
  Administered 2023-01-04 – 2023-01-06 (×5): 10 mg via ORAL
  Filled 2023-01-04 (×8): qty 5

## 2023-01-04 NOTE — Plan of Care (Signed)
  Problem: Education: Goal: Knowledge of [place] General Education information/materials will improve Outcome: Progressing Goal: Emotional status will improve Outcome: Progressing Goal: Mental status will improve Outcome: Progressing Goal: Verbalization of understanding the information provided will improve Outcome: Progressing   Problem: Activity: Goal: Interest or engagement in activities will improve Outcome: Progressing Goal: Sleeping patterns will improve Outcome: Progressing   Problem: Coping: Goal: Ability to verbalize frustrations and anger appropriately will improve Outcome: Progressing Goal: Ability to demonstrate self-control will improve Outcome: Progressing   Problem: Health Behavior/Discharge Planning: Goal: Identification of resources available to assist in meeting health care needs will improve Outcome: Progressing Goal: Compliance with treatment plan for underlying cause of condition will improve Outcome: Progressing   Problem: Physical Regulation: Goal: Ability to maintain clinical measurements within normal limits will improve Outcome: Progressing   Problem: Safety: Goal: Periods of time without injury will increase Outcome: Progressing   Problem: Education: Goal: Will be free of psychotic symptoms Outcome: Progressing Goal: Knowledge of the prescribed therapeutic regimen will improve Outcome: Progressing   Problem: Coping: Goal: Coping ability will improve Outcome: Progressing Goal: Will verbalize feelings Outcome: Progressing   Problem: Health Behavior/Discharge Planning: Goal: Compliance with prescribed medication regimen will improve Outcome: Progressing   Problem: Nutritional: Goal: Ability to achieve adequate nutritional intake will improve Outcome: Progressing   Problem: Coping: Goal: Ability to identify and develop effective coping behavior will improve Outcome: Progressing Goal: Ability to interact with others will  improve Outcome: Progressing Goal: Demonstration of participation in decision-making regarding own care will improve Outcome: Progressing Goal: Ability to use eye contact when communicating with others will improve Outcome: Progressing   Problem: Health Behavior/Discharge Planning: Goal: Identification of resources available to assist in meeting health care needs will improve Outcome: Progressing   Problem: Self-Concept: Goal: Will verbalize positive feelings about self Outcome: Progressing

## 2023-01-04 NOTE — Group Note (Signed)
Date:  01/04/2023 Time:  9:31 AM  Group Topic/Focus:  Goals Group:   The focus of this group is to help patients establish daily goals to achieve during treatment and discuss how the patient can incorporate goal setting into their daily lives to aide in recovery.    Participation Level:  Active  Participation Quality:  Attentive  Affect:  Appropriate  Cognitive:  Disorganized  Insight: Lacking  Engagement in Group:  Lacking  Modes of Intervention:  Exploration  Additional Comments:  Pt. Goal: To find peace and freedom.  Barbara George 01/04/2023, 9:31 AM

## 2023-01-04 NOTE — Progress Notes (Signed)
1:1 Note: Patient maintained on constant supervision for safety.  Medications given as prescribed.  Patient disorganized, delusional and intrusive.  She is disruptive in milieu, pouring coffee inside water jug and grabbing other patient's stuff.  Patient is safe on the unit with supervision.

## 2023-01-04 NOTE — [facility] Group Notes (Signed)
Spiritual care group facilitated by Chaplain Dyanne Carrel, [facility]  Group focused on topic of strength. Group members reflected on what thoughts and feelings emerge when they hear this topic. They then engaged in facilitated dialog around how strength is present in their lives. This dialog focused on representing what strength had been to them in their lives (images and patterns given) and what they saw as helpful in their life now (what they needed / wanted).  Activity drew on narrative framework.  Patient Progress: Barbara George attended group and actively engaged and participated in group conversation.  At times, it was difficult to understand her meaning and she did not want to sit during group.  She would laugh at times that did not seem to match what was happening in the conversation. She attempted multiple times to engage chaplain in dialogue when another staff member gave her a direction.

## 2023-01-04 NOTE — Group Note (Signed)
Date:  01/04/2023 Time:  9:20 PM  Group Topic/Focus:  Wrap-Up Group:   The focus of this group is to help patients review their daily goal of treatment and discuss progress on daily workbooks.    Participation Level:  None  Participation Quality:   Patient did not participate in group  Affect:   Patient did not participate in group  Cognitive:   Patient did not participate in group  Insight: None  Engagement in Group:  None  Modes of Intervention:   Patient did not participate in group  Additional Comments:   Patient did not participate in group  [facility] [place] 01/04/2023, 9:20 PM

## 2023-01-04 NOTE — Group Note (Signed)
Recreation Therapy Group Note   Group Topic:Problem Solving  Group Date: 01/04/2023 Start Time: 1022 End Time: 1115 Facilitators: Adelaide Pfefferkorn-McCall, LRT,CTRS Location: [address] Dayroom   Goal Area(s) Addresses:  Patient will effectively work with peer towards shared goal.  Patient will identify skills used to make activity successful.  Patient will identify how skills used during activity can be used to reach post d/c goals.    Group Description: Landing Pad. In teams of 3-5, patients were given 12 plastic drinking straws and an equal length of masking tape. Using the materials provided, patients were asked to build a landing pad to catch a golf ball dropped from approximately 5 feet in the air. All materials were required to be used by the team in their design. LRT facilitated post-activity discussion.   Affect/Mood: Inappropriate   Participation Level: None   Participation Quality: Maximum Cues   Behavior: Disruptive and Poor boundaries    Speech/Thought Process: Distracted   Insight: Lacking   Judgement: Lacking    Modes of Intervention: STEM Activity   Patient Response to Interventions:  Resistant    Education Outcome:  In group clarification offered    Clinical Observations/Individualized Feedback: Pt needed constant redirection. Pt was violating other patients personal space and had to be redirected. Pt was unfocused and attention seeking. Pt was inappropriate during group session.    Plan: Continue to engage patient in RT group sessions 2-3x/week.   Jarian Longoria-McCall, LRT,CTRS 01/04/2023 12:08 PM

## 2023-01-04 NOTE — Progress Notes (Signed)
1:1 Note: Patient maintained on constant supervision for safety.  Patient is disorganized, intrusive and sexually inappropriate.  Continues to need a lot of redirection on the unit.  Routine safety checks maintained.  Patient is safe on the unit with supervision.

## 2023-01-04 NOTE — BH IP Treatment Plan (Signed)
Interdisciplinary Treatment and Diagnostic Plan Update  01/04/2023 Time of Session: 9:50 AM ( UPDATE)  Lajada Midou Hamadou MRN: [id number]  Principal Diagnosis: Schizoaffective disorder, bipolar type (HCC)  Secondary Diagnoses: Principal Problem:   Schizoaffective disorder, bipolar type (HCC) Active Problems:   Hyperlipidemia   Type II diabetes mellitus (HCC)   GAD (generalized anxiety disorder)   Current Medications:  Current Facility-Administered Medications  Medication Dose Route Frequency Provider Last Rate Last Admin   acetaminophen (TYLENOL) tablet 650 mg  650 mg Oral Q6H PRN Massengill, Harrold Donath, MD   650 mg at 12/31/22 1456   alum & mag hydroxide-simeth (MAALOX/MYLANTA) 200-200-20 MG/5ML suspension 30 mL  30 mL Oral Q4H PRN Massengill, Harrold Donath, MD       benztropine (COGENTIN) tablet 0.5 mg  0.5 mg Oral BID Starleen Blue, NP   0.5 mg at 01/04/23 4010   cloNIDine (CATAPRES) tablet 0.1 mg  0.1 mg Oral Q6H PRN Abbott Pao, Nadir, MD       diphenhydrAMINE (BENADRYL) capsule 50 mg  50 mg Oral TID PRN Phineas Inches, MD   50 mg at 01/02/23 1122   Or   diphenhydrAMINE (BENADRYL) injection 50 mg  50 mg Intramuscular TID PRN Massengill, Harrold Donath, MD   50 mg at 01/03/23 1049   haloperidol (HALDOL) 2 MG/ML solution 10 mg  10 mg Oral BID Starleen Blue, NP   10 mg at 01/04/23 2725   haloperidol (HALDOL) tablet 5 mg  5 mg Oral TID PRN Phineas Inches, MD   5 mg at 01/02/23 1122   Or   haloperidol lactate (HALDOL) injection 5 mg  5 mg Intramuscular TID PRN Massengill, Harrold Donath, MD   5 mg at 01/03/23 1049   hydrOXYzine (ATARAX) tablet 50 mg  50 mg Oral TID PRN Phineas Inches, MD   50 mg at 01/03/23 1657   LORazepam (ATIVAN) tablet 2 mg  2 mg Oral TID PRN Phineas Inches, MD   2 mg at 01/02/23 1122   Or   LORazepam (ATIVAN) injection 2 mg  2 mg Intramuscular TID PRN Massengill, Harrold Donath, MD   2 mg at 01/03/23 1049   magnesium hydroxide (MILK OF MAGNESIA) suspension 30 mL  30 mL Oral  Daily PRN Massengill, Harrold Donath, MD       propranolol (INDERAL) tablet 10 mg  10 mg Oral BID Starleen Blue, NP   10 mg at 01/04/23 0802   simvastatin (ZOCOR) tablet 10 mg  10 mg Oral q1800 Starleen Blue, NP   10 mg at 01/03/23 1709   traZODone (DESYREL) tablet 150 mg  150 mg Oral QHS Massengill, Harrold Donath, MD   150 mg at 01/03/23 2133   Vitamin D (Ergocalciferol) (DRISDOL) 1.25 MG (50000 UNIT) capsule 50,000 Units  50,000 Units Oral Q7 days Starleen Blue, NP   50,000 Units at 01/01/23 0945   PTA Medications: Medications Prior to Admission  Medication Sig Dispense Refill Last Dose   ARIPiprazole (ABILIFY) 10 MG tablet Take 1 tablet (10 mg total) by mouth as directed for 15 doses. Taper off oral abilify as follows: Take 2 tablets once daily for 5 days. Then decrease to taking 1 tablet once daily for 5 days. Then stop. 15 tablet 0    [START ON 01/15/2023] ARIPiprazole ER (ABILIFY MAINTENA) 400 MG SRER injection Inject 2 mLs (400 mg total) into the muscle every 28 (twenty-eight) days for 1 dose. Next dose is due on 01-15-23. 1 each 0    hydrOXYzine (ATARAX) 50 MG tablet Take 1 tablet (50 mg total) by  mouth 3 (three) times daily as needed for anxiety. 30 tablet 0    risperiDONE (RISPERDAL M-TABS) 1 MG disintegrating tablet Take 1 tablet (1 mg total) by mouth at bedtime. 30 tablet 0    simvastatin (ZOCOR) 10 MG tablet Take 1 tablet (10 mg total) by mouth daily at 6 PM for 14 days. 14 tablet 0    traZODone (DESYREL) 150 MG tablet Take 1 tablet (150 mg total) by mouth at bedtime. 30 tablet 0     Patient Stressors:    Patient Strengths:    Treatment Modalities: Medication Management, Group therapy, Case management,  1 to 1 session with clinician, Psychoeducation, Recreational therapy.   Physician Treatment Plan for Primary Diagnosis: Schizoaffective disorder, bipolar type (HCC) Long Term Goal(s): Improvement in symptoms so as ready for discharge   Short Term Goals: Ability to identify changes in  lifestyle to reduce recurrence of condition will improve Ability to verbalize feelings will improve Ability to disclose and discuss suicidal ideas Ability to demonstrate self-control will improve Ability to identify and develop effective coping behaviors will improve Ability to maintain clinical measurements within normal limits will improve Compliance with prescribed medications will improve  Medication Management: Evaluate patient's response, side effects, and tolerance of medication regimen.  Therapeutic Interventions: 1 to 1 sessions, Unit Group sessions and Medication administration.  Evaluation of Outcomes: Not Progressing  Physician Treatment Plan for Secondary Diagnosis: Principal Problem:   Schizoaffective disorder, bipolar type (HCC) Active Problems:   Hyperlipidemia   Type II diabetes mellitus (HCC)   GAD (generalized anxiety disorder)  Long Term Goal(s): Improvement in symptoms so as ready for discharge   Short Term Goals: Ability to identify changes in lifestyle to reduce recurrence of condition will improve Ability to verbalize feelings will improve Ability to disclose and discuss suicidal ideas Ability to demonstrate self-control will improve Ability to identify and develop effective coping behaviors will improve Ability to maintain clinical measurements within normal limits will improve Compliance with prescribed medications will improve     Medication Management: Evaluate patient's response, side effects, and tolerance of medication regimen.  Therapeutic Interventions: 1 to 1 sessions, Unit Group sessions and Medication administration.  Evaluation of Outcomes: Not Progressing   RN Treatment Plan for Primary Diagnosis: Schizoaffective disorder, bipolar type (HCC) Long Term Goal(s): Knowledge of disease and therapeutic regimen to maintain health will improve  Short Term Goals: Ability to remain free from injury will improve, Ability to verbalize frustration and  anger appropriately will improve, Ability to participate in decision making will improve, Ability to verbalize feelings will improve, Ability to identify and develop effective coping behaviors will improve, and Compliance with prescribed medications will improve  Medication Management: RN will administer medications as ordered by provider, will assess and evaluate patient's response and provide education to patient for prescribed medication. RN will report any adverse and/or side effects to prescribing provider.  Therapeutic Interventions: 1 on 1 counseling sessions, Psychoeducation, Medication administration, Evaluate responses to treatment, Monitor vital signs and CBGs as ordered, Perform/monitor CIWA, COWS, AIMS and Fall Risk screenings as ordered, Perform wound care treatments as ordered.  Evaluation of Outcomes: Not Progressing   LCSW Treatment Plan for Primary Diagnosis: Schizoaffective disorder, bipolar type (HCC) Long Term Goal(s): Safe transition to appropriate next level of care at discharge, Engage patient in therapeutic group addressing interpersonal concerns.  Short Term Goals: Engage patient in aftercare planning with referrals and resources, Increase social support, Increase emotional regulation, Facilitate acceptance of mental health diagnosis and concerns, Identify  triggers associated with mental health/substance abuse issues, and Increase skills for wellness and recovery  Therapeutic Interventions: Assess for all discharge needs, 1 to 1 time with Social worker, Explore available resources and support systems, Assess for adequacy in community support network, Educate family and significant other(s) on suicide prevention, Complete Psychosocial Assessment, Interpersonal group therapy.  Evaluation of Outcomes: Not Progressing   Progress in Treatment: Attending groups: Yes. Participating in groups: Yes. Taking medication as prescribed: Yes. Toleration medication:  Yes. Family/Significant other contact made: No, will contact:  once PT gives permission to contact family  Patient understands diagnosis: No. Discussing patient identified problems/goals with staff: Yes. Medical problems stabilized or resolved: Yes. Denies suicidal/homicidal ideation: Yes. Issues/concerns per patient self-inventory: No.    New problem(s) identified: No, Describe:  none reported   New Short Term/Long Term Goal(s):  medication stabilization, elimination of SI thoughts, development of comprehensive mental wellness plan.     Patient Goals:  "Help with depression"   Discharge Plan or Barriers: Patient recently admitted. CSW will continue to follow and assess for appropriate referrals and possible discharge planning.       Reason for Continuation of Hospitalization: Anxiety Delusions  Depression Mania Medication stabilization   Estimated Length of Stay: 6-8 days     Last 3 [place] Suicide Severity Risk Score: Flowsheet Row [facility] Most recent reading at 12/28/2022  5:00 PM Ad[facility] (Discharged) [facility] Most recent reading at 12/11/2022  9:27 PM ED from 12/11/2022 in [facility] Most recent reading at 12/11/2022 11:51 AM  C-SSRS RISK CATEGORY No Risk No Risk No Risk       Last PHQ 2/9 Scores:    12/10/2022    8:03 AM 09/17/2022    1:18 PM 07/24/2021    3:15 PM  Depression screen PHQ 2/9  Decreased Interest 0 1 0  Down, Depressed, Hopeless 3 0 0  PHQ - 2 Score 3 1 0  Altered sleeping 3 0   Tired, decreased energy 1 1   Change in appetite 1 0   Feeling bad or failure about yourself  1 0   Trouble concentrating 2 0   Moving slowly or fidgety/restless 2 0   Suicidal thoughts 0 0   PHQ-9 Score 13 2   Difficult doing work/chores Very difficult      Scribe for Treatment Team: Isabella Bowens,  LCSWA 01/04/2023 3:30 PM

## 2023-01-04 NOTE — Progress Notes (Signed)
Patient attempting to push pass her sitter not properly dressed wanting to come into hallway.  Patient requested snacks which she received along with prn vistaril to help her to get more rest. 1:1 continues with sitter at bedside.

## 2023-01-04 NOTE — Progress Notes (Signed)
1:1 Note: Patient maintained on constant supervision for safety and sexually inappropriate behaviors.  Patient observed attempting to rub herself on female peers in the dayroom.  Continues to require multiple redirections.  Safety maintained with supervision.

## 2023-01-04 NOTE — Progress Notes (Signed)
1:1 Nursing note- Patient started the evening after her husband visit she was redirected after staff noticed she was up close on another female patient in the dayroom. She became mad when she was redirected and went to her room. She then undressed with her door open and sitter had to close her door so that other patients would not see her. She was observed in the shower with her pants on. Writer spoke with her about her 1:1 about her behavior and being married and how she needs to focus on getting herself better and stop flirting with the female patients on the unit. She smiled at Clinical research associate  and did not say anything. 1:1 continues with sitter close by patient.

## 2023-01-04 NOTE — Plan of Care (Signed)
  Problem: Education: Goal: Knowledge of [place] [facility] Education information/materials will improve Outcome: Not Progressing Goal: Emotional status will improve Outcome: Not Progressing Goal: Mental status will improve Outcome: Not Progressing Goal: Verbalization of understanding the information provided will improve Outcome: Not Progressing   Problem: Activity: Goal: Interest or engagement in activities will improve Outcome: Not Progressing Goal: Sleeping patterns will improve Outcome: Not Progressing   Problem: Coping: Goal: Ability to verbalize frustrations and anger appropriately will improve Outcome: Not Progressing Goal: Ability to demonstrate self-control will improve Outcome: Not Progressing   Problem: Health Behavior/Discharge Planning: Goal: Identification of resources available to assist in meeting health care needs will improve Outcome: Not Progressing Goal: Compliance with treatment plan for underlying cause of condition will improve Outcome: Not Progressing   Problem: Physical Regulation: Goal: Ability to maintain clinical measurements within normal limits will improve Outcome: Not Progressing   Problem: Safety: Goal: Periods of time without injury will increase Outcome: Not Progressing   Problem: Education: Goal: Will be free of psychotic symptoms Outcome: Not Progressing Goal: Knowledge of the prescribed therapeutic regimen will improve Outcome: Not Progressing   Problem: Coping: Goal: Coping ability will improve Outcome: Not Progressing Goal: Will verbalize feelings Outcome: Not Progressing   Problem: Health Behavior/Discharge Planning: Goal: Compliance with prescribed medication regimen will improve Outcome: Not Progressing   Problem: Nutritional: Goal: Ability to achieve adequate nutritional intake will improve Outcome: Not Progressing   Problem: Coping: Goal: Ability to identify and develop effective coping behavior will  improve Outcome: Not Progressing Goal: Ability to interact with others will improve Outcome: Not Progressing Goal: Demonstration of participation in decision-making regarding own care will improve Outcome: Not Progressing Goal: Ability to use eye contact when communicating with others will improve Outcome: Not Progressing   Problem: Health Behavior/Discharge Planning: Goal: Identification of resources available to assist in meeting health care needs will improve Outcome: Not Progressing   Problem: Self-Concept: Goal: Will verbalize positive feelings about self Outcome: Not Progressing

## 2023-01-04 NOTE — Progress Notes (Signed)
[facility] MD Progress Note  01/05/2023 8:42 AM Barbara George  MRN:  [id number]  Reason For Admission: Barbara George is a 27 year old African-American female with a past psychiatric history significant for schizoaffective disorder (bipolar type) who was admitted to [facility] from [facility], ED after presenting to the ED on 7/25 by her husband and father because she stopped eating, stopped talking, and became more withdrawn the day prior.  Per patient's father, history of nonmedication compliance and patient typically after discharge, which always leads to a relapse in her symptoms.  Patient was already receiving transferred back to [facility] under IVC for treatment and stabilization of her mental status.  24 hr chart review: Sleep Hours last night: Nursing Concerns: Remains on one-on-one staff monitoring, due to sexually inappropriate behaviors. Behavioral episodes in the past 24 hrs: manic as per nursing staff : Agitation protocol medications today afternoon (Ativan 2 mg, Haldol 5 mg, Benadryl 50 mg ) Yesterday morning at 1049 am.   Patient assessment note:  Patient continues to present disorganized, illogical, is tangential, is nonlinear in her responses to questions being asked, but denies SI/HI/AVH.  Continues to be manic, sexually inappropriate on the unit as per nursing staff, engages in flirtatious behaviors with the female patients on the unit, continues to require multiple verbal redirections not to get into other patients basis.  We are continuing one-on-one staff monitoring due to safety concerns for patient and other patients on the unit.  Assessment was ongoing in patient's room, when she walked out and stated to writer that she was done talking, and walked to the day room.  As per staff, she is taking medications being given to her (Haldol 10 mg twice daily).  Haldol was switched to solution a few days ago to ensure compliance as there were concerns for  patient cheeking the medications.   Today, attending psychiatrist and writer talked about placing patient on a mood stabilizer, but there are concerns because she is of childbearing age.  We will order a serum hCG for tomorrow morning as the last one was 8 days ago, was negative, but pt was discharged in between the hCG being completed.  We will consider Depakote for mood stabilization once the results of the HCG  return.  Patient is requiring Haldol agitation protocol daily in the past 2 days, and also getting her scheduled doses.  We are increasing Haldol for now to 10 mg 3 times daily, and increasing Cogentin to 1 mg twice daily for EPS prophylaxis.   Principal Problem: Schizoaffective disorder, bipolar type (HCC) Diagnosis: Principal Problem:   Schizoaffective disorder, bipolar type (HCC) Active Problems:   Hyperlipidemia   Type II diabetes mellitus (HCC)   GAD (generalized anxiety disorder)  Total Time spent with patient: 35 minutes   Past Psychiatric History:  See H&P  Past Medical History:  Past Medical History:  Diagnosis Date   Bipolar 1 disorder (HCC)    Depression    Diabetes mellitus without complication (HCC)    Type 2 (Pre)   Schizophrenia (HCC)    History reviewed. No pertinent surgical history. Family History:  Family History  Problem Relation Age of Onset   Hypertension Father    Family Psychiatric  History:  See H&P  Social History:  Social History   Substance and Sexual Activity  Alcohol Use No   Alcohol/week: 0.0 standard drinks of alcohol     Social History   Substance and Sexual Activity  Drug Use No  Social History   Socioeconomic History   Marital status: Married    Spouse name: Amadou   Number of children: Not on file   Years of education: Not on file   Highest education level: Associate degree: occupational, Scientist, product/process development, or vocational program  Occupational History   Occupation: unemployed  Tobacco Use   Smoking status: Never    Smokeless tobacco: Never  Vaping Use   Vaping status: Never Used  Substance and Sexual Activity   Alcohol use: No    Alcohol/week: 0.0 standard drinks of alcohol   Drug use: No   Sexual activity: Yes    Birth control/protection: None  Other Topics Concern   Not on file  Social History Narrative   ** Merged History Encounter **       Social Determinants of Health   Financial Resource Strain: Not on file  Food Insecurity: Patient Declined (12/28/2022)   Hunger Vital Sign    Worried About Running Out of Food in the Last Year: Patient declined    Ran Out of Food in the Last Year: Patient declined  Transportation Needs: Patient Declined (12/28/2022)   PRAPARE - Administrator, Civil Service (Medical): Patient declined    Lack of Transportation (Non-Medical): Patient declined  Physical Activity: Inactive (04/03/2019)   Exercise Vital Sign    Days of Exercise per Week: 0 days    Minutes of Exercise per Session: 0 min  Stress: Not on file  Social Connections: Socially Integrated (04/03/2019)   Social Connection and Isolation Panel [NHANES]    Frequency of Communication with Friends and Family: More than three times a week    Frequency of Social Gatherings with Friends and Family: Once a week    Attends Religious Services: More than 4 times per year    Active Member of [employer] or Organizations: Yes    Attends Engineer, structural: Not on file    Marital Status: Married   Sleep: Unable to assess  Appetite: Unable to assess  Current Medications: Current Facility-Administered Medications  Medication Dose Route Frequency Provider Last Rate Last Admin   acetaminophen (TYLENOL) tablet 650 mg  650 mg Oral Q6H PRN Massengill, Harrold Donath, MD   650 mg at 12/31/22 1456   alum & mag hydroxide-simeth (MAALOX/MYLANTA) 200-200-20 MG/5ML suspension 30 mL  30 mL Oral Q4H PRN Massengill, Harrold Donath, MD       benztropine (COGENTIN) tablet 1 mg  1 mg Oral BID Cheikh Bramble, NP   1 mg at  01/05/23 0758   cloNIDine (CATAPRES) tablet 0.1 mg  0.1 mg Oral Q6H PRN Abbott Pao, Nadir, MD       diphenhydrAMINE (BENADRYL) capsule 50 mg  50 mg Oral TID PRN Phineas Inches, MD   50 mg at 01/02/23 1122   Or   diphenhydrAMINE (BENADRYL) injection 50 mg  50 mg Intramuscular TID PRN Massengill, Harrold Donath, MD   50 mg at 01/03/23 1049   haloperidol (HALDOL) 2 MG/ML solution 10 mg  10 mg Oral TID Starleen Blue, NP   10 mg at 01/05/23 0758   haloperidol (HALDOL) tablet 5 mg  5 mg Oral TID PRN Phineas Inches, MD   5 mg at 01/02/23 1122   Or   haloperidol lactate (HALDOL) injection 5 mg  5 mg Intramuscular TID PRN Phineas Inches, MD   5 mg at 01/03/23 1049   hydrOXYzine (ATARAX) tablet 50 mg  50 mg Oral TID PRN Phineas Inches, MD   50 mg at 01/04/23 2320  LORazepam (ATIVAN) tablet 2 mg  2 mg Oral TID PRN Phineas Inches, MD   2 mg at 01/02/23 1122   Or   LORazepam (ATIVAN) injection 2 mg  2 mg Intramuscular TID PRN Phineas Inches, MD   2 mg at 01/03/23 1049   magnesium hydroxide (MILK OF MAGNESIA) suspension 30 mL  30 mL Oral Daily PRN Massengill, Harrold Donath, MD       propranolol (INDERAL) tablet 10 mg  10 mg Oral BID Starleen Blue, NP   10 mg at 01/05/23 0759   simvastatin (ZOCOR) tablet 10 mg  10 mg Oral q1800 Starleen Blue, NP   10 mg at 01/04/23 1740   traZODone (DESYREL) tablet 150 mg  150 mg Oral QHS Massengill, Harrold Donath, MD   150 mg at 01/04/23 2025   Vitamin D (Ergocalciferol) (DRISDOL) 1.25 MG (50000 UNIT) capsule 50,000 Units  50,000 Units Oral Q7 days Starleen Blue, NP   50,000 Units at 01/01/23 0945    Lab Results:  Results for orders placed or performed during the hospital encounter of 12/28/22 (from the past 48 hour(s))  Urinalysis, Routine w reflex microscopic -Urine, Clean Catch     Status: Abnormal   Collection Time: 01/03/23 11:46 AM  Result Value Ref Range   Color, Urine COLORLESS (A) YELLOW   APPearance CLEAR CLEAR   Specific Gravity, Urine 1.002 (L) 1.005 -  1.030   pH 6.0 5.0 - 8.0   Glucose, UA NEGATIVE NEGATIVE mg/dL   Hgb urine dipstick NEGATIVE NEGATIVE   Bilirubin Urine NEGATIVE NEGATIVE   Ketones, ur NEGATIVE NEGATIVE mg/dL   Protein, ur NEGATIVE NEGATIVE mg/dL   Nitrite NEGATIVE NEGATIVE   Leukocytes,Ua NEGATIVE NEGATIVE    Comment: Performed at [facility], [address] [place], [place] [address]  Pregnancy, urine     Status: None   Collection Time: 01/03/23  1:21 PM  Result Value Ref Range   Preg Test, Ur NEGATIVE NEGATIVE    Comment:        THE SENSITIVITY OF THIS METHODOLOGY IS >25 mIU/mL. Performed at [facility], [address] [place], [place] [address]      Blood Alcohol level:  Lab Results  Component Value Date   [facility] <10 12/27/2022   ETH <10 12/11/2022    Metabolic Disorder Labs: Lab Results  Component Value Date   HGBA1C 6.2 (H) 12/11/2022   MPG 131 12/11/2022   MPG 111.15 10/23/2018   Lab Results  Component Value Date   PROLACTIN 34.3 (H) 12/30/2022   PROLACTIN 13.0 12/11/2022   Lab Results  Component Value Date   CHOL 228 (H) 12/11/2022   TRIG 122 12/11/2022   HDL 91 12/11/2022   CHOLHDL 2.5 12/11/2022   VLDL 24 12/11/2022   LDLCALC 113 (H) 12/11/2022   LDLCALC 95 10/23/2018   Physical Findings: AIMS: 0 ,    CIWA:    COWS:     Musculoskeletal: Strength & Muscle Tone: within normal limits Gait & Station: normal Patient leans: N/A  Psychiatric Specialty Exam:  Presentation  General Appearance:  Appropriate for Environment  Eye Contact: Fair  Speech: Clear and Coherent  Speech Volume: Normal  Handedness: Right   Mood and Affect  Mood: Good  Affect: Labile, inappropriate  Thought Process  Thought Processes: Coherent  Descriptions of Associations:Tangential  Orientation:Partial  Thought Content:Illogical  History of Schizophrenia/Schizoaffective disorder:Yes  Duration of Psychotic Symptoms:Greater than six  months  Hallucinations:Couldn't assess  Ideas of Reference:None  Suicidal Thoughts:Suicidal Thoughts:  No   Homicidal Thoughts:Homicidal Thoughts: No   Sensorium  Memory: Couldn't assess  Judgment: Poor  Insight: Poor   Executive Functions  Concentration: Poor  Attention Span: Poor  Recall: Poor  Fund of Knowledge: Poor  Language: Poor   Psychomotor Activity  Psychomotor Activity: Psychomotor Activity: Normal   Assets  Assets: Social Support; Resilience   Sleep  Sleep: Sleep: Fair   Physical Exam: Constitutional:      Appearance: Normal appearance.  HENT:     Head: Normocephalic and atraumatic.  Eyes:     Extraocular Movements: Extraocular movements intact.  Cardiovascular:     Rate and Rhythm: Normal rate and regular rhythm.  Pulmonary:     Effort: Pulmonary effort is normal.   Musculoskeletal:        General: Normal range of motion.     Cervical back: Normal range of motion.  Skin:    General: Skin is warm and dry.   Review of Systems  Constitutional: Negative.   HENT: Negative.    Eyes: Negative.   Respiratory: Negative.    Cardiovascular: Negative.   Gastrointestinal: Negative.   Skin: Negative.   Neurological: Negative.  Blood pressure 114/62, pulse 72, temperature 98.2 F (36.8 C), temperature source Oral, resp. rate 18, height 5\' 8"  (1.727 m), weight 101.2 kg, SpO2 100%. Body mass index is 33.91 kg/m.   Treatment Plan Summary: Daily contact with patient to assess and evaluate symptoms and progress in treatment and Medication management  Plan:  Patient exhibited thought blocking and was mostly mute during most of the assessment.  When patient was able to communicate, patient provided bizarre responses to the questions addressed to her.  Patient to remain on her current psychiatric medication regimen.  Patient to continue to be evaluated and assessed to determine efficacy of treatment.  Safety and Monitoring: Voluntary  admission to inpatient psychiatric unit for safety, stabilization and treatment Daily contact with patient to assess and evaluate symptoms and progress in treatment Patient's case to be discussed in multi-disciplinary team meeting Observation Level : q15 minute checks Vital signs: q12 hours Precautions: Safety  Diagnosis: Principal Problem:   Schizoaffective disorder, bipolar type (HCC) Active Problems:   Hyperlipidemia   Type II diabetes mellitus (HCC)   GAD (generalized anxiety disorder)  #Schizoaffective disorder (bipolar type) Increase Haldol to 10 mg TID psychosis and mood stabilization. -Previously discontinued risperidone 2 mg  BID as psychosis was persistent -Increase Cogentin to 1 mg BID for EPS prophylaxis   Agitation protocol: -Diphenhydramine 50 mg p.o. or injection, 3 times daily as needed for agitation -Haloperidol 5 mg p.o. or injection, 3 times daily as needed for agitation -Lorazepam 2 mg p.o. or injection, 3 times daily as needed for agitation  #Generalized anxiety disorder -Continue hydroxyzine 50 mg 3 times daily as needed for anxiety -Continue propranolol 10 mg 2 times daily for anxiety  #Hyperlipidemia -Continue simvastatin 10 mg daily  Insomnia -Continue trazodone 150 mg at bedtime for the management of insomnia  Hypertension -Continue clonidine 0.1 mg every 6 hours as needed for systolic blood pressure greater than 160 or diastolic blood pressure less greater than 100  Low Vitamin D level -Continue Vitamin D 50.000 units weekly  As needed medications: -Patient to continue taking Tylenol 650 mg every 6 hours as needed for mild pain -Patient to continue taking Maalox/Mylanta 30 mL every 4 hours as needed for indigestion -Patient to continue taking Milk of Magnesia 30 mL as needed for mild constipation  Labs reviewed: Ammonia  level was found to be within normal limits (<10 umol/L).  Vitamin D level was found to be decreased (20.09 ng/mL),  supple.menting.  Prolactin level slightly elevated at 34. [name]  Discharge Planning: Social work and case management to assist with discharge planning and identification of hospital follow-up needs prior to discharge Estimated LOS: 5-7 days Discharge Concerns: Need to establish a safety plan; Medication compliance and effectiveness Discharge Goals: Return home with outpatient referrals for mental health follow-up including medication management/psychotherapy   I certify that inpatient services furnished can reasonably be expected to improve the patient's condition.    Starleen Blue, NP  Patient ID: Daliana Midou Kandis Nab, female   DOB: 1996/05/16, 28 y.o.   MRN: [id number]

## 2023-01-04 NOTE — Progress Notes (Signed)
   01/04/23 0300  Psych Admission Type (Psych Patients Only)  Admission Status Involuntary  Psychosocial Assessment  Patient Complaints None  Eye Contact Brief  Facial Expression Blank  Affect Blunted  Speech Soft  Interaction Hostile  Motor Activity Slow  Appearance/Hygiene Unremarkable  Behavior Characteristics Hypersexual;Impulsive  Mood Preoccupied  Thought Process  Coherency Disorganized  Content Preoccupation  Delusions None reported or observed  Perception Derealization  Hallucination None reported or observed  Judgment Impaired  Confusion None  Danger to Self  Current suicidal ideation? Denies  Danger to Others  Danger to Others None reported or observed

## 2023-01-05 DIAGNOSIS — F250 Schizoaffective disorder, bipolar type: Secondary | ICD-10-CM | POA: Diagnosis not present

## 2023-01-05 NOTE — Group Note (Signed)
Date:  01/05/2023 Time:  10:02 AM  Group Topic/Focus:  Goals Group:   The focus of this group is to help patients establish daily goals to achieve during treatment and discuss how the patient can incorporate goal setting into their daily lives to aide in recovery. Orientation:   The focus of this group is to educate the patient on the purpose and policies of crisis stabilization and provide a format to answer questions about their admission.  The group details unit policies and expectations of patients while admitted.    Participation Level:  None  Additional Comments:  Pt did not share anything during group.   Deforest Hoyles Landis Cassaro 01/05/2023, 10:02 AM

## 2023-01-05 NOTE — Progress Notes (Signed)
Pt remains 1:1 at this time. Pt spent time in day room and in room, remains compliant however requires redirection and encouragement for compliance. Intrusive towards peers. Pt ate most of lunch.

## 2023-01-05 NOTE — Progress Notes (Signed)
1:1 nursing note - Patient is currently asleep. Writer sat with patient for a while to give sitter a break. She was constantly singing, dancing and going to the bathroom fixing her hair. She tried to Recruitment consultant into talking to her and would laugh and start back singing again. She eventually fell back off to sleep and is resting now. No distress noted and sitter at bedside.

## 2023-01-05 NOTE — Progress Notes (Signed)
[facility] MD Progress Note  01/05/2023 4:03 PM Barbara George  MRN:  [id number]  Reason For Admission: Barbara George is a 27 year old African-American female with a past psychiatric history significant for schizoaffective disorder (bipolar type) who was admitted to [facility] from [facility], ED after presenting to the ED on 7/25 by her husband and father because she stopped eating, stopped talking, and became more withdrawn the day prior.  Per patient's father, history of nonmedication compliance and patient typically after discharge, which always leads to a relapse in her symptoms.  Patient was already receiving transferred back to [facility] under IVC for treatment and stabilization of her mental status.  24 hr chart review: V/S WNL. Pt is taking meds as ordered. Agitation reported by nursing staff overnight.  Also exhibited some manic type behaviors overnight as per nursing documentation; dancing and singing going to the bathroom constantly, extremely talkative and laughing. One-on-one staff monitoring maintained overnight due to manic type symptoms and sexually inappropriate behaviors.  Patient assessment note:  During encounter with patient, she presents with visual hallucinations of "the garden of [place]", states this as she stares out the window of her room at the green trees at the back of the [facility] building.  She perseverates about having her own guardian angels, one on the left shoulder and the other one on the right shoulder.  She talks about once being an angel herself, and currently existing in human form. She is disorganized, tangential in her response to questions, presents with flight of ideas regarding unrelated topics, and is unable to focus, and paces restlessly in her room as she talks to Clinical research associate, and at one point threatens to Surveyor, quantity, talks about wanting to go back to her "Vill[place]" etc.   Patient denies SI/HI, denies AH when asked questions regarding  appetite,.  She states that "I'm eating my own faith", then states that it is Ramadan, and she is supposed to be fasting. She reports a good sleep quality last night, denies medication related side effects. No TD/EPS type symptoms found on assessment, and pt denies any feelings of stiffness. AIMS: 0.  Haldol was increased yesterday to 10 mg 3 times daily for psychosis.  We will keep medication at current dose, and we will consider switching medication to Thorazine tomorrow if no improvement is achieved by them.  As per staff, patient has slightly improved by being more calm since Haldol was increased to three times daily yesterday.  We will continue medications as listed below, and  revisit medication adjustments tomorrow. No TD/EPS type symptoms found on assessment, and pt denies any feelings of stiffness. AIMS: 0. Repeat EKG ordered in the context of increase in Haldol dose.    1:1 staff monitoring remains necessary at this time due to hypersexual type behaviors.  Patient is in need of multiple verbal redirections, and attempts to get into other female patients rooms.  Principal Problem: Schizoaffective disorder, bipolar type (HCC) Diagnosis: Principal Problem:   Schizoaffective disorder, bipolar type (HCC) Active Problems:   Hyperlipidemia   Type II diabetes mellitus (HCC)   GAD (generalized anxiety disorder)  Total Time spent with patient: 35 minutes   Past Psychiatric History:  See H&P  Past Medical History:  Past Medical History:  Diagnosis Date   Bipolar 1 disorder (HCC)    Depression    Diabetes mellitus without complication (HCC)    Type 2 (Pre)   Schizophrenia (HCC)    History reviewed. No pertinent surgical history. Family  History:  Family History  Problem Relation Age of Onset   Hypertension Father    Family Psychiatric  History:  See H&P  Social History:  Social History   Substance and Sexual Activity  Alcohol Use No   Alcohol/week: 0.0 standard drinks of alcohol      Social History   Substance and Sexual Activity  Drug Use No    Social History   Socioeconomic History   Marital status: Married    Spouse name: Amadou   Number of children: Not on file   Years of education: Not on file   Highest education level: Associate degree: occupational, Scientist, product/process development, or vocational program  Occupational History   Occupation: unemployed  Tobacco Use   Smoking status: Never   Smokeless tobacco: Never  Vaping Use   Vaping status: Never Used  Substance and Sexual Activity   Alcohol use: No    Alcohol/week: 0.0 standard drinks of alcohol   Drug use: No   Sexual activity: Yes    Birth control/protection: None  Other Topics Concern   Not on file  Social History Narrative   ** Merged History Encounter **       Social Determinants of Health   Financial Resource Strain: Not on file  Food Insecurity: Patient Declined (12/28/2022)   Hunger Vital Sign    Worried About Running Out of Food in the Last Year: Patient declined    Ran Out of Food in the Last Year: Patient declined  Transportation Needs: Patient Declined (12/28/2022)   PRAPARE - Administrator, Civil Service (Medical): Patient declined    Lack of Transportation (Non-Medical): Patient declined  Physical Activity: Inactive (04/03/2019)   Exercise Vital Sign    Days of Exercise per Week: 0 days    Minutes of Exercise per Session: 0 min  Stress: Not on file  Social Connections: Socially Integrated (04/03/2019)   Social Connection and Isolation Panel [NHANES]    Frequency of Communication with Friends and Family: More than three times a week    Frequency of Social Gatherings with Friends and Family: Once a week    Attends Religious Services: More than 4 times per year    Active Member of [employer] or Organizations: Yes    Attends Engineer, structural: Not on file    Marital Status: Married   Sleep: Unable to assess  Appetite: Unable to assess  Current Medications: Current  Facility-Administered Medications  Medication Dose Route Frequency Provider Last Rate Last Admin   acetaminophen (TYLENOL) tablet 650 mg  650 mg Oral Q6H PRN Massengill, Harrold Donath, MD   650 mg at 12/31/22 1456   alum & mag hydroxide-simeth (MAALOX/MYLANTA) 200-200-20 MG/5ML suspension 30 mL  30 mL Oral Q4H PRN Massengill, Harrold Donath, MD       benztropine (COGENTIN) tablet 1 mg  1 mg Oral BID Kaleeyah Cuffie, NP   1 mg at 01/05/23 0758   cloNIDine (CATAPRES) tablet 0.1 mg  0.1 mg Oral Q6H PRN Abbott Pao, Nadir, MD       diphenhydrAMINE (BENADRYL) capsule 50 mg  50 mg Oral TID PRN Phineas Inches, MD   50 mg at 01/02/23 1122   Or   diphenhydrAMINE (BENADRYL) injection 50 mg  50 mg Intramuscular TID PRN Massengill, Harrold Donath, MD   50 mg at 01/03/23 1049   haloperidol (HALDOL) 2 MG/ML solution 10 mg  10 mg Oral TID Starleen Blue, NP   10 mg at 01/05/23 1437   haloperidol (HALDOL) tablet 5 mg  5 mg Oral TID PRN Phineas Inches, MD   5 mg at 01/02/23 1122   Or   haloperidol lactate (HALDOL) injection 5 mg  5 mg Intramuscular TID PRN Massengill, Harrold Donath, MD   5 mg at 01/03/23 1049   hydrOXYzine (ATARAX) tablet 50 mg  50 mg Oral TID PRN Phineas Inches, MD   50 mg at 01/04/23 2320   LORazepam (ATIVAN) tablet 2 mg  2 mg Oral TID PRN Phineas Inches, MD   2 mg at 01/02/23 1122   Or   LORazepam (ATIVAN) injection 2 mg  2 mg Intramuscular TID PRN Phineas Inches, MD   2 mg at 01/03/23 1049   magnesium hydroxide (MILK OF MAGNESIA) suspension 30 mL  30 mL Oral Daily PRN Massengill, Harrold Donath, MD       propranolol (INDERAL) tablet 10 mg  10 mg Oral BID Starleen Blue, NP   10 mg at 01/05/23 0759   simvastatin (ZOCOR) tablet 10 mg  10 mg Oral q1800 Starleen Blue, NP   10 mg at 01/04/23 1740   traZODone (DESYREL) tablet 150 mg  150 mg Oral QHS Massengill, Harrold Donath, MD   150 mg at 01/04/23 2025   Vitamin D (Ergocalciferol) (DRISDOL) 1.25 MG (50000 UNIT) capsule 50,000 Units  50,000 Units Oral Q7 days Starleen Blue,  NP   50,000 Units at 01/01/23 0945    Lab Results:  Results for orders placed or performed during the hospital encounter of 12/28/22 (from the past 48 hour(s))  TSH     Status: None   Collection Time: 01/05/23  7:41 AM  Result Value Ref Range   TSH 0.606 0.350 - 4.500 uIU/mL    Comment: Performed by a 3rd Generation assay with a functional sensitivity of <=0.01 uIU/mL. Performed at [facility], [address], [place], [place] [address]   Lipid panel     Status: None   Collection Time: 01/05/23  7:41 AM  Result Value Ref Range   Cholesterol 137 0 - 200 mg/dL   Triglycerides 62 <644 mg/dL   HDL 62 >03 mg/dL   Total CHOL/HDL Ratio 2.2 RATIO   VLDL 12 0 - 40 mg/dL   LDL Cholesterol 63 0 - 99 mg/dL    Comment:        Total Cholesterol/HDL:CHD Risk Coronary Heart Disease Risk Table                     Men   Women  1/2 Average Risk   3.4   3.3  Average Risk       5.0   4.4  2 X Average Risk   9.6   7.1  3 X Average Risk  23.4   11.0        Use the calculated Patient Ratio above and the CHD Risk Table to determine the patient's CHD Risk.        ATP III CLASSIFICATION (LDL):  <100     mg/dL   Optimal  474-259  mg/dL   Near or Above                    Optimal  130-159  mg/dL   Borderline  563-875  mg/dL   High  >643     mg/dL   Very High Performed at [facility], [address] [place], [place] [address]   hCG, quantitative, pregnancy     Status: None   Collection Time: 01/05/23  7:41 AM  Result Value Ref Range   hCG, Beta Chain, Quant, S <1 <5 mIU/mL    Comment:          GEST. AGE      CONC.  (mIU/mL)   <=1 WEEK        5 - 50     2 WEEKS       50 - 500     3 WEEKS       100 - 10,000     4 WEEKS     1,000 - 30,000     5 WEEKS     3,500 - 115,000   6-8 WEEKS     12,000 - 270,000    12 WEEKS     15,000 - 220,000        FEMALE AND NON-PREGNANT FEMALE:     LESS THAN 5 mIU/mL Performed at [facility], [address] [place], [place] [address]      Blood Alcohol level:  Lab Results  Component Value Date   ETH <10 12/27/2022   ETH <10 12/11/2022    Metabolic Disorder Labs: Lab Results  Component Value Date   HGBA1C 6.2 (H) 12/11/2022   MPG 131 12/11/2022   MPG 111.15 10/23/2018   Lab Results  Component Value Date   PROLACTIN 34.3 (H) 12/30/2022   PROLACTIN 13.0 12/11/2022   Lab Results  Component Value Date   CHOL 137 01/05/2023   TRIG 62 01/05/2023   HDL 62 01/05/2023   CHOLHDL 2.2 01/05/2023   VLDL 12 01/05/2023   LDLCALC 63 01/05/2023   LDLCALC 113 (H) 12/11/2022   Physical Findings: AIMS: 0 ,    CIWA:    COWS:     Musculoskeletal: Strength & Muscle Tone: within normal limits Gait & Station: normal Patient leans: N/A  Psychiatric Specialty Exam:  Presentation  General Appearance:  Casual  Eye Contact: Fair  Speech: Clear and Coherent  Speech Volume: Increased  Handedness: Right   Mood and Affect  Mood: Good  Affect: Labile, inappropriate  Thought Process  Thought Processes: Disorganized  Descriptions of Associations:Tangential  Orientation:Partial  Thought Content:Perseveration; Tangential; Scattered  History of Schizophrenia/Schizoaffective disorder:Yes  Duration of Psychotic Symptoms:Greater than six months  Hallucinations:Couldn't assess  Ideas of Reference:Delusions  Suicidal Thoughts:Suicidal Thoughts: No   Homicidal Thoughts:Homicidal Thoughts: No   Sensorium  Memory: Couldn't assess  Judgment: Poor  Insight: Poor   Executive Functions  Concentration: Poor  Attention Span: Poor  Recall: Poor  Fund of Knowledge: Poor  Language: Poor   Psychomotor Activity  Psychomotor Activity: Psychomotor Activity: Normal   Assets  Assets: Resilience   Sleep  Sleep: Sleep: Fair   Physical Exam: Constitutional:      Appearance: Normal appearance.  HENT:     Head: Normocephalic and  atraumatic.  Eyes:     Extraocular Movements: Extraocular movements intact.  Cardiovascular:     Rate and Rhythm: Normal rate and regular rhythm.  Pulmonary:     Effort: Pulmonary effort is normal.   Musculoskeletal:        General: Normal range of motion.     Cervical back: Normal range of motion.  Skin:    General: Skin is warm and dry.   Review of Systems  Constitutional: Negative.   HENT: Negative.    Eyes: Negative.   Respiratory: Negative.    Cardiovascular: Negative.   Gastrointestinal: Negative.   Skin: Negative.   Neurological: Negative.  Blood pressure 114/62, pulse 72, temperature  98.2 F (36.8 C), temperature source Oral, resp. rate 18, height 5\' 8"  (1.727 m), weight 101.2 kg, SpO2 100%. Body mass index is 33.91 kg/m.   Treatment Plan Summary: Daily contact with patient to assess and evaluate symptoms and progress in treatment and Medication management  Plan:  Patient exhibited thought blocking and was mostly mute during most of the assessment.  When patient was able to communicate, patient provided bizarre responses to the questions addressed to her.  Patient to remain on her current psychiatric medication regimen.  Patient to continue to be evaluated and assessed to determine efficacy of treatment.  Safety and Monitoring: Voluntary admission to inpatient psychiatric unit for safety, stabilization and treatment Daily contact with patient to assess and evaluate symptoms and progress in treatment Patient's case to be discussed in multi-disciplinary team meeting Observation Level : q15 minute checks Vital signs: q12 hours Precautions: Safety  Diagnosis: Principal Problem:   Schizoaffective disorder, bipolar type (HCC) Active Problems:   Hyperlipidemia   Type II diabetes mellitus (HCC)   GAD (generalized anxiety disorder)  #Schizoaffective disorder (bipolar type) -Continue Haldol 10 mg TID psychosis and mood stabilization. -Previously discontinued  risperidone 2 mg  BID as psychosis was persistent -Continue Cogentin to 1 mg BID for EPS prophylaxis   Agitation protocol: -Diphenhydramine 50 mg p.o. or injection, 3 times daily as needed for agitation -Haloperidol 5 mg p.o. or injection, 3 times daily as needed for agitation -Lorazepam 2 mg p.o. or injection, 3 times daily as needed for agitation  #Generalized anxiety disorder -Continue hydroxyzine 50 mg 3 times daily as needed for anxiety -Continue propranolol 10 mg 2 times daily for anxiety  #Hyperlipidemia -Continue simvastatin 10 mg daily  Insomnia -Continue trazodone 150 mg at bedtime for the management of insomnia  Hypertension -Continue clonidine 0.1 mg every 6 hours as needed for systolic blood pressure greater than 160 or diastolic blood pressure less greater than 100  Low Vitamin D level -Continue Vitamin D 50.000 units weekly  As needed medications: -Patient to continue taking Tylenol 650 mg every 6 hours as needed for mild pain -Patient to continue taking Maalox/Mylanta 30 mL every 4 hours as needed for indigestion -Patient to continue taking Milk of Magnesia 30 mL as needed for mild constipation  Labs reviewed: Ammonia level was found to be within normal limits (<10 umol/L).  Vitamin D level was found to be decreased (20.09 ng/mL), supple.menting.  Prolactin level slightly elevated at 34. [name]  Discharge Planning: Social work and case management to assist with discharge planning and identification of hospital follow-up needs prior to discharge Estimated LOS: 5-7 days Discharge Concerns: Need to establish a safety plan; Medication compliance and effectiveness Discharge Goals: Return home with outpatient referrals for mental health follow-up including medication management/psychotherapy   I certify that inpatient services furnished can reasonably be expected to improve the patient's condition.    Starleen Blue, NP  Patient ID: Barbara George, female    DOB: 1996-05-29, 27 y.o.   MRN: [id number]

## 2023-01-05 NOTE — Progress Notes (Signed)
1:1 Nursing note - Patient is currently asleep with no distress noted. 1:1 continues with sitter by bedside.

## 2023-01-05 NOTE — Group Note (Signed)
Date:  01/05/2023 Time:  9:39 PM  Group Topic/Focus:  Wrap-Up Group:   The focus of this group is to help patients review their daily goal of treatment and discuss progress on daily workbooks.    Participation Level:  Did Not Attend  Participation Quality:   N/A  Affect:   N/A  Cognitive:   N/A  Insight: None  Engagement in Group:  None  Modes of Intervention:   N/A  Additional Comments:  Patient did not attend  Kennieth Francois 01/05/2023, 9:39 PM

## 2023-01-05 NOTE — Progress Notes (Signed)
Nursing 1:1 note D:Pt observed sleeping in bed with eyes closed. RR even and unlabored. No distress noted. A: 1:1 observation continues for safety  R: Pt remains safe  

## 2023-01-05 NOTE — Progress Notes (Signed)
Pt medication compliant this morning. Pt attended group. During medication pass pt unzipped her dress down to expose her breasts. Pt compliant with redirection to zip her top correctly. 1:1 continues.

## 2023-01-05 NOTE — Progress Notes (Signed)
   01/05/23 2006  Psych Admission Type (Psych Patients Only)  Admission Status Involuntary  Psychosocial Assessment  Patient Complaints None  Eye Contact Staring  Facial Expression Animated  Affect Blunted  Speech Soft  Interaction Childlike  Motor Activity Other (Comment) (wnl)  Appearance/Hygiene Layered clothes  Behavior Characteristics Hypersexual;Impulsive  Mood Preoccupied  Thought Process  Coherency Disorganized  Content Preoccupation  Delusions None reported or observed  Perception WDL  Hallucination None reported or observed  Judgment Poor  Confusion None  Danger to Self  Current suicidal ideation? Denies  Self-Injurious Behavior No self-injurious ideation or behavior indicators observed or expressed   Danger to Others  Danger to Others None reported or observed   Progress note   D: Pt seen in room with sitter. Pt denies HI, AVH. When asked about SI, pt looks surprised. "What is this? What does she mean? What do you mean?" At first, pt says "maybe" that she wanted to harm herself. When asked what she would do, she replied, "I would drink lots of water because it is good for you." She then rubs her abdomen. Pt begins to smile and then laugh inappropriately. Pt walks in the bathroom and then sits on the bed and stares at this writer. Pt rates pain  0/10. Pt rates anxiety  0/10 and depression  0/10. Pt stops answering questions. No other concerns noted at this time.  A: Pt provided support and encouragement. Pt given scheduled medication as prescribed. PRNs as appropriate. Q15 min checks for safety.   R: Pt safe on the unit. Will continue to monitor.

## 2023-01-06 DIAGNOSIS — F250 Schizoaffective disorder, bipolar type: Secondary | ICD-10-CM | POA: Diagnosis not present

## 2023-01-06 MED ORDER — CHLORPROMAZINE HCL 25 MG PO TABS
50.0000 mg | ORAL_TABLET | Freq: Every evening | ORAL | Status: DC | PRN
  Administered 2023-01-06 – 2023-01-08 (×2): 50 mg via ORAL
  Filled 2023-01-06 (×2): qty 2

## 2023-01-06 MED ORDER — OLANZAPINE 5 MG PO TBDP
5.0000 mg | ORAL_TABLET | Freq: Two times a day (BID) | ORAL | Status: DC
  Filled 2023-01-06 (×4): qty 1

## 2023-01-06 MED ORDER — CHLORPROMAZINE HCL 25 MG PO TABS
50.0000 mg | ORAL_TABLET | Freq: Three times a day (TID) | ORAL | Status: DC | PRN
  Administered 2023-01-07 – 2023-01-15 (×5): 50 mg via ORAL
  Filled 2023-01-06 (×3): qty 2

## 2023-01-06 MED ORDER — CHLORPROMAZINE HCL 25 MG/ML IJ SOLN
25.0000 mg | Freq: Three times a day (TID) | INTRAMUSCULAR | Status: DC | PRN
  Administered 2023-01-09 – 2023-01-10 (×2): 25 mg via INTRAMUSCULAR
  Filled 2023-01-06: qty 1

## 2023-01-06 MED ORDER — NICOTINE POLACRILEX 2 MG MT GUM
2.0000 mg | CHEWING_GUM | OROMUCOSAL | Status: DC | PRN
  Administered 2023-01-07 – 2023-01-15 (×3): 2 mg via ORAL
  Filled 2023-01-06 (×4): qty 1

## 2023-01-06 MED ORDER — CHLORPROMAZINE HCL 50 MG PO TABS
50.0000 mg | ORAL_TABLET | Freq: Three times a day (TID) | ORAL | Status: DC
  Administered 2023-01-06 – 2023-01-09 (×8): 50 mg via ORAL
  Filled 2023-01-06 (×2): qty 2
  Filled 2023-01-06 (×15): qty 1

## 2023-01-06 NOTE — Progress Notes (Signed)
Pt singing in room. Pt ate dinner. Pt presents cooperative with calm affect at this time. 1:1 continues for safety.

## 2023-01-06 NOTE — Progress Notes (Addendum)
Patient disorganized/irritable while speaking with NP this morning. Patient became upset when being asked questions, talking to her skirt, tangential, and cursed at NP and MHT. Patient given haldol, ativan, and benadryl po prn. Patient compliant with medications. Patient able to be de-escalated and back in room under 1:1 supervision. No s/s of current distress.

## 2023-01-06 NOTE — Progress Notes (Signed)
Pt sleeping at this time. Pt remains 1:1 with staff.

## 2023-01-06 NOTE — Progress Notes (Cosign Needed Addendum)
[facility] MD Progress Note  01/06/2023 11:34 AM Lamae Midou Hamadou  MRN:  [id number]  Reason For Admission: Kaylianna Midou Hamadou is a 27 year old African-American female with a past psychiatric history significant for schizoaffective disorder (bipolar type) who was admitted to [facility] from [facility], ED after presenting to the ED on 7/25 by her husband and father because she stopped eating, stopped talking, and became more withdrawn the day prior.  Per patient's father, history of nonmedication compliance and patient typically after discharge, which always leads to a relapse in her symptoms.  Patient was already receiving transferred back to [facility] under IVC for treatment and stabilization of her mental status.  24 hr chart review: V/S WNL. Pt is taking meds as ordered. Slept for 9.5 hrs as per nursing documentation. Remains on 1:1 staff monitoring for sexually inappropriate & intrusive behaviors.   Patient assessment note:  On assessment today, pt is agitated, is dancing, singing and laughing inappropriately, and alternates with cussing at writer and her 1:1 staff, and is angry at questions being asked to her during assessment. At one point during assessment, she pulled down her skirt, threw it to the ground, and is talking to the skirt. She is disorganized, tangential, answers questions in a non linear manner, but just stares at Clinical research associate when questions regarding AH, VH and paranoia are asked. She however states that she has super powers names "water, fire and Jesus".  Mood is irritable, dysphoric, and angry and thought contents are illogical.  Since psychosis is remaining persistent with Haldol 10 mg TID, we are discontinuing Haldol and starting Thorazine 50 mg daily for treatment of persistent psychosis. Discontinuing PRN Haldol for agitation, starting agitation protocol of Thorazine 50 mg PRN TID. Continuing other medications as listed below. Pt denies SI/HI. Denies being in  any physical pain today. No TD/EPS type symptoms found on assessment, and pt denies any feelings of stiffness. AIMS: 0.   Principal Problem: Schizoaffective disorder, bipolar type (HCC) Diagnosis: Principal Problem:   Schizoaffective disorder, bipolar type (HCC) Active Problems:   Hyperlipidemia   Type II diabetes mellitus (HCC)   GAD (generalized anxiety disorder)  Total Time spent with patient: 35 minutes   Past Psychiatric History:  See H&P  Past Medical History:  Past Medical History:  Diagnosis Date   Bipolar 1 disorder (HCC)    Depression    Diabetes mellitus without complication (HCC)    Type 2 (Pre)   Schizophrenia (HCC)    History reviewed. No pertinent surgical history. Family History:  Family History  Problem Relation Age of Onset   Hypertension Father    Family Psychiatric  History:  See H&P  Social History:  Social History   Substance and Sexual Activity  Alcohol Use No   Alcohol/week: 0.0 standard drinks of alcohol     Social History   Substance and Sexual Activity  Drug Use No    Social History   Socioeconomic History   Marital status: Married    Spouse name: Amadou   Number of children: Not on file   Years of education: Not on file   Highest education level: Associate degree: occupational, Scientist, product/process development, or vocational program  Occupational History   Occupation: unemployed  Tobacco Use   Smoking status: Never   Smokeless tobacco: Never  Vaping Use   Vaping status: Never Used  Substance and Sexual Activity   Alcohol use: No    Alcohol/week: 0.0 standard drinks of alcohol   Drug use: No  Sexual activity: Yes    Birth control/protection: None  Other Topics Concern   Not on file  Social History Narrative   ** Merged History Encounter **       Social Determinants of Health   Financial Resource Strain: Not on file  Food Insecurity: Patient Declined (12/28/2022)   Hunger Vital Sign    Worried About Running Out of Food in the Last Year:  Patient declined    Ran Out of Food in the Last Year: Patient declined  Transportation Needs: Patient Declined (12/28/2022)   PRAPARE - Administrator, Civil Service (Medical): Patient declined    Lack of Transportation (Non-Medical): Patient declined  Physical Activity: Inactive (04/03/2019)   Exercise Vital Sign    Days of Exercise per Week: 0 days    Minutes of Exercise per Session: 0 min  Stress: Not on file  Social Connections: Socially Integrated (04/03/2019)   Social Connection and Isolation Panel [NHANES]    Frequency of Communication with Friends and Family: More than three times a week    Frequency of Social Gatherings with Friends and Family: Once a week    Attends Religious Services: More than 4 times per year    Active Member of [employer] or Organizations: Yes    Attends Engineer, structural: Not on file    Marital Status: Married   Sleep: Unable to assess  Appetite: Unable to assess  Current Medications: Current Facility-Administered Medications  Medication Dose Route Frequency Provider Last Rate Last Admin   acetaminophen (TYLENOL) tablet 650 mg  650 mg Oral Q6H PRN Massengill, Harrold Donath, MD   650 mg at 01/06/23 1049   alum & mag hydroxide-simeth (MAALOX/MYLANTA) 200-200-20 MG/5ML suspension 30 mL  30 mL Oral Q4H PRN Massengill, Harrold Donath, MD       chlorproMAZINE (THORAZINE) tablet 50 mg  50 mg Oral TID PRN Phineas Inches, MD       Or   chlorproMAZINE (THORAZINE) injection 25 mg  25 mg Intramuscular TID PRN Massengill, Harrold Donath, MD       chlorproMAZINE (THORAZINE) tablet 50 mg  50 mg Oral Q8H Massengill, Nathan, MD       chlorproMAZINE (THORAZINE) tablet 50 mg  50 mg Oral QHS PRN Massengill, Nathan, MD       cloNIDine (CATAPRES) tablet 0.1 mg  0.1 mg Oral Q6H PRN Abbott Pao, Nadir, MD       diphenhydrAMINE (BENADRYL) capsule 50 mg  50 mg Oral TID PRN Massengill, Harrold Donath, MD   50 mg at 01/06/23 1038   Or   diphenhydrAMINE (BENADRYL) injection 50 mg  50 mg  Intramuscular TID PRN Massengill, Harrold Donath, MD   50 mg at 01/03/23 1049   hydrOXYzine (ATARAX) tablet 50 mg  50 mg Oral TID PRN Phineas Inches, MD   50 mg at 01/06/23 0733   LORazepam (ATIVAN) tablet 2 mg  2 mg Oral TID PRN Phineas Inches, MD   2 mg at 01/06/23 1038   Or   LORazepam (ATIVAN) injection 2 mg  2 mg Intramuscular TID PRN Massengill, Harrold Donath, MD   2 mg at 01/03/23 1049   magnesium hydroxide (MILK OF MAGNESIA) suspension 30 mL  30 mL Oral Daily PRN Massengill, Harrold Donath, MD       propranolol (INDERAL) tablet 10 mg  10 mg Oral BID Starleen Blue, NP   10 mg at 01/06/23 0734   simvastatin (ZOCOR) tablet 10 mg  10 mg Oral q1800 Starleen Blue, NP   10 mg at 01/05/23 1702  Vitamin D (Ergocalciferol) (DRISDOL) 1.25 MG (50000 UNIT) capsule 50,000 Units  50,000 Units Oral Q7 days Starleen Blue, NP   50,000 Units at 01/01/23 0945    Lab Results:  Results for orders placed or performed during the hospital encounter of 12/28/22 (from the past 48 hour(s))  TSH     Status: None   Collection Time: 01/05/23  7:41 AM  Result Value Ref Range   TSH 0.606 0.350 - 4.500 uIU/mL    Comment: Performed by a 3rd Generation assay with a functional sensitivity of <=0.01 uIU/mL. Performed at [facility], [address], [place], [place] [address]   Lipid panel     Status: None   Collection Time: 01/05/23  7:41 AM  Result Value Ref Range   Cholesterol 137 0 - 200 mg/dL   Triglycerides 62 <469 mg/dL   HDL 62 >62 mg/dL   Total CHOL/HDL Ratio 2.2 RATIO   VLDL 12 0 - 40 mg/dL   LDL Cholesterol 63 0 - 99 mg/dL    Comment:        Total Cholesterol/HDL:CHD Risk Coronary Heart Disease Risk Table                     Men   Women  1/2 Average Risk   3.4   3.3  Average Risk       5.0   4.4  2 X Average Risk   9.6   7.1  3 X Average Risk  23.4   11.0        Use the calculated Patient Ratio above and the CHD Risk Table to determine the patient's CHD Risk.        ATP III  CLASSIFICATION (LDL):  <100     mg/dL   Optimal  952-841  mg/dL   Near or Above                    Optimal  130-159  mg/dL   Borderline  324-401  mg/dL   High  >027     mg/dL   Very High Performed at [facility], [address], [place], [place] [address]   hCG, quantitative, pregnancy     Status: None   Collection Time: 01/05/23  7:41 AM  Result Value Ref Range   hCG, Beta Chain, Quant, S <1 <5 mIU/mL    Comment:          GEST. AGE      CONC.  (mIU/mL)   <=1 WEEK        5 - 50     2 WEEKS       50 - 500     3 WEEKS       100 - 10,000     4 WEEKS     1,000 - 30,000     5 WEEKS     3,500 - 115,000   6-8 WEEKS     12,000 - 270,000    12 WEEKS     15,000 - 220,000        FEMALE AND NON-PREGNANT FEMALE:     LESS THAN 5 mIU/mL Performed at [facility], [address], [place], [place] [address]      Blood Alcohol level:  Lab Results  Component Value Date   [facility] <10 12/27/2022   ETH <10 12/11/2022    Metabolic Disorder Labs: Lab Results  Component Value Date   HGBA1C 6.2 (H)  12/11/2022   MPG 131 12/11/2022   MPG 111.15 10/23/2018   Lab Results  Component Value Date   PROLACTIN 34.3 (H) 12/30/2022   PROLACTIN 13.0 12/11/2022   Lab Results  Component Value Date   CHOL 137 01/05/2023   TRIG 62 01/05/2023   HDL 62 01/05/2023   CHOLHDL 2.2 01/05/2023   VLDL 12 01/05/2023   LDLCALC 63 01/05/2023   LDLCALC 113 (H) 12/11/2022   Physical Findings: AIMS: 0 ,    CIWA:    COWS:     Musculoskeletal: Strength & Muscle Tone: within normal limits Gait & Station: normal Patient leans: N/A  Psychiatric Specialty Exam:  Presentation  General Appearance:  Fairly Groomed  Eye Contact: Fair  Speech: Clear and Coherent  Speech Volume: Normal  Handedness: Right   Mood and Affect  Mood: Good  Affect: Labile, inappropriate  Thought Process  Thought Processes: Disorganized  Descriptions of  Associations:Tangential  Orientation:Partial  Thought Content:Illogical  History of Schizophrenia/Schizoaffective disorder:Yes  Duration of Psychotic Symptoms:Greater than six months  Hallucinations:Couldn't assess  Ideas of Reference:None  Suicidal Thoughts:Suicidal Thoughts: No   Homicidal Thoughts:Homicidal Thoughts: No   Sensorium  Memory: Couldn't assess  Judgment: Poor  Insight: Poor   Executive Functions  Concentration: Poor  Attention Span: Poor  Recall: Poor  Fund of Knowledge: Poor  Language: Poor   Psychomotor Activity  Psychomotor Activity: Psychomotor Activity: Normal   Assets  Assets: Resilience; Social Support   Sleep  Sleep: Sleep: Good   Physical Exam: Constitutional:      Appearance: Normal appearance.  HENT:     Head: Normocephalic and atraumatic.  Eyes:     Extraocular Movements: Extraocular movements intact.  Cardiovascular:     Rate and Rhythm: Normal rate and regular rhythm.  Pulmonary:     Effort: Pulmonary effort is normal.   Musculoskeletal:        General: Normal range of motion.     Cervical back: Normal range of motion.  Skin:    General: Skin is warm and dry.   Review of Systems  Constitutional: Negative.   HENT: Negative.    Eyes: Negative.   Respiratory: Negative.    Cardiovascular: Negative.   Gastrointestinal: Negative.   Skin: Negative.   Neurological: Negative.  Blood pressure 119/87, pulse 71, temperature 98 F (36.7 C), temperature source Oral, resp. rate 16, height 5\' 8"  (1.727 m), weight 101.2 kg, SpO2 100%. Body mass index is 33.91 kg/m.   Treatment Plan Summary: Daily contact with patient to assess and evaluate symptoms and progress in treatment and Medication management  Plan:  Patient exhibited thought blocking and was mostly mute during most of the assessment.  When patient was able to communicate, patient provided bizarre responses to the questions addressed to her.   Patient to remain on her current psychiatric medication regimen.  Patient to continue to be evaluated and assessed to determine efficacy of treatment.  Safety and Monitoring: Voluntary admission to inpatient psychiatric unit for safety, stabilization and treatment Daily contact with patient to assess and evaluate symptoms and progress in treatment Patient's case to be discussed in multi-disciplinary team meeting Observation Level : q15 minute checks Vital signs: q12 hours Precautions: Safety  Diagnosis: Principal Problem:   Schizoaffective disorder, bipolar type (HCC) Active Problems:   Hyperlipidemia   Type II diabetes mellitus (HCC)   GAD (generalized anxiety disorder)  #Schizoaffective disorder (bipolar type) -Start Thorazine 50 mg daily for psychosis/mood stabilization -Discontinue Haldol 10 mg TID on 8/4 -Previously  discontinued risperidone 2 mg  BID as psychosis was persistent -Continue Cogentin to 1 mg BID for EPS prophylaxis   Agitation protocol: -Diphenhydramine 50 mg p.o. or injection, 3 times daily as needed for agitation -Start Thorazine 50 mg TID PRN for agitation -Discontinue Haloperidol 5 mg p.o. or injection, 3 times daily as needed for agitation -Lorazepam 2 mg p.o. or injection, 3 times daily as needed for agitation  #Generalized anxiety disorder -Continue hydroxyzine 50 mg 3 times daily as needed for anxiety -Continue propranolol 10 mg 2 times daily for anxiety  #Hyperlipidemia -Continue simvastatin 10 mg daily  Insomnia -Continue trazodone 150 mg at bedtime for the management of insomnia  Hypertension -Continue clonidine 0.1 mg every 6 hours as needed for systolic blood pressure greater than 160 or diastolic blood pressure less greater than 100  Low Vitamin D level -Continue Vitamin D 50.000 units weekly  As needed medications: -Patient to continue taking Tylenol 650 mg every 6 hours as needed for mild pain -Patient to continue taking Maalox/Mylanta  30 mL every 4 hours as needed for indigestion -Patient to continue taking Milk of Magnesia 30 mL as needed for mild constipation  Labs reviewed: Ammonia level was found to be within normal limits (<10 umol/L).  Vitamin D level was found to be decreased (20.09 ng/mL), supple.menting.  Prolactin level slightly elevated at 34. [name]  Discharge Planning: Social work and case management to assist with discharge planning and identification of hospital follow-up needs prior to discharge Estimated LOS: 5-7 days Discharge Concerns: Need to establish a safety plan; Medication compliance and effectiveness Discharge Goals: Return home with outpatient referrals for mental health follow-up including medication management/psychotherapy   I certify that inpatient services furnished can reasonably be expected to improve the patient's condition.    Starleen Blue, NP  Patient ID: Nanami Midou Kandis Nab, female   DOB: March 03, 1996, 27 y.o.   MRN: [id number]

## 2023-01-06 NOTE — Group Note (Signed)
Date:  01/06/2023 Time:  1:25 PM  Group Topic/Focus:  Goals Group:   The focus of this group is to help patients establish daily goals to achieve during treatment and discuss how the patient can incorporate goal setting into their daily lives to aide in recovery.    Participation Level:  Active  Participation Quality:  Attentive  Affect:  Appropriate  Cognitive:  Appropriate  Insight: Appropriate  Engagement in Group:  Engaged  Modes of Intervention:  Discussion  Additional Comments:  Patient attended goals group and was attentive the duration of it. Patient's goal was to take all of her medication.                                       Maiya Kates T Royce Sciara 01/06/2023, 1:25 PM

## 2023-01-06 NOTE — Progress Notes (Signed)
Nursing 1:1 note D:Pt observed sleeping in bed with eyes closed. RR even and unlabored. No distress noted. A: 1:1 observation continues for safety  R: Pt remains safe  

## 2023-01-06 NOTE — Progress Notes (Signed)
   01/06/23 0555  15 Minute Checks  Location Bedroom  Visual Appearance Calm  Behavior Sleeping  Sleep (Behavioral Health Patients Only)  Calculate sleep? (Click Yes once per 24 hr at 0600 safety check) Yes  Documented sleep last 24 hours 9.5

## 2023-01-06 NOTE — Group Note (Signed)
Date:  01/06/2023 Time:  8:51 PM  Group Topic/Focus:  Wrap-Up Group:   The focus of this group is to help patients review their daily goal of treatment and discuss progress on daily workbooks.    Participation Level:  Minimal  Participation Quality:  Intrusive  Affect:  Labile  Cognitive:  Oriented  Insight: Limited  Engagement in Group:  Engaged  Modes of Intervention:  Education and Exploration  Additional Comments: Patient attended and participated in group tonight. She reports that her day was perfect. She had been fasting.  Lita Mains [facility] 01/06/2023, 8:51 PM

## 2023-01-06 NOTE — Progress Notes (Signed)
Pt remains on 1:1 at this time. Pt presents boisterous and labile. Pt redirected for cursing at staff and pushing STARR button in room. Pt throwing clothes onto floor as well. PRN medication administered.

## 2023-01-07 DIAGNOSIS — F250 Schizoaffective disorder, bipolar type: Secondary | ICD-10-CM | POA: Diagnosis not present

## 2023-01-07 NOTE — Progress Notes (Signed)
Pt A & O to self and place. Remains hyperactive, disorganized, childlike, intrusive and impulsive with inappropriate smiles, laughter on interactions. Will not offer peers' privacy while in medication line despite multiple prompts. Compliant with evening medications when offered with mouth check. Observed eating sugar packets this evening at the beginning of the, not cooperative with staff's redirections. 1:1 observations maintained with assigned staff in attendance at all times. Pt awake at this time.
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
## 2023-01-07 NOTE — BHH Group Notes (Signed)
Adult Psychoeducational Group Note  Date:  01/07/2023 Time:  11:20 AM  Group Topic/Focus:  Goals Group:   The focus of this group is to help patients establish daily goals to achieve during treatment and discuss how the patient can incorporate goal setting into their daily lives to aide in recovery. Orientation:   The focus of this group is to educate the patient on the purpose and policies of crisis stabilization and provide a format to answer questions about their admission.  The group details unit policies and expectations of patients while admitted.  Participation Level:  Active  Participation Quality:  Intrusive  Affect:  Irritable  Cognitive:  Disorganized and Confused  Insight: Lacking  Engagement in Group:  Engaged  Modes of Intervention:  Discussion  Additional Comments:  Patient attended and was disruptive during the goals group.  Jearl Klinefelter 01/07/2023, 11:20 AM

## 2023-01-07 NOTE — Group Note (Signed)
Date:  01/07/2023 Time:  9:22 PM  Group Topic/Focus:  Wrap-Up Group:   The focus of this group is to help patients review their daily goal of treatment and discuss progress on daily workbooks.    Participation Level:  Did Not Attend   Scot Dock 01/07/2023, 9:22 PM

## 2023-01-07 NOTE — Group Note (Signed)
Recreation Therapy Group Note   Group Topic:Communication  Group Date: 01/07/2023 Start Time: 1016 End Time: 1050 Facilitators: Curt Oatis-McCall, LRT,CTRS Location: [address] Dayroom   Goal Area(s) Addresses:  Patient will effectively listen to complete activity.  Patient will identify communication skills used to make activity successful.  Patient will identify how skills used during activity can be used to reach post d/c goals.    Group Description: Geometric Drawings.  Three volunteers from the peer group will be shown an abstract picture with a particular arrangement of geometrical shapes.  Each round, one 'speaker' will describe the pattern, as accurately as possible without revealing the image to the group.  The remaining group members will listen and draw the picture to reflect how it is described to them. Patients with the role of 'listener' cannot ask clarifying questions but, may request that the speaker repeat a direction. Once the drawings are complete, the presenter will show the rest of the group the picture and compare how close each person came to drawing the picture. LRT will facilitate a post-activity discussion regarding effective communication and the importance of planning, listening, and asking for clarification in daily interactions with others.   Affect/Mood: N/A   Participation Level: Did not attend    Clinical Observations/Individualized Feedback:     Plan: Continue to engage patient in RT group sessions 2-3x/week.   [name], LRT,CTRS  01/07/2023 1:21 PM

## 2023-01-07 NOTE — Plan of Care (Signed)
  Problem: Safety: Goal: Periods of time without injury will increase Outcome: Progressing   Problem: Health Behavior/Discharge Planning: Goal: Compliance with prescribed medication regimen will improve Outcome: Progressing   Problem: Education: Goal: Emotional status will improve Outcome: Not Progressing Goal: Mental status will improve Outcome: Not Progressing   Problem: Coping: Goal: Ability to demonstrate self-control will improve Outcome: Not Progressing   Problem: Physical Regulation: Goal: Ability to maintain clinical measurements within normal limits will improve Outcome: Not Progressing   Problem: Coping: Goal: Coping ability will improve Outcome: Not Progressing

## 2023-01-07 NOTE — Progress Notes (Signed)
[facility] MD Progress Note  01/07/2023 5:49 PM Barbara George  MRN:  [id number]  Reason For Admission: Barbara George is a 27 year old African-American female with a past psychiatric history significant for schizoaffective disorder (bipolar type) who was admitted to [facility] from [facility], ED after presenting to the ED on 7/25 by her husband and father because she stopped eating, stopped talking, and became more withdrawn the day prior.  Per patient's father, history of nonmedication compliance and patient typically after discharge, which always leads to a relapse in her symptoms.  Patient was already receiving transferred back to [facility] under IVC for treatment and stabilization of her mental status.  24 hr chart review: V/S WNL. Pt is taking meds as ordered. Remains on 1:1 staff monitoring for sexually inappropriate & intrusive behaviors. Has continued to require redirections frequently, due to hyperactive behaviors on the unit; over the past 24 hours, has required agitation protocol medications multiple times due to intrusive type behaviors and agitation.  Patient assessment note:  On assessment today, pt is in bed with eyes closed, respirations even and unlabored, one-on-one staff by the bedside.  Patient is easy to arouse, but refuses to engage in conversation with Clinical research associate. Had just gotten agitation protocol medications at the  time of assessment.  Yesterday, Haldol was changed to Thorazine 50 mg 3 times daily.  Patient had been on Haldol 10 mg 3 times daily for several days with persistent psychosis.  We will keep her medications as listed below, and reassess tomorrow.  One-on-one staff monitoring continues to be necessary at this time due to disruptive behaviors on the unit with patient requiring frequent redirections as her behaviors are disruptive to the milieu.1:1 Staff also necessary for pt's safety and the safety of other patients on the unit.  We will  continue to follow.  Principal Problem: Schizoaffective disorder, bipolar type (HCC) Diagnosis: Principal Problem:   Schizoaffective disorder, bipolar type (HCC) Active Problems:   Hyperlipidemia   Type II diabetes mellitus (HCC)   GAD (generalized anxiety disorder)  Total Time spent with patient: 35 minutes   Past Psychiatric History:  See H&P  Past Medical History:  Past Medical History:  Diagnosis Date   Bipolar 1 disorder (HCC)    Depression    Diabetes mellitus without complication (HCC)    Type 2 (Pre)   Schizophrenia (HCC)    History reviewed. No pertinent surgical history. Family History:  Family History  Problem Relation Age of Onset   Hypertension Father    Family Psychiatric  History:  See H&P  Social History:  Social History   Substance and Sexual Activity  Alcohol Use No   Alcohol/week: 0.0 standard drinks of alcohol     Social History   Substance and Sexual Activity  Drug Use No    Social History   Socioeconomic History   Marital status: Married    Spouse name: Amadou   Number of children: Not on file   Years of education: Not on file   Highest education level: Associate degree: occupational, Scientist, product/process development, or vocational program  Occupational History   Occupation: unemployed  Tobacco Use   Smoking status: Never   Smokeless tobacco: Never  Vaping Use   Vaping status: Never Used  Substance and Sexual Activity   Alcohol use: No    Alcohol/week: 0.0 standard drinks of alcohol   Drug use: No   Sexual activity: Yes    Birth control/protection: None  Other Topics Concern   Not  on file  Social History Narrative   ** Merged History Encounter **       Social Determinants of Health   Financial Resource Strain: Not on file  Food Insecurity: Patient Declined (12/28/2022)   Hunger Vital Sign    Worried About Running Out of Food in the Last Year: Patient declined    Ran Out of Food in the Last Year: Patient declined  Transportation Needs:  Patient Declined (12/28/2022)   PRAPARE - Administrator, Civil Service (Medical): Patient declined    Lack of Transportation (Non-Medical): Patient declined  Physical Activity: Inactive (04/03/2019)   Exercise Vital Sign    Days of Exercise per Week: 0 days    Minutes of Exercise per Session: 0 min  Stress: Not on file  Social Connections: Socially Integrated (04/03/2019)   Social Connection and Isolation Panel [NHANES]    Frequency of Communication with Friends and Family: More than three times a week    Frequency of Social Gatherings with Friends and Family: Once a week    Attends Religious Services: More than 4 times per year    Active Member of [employer] or Organizations: Yes    Attends Engineer, structural: Not on file    Marital Status: Married   Sleep: Unable to assess  Appetite: Unable to assess  Current Medications: Current Facility-Administered Medications  Medication Dose Route Frequency Provider Last Rate Last Admin   acetaminophen (TYLENOL) tablet 650 mg  650 mg Oral Q6H PRN Massengill, Harrold Donath, MD   650 mg at 01/06/23 1049   alum & mag hydroxide-simeth (MAALOX/MYLANTA) 200-200-20 MG/5ML suspension 30 mL  30 mL Oral Q4H PRN Massengill, Harrold Donath, MD       chlorproMAZINE (THORAZINE) tablet 50 mg  50 mg Oral TID PRN Phineas Inches, MD   50 mg at 01/07/23 1016   Or   chlorproMAZINE (THORAZINE) injection 25 mg  25 mg Intramuscular TID PRN Massengill, Harrold Donath, MD       chlorproMAZINE (THORAZINE) tablet 50 mg  50 mg Oral Q8H Massengill, Nathan, MD   50 mg at 01/07/23 1611   chlorproMAZINE (THORAZINE) tablet 50 mg  50 mg Oral QHS PRN Massengill, Harrold Donath, MD   50 mg at 01/06/23 2351   cloNIDine (CATAPRES) tablet 0.1 mg  0.1 mg Oral Q6H PRN Abbott Pao, Nadir, MD       diphenhydrAMINE (BENADRYL) capsule 50 mg  50 mg Oral TID PRN Massengill, Harrold Donath, MD   50 mg at 01/07/23 1014   Or   diphenhydrAMINE (BENADRYL) injection 50 mg  50 mg Intramuscular TID PRN Massengill,  Harrold Donath, MD   50 mg at 01/03/23 1049   hydrOXYzine (ATARAX) tablet 50 mg  50 mg Oral TID PRN Phineas Inches, MD   50 mg at 01/06/23 2351   LORazepam (ATIVAN) tablet 2 mg  2 mg Oral TID PRN Phineas Inches, MD   2 mg at 01/07/23 1014   Or   LORazepam (ATIVAN) injection 2 mg  2 mg Intramuscular TID PRN Massengill, Harrold Donath, MD   2 mg at 01/03/23 1049   magnesium hydroxide (MILK OF MAGNESIA) suspension 30 mL  30 mL Oral Daily PRN Massengill, Harrold Donath, MD       nicotine polacrilex (NICORETTE) gum 2 mg  2 mg Oral PRN Starleen Blue, NP   2 mg at 01/07/23 1017   propranolol (INDERAL) tablet 10 mg  10 mg Oral BID Starleen Blue, NP   10 mg at 01/07/23 0806   simvastatin (ZOCOR) tablet 10  mg  10 mg Oral q1800 Starleen Blue, NP   10 mg at 01/07/23 1611   Vitamin D (Ergocalciferol) (DRISDOL) 1.25 MG (50000 UNIT) capsule 50,000 Units  50,000 Units Oral Q7 days Starleen Blue, NP   50,000 Units at 01/01/23 0945    Lab Results:  No results found for this or any previous visit (from the past 48 hour(s)).    Blood Alcohol level:  Lab Results  Component Value Date   ETH <10 12/27/2022   ETH <10 12/11/2022    Metabolic Disorder Labs: Lab Results  Component Value Date   HGBA1C 6.2 (H) 12/11/2022   MPG 131 12/11/2022   MPG 111.15 10/23/2018   Lab Results  Component Value Date   PROLACTIN 34.3 (H) 12/30/2022   PROLACTIN 13.0 12/11/2022   Lab Results  Component Value Date   CHOL 137 01/05/2023   TRIG 62 01/05/2023   HDL 62 01/05/2023   CHOLHDL 2.2 01/05/2023   VLDL 12 01/05/2023   LDLCALC 63 01/05/2023   LDLCALC 113 (H) 12/11/2022   Physical Findings: AIMS: 0Overall Severity Severity of abnormal movements (highest score from questions above): None, normal Incapacitation due to abnormal movements: None, normal Patient's awareness of abnormal movements (rate only patient's report): No Awareness, Dental Status Current problems with teeth and/or dentures?: No Does patient usually wear  dentures?: No  CIWA:    COWS:     Musculoskeletal: Strength & Muscle Tone: within normal limits Gait & Station: normal Patient leans: N/A  Psychiatric Specialty Exam:  Presentation  General Appearance:  Disheveled  Eye Contact: Fair  Speech: -- (UTA)  Speech Volume: Normal  Handedness: Right   Mood and Affect  Mood: Good  Affect: Labile, inappropriate  Thought Process  Thought Processes: Disorganized (UTA)  Descriptions of Associations:Tangential (UTA)  Orientation:Partial  Thought Content:Illogical (UTA)  History of Schizophrenia/Schizoaffective disorder:Yes  Duration of Psychotic Symptoms:Greater than six months  Hallucinations:Couldn't assess  Ideas of Reference:None  Suicidal Thoughts:Suicidal Thoughts: No   Homicidal Thoughts:Homicidal Thoughts: No   Sensorium  Memory: Couldn't assess  Judgment: Poor  Insight: Poor   Executive Functions  Concentration: Poor  Attention Span: Poor  Recall: Poor  Fund of Knowledge: Poor  Language: Poor   Psychomotor Activity  Psychomotor Activity: Psychomotor Activity: Normal   Assets  Assets: Resilience; Social Support   Sleep  Sleep: Sleep: Good   Physical Exam: Constitutional:      Appearance: Normal appearance.  HENT:     Head: Normocephalic and atraumatic.  Eyes:     Extraocular Movements: Extraocular movements intact.  Cardiovascular:     Rate and Rhythm: Normal rate and regular rhythm.  Pulmonary:     Effort: Pulmonary effort is normal.   Musculoskeletal:        General: Normal range of motion.     Cervical back: Normal range of motion.  Skin:    General: Skin is warm and dry.   Review of Systems  Constitutional: Negative.   HENT: Negative.    Eyes: Negative.   Respiratory: Negative.    Cardiovascular: Negative.   Gastrointestinal: Negative.   Skin: Negative.   Neurological: Negative.  Blood pressure 119/60, pulse 77, temperature 98 F (36.7 C),  temperature source Oral, resp. rate 16, height 5\' 8"  (1.727 m), weight 101.2 kg, SpO2 100%. Body mass index is 33.91 kg/m.   Treatment Plan Summary: Daily contact with patient to assess and evaluate symptoms and progress in treatment and Medication management  Plan:  Patient exhibited thought blocking and  was mostly mute during most of the assessment.  When patient was able to communicate, patient provided bizarre responses to the questions addressed to her.  Patient to remain on her current psychiatric medication regimen.  Patient to continue to be evaluated and assessed to determine efficacy of treatment.  Safety and Monitoring: Voluntary admission to inpatient psychiatric unit for safety, stabilization and treatment Daily contact with patient to assess and evaluate symptoms and progress in treatment Patient's case to be discussed in multi-disciplinary team meeting Observation Level : q15 minute checks Vital signs: q12 hours Precautions: Safety  Diagnosis: Principal Problem:   Schizoaffective disorder, bipolar type (HCC) Active Problems:   Hyperlipidemia   Type II diabetes mellitus (HCC)   GAD (generalized anxiety disorder)  #Schizoaffective disorder (bipolar type) -Continue Thorazine 50 mg TID for psychosis/mood stabilization -Discontinued Haldol 10 mg TID on 8/4 -Previously discontinued risperidone 2 mg  BID as psychosis was persistent -Continue Cogentin to 1 mg BID for EPS prophylaxis   Agitation protocol: -Diphenhydramine 50 mg p.o. or injection, 3 times daily as needed for agitation -Continue Thorazine 50 mg TID PRN for agitation - Previously Discontinued Haloperidol 5 mg p.o. or injection, 3 times daily as needed for agitation -Lorazepam 2 mg p.o. or injection, 3 times daily as needed for agitation  #Generalized anxiety disorder -Continue hydroxyzine 50 mg 3 times daily as needed for anxiety -Continue propranolol 10 mg 2 times daily for  anxiety  #Hyperlipidemia -Continue simvastatin 10 mg daily  Insomnia -Continue trazodone 150 mg at bedtime for the management of insomnia  Hypertension -Continue clonidine 0.1 mg every 6 hours as needed for systolic blood pressure greater than 160 or diastolic blood pressure less greater than 100  Low Vitamin D level -Continue Vitamin D 50.000 units weekly  As needed medications: -Patient to continue taking Tylenol 650 mg every 6 hours as needed for mild pain -Patient to continue taking Maalox/Mylanta 30 mL every 4 hours as needed for indigestion -Patient to continue taking Milk of Magnesia 30 mL as needed for mild constipation  Labs reviewed: Ammonia level was found to be within normal limits (<10 umol/L).  Vitamin D level was found to be decreased (20.09 ng/mL), supple.menting.  Prolactin level slightly elevated at 34. [name]  Discharge Planning: Social work and case management to assist with discharge planning and identification of hospital follow-up needs prior to discharge Estimated LOS: 5-7 days Discharge Concerns: Need to establish a safety plan; Medication compliance and effectiveness Discharge Goals: Return home with outpatient referrals for mental health follow-up including medication management/psychotherapy   I certify that inpatient services furnished can reasonably be expected to improve the patient's condition.    Starleen Blue, NP  Patient ID: Barbara George, female   DOB: 1996/02/17, 27 y.o.   MRN: [id number]  Patient ID: Barbara George, female   DOB: [birth year], 27 y.o.   MRN: [id number]

## 2023-01-07 NOTE — Progress Notes (Addendum)
Pt received PRN Thorazine 50 mg and Vistaril 50 mg both PO at 2351 as pt was up, pacing between bathroom and room; hyperactive at this time after scheduled bed time medications. Attempting to be loud when redirected several times. 1:1 observation maintained with assigned staff in attendance at all times.

## 2023-01-07 NOTE — Progress Notes (Signed)
Pt husband stated that pt told him that she did not like the food here and wanted him to bring food in here she could eat, writer explained that the pt needs to notify the doctor and the doctor has to have an order for that to happen. Writer explained to him that pt has had multiple admissions and this is the fist we are hearing of this, this may be more related to another pt on the unit getting outside food.    01/07/23 2145  Psych Admission Type (Psych Patients Only)  Admission Status Involuntary  Psychosocial Assessment  Patient Complaints Anxiety;Restlessness  Eye Contact Staring  Facial Expression Anxious;Animated  Affect Labile  Orthoptist Attention-seeking;Childlike;Sarcastic  Motor Activity Restless;Fidgety  Appearance/Hygiene Layered clothes  Behavior Characteristics Hypersexual;Hyperactive;Intrusive  Mood Labile;Silly;Preoccupied  Aggressive Behavior  Effect No apparent injury  Thought Process  Coherency Circumstantial;Disorganized  Content Preoccupation  Delusions WDL  Perception WDL  Hallucination None reported or observed  Judgment Poor  Confusion WDL  Danger to Self  Current suicidal ideation? Denies  Danger to Others  Danger to Others None reported or observed

## 2023-01-07 NOTE — Progress Notes (Signed)
1:1 Note:  Patient in her room sleeping. No distress noted. 1:1 monitoring continues. Patient remains safe at this time.

## 2023-01-07 NOTE — Group Note (Signed)
LCSW Group Therapy Note   Group Date: 01/07/2023 Start Time: 1300 End Time: 1345  Type of Therapy and Topic:  Group Therapy: Goal Planning  Participation Level:  Did Not Attend  Description of Group: In this process group, patients discussed using strengths to work toward goals and address challenges.  Patients identified two positive things about themselves and one goal they were working on.  Patients were given the opportunity to share openly and support each other's plan for self-empowerment.  The group discussed the value of gratitude and were encouraged to have a daily reflection of positive characteristics or circumstances.  Patients were encouraged to identify a plan to utilize their strengths to work on current challenges and goals.  Therapeutic Goals Patient will verbalize personal strengths/positive qualities and relate how these can assist with achieving desired personal goals Patients will verbalize affirmation of peers plans for personal change and goal setting Patients will explore the value of gratitude and positive focus as related to successful achievement of goals Patients will verbalize a plan for regular reinforcement of personal positive qualities and circumstances.  Summary of Patient Progress:  Did not attend.    Therapeutic Modalities Cognitive Behavioral Therapy Motivational Interviewing  Izell [place], Theresia Majors 01/07/2023  1:58 PM

## 2023-01-07 NOTE — Plan of Care (Signed)
?  Problem: Education: Goal: Emotional status will improve Outcome: Not Progressing Goal: Mental status will improve Outcome: Not Progressing   Problem: Coping: Goal: Ability to demonstrate self-control will improve Outcome: Not Progressing   

## 2023-01-07 NOTE — Progress Notes (Signed)
Nursing 1:1 note D:Pt observed sleeping in bed with eyes closed. RR even and unlabored. No distress noted. A: 1:1 observation continues for safety  R: pt remains safe  

## 2023-01-07 NOTE — Plan of Care (Addendum)
Problem: Coping: Goal: Ability to verbalize frustrations and anger appropriately will improve Outcome: Not Progressing   Problem: Coping: Goal: Coping ability will improve Outcome: Not Progressing   Pt asleep at this time, respirations noted and unlabored. Continues to need multiple verbal redirections when awake due to hyperactive, hypersexual, impulsive and intrusive behavior. 1:1 observation maintained with assigned staff at bedside at all times.

## 2023-01-08 DIAGNOSIS — F250 Schizoaffective disorder, bipolar type: Secondary | ICD-10-CM | POA: Diagnosis not present

## 2023-01-08 NOTE — [facility] Group Notes (Signed)
Adult Psychoeducational Group Note  Date:  01/08/2023 Time:  8:14 PM  Group Topic/Focus:  Goals Group:   The focus of this group is to help patients establish daily goals to achieve during treatment and discuss how the patient can incorporate goal setting into their daily lives to aide in recovery. Orientation:   The focus of this group is to educate the patient on the purpose and policies of crisis stabilization and provide a format to answer questions about their admission.  The group details unit policies and expectations of patients while admitted.  Participation Level:  Did Not Attend  Participation Quality:    Affect:    Cognitive:    Insight:   Engagement in Group:    Modes of Intervention:    Additional Comments:    Sheran Lawless 01/08/2023, 8:14 PM

## 2023-01-08 NOTE — Group Note (Signed)
Recreation Therapy Group Note   Group Topic:Health and Wellness  Group Date: 01/08/2023 Start Time: 1040 End Time: 1120 Facilitators: Laryssa Hassing-McCall, LRT,CTRS Location: [address] Dayroom   Goal Area(s) Addresses:  Patient will verbalize benefit of exercise during group session. Patient will identify an exercise that can be completed post d/c. Patient will acknowledge benefits of exercise when used as a coping mechanism.   Group Description: Exercise. LRT and patients discussed the importance of good physical health. LRT and patients worked out between 20-30 minutes. Patients took turns leading the group in the exercises of their choosing. Patients were encouraged to take breaks or get water as needed.   Affect/Mood: Flat   Participation Level: Minimal   Participation Quality: Maximum Cues   Behavior: Disruptive and Attention Seeking   Speech/Thought Process: Coherent   Insight: Poor   Judgement: Poor   Modes of Intervention: Music   Patient Response to Interventions:  Challenging    Education Outcome:  In group clarification offered    Clinical Observations/Individualized Feedback: Pt was disruptive but would stop behaviors for a few seconds when redirected. Pt was trying to invade personal space of some peers. Pt was very attention seeking and had a smirk on her face when acting out disruptive behaviors.     Plan: Continue to engage patient in RT group sessions 2-3x/week.   Dilyn Osoria-McCall, LRT,CTRS  01/08/2023 12:03 PM

## 2023-01-08 NOTE — Group Note (Signed)
Date:  01/08/2023 Time:  10:18 PM  Group Topic/Focus:  Wrap-Up Group:   The focus of this group is to help patients review their daily goal of treatment and discuss progress on daily workbooks.    Participation Level:  None  Participation Quality:  Resistant  Affect:  Blunted  Cognitive:  Lacking  Insight: None  Engagement in Group:  Resistant  Modes of Intervention:  Support  Additional Comments:  The patient had little to share with the group except that she rated her day as a 0 out of 10 because she ate eggs. She refused to share anything else with the group.   Hazle Coca S 01/08/2023, 10:18 PM

## 2023-01-08 NOTE — Progress Notes (Signed)
Pt coming out her room multiple times needing frequent re-direction . Pt requesting snacks multiple times

## 2023-01-08 NOTE — Progress Notes (Signed)
Patient took a Environmental education officer and threw it across the dayroom. She then proceeded to lie on the floor, requiring redirection. Patient asked to return to her room. 1:1 monitoring continues. Patient remains safe at this time.

## 2023-01-08 NOTE — Progress Notes (Signed)
   01/08/23 2130  Psych Admission Type (Psych Patients Only)  Admission Status Involuntary  Psychosocial Assessment  Patient Complaints Restlessness  Eye Contact Staring  Facial Expression Anxious;Animated  Affect Labile  Orthoptist Attention-seeking;Childlike;Sarcastic  Motor Activity Restless;Fidgety  Appearance/Hygiene Layered clothes  Behavior Characteristics Hypersexual;Hyperactive  Mood Labile;Suspicious;Preoccupied  Aggressive Behavior  Effect No apparent injury  Thought Process  Coherency Circumstantial;Disorganized  Content Preoccupation  Delusions WDL  Perception WDL  Hallucination None reported or observed  Judgment Poor  Confusion WDL  Danger to Self  Current suicidal ideation? Denies  Danger to Others  Danger to Others None reported or observed

## 2023-01-08 NOTE — Progress Notes (Signed)
   01/08/23 0809  Psych Admission Type (Psych Patients Only)  Admission Status Involuntary  Psychosocial Assessment  Patient Complaints Restlessness  Eye Contact Intense;Staring  Facial Expression Animated  Affect Labile  Speech Argumentative;Tangential  Interaction Attention-seeking;Childlike;Intrusive;Sexually inappropriate  Motor Activity Restless;Fidgety  Appearance/Hygiene Layered clothes  Behavior Characteristics Hyperactive;Hypersexual;Restless;Intrusive  Mood Labile;Preoccupied  Thought Process  Coherency Disorganized  Content Preoccupation  Delusions None reported or observed  Perception WDL  Hallucination None reported or observed  Judgment Poor  Confusion None  Danger to Self  Current suicidal ideation? Denies  Self-Injurious Behavior No self-injurious ideation or behavior indicators observed or expressed   Agreement Not to Harm Self Yes  Description of Agreement Verbal  Danger to Others  Danger to Others None reported or observed

## 2023-01-08 NOTE — Progress Notes (Signed)
Nursing 1:1 note D:Pt observed sleeping in bed with eyes closed. RR even and unlabored. No distress noted. A: 1:1 observation continues for safety  R: pt remains safe  

## 2023-01-08 NOTE — Progress Notes (Signed)
1:1 note: Patient in the dayroom watching TV. No distress noted. 1:1 monitoring continues. Patient remains safe at this time.

## 2023-01-08 NOTE — Progress Notes (Signed)
1:1 Note: Patent is seen in the hall with staff at this time. Patient took her medicine at 74, and tolerated medication well with no side effect noted. No distress noted at this time. Staff will continue to provide support to patient.

## 2023-01-08 NOTE — Plan of Care (Signed)
  Problem: Education: Goal: Emotional status will improve Outcome: Progressing   Problem: Activity: Goal: Interest or engagement in activities will improve Outcome: Progressing Goal: Sleeping patterns will improve Outcome: Progressing   Problem: Coping: Goal: Ability to demonstrate self-control will improve Outcome: Progressing   Problem: Education: Goal: Mental status will improve Outcome: Not Progressing Goal: Verbalization of understanding the information provided will improve Outcome: Not Progressing

## 2023-01-08 NOTE — Plan of Care (Signed)
  Problem: Education: Goal: Emotional status will improve Outcome: Not Progressing Goal: Mental status will improve Outcome: Not Progressing Goal: Verbalization of understanding the information provided will improve Outcome: Not Progressing   Problem: Coping: Goal: Ability to demonstrate self-control will improve Outcome: Not Progressing

## 2023-01-09 ENCOUNTER — Encounter (HOSPITAL_COMMUNITY): Payer: Self-pay

## 2023-01-09 DIAGNOSIS — F250 Schizoaffective disorder, bipolar type: Secondary | ICD-10-CM | POA: Diagnosis not present

## 2023-01-09 MED ORDER — BENZTROPINE MESYLATE 1 MG PO TABS: 1.0000 mg | ORAL_TABLET | Freq: Two times a day (BID) | ORAL | Status: DC | PRN

## 2023-01-09 MED ORDER — BENZTROPINE MESYLATE 1 MG/ML IJ SOLN: 1.0000 mg | Freq: Two times a day (BID) | INTRAMUSCULAR | Status: DC | PRN

## 2023-01-09 MED ORDER — CHLORPROMAZINE HCL 25 MG/ML IJ SOLN
50.0000 mg | Freq: Three times a day (TID) | INTRAMUSCULAR | Status: DC
  Administered 2023-01-09 – 2023-01-10 (×3): 50 mg via INTRAMUSCULAR
  Filled 2023-01-09 (×9): qty 2

## 2023-01-09 NOTE — Progress Notes (Signed)
Patient was in the dayroom and threw snacks and drink onto the floor. Patient then started agitating other patients by being intrusive and taking papers, wetting them, and putting them on the wall. Peers became upset and left the dayroom. Patient was asked to return to her room, but turned away and refused. Patient offered PO medications 3 times and refused. Patient then administered IM agitation protocol, per MAR, without incident. Patient is now in her room under continued 1:1 observation. Patient remains safe at this time.

## 2023-01-09 NOTE — Group Note (Signed)
Recreation Therapy Group Note   Group Topic:Team Building  Group Date: 01/09/2023 Start Time: 1035 End Time: 1100 Facilitators: Tameem Pullara-McCall, LRT,CTRS Location: [address] Dayroom   Goal Area(s) Addresses:  Patient will effectively work with peer towards shared goal.  Patient will identify skills used to make activity successful.  Patient will identify how skills used during activity can be applied to reach post d/c goals.   Group Description: [employer]. In teams of 5-6, patients were given 11 craft pipe cleaners. Using the materials provided, patients were instructed to compete again the opposing team(s) to build the tallest free-standing structure from floor level. The activity was timed; difficulty increased by Clinical research associate as Production designer, theatre/television/film continued.  Systematically resources were removed with additional directions for example, placing one arm behind their back, working in silence, and shape stipulations. LRT facilitated post-activity discussion reviewing team processes and necessary communication skills involved in completion. Patients were encouraged to reflect how the skills utilized, or not utilized, in this activity can be incorporated to positively impact support systems post discharge.   Affect/Mood: Flat   Participation Level: None   Participation Quality: None   Behavior: Passive   Speech/Thought Process: None   Insight: None   Judgement: None   Modes of Intervention: Team-building   Patient Response to Interventions:  Disengaged   Education Outcome:  In group clarification offered    Clinical Observations/Individualized Feedback: Pt did not participate in group session. Pt was in and out of group. While in group, pt was able to control herself and wasn't a disruption to peers.    Plan: Continue to engage patient in RT group sessions 2-3x/week.   Desmund Elman-McCall, LRT,CTRS  01/09/2023 12:15 PM

## 2023-01-09 NOTE — Plan of Care (Signed)
  Problem: Education: Goal: Emotional status will improve Outcome: Not Progressing Goal: Mental status will improve Outcome: Not Progressing   Problem: Coping: Goal: Ability to verbalize frustrations and anger appropriately will improve Outcome: Not Progressing   

## 2023-01-09 NOTE — Progress Notes (Signed)
Nursing 1:1 note D:Pt observed sitting bed. RR even and unlabored. No distress noted. A: 1:1 observation continues for safety  R: pt remains safe

## 2023-01-09 NOTE — Progress Notes (Signed)
[facility] MD Progress Note  01/09/2023 8:53 AM Saja Midou Hamadou  MRN:  [id number]  Reason For Admission: Tamatha Midou Hamadou is a 27 year old African-American female with a past psychiatric history significant for schizoaffective disorder (bipolar type) who was admitted to [facility] from [facility], ED after presenting to the ED on 7/25 by her husband and father because she stopped eating, stopped talking, and became more withdrawn the day prior.  Per patient's father, history of nonmedication compliance and patient typically after discharge, which always leads to a relapse in her symptoms.  Patient was already receiving transferred back to [facility] under IVC for treatment and stabilization of her mental status.  Patient assessment note:  On assessment today, patient remains on 1:1 staff monitoring, remains with manic type behaviors and psychosis remains persistent. Pt is tangential during encounter and presents with flight of ideas, is grandiose; presents with delusional thoughts, states that she is the moon, and is untouchable by any one. She is non linear in responses to questions, requiring multiple verbal redirections by staff due to being intrusive. Mood today is labile and dysphoric.  Pt verbalized to writer during encounter that "I do not swallow any of the medicine that y'all give me." This is most likely the cause of her persistent psychosis. She denies SI/HI/AH. She talks about visual hallucinations of angels. She denies being in physical pain today, reports a good sleep quality last night, reports a good appetite. 1:1 staff monitoring remains in place and is required for safety as pt is intrusive, requiring multiple verbal redirections, and is sexually inappropriate at times. Continuing medications as listed below, and will revisit med adjustments tomorrow. No TD/EPS type symptoms found on assessment, and pt denies any feelings of stiffness. AIMS: 0.   Principal  Problem: Schizoaffective disorder, bipolar type (HCC) Diagnosis: Principal Problem:   Schizoaffective disorder, bipolar type (HCC) Active Problems:   Hyperlipidemia   Type II diabetes mellitus (HCC)   GAD (generalized anxiety disorder)  Total Time spent with patient: 35 minutes   Past Psychiatric History:  See H&P  Past Medical History:  Past Medical History:  Diagnosis Date   Bipolar 1 disorder (HCC)    Depression    Diabetes mellitus without complication (HCC)    Type 2 (Pre)   Schizophrenia (HCC)    History reviewed. No pertinent surgical history. Family History:  Family History  Problem Relation Age of Onset   Hypertension Father    Family Psychiatric  History:  See H&P  Social History:  Social History   Substance and Sexual Activity  Alcohol Use No   Alcohol/week: 0.0 standard drinks of alcohol     Social History   Substance and Sexual Activity  Drug Use No    Social History   Socioeconomic History   Marital status: Married    Spouse name: Amadou   Number of children: Not on file   Years of education: Not on file   Highest education level: Associate degree: occupational, Scientist, product/process development, or vocational program  Occupational History   Occupation: unemployed  Tobacco Use   Smoking status: Never   Smokeless tobacco: Never  Vaping Use   Vaping status: Never Used  Substance and Sexual Activity   Alcohol use: No    Alcohol/week: 0.0 standard drinks of alcohol   Drug use: No   Sexual activity: Yes    Birth control/protection: None  Other Topics Concern   Not on file  Social History Narrative   ** Merged History Encounter **  Social Determinants of Health   Financial Resource Strain: Not on file  Food Insecurity: Patient Declined (12/28/2022)   Hunger Vital Sign    Worried About Running Out of Food in the Last Year: Patient declined    Ran Out of Food in the Last Year: Patient declined  Transportation Needs: Patient Declined (12/28/2022)   PRAPARE  - Administrator, Civil Service (Medical): Patient declined    Lack of Transportation (Non-Medical): Patient declined  Physical Activity: Inactive (04/03/2019)   Exercise Vital Sign    Days of Exercise per Week: 0 days    Minutes of Exercise per Session: 0 min  Stress: Not on file  Social Connections: Socially Integrated (04/03/2019)   Social Connection and Isolation Panel [NHANES]    Frequency of Communication with Friends and Family: More than three times a week    Frequency of Social Gatherings with Friends and Family: Once a week    Attends Religious Services: More than 4 times per year    Active Member of [employer] or Organizations: Yes    Attends Engineer, structural: Not on file    Marital Status: Married   Sleep: Unable to assess  Appetite: Unable to assess  Current Medications: Current Facility-Administered Medications  Medication Dose Route Frequency Provider Last Rate Last Admin   acetaminophen (TYLENOL) tablet 650 mg  650 mg Oral Q6H PRN Massengill, Harrold Donath, MD   650 mg at 01/06/23 1049   alum & mag hydroxide-simeth (MAALOX/MYLANTA) 200-200-20 MG/5ML suspension 30 mL  30 mL Oral Q4H PRN Massengill, Harrold Donath, MD       chlorproMAZINE (THORAZINE) tablet 50 mg  50 mg Oral TID PRN Phineas Inches, MD   50 mg at 01/09/23 0210   Or   chlorproMAZINE (THORAZINE) injection 25 mg  25 mg Intramuscular TID PRN Massengill, Harrold Donath, MD       chlorproMAZINE (THORAZINE) tablet 50 mg  50 mg Oral Q8H Massengill, Nathan, MD   50 mg at 01/09/23 7425   chlorproMAZINE (THORAZINE) tablet 50 mg  50 mg Oral QHS PRN Massengill, Harrold Donath, MD   50 mg at 01/08/23 0021   cloNIDine (CATAPRES) tablet 0.1 mg  0.1 mg Oral Q6H PRN Abbott Pao, Nadir, MD       diphenhydrAMINE (BENADRYL) capsule 50 mg  50 mg Oral TID PRN Phineas Inches, MD   50 mg at 01/07/23 1014   Or   diphenhydrAMINE (BENADRYL) injection 50 mg  50 mg Intramuscular TID PRN Massengill, Harrold Donath, MD   50 mg at 01/09/23 0308    hydrOXYzine (ATARAX) tablet 50 mg  50 mg Oral TID PRN Phineas Inches, MD   50 mg at 01/08/23 2040   LORazepam (ATIVAN) tablet 2 mg  2 mg Oral TID PRN Phineas Inches, MD   2 mg at 01/07/23 1014   Or   LORazepam (ATIVAN) injection 2 mg  2 mg Intramuscular TID PRN Massengill, Harrold Donath, MD   2 mg at 01/09/23 0308   magnesium hydroxide (MILK OF MAGNESIA) suspension 30 mL  30 mL Oral Daily PRN Massengill, Harrold Donath, MD       nicotine polacrilex (NICORETTE) gum 2 mg  2 mg Oral PRN Starleen Blue, NP   2 mg at 01/08/23 1501   propranolol (INDERAL) tablet 10 mg  10 mg Oral BID Starleen Blue, NP   10 mg at 01/09/23 0823   simvastatin (ZOCOR) tablet 10 mg  10 mg Oral q1800 Starleen Blue, NP   10 mg at 01/08/23 1832   Vitamin  D (Ergocalciferol) (DRISDOL) 1.25 MG (50000 UNIT) capsule 50,000 Units  50,000 Units Oral Q7 days Starleen Blue, NP   50,000 Units at 01/08/23 1016    Lab Results:  No results found for this or any previous visit (from the past 48 hour(s)).    Blood Alcohol level:  Lab Results  Component Value Date   ETH <10 12/27/2022   ETH <10 12/11/2022    Metabolic Disorder Labs: Lab Results  Component Value Date   HGBA1C 6.2 (H) 12/11/2022   MPG 131 12/11/2022   MPG 111.15 10/23/2018   Lab Results  Component Value Date   PROLACTIN 34.3 (H) 12/30/2022   PROLACTIN 13.0 12/11/2022   Lab Results  Component Value Date   CHOL 137 01/05/2023   TRIG 62 01/05/2023   HDL 62 01/05/2023   CHOLHDL 2.2 01/05/2023   VLDL 12 01/05/2023   LDLCALC 63 01/05/2023   LDLCALC 113 (H) 12/11/2022   Physical Findings: AIMS: 0Overall Severity Severity of abnormal movements (highest score from questions above): None, normal Incapacitation due to abnormal movements: None, normal Patient's awareness of abnormal movements (rate only patient's report): No Awareness, Dental Status Current problems with teeth and/or dentures?: No Does patient usually wear dentures?: No  CIWA:    COWS:      Musculoskeletal: Strength & Muscle Tone: within normal limits Gait & Station: normal Patient leans: N/A  Psychiatric Specialty Exam:  Presentation  General Appearance:  Bizarre  Eye Contact: Fair  Speech: Normal Rate  Speech Volume: Increased  Handedness: Right   Mood and Affect  Mood: Good  Affect: Labile, inappropriate  Thought Process  Thought Processes: Coherent  Descriptions of Associations:Tangential  Orientation:Partial  Thought Content:Illogical  History of Schizophrenia/Schizoaffective disorder:Yes  Duration of Psychotic Symptoms:Greater than six months  Hallucinations:Couldn't assess  Ideas of Reference:Delusions  Suicidal Thoughts:Suicidal Thoughts: No    Homicidal Thoughts:Homicidal Thoughts: No    Sensorium  Memory: Couldn't assess  Judgment: Poor  Insight: Poor   Executive Functions  Concentration: Poor  Attention Span: Poor  Recall: Poor  Fund of Knowledge: Poor  Language: Poor   Psychomotor Activity  Psychomotor Activity: Psychomotor Activity: Normal    Assets  Assets: Social Support; Resilience   Sleep  Sleep: Sleep: Poor    Physical Exam: Constitutional:      Appearance: Normal appearance.  HENT:     Head: Normocephalic and atraumatic.  Eyes:     Extraocular Movements: Extraocular movements intact.  Cardiovascular:     Rate and Rhythm: Normal rate and regular rhythm.  Pulmonary:     Effort: Pulmonary effort is normal.   Musculoskeletal:        General: Normal range of motion.     Cervical back: Normal range of motion.  Skin:    General: Skin is warm and dry.   Review of Systems  Constitutional: Negative.   HENT: Negative.    Eyes: Negative.   Respiratory: Negative.    Cardiovascular: Negative.   Gastrointestinal: Negative.   Skin: Negative.   Neurological: Negative.  Blood pressure 130/82, pulse 93, temperature 97.9 F (36.6 C), temperature source Oral, resp. rate  16, height 5\' 8"  (1.727 m), weight 101.2 kg, SpO2 100%. Body mass index is 33.91 kg/m.   Treatment Plan Summary: Daily contact with patient to assess and evaluate symptoms and progress in treatment and Medication management  Plan:  Patient exhibited thought blocking and was mostly mute during most of the assessment.  When patient was able to communicate, patient provided bizarre  responses to the questions addressed to her.  Patient to remain on her current psychiatric medication regimen.  Patient to continue to be evaluated and assessed to determine efficacy of treatment.  Safety and Monitoring: Voluntary admission to inpatient psychiatric unit for safety, stabilization and treatment Daily contact with patient to assess and evaluate symptoms and progress in treatment Patient's case to be discussed in multi-disciplinary team meeting Observation Level : q15 minute checks Vital signs: q12 hours Precautions: Safety  Diagnosis: Principal Problem:   Schizoaffective disorder, bipolar type (HCC) Active Problems:   Hyperlipidemia   Type II diabetes mellitus (HCC)   GAD (generalized anxiety disorder)  #Schizoaffective disorder (bipolar type) -Continue Thorazine 50 mg TID for psychosis/mood stabilization -Discontinued Haldol 10 mg TID on 8/4 -Previously discontinued risperidone 2 mg  BID as psychosis was persistent -Continue Cogentin to 1 mg BID for EPS prophylaxis   Agitation protocol: -Diphenhydramine 50 mg p.o. or injection, 3 times daily as needed for agitation -Continue Thorazine 50 mg TID PRN for agitation - Previously Discontinued Haloperidol 5 mg p.o. or injection, 3 times daily as needed for agitation -Lorazepam 2 mg p.o. or injection, 3 times daily as needed for agitation  #Generalized anxiety disorder -Continue hydroxyzine 50 mg 3 times daily as needed for anxiety -Continue propranolol 10 mg 2 times daily for anxiety  #Hyperlipidemia -Continue simvastatin 10 mg  daily  Insomnia -Continue trazodone 150 mg at bedtime for the management of insomnia  Hypertension -Continue clonidine 0.1 mg every 6 hours as needed for systolic blood pressure greater than 160 or diastolic blood pressure less greater than 100  Low Vitamin D level -Continue Vitamin D 50.000 units weekly  As needed medications: -Patient to continue taking Tylenol 650 mg every 6 hours as needed for mild pain -Patient to continue taking Maalox/Mylanta 30 mL every 4 hours as needed for indigestion -Patient to continue taking Milk of Magnesia 30 mL as needed for mild constipation  Labs reviewed: Ammonia level was found to be within normal limits (<10 umol/L).  Vitamin D level was found to be decreased (20.09 ng/mL), supple.menting.  Prolactin level slightly elevated at 34. [name]  Discharge Planning: Social work and case management to assist with discharge planning and identification of hospital follow-up needs prior to discharge Estimated LOS: 5-7 days Discharge Concerns: Need to establish a safety plan; Medication compliance and effectiveness Discharge Goals: Return home with outpatient referrals for mental health follow-up including medication management/psychotherapy   I certify that inpatient services furnished can reasonably be expected to improve the patient's condition.    Starleen Blue, NP  Patient ID: Kaylenn Midou Kandis Nab, female   DOB: 1995-07-08, 27 y.o.   MRN: [id number]

## 2023-01-09 NOTE — Progress Notes (Signed)
Nursing 1:1 note D:Pt observed sitting in bed with eyes open. RR even and unlabored. No distress noted. A: 1:1 observation continues for safety  R: pt remains safe  

## 2023-01-09 NOTE — BH IP Treatment Plan (Signed)
Interdisciplinary Treatment and Diagnostic Plan Update  01/09/2023 Time of Session: 9:55am (UPDATE) Barbara George MRN: [id number]  Principal Diagnosis: Schizoaffective disorder, bipolar type (HCC)  Secondary Diagnoses: Principal Problem:   Schizoaffective disorder, bipolar type (HCC) Active Problems:   Hyperlipidemia   Type II diabetes mellitus (HCC)   GAD (generalized anxiety disorder)   Current Medications:  Current Facility-Administered Medications  Medication Dose Route Frequency Provider Last Rate Last Admin   acetaminophen (TYLENOL) tablet 650 mg  650 mg Oral Q6H PRN Massengill, Harrold Donath, MD   650 mg at 01/06/23 1049   alum & mag hydroxide-simeth (MAALOX/MYLANTA) 200-200-20 MG/5ML suspension 30 mL  30 mL Oral Q4H PRN Massengill, Harrold Donath, MD       chlorproMAZINE (THORAZINE) tablet 50 mg  50 mg Oral TID PRN Phineas Inches, MD   50 mg at 01/09/23 0210   Or   chlorproMAZINE (THORAZINE) injection 25 mg  25 mg Intramuscular TID PRN Phineas Inches, MD   25 mg at 01/09/23 1331   chlorproMAZINE (THORAZINE) injection 50 mg  50 mg Intramuscular TID Starleen Blue, NP       chlorproMAZINE (THORAZINE) tablet 50 mg  50 mg Oral QHS PRN Massengill, Harrold Donath, MD   50 mg at 01/08/23 0021   cloNIDine (CATAPRES) tablet 0.1 mg  0.1 mg Oral Q6H PRN Abbott Pao, Nadir, MD       diphenhydrAMINE (BENADRYL) capsule 50 mg  50 mg Oral TID PRN Phineas Inches, MD   50 mg at 01/07/23 1014   Or   diphenhydrAMINE (BENADRYL) injection 50 mg  50 mg Intramuscular TID PRN Massengill, Harrold Donath, MD   50 mg at 01/09/23 1341   hydrOXYzine (ATARAX) tablet 50 mg  50 mg Oral TID PRN Phineas Inches, MD   50 mg at 01/08/23 2040   LORazepam (ATIVAN) tablet 2 mg  2 mg Oral TID PRN Phineas Inches, MD   2 mg at 01/07/23 1014   Or   LORazepam (ATIVAN) injection 2 mg  2 mg Intramuscular TID PRN Phineas Inches, MD   2 mg at 01/09/23 1341   magnesium hydroxide (MILK OF MAGNESIA) suspension 30 mL  30 mL Oral  Daily PRN Massengill, Harrold Donath, MD       nicotine polacrilex (NICORETTE) gum 2 mg  2 mg Oral PRN Starleen Blue, NP   2 mg at 01/08/23 1501   propranolol (INDERAL) tablet 10 mg  10 mg Oral BID Starleen Blue, NP   10 mg at 01/09/23 1308   simvastatin (ZOCOR) tablet 10 mg  10 mg Oral q1800 Starleen Blue, NP   10 mg at 01/08/23 1832   Vitamin D (Ergocalciferol) (DRISDOL) 1.25 MG (50000 UNIT) capsule 50,000 Units  50,000 Units Oral Q7 days Starleen Blue, NP   50,000 Units at 01/08/23 1016   PTA Medications: Medications Prior to Admission  Medication Sig Dispense Refill Last Dose   ARIPiprazole (ABILIFY) 10 MG tablet Take 1 tablet (10 mg total) by mouth as directed for 15 doses. Taper off oral abilify as follows: Take 2 tablets once daily for 5 days. Then decrease to taking 1 tablet once daily for 5 days. Then stop. 15 tablet 0    [START ON 01/15/2023] ARIPiprazole ER (ABILIFY MAINTENA) 400 MG SRER injection Inject 2 mLs (400 mg total) into the muscle every 28 (twenty-eight) days for 1 dose. Next dose is due on 01-15-23. 1 each 0    hydrOXYzine (ATARAX) 50 MG tablet Take 1 tablet (50 mg total) by mouth 3 (three) times daily as needed  for anxiety. 30 tablet 0    risperiDONE (RISPERDAL M-TABS) 1 MG disintegrating tablet Take 1 tablet (1 mg total) by mouth at bedtime. 30 tablet 0    simvastatin (ZOCOR) 10 MG tablet Take 1 tablet (10 mg total) by mouth daily at 6 PM for 14 days. 14 tablet 0    traZODone (DESYREL) 150 MG tablet Take 1 tablet (150 mg total) by mouth at bedtime. 30 tablet 0     Patient Stressors:    Patient Strengths:    Treatment Modalities: Medication Management, Group therapy, Case management,  1 to 1 session with clinician, Psychoeducation, Recreational therapy.   Physician Treatment Plan for Primary Diagnosis: Schizoaffective disorder, bipolar type (HCC) Long Term Goal(s): Improvement in symptoms so as ready for discharge   Short Term Goals: Ability to identify changes in  lifestyle to reduce recurrence of condition will improve Ability to verbalize feelings will improve Ability to disclose and discuss suicidal ideas Ability to demonstrate self-control will improve Ability to identify and develop effective coping behaviors will improve Ability to maintain clinical measurements within normal limits will improve Compliance with prescribed medications will improve  Medication Management: Evaluate patient's response, side effects, and tolerance of medication regimen.  Therapeutic Interventions: 1 to 1 sessions, Unit Group sessions and Medication administration.  Evaluation of Outcomes: Not Progressing  Physician Treatment Plan for Secondary Diagnosis: Principal Problem:   Schizoaffective disorder, bipolar type (HCC) Active Problems:   Hyperlipidemia   Type II diabetes mellitus (HCC)   GAD (generalized anxiety disorder)  Long Term Goal(s): Improvement in symptoms so as ready for discharge   Short Term Goals: Ability to identify changes in lifestyle to reduce recurrence of condition will improve Ability to verbalize feelings will improve Ability to disclose and discuss suicidal ideas Ability to demonstrate self-control will improve Ability to identify and develop effective coping behaviors will improve Ability to maintain clinical measurements within normal limits will improve Compliance with prescribed medications will improve     Medication Management: Evaluate patient's response, side effects, and tolerance of medication regimen.  Therapeutic Interventions: 1 to 1 sessions, Unit Group sessions and Medication administration.  Evaluation of Outcomes: Not Progressing   RN Treatment Plan for Primary Diagnosis: Schizoaffective disorder, bipolar type (HCC) Long Term Goal(s): Knowledge of disease and therapeutic regimen to maintain health will improve  Short Term Goals: Ability to remain free from injury will improve, Ability to verbalize frustration and  anger appropriately will improve, Ability to participate in decision making will improve, Ability to verbalize feelings will improve, Ability to identify and develop effective coping behaviors will improve, and Compliance with prescribed medications will improve  Medication Management: RN will administer medications as ordered by provider, will assess and evaluate patient's response and provide education to patient for prescribed medication. RN will report any adverse and/or side effects to prescribing provider.  Therapeutic Interventions: 1 on 1 counseling sessions, Psychoeducation, Medication administration, Evaluate responses to treatment, Monitor vital signs and CBGs as ordered, Perform/monitor CIWA, COWS, AIMS and Fall Risk screenings as ordered, Perform wound care treatments as ordered.  Evaluation of Outcomes: Not Progressing   LCSW Treatment Plan for Primary Diagnosis: Schizoaffective disorder, bipolar type (HCC) Long Term Goal(s): Safe transition to appropriate next level of care at discharge, Engage patient in therapeutic group addressing interpersonal concerns.  Short Term Goals: Engage patient in aftercare planning with referrals and resources, Increase social support, Increase emotional regulation, Facilitate acceptance of mental health diagnosis and concerns, Identify triggers associated with mental health/substance abuse issues,  and Increase skills for wellness and recovery  Therapeutic Interventions: Assess for all discharge needs, 1 to 1 time with Social worker, Explore available resources and support systems, Assess for adequacy in community support network, Educate family and significant other(s) on suicide prevention, Complete Psychosocial Assessment, Interpersonal group therapy.  Evaluation of Outcomes: Not Progressing   Progress in Treatment: Attending groups: Yes. Participating in groups: Yes. Taking medication as prescribed: Yes. Toleration medication:  Yes. Family/Significant other contact made: No, will contact:  once PT gives permission to contact family  Patient understands diagnosis: No. Discussing patient identified problems/goals with staff: Yes. Medical problems stabilized or resolved: Yes. Denies suicidal/homicidal ideation: Yes. Issues/concerns per patient self-inventory: No.     New problem(s) identified: No, Describe:  none reported   New Short Term/Long Term Goal(s):   medication stabilization, elimination of SI thoughts, development of comprehensive mental wellness plan.     Patient Goals:  "Help with depression"   Discharge Plan or Barriers: Patient recently admitted. CSW will continue to follow and assess for appropriate referrals and possible discharge planning.       Reason for Continuation of Hospitalization: Anxiety Delusions  Depression Mania Medication stabilization   Estimated Length of Stay: 5-7 days   Last 3 [place] Suicide Severity Risk Score: Flowsheet Row [facility]Current[facility] Most recent reading at 12/28/2022  5:00 PM Admission (Discharged) [facility] Most recent reading at 12/11/2022  9:27 PM ED from 12/11/2022 in [facility] Most recent reading at 12/11/2022 11:51 AM  C-SSRS RISK CATEGORY No Risk No Risk No Risk       Last PHQ 2/9 Scores:    12/10/2022    8:03 AM 09/17/2022    1:18 PM 07/24/2021    3:15 PM  Depression screen PHQ 2/9  Decreased Interest 0 1 0  Down, Depressed, Hopeless 3 0 0  PHQ - 2 Score 3 1 0  Altered sleeping 3 0   Tired, decreased energy 1 1   Change in appetite 1 0   Feeling bad or failure about yourself  1 0   Trouble concentrating 2 0   Moving slowly or fidgety/restless 2 0   Suicidal thoughts 0 0   PHQ-9 Score 13 2   Difficult doing work/chores Very difficult      Scribe for Treatment Team: Izell [place],  LCSW 01/09/2023 1:46 PM

## 2023-01-09 NOTE — Progress Notes (Signed)
Patient ID: Barbara George, female   DOB: 13-Oct-1995, 27 y.o.   MRN: [id number] IM agitation medication administered without incidence.

## 2023-01-09 NOTE — Progress Notes (Signed)
1:1 Note: Patient posturing with staff and throwing food and water in her room. Argumentative with staff and refusing to follow unit rules. Verbal redirection supplied and successful. Patient currently in dayroom showing no signs of distress. 1:1 monitoring continues. Patient remains safe at this time.

## 2023-01-09 NOTE — Progress Notes (Addendum)
Pt Inderal held due to pt Bp 105/51 standing , Pt continues to appears to know what she is doing and continues to do things for attention, pt knows who she can get away with various actions   01/09/23 2100  Psych Admission Type (Psych Patients Only)  Admission Status Involuntary  Psychosocial Assessment  Patient Complaints Anxiety;Agitation;Hyperactivity;Restlessness  Eye Contact Staring  Facial Expression Anxious;Animated  Affect Labile  Orthoptist Attention-seeking;Childlike;Sarcastic  Motor Activity Restless;Fidgety  Appearance/Hygiene Layered clothes  Behavior Characteristics Hyperactive;Restless  Mood Labile;Suspicious;Preoccupied  Aggressive Behavior  Effect No apparent injury  Thought Process  Coherency Circumstantial;Disorganized  Content Preoccupation  Delusions WDL  Perception WDL  Hallucination None reported or observed  Judgment Poor  Confusion WDL  Danger to Self  Current suicidal ideation? Denies  Danger to Others  Danger to Others None reported or observed

## 2023-01-09 NOTE — Progress Notes (Addendum)
Pt woke up around 2:00 am to use the restroom than began to brush her teeth. Pt turned on light insisting to "go outside". Pt was redirected to lay back down by this Clinical research associate. Pt sat on bed talking and singing loudly with multiple attempts to redirect. Pt is not redirectable. Pt than got up to attempt to shower and was redirected to turn shower off. Pt threaten this Clinical research associate stating "I don't want to fight you". Pt gathered multiple outfits and went in the bathroom to put several layers of clothing on. At this time RN stepped in to assist and pt stated "I do not listen to women". During this time tech on hall stepped in to help intervene. Pt consistently tried to leave her bedroom to disturb the milieu.

## 2023-01-09 NOTE — Plan of Care (Signed)
  Problem: Activity: Goal: Interest or engagement in activities will improve Outcome: Progressing   Problem: Education: Goal: Emotional status will improve Outcome: Not Progressing Goal: Mental status will improve Outcome: Not Progressing Goal: Verbalization of understanding the information provided will improve Outcome: Not Progressing   Problem: Activity: Goal: Sleeping patterns will improve Outcome: Not Progressing

## 2023-01-09 NOTE — Progress Notes (Signed)
The patient has been awake for nearly an hour and has been causing problems. First of all, the patient has been talking loudly and singing in her bedroom. Secondly, she has been attempting to take a shower repeatedly. In addition, the patient has threatened to harm both the nurse and the sitter. At one point, she stated that she does not listen to women. The patient has been given medication during the past half hour along with a snack. Finally, the patient has made a mess out of her room.

## 2023-01-09 NOTE — Progress Notes (Signed)
[facility] MD Progress Note  01/09/2023 4:16 PM Barbara George  MRN:  [id number]  Reason For Admission: Barbara George is a 27 year old African-American female with a past psychiatric history significant for schizoaffective disorder (bipolar type) who was admitted to [facility] from [facility], ED after presenting to the ED on 7/25 by her husband and father because she stopped eating, stopped talking, and became more withdrawn the day prior.  Per patient's father, history of nonmedication compliance and patient typically after discharge, which always leads to a relapse in her symptoms.  Patient was already receiving transferred back to [facility] under [facility] for treatment and stabilization of her mental status.  Patient assessment note:  At start of today's encounter, pt was sitting in the djay room with her peers, and able to follow writer's directions to go to her room to talk, but once in the room, behaviors became bizarre; she became selectively mute, not answering writer's questions, went to the window and stared outside blankly, turned around and came in front of writer, and made a "split", where she went on the floor and spread both legs wide apart from each other.   Patient remains very difficult to redirect, is argumentative with her 1:1 staff members as per their reports, not always compliant with directions, remains intrusive, and disorganized and illogical. Pt was able to answer some questions by nodding head "yes" or "no". She denies SI/HI/VH. When asked about VH, she nodded "yes", and would not elaborate. When asked if she still thinks that she has special powers, she nodded "yes". Yesterday, pt stated that she has not been swallowing, her medications. With the persistent nature of her psychosis, this is possible. We are switching medications to IM for now with a goal of clearing psychosis, and then switching, back to PO once psychosis begins to resolve.  Switching  meds from Thorazine 25 mg TID to Thorazine 50 mg IM TID for psychosis and mood stabilization. Continuing other meds as listed below.  No TD/EPS type symptoms found on assessment, and pt denies any feelings of stiffness. AIMS: 0.   Principal Problem: Schizoaffective disorder, bipolar type (HCC) Diagnosis: Principal Problem:   Schizoaffective disorder, bipolar type (HCC) Active Problems:   Hyperlipidemia   Type II diabetes mellitus (HCC)   GAD (generalized anxiety disorder)  Total Time spent with patient: 35 minutes   Past Psychiatric History:  See H&P  Past Medical History:  Past Medical History:  Diagnosis Date   Bipolar 1 disorder (HCC)    Depression    Diabetes mellitus without complication (HCC)    Type 2 (Pre)   Schizophrenia (HCC)    History reviewed. No pertinent surgical history. Family History:  Family History  Problem Relation Age of Onset   Hypertension Father    Family Psychiatric  History:  See H&P  Social History:  Social History   Substance and Sexual Activity  Alcohol Use No   Alcohol/week: 0.0 standard drinks of alcohol     Social History   Substance and Sexual Activity  Drug Use No    Social History   Socioeconomic History   Marital status: Married    Spouse name: Amadou   Number of children: Not on file   Years of education: Not on file   Highest education level: Associate degree: occupational, Scientist, product/process development, or vocational program  Occupational History   Occupation: unemployed  Tobacco Use   Smoking status: Never   Smokeless tobacco: Never  Vaping Use   Vaping  status: Never Used  Substance and Sexual Activity   Alcohol use: No    Alcohol/week: 0.0 standard drinks of alcohol   Drug use: No   Sexual activity: Yes    Birth control/protection: None  Other Topics Concern   Not on file  Social History Narrative   ** Merged History Encounter **       Social Determinants of Health   Financial Resource Strain: Not on file  Food  Insecurity: Patient Declined (12/28/2022)   Hunger Vital Sign    Worried About Running Out of Food in the Last Year: Patient declined    Ran Out of Food in the Last Year: Patient declined  Transportation Needs: Patient Declined (12/28/2022)   PRAPARE - Administrator, Civil Service (Medical): Patient declined    Lack of Transportation (Non-Medical): Patient declined  Physical Activity: Inactive (04/03/2019)   Exercise Vital Sign    Days of Exercise per Week: 0 days    Minutes of Exercise per Session: 0 min  Stress: Not on file  Social Connections: Socially Integrated (04/03/2019)   Social Connection and Isolation Panel [NHANES]    Frequency of Communication with Friends and Family: More than three times a week    Frequency of Social Gatherings with Friends and Family: Once a week    Attends Religious Services: More than 4 times per year    Active Member of [employer] or Organizations: Yes    Attends Engineer, structural: Not on file    Marital Status: Married   Sleep: Unable to assess  Appetite: Unable to assess  Current Medications: Current Facility-Administered Medications  Medication Dose Route Frequency Provider Last Rate Last Admin   acetaminophen (TYLENOL) tablet 650 mg  650 mg Oral Q6H PRN Massengill, Harrold Donath, MD   650 mg at 01/06/23 1049   alum & mag hydroxide-simeth (MAALOX/MYLANTA) 200-200-20 MG/5ML suspension 30 mL  30 mL Oral Q4H PRN Massengill, Harrold Donath, MD       benztropine (COGENTIN) tablet 1 mg  1 mg Oral BID PRN Starleen Blue, NP       Or   benztropine mesylate (COGENTIN) injection 1 mg  1 mg Intramuscular BID PRN Starleen Blue, NP       chlorproMAZINE (THORAZINE) tablet 50 mg  50 mg Oral TID PRN Phineas Inches, MD   50 mg at 01/09/23 0210   Or   chlorproMAZINE (THORAZINE) injection 25 mg  25 mg Intramuscular TID PRN Phineas Inches, MD   25 mg at 01/09/23 1331   chlorproMAZINE (THORAZINE) injection 50 mg  50 mg Intramuscular TID Starleen Blue, NP       chlorproMAZINE (THORAZINE) tablet 50 mg  50 mg Oral QHS PRN Massengill, Harrold Donath, MD   50 mg at 01/08/23 0021   cloNIDine (CATAPRES) tablet 0.1 mg  0.1 mg Oral Q6H PRN Abbott Pao, Nadir, MD       diphenhydrAMINE (BENADRYL) capsule 50 mg  50 mg Oral TID PRN Massengill, Harrold Donath, MD   50 mg at 01/07/23 1014   Or   diphenhydrAMINE (BENADRYL) injection 50 mg  50 mg Intramuscular TID PRN Massengill, Harrold Donath, MD   50 mg at 01/09/23 1341   hydrOXYzine (ATARAX) tablet 50 mg  50 mg Oral TID PRN Phineas Inches, MD   50 mg at 01/08/23 2040   LORazepam (ATIVAN) tablet 2 mg  2 mg Oral TID PRN Phineas Inches, MD   2 mg at 01/07/23 1014   Or   LORazepam (ATIVAN) injection 2 mg  2 mg Intramuscular TID PRN Phineas Inches, MD   2 mg at 01/09/23 1341   magnesium hydroxide (MILK OF MAGNESIA) suspension 30 mL  30 mL Oral Daily PRN Massengill, Harrold Donath, MD       nicotine polacrilex (NICORETTE) gum 2 mg  2 mg Oral PRN Starleen Blue, NP   2 mg at 01/08/23 1501   propranolol (INDERAL) tablet 10 mg  10 mg Oral BID Starleen Blue, NP   10 mg at 01/09/23 1610   simvastatin (ZOCOR) tablet 10 mg  10 mg Oral q1800 Starleen Blue, NP   10 mg at 01/08/23 1832   Vitamin D (Ergocalciferol) (DRISDOL) 1.25 MG (50000 UNIT) capsule 50,000 Units  50,000 Units Oral Q7 days Starleen Blue, NP   50,000 Units at 01/08/23 1016    Lab Results:  No results found for this or any previous visit (from the past 48 hour(s)).    Blood Alcohol level:  Lab Results  Component Value Date   ETH <10 12/27/2022   ETH <10 12/11/2022    Metabolic Disorder Labs: Lab Results  Component Value Date   HGBA1C 6.2 (H) 12/11/2022   MPG 131 12/11/2022   MPG 111.15 10/23/2018   Lab Results  Component Value Date   PROLACTIN 34.3 (H) 12/30/2022   PROLACTIN 13.0 12/11/2022   Lab Results  Component Value Date   CHOL 137 01/05/2023   TRIG 62 01/05/2023   HDL 62 01/05/2023   CHOLHDL 2.2 01/05/2023   VLDL 12 01/05/2023   LDLCALC  63 01/05/2023   LDLCALC 113 (H) 12/11/2022   Physical Findings: AIMS: 0Overall Severity Severity of abnormal movements (highest score from questions above): None, normal Incapacitation due to abnormal movements: None, normal Patient's awareness of abnormal movements (rate only patient's report): No Awareness, Dental Status Current problems with teeth and/or dentures?: No Does patient usually wear dentures?: No  CIWA:    COWS:     Musculoskeletal: Strength & Muscle Tone: within normal limits Gait & Station: normal Patient leans: N/A  Psychiatric Specialty Exam:  Presentation  General Appearance:  Appropriate for Environment; Fairly Groomed  Eye Contact: None  Speech: -- (selective mutism)  Speech Volume: Increased  Handedness: Right   Mood and Affect  Mood: Good  Affect: Labile, inappropriate  Thought Process  Thought Processes: -- (UTA)  Descriptions of Associations:-- (UTA)  Orientation:-- (UTA)  Thought Content:Illogical  History of Schizophrenia/Schizoaffective disorder:Yes  Duration of Psychotic Symptoms:Greater than six months  Hallucinations:Couldn't assess  Ideas of Reference:Delusions  Suicidal Thoughts:Suicidal Thoughts: No    Homicidal Thoughts:Homicidal Thoughts: No    Sensorium  Memory: Couldn't assess  Judgment: Poor  Insight: Poor   Executive Functions  Concentration: Poor  Attention Span: Poor  Recall: Poor  Fund of Knowledge: Poor  Language: Poor   Psychomotor Activity  Psychomotor Activity: Psychomotor Activity: Normal    Assets  Assets: Resilience   Sleep  Sleep: Sleep: Good    Physical Exam: Constitutional:      Appearance: Normal appearance.  HENT:     Head: Normocephalic and atraumatic.  Eyes:     Extraocular Movements: Extraocular movements intact.  Cardiovascular:     Rate and Rhythm: Normal rate and regular rhythm.  Pulmonary:     Effort: Pulmonary effort is normal.    Musculoskeletal:        General: Normal range of motion.     Cervical back: Normal range of motion.  Skin:    General: Skin is warm and dry.   Review of  Systems  Constitutional: Negative.   HENT: Negative.    Eyes: Negative.   Respiratory: Negative.    Cardiovascular: Negative.   Gastrointestinal: Negative.   Skin: Negative.   Neurological: Negative.  Blood pressure 130/82, pulse 93, temperature 97.9 F (36.6 C), temperature source Oral, resp. rate 16, height 5\' 8"  (1.727 m), weight 101.2 kg, SpO2 100%. Body mass index is 33.91 kg/m.   Treatment Plan Summary: Daily contact with patient to assess and evaluate symptoms and progress in treatment and Medication management  Plan:  Patient exhibited thought blocking and was mostly mute during most of the assessment.  When patient was able to communicate, patient provided bizarre responses to the questions addressed to her.  Patient to remain on her current psychiatric medication regimen.  Patient to continue to be evaluated and assessed to determine efficacy of treatment.  Safety and Monitoring: Voluntary admission to inpatient psychiatric unit for safety, stabilization and treatment Daily contact with patient to assess and evaluate symptoms and progress in treatment Patient's case to be discussed in multi-disciplinary team meeting Observation Level : q15 minute checks Vital signs: q12 hours Precautions: Safety  Diagnosis: Principal Problem:   Schizoaffective disorder, bipolar type (HCC) Active Problems:   Hyperlipidemia   Type II diabetes mellitus (HCC)   GAD (generalized anxiety disorder)  #Schizoaffective disorder (bipolar type) -Continue Thorazine 50 mg TID for psychosis/mood stabilization & Switch from PO to IM due to concerns for cheeking meds. -Discontinued Haldol 10 mg TID on 8/4 due to lack of effectiveness -Previously discontinued risperidone 2 mg  BID as psychosis was persistent -Continue Cogentin to 1 mg BID PRN  for EPS prophylaxis   Agitation protocol: -Diphenhydramine 50 mg p.o. or injection, 3 times daily as needed for agitation -Continue Thorazine 50 mg TID PRN for agitation - Previously Discontinued Haloperidol 5 mg p.o. or injection, 3 times daily as needed for agitation -Lorazepam 2 mg p.o. or injection, 3 times daily as needed for agitation  #Generalized anxiety disorder -Continue hydroxyzine 50 mg 3 times daily as needed for anxiety -Continue propranolol 10 mg 2 times daily for anxiety  #Hyperlipidemia -Continue simvastatin 10 mg daily  Insomnia -Continue trazodone 150 mg at bedtime for the management of insomnia  Hypertension -Continue clonidine 0.1 mg every 6 hours as needed for systolic blood pressure greater than 160 or diastolic blood pressure less greater than 100  Low Vitamin D level -Continue Vitamin D 50.000 units weekly  As needed medications: -Patient to continue taking Tylenol 650 mg every 6 hours as needed for mild pain -Patient to continue taking Maalox/Mylanta 30 mL every 4 hours as needed for indigestion -Patient to continue taking Milk of Magnesia 30 mL as needed for mild constipation  Labs reviewed: Ammonia level was found to be within normal limits (<10 umol/L).  Vitamin D level was found to be decreased (20.09 ng/mL), supple.menting.  Prolactin level slightly elevated at 34. [name]  Discharge Planning: Social work and case management to assist with discharge planning and identification of hospital follow-up needs prior to discharge Estimated LOS: 5-7 days Discharge Concerns: Need to establish a safety plan; Medication compliance and effectiveness Discharge Goals: Return home with outpatient referrals for mental health follow-up including medication management/psychotherapy   I certify that inpatient services furnished can reasonably be expected to improve the patient's condition.    Starleen Blue, NP  Patient ID: Barbara George, female   DOB:  1995-07-09, 27 y.o.   MRN: [id number]

## 2023-01-09 NOTE — Progress Notes (Addendum)
Pt has been up yelling waking other patients up , pt has been throwing drinks on the floor , pt disruptive on the unit and could not be redirected , pt given PRN agitation protocol per [facility] , pt accepted medication without incident.
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
## 2023-01-10 DIAGNOSIS — F250 Schizoaffective disorder, bipolar type: Secondary | ICD-10-CM | POA: Diagnosis not present

## 2023-01-10 MED ORDER — CHLORPROMAZINE HCL 25 MG/ML IJ SOLN
75.0000 mg | Freq: Three times a day (TID) | INTRAMUSCULAR | Status: DC
  Administered 2023-01-10 – 2023-01-11 (×2): 75 mg via INTRAMUSCULAR
  Filled 2023-01-10 (×9): qty 3

## 2023-01-10 NOTE — Group Note (Signed)
Date:  01/10/2023 Time:  10:42 AM  Group Topic/Focus:  Goals Group:   The focus of this group is to help patients establish daily goals to achieve during treatment and discuss how the patient can incorporate goal setting into their daily lives to aide in recovery.    Participation Level:  Active  Participation Quality:  Attentive  Affect:  Labile  Cognitive:  Confused Insight: Lacking  Engagement in Group:  Distracting  Modes of Intervention:  Limit-setting  Additional Comments:  Pt said her goal is " To share my coffee with everybody" Pt could not elaborate.   Donell Beers 01/10/2023, 10:42 AM
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
## 2023-01-10 NOTE — Progress Notes (Addendum)
[facility] MD Progress Note  01/10/2023 6:46 PM Barbara George  MRN:  [id number]  Reason For Admission: Barbara George is a 27 year old African-American female with a past psychiatric history significant for schizoaffective disorder (bipolar type) who was admitted to [facility] from [facility], ED after presenting to the ED on 7/25 by her husband and father because she stopped eating, stopped talking, and became more withdrawn the day prior.  Per patient's father, history of nonmedication compliance and patient typically after discharge, which always leads to a relapse in her symptoms.  Patient was already receiving transferred back to [facility] under IVC for treatment and stabilization of her mental status.  Yesterday the psychiatry team made the following recommendations:  -Increase Thorazine from 50 mg TID to Thorazine 75 mg IM 3 times daily for psychosis/mood stabilization (Switch from PO to IM due to concerns for cheeking meds). -Continue Cogentin to 1 mg BID PRN for EPS prophylaxis  -Discontinued Haldol 10 mg TID on 8/4 due to lack of effectiveness -Previously discontinued risperidone 2 mg  BID as psychosis was persistent  Agitation protocol: -Diphenhydramine 50 mg p.o. or injection, 3 times daily as needed for agitation -Continue Thorazine 50 mg TID PRN for agitation - Previously Discontinued Haloperidol 5 mg p.o. or injection, 3 times daily as needed for agitation -Lorazepam 2 mg p.o. or injection, 3 times daily as needed for agitation  #Generalized anxiety disorder -Continue hydroxyzine 50 mg 3 times daily as needed for anxiety -Continue propranolol 10 mg 2 times daily for anxiety  Patient assessment note:  Patient is seen and examined in her room sitting on her bed.  She continues on one-to-one monitoring for safety.  She is alert, oriented to person, place and situation.  Speech is clear, fluent, however disorganized, childlike, argumentative and  oppositional.  When asked about her mood, patient started talking about the statute of [place] and some type of bird.  She then talked about Anette Riedel and how he sent a Egbert Garibaldi to check if the earth was dry.  Only able to answer yes or no questions today but other speech is circumstantial and tangential.  Patient is talkative today however tangential.  Patient remains argumentative with her 1:1 staff members as per their reports, not always compliant with directions, remains intrusive, and disorganized and illogical.  She denies SI/HI/AVH.  When asked about her father as her support system, patient reports that she does not know where her father is.  Added, "I have a husband and that is all I know".   We are continuing to to administer Thorazine IM for now with a goal of clearing psychosis, and then switching, back to PO once psychosis begins to resolve.  We are increasing Thorazine 50 mg TID to Thorazine 75 mg IM TID for psychosis and mood stabilization.  As needed Thorazine 50 mg at bedtime for sleep is discontinued at this time due to being over the IM recommended dosage of 200 mg per day, as per the pharmacist.  Continuing other meds as listed below.  EKG obtained due to increase Thorazine dosing. EKG report: NSR, ventricular rate 88, QT/QTc 360/435.  Future plan is to recommend ECT trial if failure of Thorazine.  No TD/EPS type symptoms found on assessment, and pt denies any feelings of stiffness. AIMS: None recorded per Nursing staff at this time.  Principal Problem: Schizoaffective disorder, bipolar type (HCC) Diagnosis: Principal Problem:   Schizoaffective disorder, bipolar type (HCC) Active Problems:   Hyperlipidemia   Type  II diabetes mellitus (HCC)   GAD (generalized anxiety disorder)  Total Time spent with patient: 35 minutes   Past Psychiatric History:  See H&P  Past Medical History:  Past Medical History:  Diagnosis Date   Bipolar 1 disorder (HCC)    Depression    Diabetes mellitus  without complication (HCC)    Type 2 (Pre)   Schizophrenia (HCC)    History reviewed. No pertinent surgical history. Family History:  Family History  Problem Relation Age of Onset   Hypertension Father    Family Psychiatric  History:  See H&P  Social History:  Social History   Substance and Sexual Activity  Alcohol Use No   Alcohol/week: 0.0 standard drinks of alcohol     Social History   Substance and Sexual Activity  Drug Use No    Social History   Socioeconomic History   Marital status: Married    Spouse name: Amadou   Number of children: Not on file   Years of education: Not on file   Highest education level: Associate degree: occupational, Scientist, product/process development, or vocational program  Occupational History   Occupation: unemployed  Tobacco Use   Smoking status: Never   Smokeless tobacco: Never  Vaping Use   Vaping status: Never Used  Substance and Sexual Activity   Alcohol use: No    Alcohol/week: 0.0 standard drinks of alcohol   Drug use: No   Sexual activity: Yes    Birth control/protection: None  Other Topics Concern   Not on file  Social History Narrative   ** Merged History Encounter **       Social Determinants of Health   Financial Resource Strain: Not on file  Food Insecurity: Patient Declined (12/28/2022)   Hunger Vital Sign    Worried About Running Out of Food in the Last Year: Patient declined    Ran Out of Food in the Last Year: Patient declined  Transportation Needs: Patient Declined (12/28/2022)   PRAPARE - Administrator, Civil Service (Medical): Patient declined    Lack of Transportation (Non-Medical): Patient declined  Physical Activity: Inactive (04/03/2019)   Exercise Vital Sign    Days of Exercise per Week: 0 days    Minutes of Exercise per Session: 0 min  Stress: Not on file  Social Connections: Socially Integrated (04/03/2019)   Social Connection and Isolation Panel [NHANES]    Frequency of Communication with Friends and  Family: More than three times a week    Frequency of Social Gatherings with Friends and Family: Once a week    Attends Religious Services: More than 4 times per year    Active Member of [employer] or Organizations: Yes    Attends Engineer, structural: Not on file    Marital Status: Married   Sleep: Unable to assess  Appetite: Unable to assess  Current Medications: Current Facility-Administered Medications  Medication Dose Route Frequency Provider Last Rate Last Admin   acetaminophen (TYLENOL) tablet 650 mg  650 mg Oral Q6H PRN Massengill, Harrold Donath, MD   650 mg at 01/06/23 1049   alum & mag hydroxide-simeth (MAALOX/MYLANTA) 200-200-20 MG/5ML suspension 30 mL  30 mL Oral Q4H PRN Massengill, Nathan, MD       benztropine (COGENTIN) tablet 1 mg  1 mg Oral BID PRN Starleen Blue, NP       Or   benztropine mesylate (COGENTIN) injection 1 mg  1 mg Intramuscular BID PRN Starleen Blue, NP       chlorproMAZINE (THORAZINE)  tablet 50 mg  50 mg Oral TID PRN Phineas Inches, MD   50 mg at 01/09/23 0210   Or   chlorproMAZINE (THORAZINE) injection 25 mg  25 mg Intramuscular TID PRN Phineas Inches, MD   25 mg at 01/10/23 0320   chlorproMAZINE (THORAZINE) injection 75 mg  75 mg Intramuscular TID Royalti Schauf, Jesusita Oka, FNP       chlorproMAZINE (THORAZINE) tablet 50 mg  50 mg Oral QHS PRN Massengill, Harrold Donath, MD   50 mg at 01/08/23 0021   cloNIDine (CATAPRES) tablet 0.1 mg  0.1 mg Oral Q6H PRN Sarita Bottom, MD       diphenhydrAMINE (BENADRYL) capsule 50 mg  50 mg Oral TID PRN Phineas Inches, MD   50 mg at 01/07/23 1014   Or   diphenhydrAMINE (BENADRYL) injection 50 mg  50 mg Intramuscular TID PRN Phineas Inches, MD   50 mg at 01/10/23 0321   hydrOXYzine (ATARAX) tablet 50 mg  50 mg Oral TID PRN Phineas Inches, MD   50 mg at 01/10/23 1734   LORazepam (ATIVAN) tablet 2 mg  2 mg Oral TID PRN Phineas Inches, MD   2 mg at 01/07/23 1014   Or   LORazepam (ATIVAN) injection 2 mg  2 mg  Intramuscular TID PRN Phineas Inches, MD   2 mg at 01/10/23 0321   magnesium hydroxide (MILK OF MAGNESIA) suspension 30 mL  30 mL Oral Daily PRN Massengill, Harrold Donath, MD       nicotine polacrilex (NICORETTE) gum 2 mg  2 mg Oral PRN Starleen Blue, NP   2 mg at 01/08/23 1501   propranolol (INDERAL) tablet 10 mg  10 mg Oral BID Starleen Blue, NP   10 mg at 01/10/23 7829   simvastatin (ZOCOR) tablet 10 mg  10 mg Oral q1800 Starleen Blue, NP   10 mg at 01/10/23 1700   Vitamin D (Ergocalciferol) (DRISDOL) 1.25 MG (50000 UNIT) capsule 50,000 Units  50,000 Units Oral Q7 days Starleen Blue, NP   50,000 Units at 01/08/23 1016   Lab Results:  No results found for this or any previous visit (from the past 48 hour(s)).  Blood Alcohol level:  Lab Results  Component Value Date   ETH <10 12/27/2022   ETH <10 12/11/2022   Metabolic Disorder Labs: Lab Results  Component Value Date   HGBA1C 6.2 (H) 12/11/2022   MPG 131 12/11/2022   MPG 111.15 10/23/2018   Lab Results  Component Value Date   PROLACTIN 34.3 (H) 12/30/2022   PROLACTIN 13.0 12/11/2022   Lab Results  Component Value Date   CHOL 137 01/05/2023   TRIG 62 01/05/2023   HDL 62 01/05/2023   CHOLHDL 2.2 01/05/2023   VLDL 12 01/05/2023   LDLCALC 63 01/05/2023   LDLCALC 113 (H) 12/11/2022   Physical Findings: AIMS: 0Overall Severity Severity of abnormal movements (highest score from questions above): None, normal Incapacitation due to abnormal movements: None, normal Patient's awareness of abnormal movements (rate only patient's report): No Awareness, Dental Status Current problems with teeth and/or dentures?: No Does patient usually wear dentures?: No  CIWA:    COWS:     Musculoskeletal: Strength & Muscle Tone: within normal limits Gait & Station: normal Patient leans: N/A  Psychiatric Specialty Exam:  Presentation  General Appearance:  Casual  Eye Contact: Fair  Speech: Clear and Coherent (Disorganized  speech)  Speech Volume: Normal  Handedness: Right  Mood and Affect  Mood: Good  Affect: Labile, inappropriate  Thought Process  Thought Processes: Coherent; Disorganized  Descriptions of Associations:Circumstantial  Orientation:Full (Time, Place and Person)  Thought Content:Illogical; Rumination; Scattered; Tangential  History of Schizophrenia/Schizoaffective disorder:Yes  Duration of Psychotic Symptoms:Greater than six months  Hallucinations:Couldn't assess  Ideas of Reference:Delusions  Suicidal Thoughts:Suicidal Thoughts: No   Homicidal Thoughts:Homicidal Thoughts: No  Sensorium  Memory: Couldn't assess  Judgment: Poor  Insight: Poor  Executive Functions  Concentration: Fair  Attention Span: Fair  Recall: Poor  Fund of Knowledge: Poor  Language: Fair  Psychomotor Activity  Psychomotor Activity: Psychomotor Activity: Normal  Assets  Assets: Physical Health; Resilience; Social Support  Sleep  Sleep: Sleep: Good Number of Hours of Sleep: 7.5  Physical Exam: Constitutional:      Appearance: Normal appearance.  HENT:     Head: Normocephalic and atraumatic.  Eyes:     Extraocular Movements: Extraocular movements intact.  Cardiovascular:     Rate and Rhythm: Normal rate and regular rhythm.  Pulmonary:     Effort: Pulmonary effort is normal.   Musculoskeletal:        General: Normal range of motion.     Cervical back: Normal range of motion.  Skin:    General: Skin is warm and dry.   Review of Systems  Constitutional: Negative.   HENT: Negative.    Eyes: Negative.   Respiratory: Negative.    Cardiovascular: Negative.   Gastrointestinal: Negative.   Skin: Negative.   Neurological: Negative.  Blood pressure 130/86, pulse (!) 113, temperature 97.9 F (36.6 C), temperature source Oral, resp. rate 20, height 5\' 8"  (1.727 m), weight 101.2 kg, SpO2 100%. Body mass index is 33.91 kg/m.  Treatment Plan Summary: Daily  contact with patient to assess and evaluate symptoms and progress in treatment and Medication management  Plan:  Patient exhibited thought blocking and was mostly mute during most of the assessment.  When patient was able to communicate, patient provided bizarre responses to the questions addressed to her.  Patient to remain on her current psychiatric medication regimen.  Patient to continue to be evaluated and assessed to determine efficacy of treatment.  Safety and Monitoring: Voluntary admission to inpatient psychiatric unit for safety, stabilization and treatment Daily contact with patient to assess and evaluate symptoms and progress in treatment Patient's case to be discussed in multi-disciplinary team meeting Observation Level : q15 minute checks Vital signs: q12 hours Precautions: Safety  Diagnosis: Principal Problem:   Schizoaffective disorder, bipolar type (HCC) Active Problems:   Hyperlipidemia   Type II diabetes mellitus (HCC)   GAD (generalized anxiety disorder)  #Schizoaffective disorder (bipolar type) -Increase Thorazine from 50 mg TID to Thorazine 75 mg IM 3 times daily for psychosis/mood stabilization (Was Switched from PO to IM due to concerns for cheeking meds). -Continue Cogentin to 1 mg BID PRN for EPS prophylaxis  -Discontinued Haldol 10 mg TID on 8/4 due to lack of effectiveness -Previously discontinued risperidone 2 mg  BID as psychosis was persistent  Agitation protocol: -Diphenhydramine 50 mg p.o. or injection, 3 times daily as needed for agitation -Continue Thorazine 50 mg TID PRN for agitation - Previously Discontinued Haloperidol 5 mg p.o. or injection, 3 times daily as needed for agitation -Lorazepam 2 mg p.o. or injection, 3 times daily as needed for agitation  #Generalized anxiety disorder -Continue hydroxyzine 50 mg 3 times daily as needed for anxiety -Continue propranolol 10 mg 2 times daily for anxiety  #Hyperlipidemia -Continue simvastatin 10 mg  daily  Insomnia -Continue trazodone 150 mg at bedtime for the management  of insomnia  Hypertension -Continue clonidine 0.1 mg every 6 hours as needed for systolic blood pressure greater than 160 or diastolic blood pressure less greater than 100  Low Vitamin D level -Continue Vitamin D 50.000 units weekly  As needed medications: -Patient to continue taking Tylenol 650 mg every 6 hours as needed for mild pain -Patient to continue taking Maalox/Mylanta 30 mL every 4 hours as needed for indigestion -Patient to continue taking Milk of Magnesia 30 mL as needed for mild constipation  Labs reviewed: Ammonia level was found to be within normal limits (<10 umol/L).  Vitamin D level was found to be decreased (20.09 ng/mL), supplimenting.  Prolactin level slightly elevated at 34. [name]  Discharge Planning: Social work and case management to assist with discharge planning and identification of hospital follow-up needs prior to discharge Estimated LOS: 5-7 days Discharge Concerns: Need to establish a safety plan; Medication compliance and effectiveness Discharge Goals: Return home with outpatient referrals for mental health follow-up including medication management/psychotherapy   I certify that inpatient services furnished can reasonably be expected to improve the patient's condition.    Cecilie Lowers, FNP  Patient ID: Barbara George, female   DOB: 1995/09/04, 27 y.o.   MRN: [id number] Patient ID: Barbara George, female   DOB: 10-23-1995, 27 y.o.   MRN: [id number]

## 2023-01-10 NOTE — Group Note (Unsigned)
Date:  01/10/2023 Time:  10:16 AM  Group Topic/Focus:  Goals Group:   The focus of this group is to help patients establish daily goals to achieve during treatment and discuss how the patient can incorporate goal setting into their daily lives to aide in recovery.     Participation Level:  {BHH PARTICIPATION UXLKG:40102}  Participation Quality:  {BHH PARTICIPATION QUALITY:22265}  Affect:  {BHH AFFECT:22266}  Cognitive:  {BHH COGNITIVE:22267}  Insight: {BHH Insight2:20797}  Engagement in Group:  {BHH ENGAGEMENT IN VOZDG:64403}  Modes of Intervention:  {BHH MODES OF INTERVENTION:22269}  Additional Comments:  ***  Donell Beers 01/10/2023, 10:16 AM

## 2023-01-10 NOTE — Progress Notes (Signed)
Patient being monitored 1:1 with no further incidents this shift. Patient has engaged with staff and peers appropriately, followed staff direction, and has been compliant with medications. Continue to monitor patient per continuous observation protocol.

## 2023-01-10 NOTE — Group Note (Addendum)
Date:  01/10/2023 Time:  8:49 PM  Group Topic/Focus:  Wrap-Up Group:   The focus of this group is to help patients review their daily goal of treatment and discuss progress on daily workbooks.    Participation Level:  Active  Participation Quality:  Appropriate  Affect:  Appropriate  Cognitive:  Appropriate  Insight: Appropriate  Engagement in Group:  Limited  Modes of Intervention:  Discussion  Additional Comments:  Pt stated her goal for today was to focus on her treatment plan. Pt stated she accomplished her goal today. Pt stated she did not get a chance to speak with a doctor or with a social worker about her care today. Pt stated she could not rate her day. Pt stated her husband coming for visitation tonight improved her overall day. Pt stated she felt better about herself tonight. Pt stated she was not able to attend any meals because she is on unit restriction, so staff brought back all her meals. Pt stated she took all medications provided today. Pt stated her appetite was pretty good today. Pt rated her sleep last night was fair. Pt stated the goal tonight was to get some rest. Pt stated she had no physical pain tonight. Pt deny visual hallucinations and auditory issues tonight. Pt denies thoughts of harming herself or others. Pt stated she would alert staff if anything changed.  Felipa Furnace 01/10/2023, 8:49 PM

## 2023-01-10 NOTE — Progress Notes (Signed)
Patient being monitored by 1:1 staff. Patient has been threatening to staff stating to writer,  "I want to kick your ass!" Patient was redirected and offered additional support from staff and ceased threatening behaviors. Continue to monitor per 1:1 protocol.

## 2023-01-10 NOTE — Group Note (Unsigned)
Date:  01/10/2023 Time:  10:26 AM  Group Topic/Focus:  Goals Group:   The focus of this group is to help patients establish daily goals to achieve during treatment and discuss how the patient can incorporate goal setting into their daily lives to aide in recovery.     Participation Level:  {BHH PARTICIPATION DGUYQ:03474}  Participation Quality:  {BHH PARTICIPATION QUALITY:22265}  Affect:  {BHH AFFECT:22266}  Cognitive:  {BHH COGNITIVE:22267}  Insight: {BHH Insight2:20797}  Engagement in Group:  {BHH ENGAGEMENT IN QVZDG:38756}  Modes of Intervention:  {BHH MODES OF INTERVENTION:22269}  Additional Comments:  ***  Donell Beers 01/10/2023, 10:26 AM

## 2023-01-10 NOTE — Progress Notes (Signed)
Nursing 1:1 note D:Pt observed sleeping in bed with eyes closed. RR even and unlabored. No distress noted. A: 1:1 observation continues for safety  R: pt remains safe  

## 2023-01-10 NOTE — Progress Notes (Signed)
   01/10/23 2015  Psych Admission Type (Psych Patients Only)  Admission Status Involuntary  Psychosocial Assessment  Patient Complaints Anxiety  Eye Contact Staring  Facial Expression Anxious;Animated  Affect Labile  Orthoptist Attention-seeking;Childlike;Sarcastic  Motor Activity Restless;Fidgety  Appearance/Hygiene Layered clothes  Behavior Characteristics Restless;Cooperative  Mood Suspicious;Pleasant  Aggressive Behavior  Effect No apparent injury  Thought Process  Coherency Circumstantial;Disorganized  Content Preoccupation  Delusions WDL  Perception WDL  Hallucination None reported or observed  Judgment Poor  Confusion WDL  Danger to Self  Current suicidal ideation? Denies  Danger to Others  Danger to Others None reported or observed

## 2023-01-10 NOTE — Progress Notes (Signed)
Pt up turing on the shower, letting the water run in the bathroom, pt throwing her clothes in the bathroom floor , pt continuing to be disruptive , pt being loud and disruptive waking up peers

## 2023-01-10 NOTE — Plan of Care (Signed)
  Problem: Education: Goal: Emotional status will improve Outcome: Progressing   Problem: Activity: Goal: Interest or engagement in activities will improve Outcome: Progressing Goal: Sleeping patterns will improve Outcome: Progressing   Problem: Safety: Goal: Periods of time without injury will increase Outcome: Progressing   Problem: Education: Goal: Knowledge of the prescribed therapeutic regimen will improve Outcome: Progressing

## 2023-01-10 NOTE — Group Note (Signed)
Recreation Therapy Group Note   Group Topic:Emotion Expression  Group Date: 01/10/2023 Start Time: 1010 End Time: 1053 Facilitators: Shreeya Recendiz-McCall, LRT,CTRS Location: [address] Dayroom   Goal Area(s) Addresses:  Patient will be able to identify a variety of emotions.  Patient will successfully share why it is beneficial to express emotions.   Group Description: Music Therapy. Patients were introduced to LRT and shared the group rules. Patients took turns choosing songs that had meaning to them and as long as the songs were clean and appropriate, LRT played them for each patient.   Affect/Mood: Labile   Participation Level: Moderate   Participation Quality: Moderate Cues   Behavior: Cooperative and Attention Seeking   Speech/Thought Process: Relevant   Insight: Fair   Judgement: Fair    Modes of Intervention: Music   Patient Response to Interventions:  Receptive   Education Outcome:  In group clarification offered    Clinical Observations/Individualized Feedback: Pt came into group late. Pt started off following directions and being appropriate in group. Pt then started following peer around, getting in his personal space and doing splits. Pt had to be removed from group.     Plan: Continue to engage patient in RT group sessions 2-3x/week.   Chemika Nightengale-McCall, LRT,CTRS 01/10/2023 11:12 AM

## 2023-01-10 NOTE — Progress Notes (Signed)
Patient has been monitored 1:1 this shift. Patient has ceased threatening behaviors and has been appropriately engaged with staff and has required minimal redirection. Patient denies SI, HI, and AVH.   Assess patient for safety, offer medications as prescribed, engage patient in 1:1 staff talks.   Patient able to contract for safety. Continue to monitor as planned.

## 2023-01-10 NOTE — Progress Notes (Signed)
Due to pt disruptive behavior without being redirectable , pt given PRN agitation protocol per [facility]

## 2023-01-11 DIAGNOSIS — F250 Schizoaffective disorder, bipolar type: Secondary | ICD-10-CM | POA: Diagnosis not present

## 2023-01-11 MED ORDER — CHLORPROMAZINE HCL 50 MG PO TABS
75.0000 mg | ORAL_TABLET | Freq: Three times a day (TID) | ORAL | Status: DC
  Administered 2023-01-11 – 2023-01-12 (×4): 75 mg via ORAL
  Filled 2023-01-11 (×10): qty 1

## 2023-01-11 NOTE — Progress Notes (Signed)
Nursing 1:1 note D:Pt observed sitting in bed with eyes open. RR even and unlabored. No distress noted. A: 1:1 observation continues for safety  R: pt remains safe  

## 2023-01-11 NOTE — Progress Notes (Signed)
Patient being monitored 1:1 without incident continue to monitor as planned.

## 2023-01-11 NOTE — Group Note (Signed)
Recreation Therapy Group Note   Group Topic:General Recreation  Group Date: 01/11/2023 Start Time: 1005 End Time: 1045 Facilitators: Doyle Kunath-McCall, LRT,CTRS Location: [address] Dayroom   Goal Area(s) Addresses:  Patient will use appropriate interactions in play with peers.   Patient will follow directions on first prompt.  Group Description: Trivia. LRT and patients participated in a game of trivia. Patients were to randomly answer the questions as they were presented to the group. LRT provided clues to the questions to if was going to make it a little easier for patients to guess the answers.      Affect/Mood: Flat   Participation Level: Moderate   Participation Quality: Independent   Behavior: Appropriate and Attentive    Speech/Thought Process: Coherent   Insight: Fair   Judgement: Fair    Modes of Intervention: Activity   Patient Response to Interventions:  Attentive and Receptive   Education Outcome:  In group clarification offered    Clinical Observations/Individualized Feedback: Pt was more attentive and appropriate during group session. Pt would ask clarifying questions during group to better understand the questions. Pt was calm and not disruptive to peers.    Plan: Continue to engage patient in RT group sessions 2-3x/week.   Azaliyah Kennard-McCall, LRT,CTRS 01/11/2023 11:23 AM

## 2023-01-11 NOTE — Progress Notes (Signed)
[facility] MD Progress Note  01/11/2023 4:36 PM Barbara George  MRN:  [id number]  Reason For Admission: Barbara George is a 27 year old African-American female with a past psychiatric history significant for schizoaffective disorder (bipolar type) who was admitted to [facility] from [facility], ED after presenting to the ED on 7/25 by her husband and father because she stopped eating, stopped talking, and became more withdrawn the day prior.  Per patient's father, history of nonmedication compliance and patient typically after discharge, which always leads to a relapse in her symptoms.  Patient was already receiving transferred back to [facility] under IVC for treatment and stabilization of her mental status.  24-hour chart review: Heart rate earlier today morning was documented as 150.  Prior to that it was documented as 124.  As per review of flow sheets, patient's heart rate typically does not run this time.  Assigned RN has been notified to recheck vital signs. STARR initiated earlier today morning at about 6 AM and as per the nursing documentation, she was throwing things in her room and not responding to verbal redirection.  Prior to that she was noted to have slept well overnight. One-on-one staff monitoring maintained overnight.  Patient assessment note:  During initial encounter with patient, she presented guarded, dysphoric, depressed.  She denied SI/HI, refused to engage in further communication with Clinical research associate.  She however was compliant to getting to her room to talk more, but refused to talk once in her room.  Patient was able to engage in assessment once attending psychiatrist entered the room.  She denied SI/HI/AVH.  She is at times, is illogical in her responses to questions, is non linear, when asked about her medications, she talks about her doctor passing away.  Patient also perseverates about "life is like a pancake".  She is asked to explain what this  entails, and states "If you don't flip flip slip, it's going to burn". She presents with some first rank symptoms, states that people know what she is thinking and can read her mind. She talks about being "the chosen one". She is however, in the context of her paranoia able to tell the place, time, and knows current president.  No TD/EPS type symptoms found on assessment, and pt denies any feelings of stiffness. AIMS: 0.   We are converting medications to p.o. so as to continue to gain patient's trust to take her medications.  She has agreed to take p.o. medications, and Has been educated to lock patient out of the room for at least 30 minutes after giving her medications, and also to make sure that mouth checks are completed at the med administration.  Writer attempted to call patient's husband to discuss what the patient's baseline of functioning looks like, but husband said it was not a good time to talk, and disconnected call.  Writer will attempt to make contact again tomorrow with husband.  Thorazine p.o. will be continued at 75 mg for psychosis along with other medications as listed below.  Principal Problem: Schizoaffective disorder, bipolar type (HCC) Diagnosis: Principal Problem:   Schizoaffective disorder, bipolar type (HCC) Active Problems:   Hyperlipidemia   Type II diabetes mellitus (HCC)   GAD (generalized anxiety disorder)  Total Time spent with patient: 35 minutes   Past Psychiatric History:  See H&P  Past Medical History:  Past Medical History:  Diagnosis Date   Bipolar 1 disorder (HCC)    Depression    Diabetes mellitus without complication (HCC)  Type 2 (Pre)   Schizophrenia (HCC)    History reviewed. No pertinent surgical history. Family History:  Family History  Problem Relation Age of Onset   Hypertension Father    Family Psychiatric  History:  See H&P  Social History:  Social History   Substance and Sexual Activity  Alcohol Use No   Alcohol/week: 0.0  standard drinks of alcohol     Social History   Substance and Sexual Activity  Drug Use No    Social History   Socioeconomic History   Marital status: Married    Spouse name: Barbara George   Number of children: Not on file   Years of education: Not on file   Highest education level: Associate degree: occupational, Scientist, product/process development, or vocational program  Occupational History   Occupation: unemployed  Tobacco Use   Smoking status: Never   Smokeless tobacco: Never  Vaping Use   Vaping status: Never Used  Substance and Sexual Activity   Alcohol use: No    Alcohol/week: 0.0 standard drinks of alcohol   Drug use: No   Sexual activity: Yes    Birth control/protection: None  Other Topics Concern   Not on file  Social History Narrative   ** Merged History Encounter **       Social Determinants of Health   Financial Resource Strain: Not on file  Food Insecurity: Patient Declined (12/28/2022)   Hunger Vital Sign    Worried About Running Out of Food in the Last Year: Patient declined    Ran Out of Food in the Last Year: Patient declined  Transportation Needs: Patient Declined (12/28/2022)   PRAPARE - Administrator, Civil Service (Medical): Patient declined    Lack of Transportation (Non-Medical): Patient declined  Physical Activity: Inactive (04/03/2019)   Exercise Vital Sign    Days of Exercise per Week: 0 days    Minutes of Exercise per Session: 0 min  Stress: Not on file  Social Connections: Socially Integrated (04/03/2019)   Social Connection and Isolation Panel [NHANES]    Frequency of Communication with Friends and Family: More than three times a week    Frequency of Social Gatherings with Friends and Family: Once a week    Attends Religious Services: More than 4 times per year    Active Member of [employer] or Organizations: Yes    Attends Engineer, structural: Not on file    Marital Status: Married   Sleep: Unable to assess  Appetite: Unable to  assess  Current Medications: Current Facility-Administered Medications  Medication Dose Route Frequency Provider Last Rate Last Admin   acetaminophen (TYLENOL) tablet 650 mg  650 mg Oral Q6H PRN Massengill, Harrold Donath, MD   650 mg at 01/06/23 1049   alum & mag hydroxide-simeth (MAALOX/MYLANTA) 200-200-20 MG/5ML suspension 30 mL  30 mL Oral Q4H PRN Massengill, Harrold Donath, MD       benztropine (COGENTIN) tablet 1 mg  1 mg Oral BID PRN Starleen Blue, NP       Or   benztropine mesylate (COGENTIN) injection 1 mg  1 mg Intramuscular BID PRN Starleen Blue, NP       chlorproMAZINE (THORAZINE) tablet 50 mg  50 mg Oral TID PRN Phineas Inches, MD   50 mg at 01/11/23 0600   chlorproMAZINE (THORAZINE) tablet 75 mg  75 mg Oral TID Starleen Blue, NP   75 mg at 01/11/23 1459   cloNIDine (CATAPRES) tablet 0.1 mg  0.1 mg Oral Q6H PRN Sarita Bottom, MD  diphenhydrAMINE (BENADRYL) capsule 50 mg  50 mg Oral TID PRN Phineas Inches, MD   50 mg at 01/11/23 0600   Or   diphenhydrAMINE (BENADRYL) injection 50 mg  50 mg Intramuscular TID PRN Massengill, Harrold Donath, MD   50 mg at 01/10/23 0321   hydrOXYzine (ATARAX) tablet 50 mg  50 mg Oral TID PRN Phineas Inches, MD   50 mg at 01/10/23 1734   LORazepam (ATIVAN) tablet 2 mg  2 mg Oral TID PRN Phineas Inches, MD   2 mg at 01/11/23 0600   Or   LORazepam (ATIVAN) injection 2 mg  2 mg Intramuscular TID PRN Phineas Inches, MD   2 mg at 01/10/23 0321   magnesium hydroxide (MILK OF MAGNESIA) suspension 30 mL  30 mL Oral Daily PRN Massengill, Harrold Donath, MD       nicotine polacrilex (NICORETTE) gum 2 mg  2 mg Oral PRN Starleen Blue, NP   2 mg at 01/08/23 1501   propranolol (INDERAL) tablet 10 mg  10 mg Oral BID Starleen Blue, NP   10 mg at 01/11/23 0615   simvastatin (ZOCOR) tablet 10 mg  10 mg Oral q1800 Starleen Blue, NP   10 mg at 01/10/23 1700   Vitamin D (Ergocalciferol) (DRISDOL) 1.25 MG (50000 UNIT) capsule 50,000 Units  50,000 Units Oral Q7 days Starleen Blue, NP   50,000 Units at 01/08/23 1016    Lab Results:  No results found for this or any previous visit (from the past 48 hour(s)).    Blood Alcohol level:  Lab Results  Component Value Date   ETH <10 12/27/2022   ETH <10 12/11/2022    Metabolic Disorder Labs: Lab Results  Component Value Date   HGBA1C 6.2 (H) 12/11/2022   MPG 131 12/11/2022   MPG 111.15 10/23/2018   Lab Results  Component Value Date   PROLACTIN 34.3 (H) 12/30/2022   PROLACTIN 13.0 12/11/2022   Lab Results  Component Value Date   CHOL 137 01/05/2023   TRIG 62 01/05/2023   HDL 62 01/05/2023   CHOLHDL 2.2 01/05/2023   VLDL 12 01/05/2023   LDLCALC 63 01/05/2023   LDLCALC 113 (H) 12/11/2022   Physical Findings: AIMS: 0Overall Severity Severity of abnormal movements (highest score from questions above): None, normal Incapacitation due to abnormal movements: None, normal Patient's awareness of abnormal movements (rate only patient's report): No Awareness, Dental Status Current problems with teeth and/or dentures?: No Does patient usually wear dentures?: No  CIWA:    COWS:     Musculoskeletal: Strength & Muscle Tone: within normal limits Gait & Station: normal Patient leans: N/A  Psychiatric Specialty Exam:  Presentation  General Appearance:  Fairly Groomed  Eye Contact: Fair  Speech: Other (comment) (disorganized)  Speech Volume: Decreased  Handedness: Right   Mood and Affect  Mood: Good  Affect: Labile, inappropriate  Thought Process  Thought Processes: Disorganized  Descriptions of Associations:Tangential  Orientation:Full (Time, Place and Person)  Thought Content:Illogical  History of Schizophrenia/Schizoaffective disorder:Yes  Duration of Psychotic Symptoms:Greater than six months  Hallucinations:Couldn't assess  Ideas of Reference:Paranoia; Delusions  Suicidal Thoughts:Suicidal Thoughts: No    Homicidal Thoughts:Homicidal Thoughts:  No    Sensorium  Memory: Couldn't assess  Judgment: Poor  Insight: Poor   Executive Functions  Concentration: Poor  Attention Span: Poor  Recall: Poor  Fund of Knowledge: Poor  Language: Fair   Psychomotor Activity  Psychomotor Activity: Psychomotor Activity: Normal    Assets  Assets: Social Support; Resilience  Sleep  Sleep: Sleep: Poor Number of Hours of Sleep: 7.5    Physical Exam: Constitutional:      Appearance: Normal appearance.  HENT:     Head: Normocephalic and atraumatic.  Eyes:     Extraocular Movements: Extraocular movements intact.  Cardiovascular:     Rate and Rhythm: Normal rate and regular rhythm.  Pulmonary:     Effort: Pulmonary effort is normal.   Musculoskeletal:        General: Normal range of motion.     Cervical back: Normal range of motion.  Skin:    General: Skin is warm and dry.   Review of Systems  Constitutional: Negative.   HENT: Negative.    Eyes: Negative.   Respiratory: Negative.    Cardiovascular: Negative.   Gastrointestinal: Negative.   Skin: Negative.   Neurological: Negative.  Blood pressure (!) 109/55, pulse (!) 150, temperature 98.2 F (36.8 C), temperature source Oral, resp. rate 18, height 5\' 8"  (1.727 m), weight 101.2 kg, SpO2 100%. Body mass index is 33.91 kg/m.   Treatment Plan Summary: Daily contact with patient to assess and evaluate symptoms and progress in treatment and Medication management  Plan:  Patient exhibited thought blocking and was mostly mute during most of the assessment.  When patient was able to communicate, patient provided bizarre responses to the questions addressed to her.  Patient to remain on her current psychiatric medication regimen.  Patient to continue to be evaluated and assessed to determine efficacy of treatment.  Safety and Monitoring: Voluntary admission to inpatient psychiatric unit for safety, stabilization and treatment Daily contact with patient  to assess and evaluate symptoms and progress in treatment Patient's case to be discussed in multi-disciplinary team meeting Observation Level : q15 minute checks Vital signs: q12 hours Precautions: Safety  Diagnosis: Principal Problem:   Schizoaffective disorder, bipolar type (HCC) Active Problems:   Hyperlipidemia   Type II diabetes mellitus (HCC)   GAD (generalized anxiety disorder)  #Schizoaffective disorder (bipolar type) -Continue Thorazine 75 mg TID for psychosis/mood stabilization & Switch from IM to Po. -Discontinued Haldol 10 mg TID on 8/4 due to lack of effectiveness -Previously discontinued risperidone 2 mg  BID as psychosis was persistent -Continue Cogentin to 1 mg BID PRN for EPS prophylaxis   Agitation protocol: -Diphenhydramine 50 mg p.o. or injection, 3 times daily as needed for agitation -Continue Thorazine 50 mg TID PRN for agitation - Previously Discontinued Haloperidol 5 mg p.o. or injection, 3 times daily as needed for agitation -Lorazepam 2 mg p.o. or injection, 3 times daily as needed for agitation  #Generalized anxiety disorder -Continue hydroxyzine 50 mg 3 times daily as needed for anxiety -Continue propranolol 10 mg 2 times daily for anxiety  #Hyperlipidemia -Continue simvastatin 10 mg daily  Insomnia -Continue trazodone 150 mg at bedtime for the management of insomnia  Hypertension -Continue clonidine 0.1 mg every 6 hours as needed for systolic blood pressure greater than 160 or diastolic blood pressure less greater than 100  Low Vitamin D level -Continue Vitamin D 50.000 units weekly  As needed medications: -Patient to continue taking Tylenol 650 mg every 6 hours as needed for mild pain -Patient to continue taking Maalox/Mylanta 30 mL every 4 hours as needed for indigestion -Patient to continue taking Milk of Magnesia 30 mL as needed for mild constipation  Labs reviewed: Ammonia level was found to be within normal limits (<10 umol/L).   Vitamin D level was found to be decreased (20.09 ng/mL), supple.menting.  Prolactin  level slightly elevated at 34. [name]  Discharge Planning: Social work and case management to assist with discharge planning and identification of hospital follow-up needs prior to discharge Estimated LOS: 5-7 days Discharge Concerns: Need to establish a safety plan; Medication compliance and effectiveness Discharge Goals: Return home with outpatient referrals for mental health follow-up including medication management/psychotherapy   I certify that inpatient services furnished can reasonably be expected to improve the patient's condition.    Starleen Blue, NP  Patient ID: Barbara George, female   DOB: Jun 24, 1995, 27 y.o.   MRN: [id number]

## 2023-01-11 NOTE — Progress Notes (Signed)
Nursing 1:1 note D:Pt observed sleeping in bed with eyes closed. RR even and unlabored. No distress noted. A: 1:1 observation continues for safety  R: pt remains safe  

## 2023-01-11 NOTE — Progress Notes (Addendum)
Nursing 1:1 note D:Pt observed pushing the STARR button, pt throwing things around in her room, pt not responding to redirection. RR even and unlabored. No distress noted. A: 1:1 observation continues for safety . Pt given PO agitation protocol per [facility] R: pt remains safe

## 2023-01-11 NOTE — Progress Notes (Signed)
Patient being monitored 1:1. Patient denies SI, HI, and AVH. Patient has been able to follow staff direction and has exhibited no behavioral dyscontrol this shift.   Assess patient for safety, offer medications as prescribed, engage patient in 1:1 staff talks.   Patient able to contract for safety. Continue to monitor as planned.

## 2023-01-11 NOTE — [facility] Group Notes (Signed)
Spirituality group facilitated by Chaplain Matthew Stalnaker, MDiv, B[facility].  Group Description:  Group focused on topic of hope.  Patients participated in facilitated discussion around topic, connecting with one another around experiences and definitions for hope.  Group members engaged with visual explorer photos, reflecting on what hope looks like for them today.  Group engaged in discussion around how their definitions of hope are present today in hospital.   Modalities: Psycho-social ed, Adlerian, Narrative, MI Patient Progress: DID NOT ATTEND  
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
## 2023-01-11 NOTE — Progress Notes (Signed)
   01/11/23 2015  Psych Admission Type (Psych Patients Only)  Admission Status Involuntary  Psychosocial Assessment  Patient Complaints Anxiety  Eye Contact Staring  Facial Expression Anxious;Animated  Affect Labile  Orthoptist Attention-seeking;Childlike;Sarcastic  Motor Activity Restless;Fidgety  Appearance/Hygiene Layered clothes  Behavior Characteristics Cooperative  Mood Anxious;Suspicious;Preoccupied;Pleasant  Aggressive Behavior  Effect No apparent injury  Thought Process  Coherency Circumstantial;Disorganized  Content Preoccupation  Delusions WDL  Perception WDL  Hallucination None reported or observed  Judgment Poor  Confusion WDL  Danger to Self  Current suicidal ideation? Denies  Danger to Others  Danger to Others None reported or observed

## 2023-01-11 NOTE — Plan of Care (Signed)
  Problem: Education: Goal: Emotional status will improve Outcome: Progressing Goal: Mental status will improve Outcome: Progressing   Problem: Activity: Goal: Interest or engagement in activities will improve Outcome: Progressing Goal: Sleeping patterns will improve Outcome: Progressing   Problem: Safety: Goal: Periods of time without injury will increase Outcome: Progressing   

## 2023-01-12 DIAGNOSIS — F250 Schizoaffective disorder, bipolar type: Secondary | ICD-10-CM | POA: Diagnosis not present

## 2023-01-12 MED ORDER — CHLORPROMAZINE HCL 100 MG PO TABS
100.0000 mg | ORAL_TABLET | Freq: Three times a day (TID) | ORAL | Status: DC
  Administered 2023-01-12 – 2023-01-18 (×17): 100 mg via ORAL
  Filled 2023-01-12 (×17): qty 1
  Filled 2023-01-12: qty 4
  Filled 2023-01-12 (×6): qty 1

## 2023-01-12 NOTE — Progress Notes (Cosign Needed Addendum)
[facility] MD Progress Note  01/12/2023 4:58 PM Barbara George  MRN:  [id number]  Reason For Admission: Barbara George is a 27 year old African-American female with a past psychiatric history significant for schizoaffective disorder (bipolar type) who was admitted to [facility] from [facility], ED after presenting to the ED on 7/25 by her husband and father because she stopped eating, stopped talking, and became more withdrawn the day prior.  Per patient's father, history of nonmedication compliance and patient typically after discharge, which always leads to a relapse in her symptoms.  Patient was already receiving transferred back to [facility] under IVC for treatment and stabilization of her mental status.  24-hour chart review: Vital signs WNL, patient remains on one-on-one staff monitoring as she is continuing to require continuous redirections from staff. She is compliant with medications, had agitation protocol medications last after midnight. As per nursing documentation, patient was disruptive, not receptive to verbal redirections, and required agitation protocol medications to be able to stay calm.   Patient assessment note:  During encounter today, patient is agitated, sticks her fingers out to writer's face, tells writer to take all the medications that are being prescribed to her, and Nurse, children's of testing medications on her. Mood remains dysphoric, irritable, and anxious today.  She presents with disorganized & illogical thoughts and is non receptive to being assessed today. She is however able to nod "no" to questions regarding SI/HI/AVH.  As per nursing staff, slept through the night after receiving agitation protocol medications, and mood has been labile through entire day.   Writer spoke with pt's husband at [employer] (Spouse) [phone] [facility]). Husband was able to verify pt by her DOB prior to telephone encounter. Husband states that patient  has not yet returned to her baseline of functioning. Writer inquired about a family meeting with him and pt's father and also the attending psychiatrist tomorrow at 3pm, and husband was receptive and stated that he would come to [facility] at the above time for the family meeting. Pt['s husband states that he would inform the pt's father so that he can also be in attendance as Clinical research associate made multiple attempts to call father an phone number listed on chart with no success.   We are continuing Thorazine and increasing this medication to 100 mg TID for mood stabilization and psychosis. Ordering repeat EKG with upward titration of antipsychotic medication. No TD/EPS type symptoms found on assessment, and pt denies any feelings of stiffness. AIMS: 0.   Principal Problem: Schizoaffective disorder, bipolar type (HCC) Diagnosis: Principal Problem:   Schizoaffective disorder, bipolar type (HCC) Active Problems:   Hyperlipidemia   Type II diabetes mellitus (HCC)   GAD (generalized anxiety disorder)  Total Time spent with patient: 35 minutes   Past Psychiatric History:  See H&P  Past Medical History:  Past Medical History:  Diagnosis Date   Bipolar 1 disorder (HCC)    Depression    Diabetes mellitus without complication (HCC)    Type 2 (Pre)   Schizophrenia (HCC)    History reviewed. No pertinent surgical history. Family History:  Family History  Problem Relation Age of Onset   Hypertension Father    Family Psychiatric  History:  See H&P  Social History:  Social History   Substance and Sexual Activity  Alcohol Use No   Alcohol/week: 0.0 standard drinks of alcohol     Social History   Substance and Sexual Activity  Drug Use No    Social History  Socioeconomic History   Marital status: Married    Spouse name: Barbara George   Number of children: Not on file   Years of education: Not on file   Highest education level: Associate degree: occupational, Scientist, product/process development, or vocational program   Occupational History   Occupation: unemployed  Tobacco Use   Smoking status: Never   Smokeless tobacco: Never  Vaping Use   Vaping status: Never Used  Substance and Sexual Activity   Alcohol use: No    Alcohol/week: 0.0 standard drinks of alcohol   Drug use: No   Sexual activity: Yes    Birth control/protection: None  Other Topics Concern   Not on file  Social History Narrative   ** Merged History Encounter **       Social Determinants of Health   Financial Resource Strain: Not on file  Food Insecurity: Patient Declined (12/28/2022)   Hunger Vital Sign    Worried About Running Out of Food in the Last Year: Patient declined    Ran Out of Food in the Last Year: Patient declined  Transportation Needs: Patient Declined (12/28/2022)   PRAPARE - Administrator, Civil Service (Medical): Patient declined    Lack of Transportation (Non-Medical): Patient declined  Physical Activity: Inactive (04/03/2019)   Exercise Vital Sign    Days of Exercise per Week: 0 days    Minutes of Exercise per Session: 0 min  Stress: Not on file  Social Connections: Socially Integrated (04/03/2019)   Social Connection and Isolation Panel [NHANES]    Frequency of Communication with Friends and Family: More than three times a week    Frequency of Social Gatherings with Friends and Family: Once a week    Attends Religious Services: More than 4 times per year    Active Member of [employer] or Organizations: Yes    Attends Engineer, structural: Not on file    Marital Status: Married   Sleep: Unable to assess  Appetite: Unable to assess  Current Medications: Current Facility-Administered Medications  Medication Dose Route Frequency Provider Last Rate Last Admin   acetaminophen (TYLENOL) tablet 650 mg  650 mg Oral Q6H PRN Massengill, Harrold Donath, MD   650 mg at 01/06/23 1049   alum & mag hydroxide-simeth (MAALOX/MYLANTA) 200-200-20 MG/5ML suspension 30 mL  30 mL Oral Q4H PRN Massengill,  Harrold Donath, MD       benztropine (COGENTIN) tablet 1 mg  1 mg Oral BID PRN Starleen Blue, NP       Or   benztropine mesylate (COGENTIN) injection 1 mg  1 mg Intramuscular BID PRN Starleen Blue, NP       chlorproMAZINE (THORAZINE) tablet 100 mg  100 mg Oral TID Starleen Blue, NP       chlorproMAZINE (THORAZINE) tablet 50 mg  50 mg Oral TID PRN Phineas Inches, MD   50 mg at 01/11/23 0600   cloNIDine (CATAPRES) tablet 0.1 mg  0.1 mg Oral Q6H PRN Abbott Pao, Nadir, MD       diphenhydrAMINE (BENADRYL) capsule 50 mg  50 mg Oral TID PRN Phineas Inches, MD   50 mg at 01/12/23 0042   Or   diphenhydrAMINE (BENADRYL) injection 50 mg  50 mg Intramuscular TID PRN Phineas Inches, MD   50 mg at 01/10/23 0321   hydrOXYzine (ATARAX) tablet 50 mg  50 mg Oral TID PRN Phineas Inches, MD   50 mg at 01/12/23 1658   LORazepam (ATIVAN) tablet 2 mg  2 mg Oral TID PRN Phineas Inches, MD  2 mg at 01/12/23 4782   Or   LORazepam (ATIVAN) injection 2 mg  2 mg Intramuscular TID PRN Phineas Inches, MD   2 mg at 01/10/23 0321   magnesium hydroxide (MILK OF MAGNESIA) suspension 30 mL  30 mL Oral Daily PRN Massengill, Harrold Donath, MD       nicotine polacrilex (NICORETTE) gum 2 mg  2 mg Oral PRN Starleen Blue, NP   2 mg at 01/08/23 1501   propranolol (INDERAL) tablet 10 mg  10 mg Oral BID Starleen Blue, NP   10 mg at 01/12/23 9562   simvastatin (ZOCOR) tablet 10 mg  10 mg Oral q1800 Starleen Blue, NP   10 mg at 01/11/23 1816   Vitamin D (Ergocalciferol) (DRISDOL) 1.25 MG (50000 UNIT) capsule 50,000 Units  50,000 Units Oral Q7 days Starleen Blue, NP   50,000 Units at 01/08/23 1016    Lab Results:  No results found for this or any previous visit (from the past 48 hour(s)).    Blood Alcohol level:  Lab Results  Component Value Date   ETH <10 12/27/2022   ETH <10 12/11/2022    Metabolic Disorder Labs: Lab Results  Component Value Date   HGBA1C 6.2 (H) 12/11/2022   MPG 131 12/11/2022   MPG 111.15  10/23/2018   Lab Results  Component Value Date   PROLACTIN 34.3 (H) 12/30/2022   PROLACTIN 13.0 12/11/2022   Lab Results  Component Value Date   CHOL 137 01/05/2023   TRIG 62 01/05/2023   HDL 62 01/05/2023   CHOLHDL 2.2 01/05/2023   VLDL 12 01/05/2023   LDLCALC 63 01/05/2023   LDLCALC 113 (H) 12/11/2022   Physical Findings: AIMS: 0Overall Severity Severity of abnormal movements (highest score from questions above): None, normal Incapacitation due to abnormal movements: None, normal Patient's awareness of abnormal movements (rate only patient's report): No Awareness, Dental Status Current problems with teeth and/or dentures?: No Does patient usually wear dentures?: No  CIWA:    COWS:     Musculoskeletal: Strength & Muscle Tone: within normal limits Gait & Station: normal Patient leans: N/A  Psychiatric Specialty Exam:  Presentation  General Appearance:  Casual; Fairly Groomed  Eye Contact: Fair  Speech: Pressured  Speech Volume: Normal  Handedness: Right   Mood and Affect  Mood: Good  Affect: Labile, inappropriate  Thought Process  Thought Processes: Disorganized  Descriptions of Associations:Intact  Orientation:Partial  Thought Content:Illogical  History of Schizophrenia/Schizoaffective disorder:Yes  Duration of Psychotic Symptoms:Greater than six months  Hallucinations:Couldn't assess  Ideas of Reference:None  Suicidal Thoughts:Suicidal Thoughts: No    Homicidal Thoughts:Homicidal Thoughts: No    Sensorium  Memory: Couldn't assess  Judgment: Poor  Insight: Poor   Executive Functions  Concentration: Poor  Attention Span: Poor  Recall: Poor  Fund of Knowledge: Poor  Language: Poor   Psychomotor Activity  Psychomotor Activity: Psychomotor Activity: Normal    Assets  Assets: Resilience   Sleep  Sleep: Sleep: Fair    Physical Exam: Constitutional:      Appearance: Normal appearance.  HENT:      Head: Normocephalic and atraumatic.  Eyes:     Extraocular Movements: Extraocular movements intact.  Cardiovascular:     Rate and Rhythm: Normal rate and regular rhythm.  Pulmonary:     Effort: Pulmonary effort is normal.   Musculoskeletal:        General: Normal range of motion.     Cervical back: Normal range of motion.  Skin:    General:  Skin is warm and dry.   Review of Systems  Constitutional: Negative.   HENT: Negative.    Eyes: Negative.   Respiratory: Negative.    Cardiovascular: Negative.   Gastrointestinal: Negative.   Skin: Negative.   Neurological: Negative.  Blood pressure 116/83, pulse 100, temperature 98.8 F (37.1 C), temperature source Oral, resp. rate 18, height 5\' 8"  (1.727 m), weight 101.2 kg, SpO2 100%. Body mass index is 33.91 kg/m.   Treatment Plan Summary: Daily contact with patient to assess and evaluate symptoms and progress in treatment and Medication management  Plan:  Patient exhibited thought blocking and was mostly mute during most of the assessment.  When patient was able to communicate, patient provided bizarre responses to the questions addressed to her.  Patient to remain on her current psychiatric medication regimen.  Patient to continue to be evaluated and assessed to determine efficacy of treatment.  Safety and Monitoring: Voluntary admission to inpatient psychiatric unit for safety, stabilization and treatment Daily contact with patient to assess and evaluate symptoms and progress in treatment Patient's case to be discussed in multi-disciplinary team meeting Observation Level : q15 minute checks Vital signs: q12 hours Precautions: Safety  Diagnosis: Principal Problem:   Schizoaffective disorder, bipolar type (HCC) Active Problems:   Hyperlipidemia   Type II diabetes mellitus (HCC)   GAD (generalized anxiety disorder)  #Schizoaffective disorder (bipolar type) -Increase Thorazine to 100 mg PO TID for psychosis/mood  stabilization  -Discontinued Haldol 10 mg TID on 8/4 due to lack of effectiveness -Previously discontinued risperidone 2 mg  BID as psychosis was persistent -Continue Cogentin to 1 mg BID PRN for EPS prophylaxis   Agitation protocol: -Diphenhydramine 50 mg p.o. or injection, 3 times daily as needed for agitation -Continue Thorazine 50 mg TID PRN for agitation - Previously Discontinued Haloperidol 5 mg p.o. or injection, 3 times daily as needed for agitation -Lorazepam 2 mg p.o. or injection, 3 times daily as needed for agitation  #Generalized anxiety disorder -Continue hydroxyzine 50 mg 3 times daily as needed for anxiety -Continue propranolol 10 mg 2 times daily for anxiety  #Hyperlipidemia -Continue simvastatin 10 mg daily  Insomnia -Continue trazodone 150 mg at bedtime for the management of insomnia  Hypertension -Continue clonidine 0.1 mg every 6 hours as needed for systolic blood pressure greater than 160 or diastolic blood pressure less greater than 100  Low Vitamin D level -Continue Vitamin D 50.000 units weekly  As needed medications: -Patient to continue taking Tylenol 650 mg every 6 hours as needed for mild pain -Patient to continue taking Maalox/Mylanta 30 mL every 4 hours as needed for indigestion -Patient to continue taking Milk of Magnesia 30 mL as needed for mild constipation  Labs reviewed: Ammonia level was found to be within normal limits (<10 umol/L).  Vitamin D level was found to be decreased (20.09 ng/mL), supple.menting.  Prolactin level slightly elevated at 34. [name]  Discharge Planning: Social work and case management to assist with discharge planning and identification of hospital follow-up needs prior to discharge Estimated LOS: 5-7 days Discharge Concerns: Need to establish a safety plan; Medication compliance and effectiveness Discharge Goals: Return home with outpatient referrals for mental health follow-up including medication management/psychotherapy    I certify that inpatient services furnished can reasonably be expected to improve the patient's condition.    Starleen Blue, NP  Patient ID: Barbara George, female   DOB: Aug 26, 1995, 27 y.o.   MRN: [id number]

## 2023-01-12 NOTE — Progress Notes (Signed)
Pt is A&OX4, calm, denies suicidal ideations, denies homicidal ideations, denies auditory hallucinations and denies visual hallucinations. Pt verbally agrees to approach staff if these become apparent and before harming self or others. Medication compliant, attention seeking behaviors. Pt stood in chair twice during this shift requiring staff to direct her to sit down. Selectively mute this morning. Pt denies experiencing nightmares. Mood and affect are congruent. Pt appetite is ok. No complaints of anxiety, distress, pain and/or discomfort at this time. Pt's memory appears to be grossly intact, and Pt hasn't displayed any injurious behaviors. There's no evidence of suicidal intent. Psychomotor activity was WNL. No s/s of Parkinson, Dystonia, Akathisia and/or Tardive Dyskinesia noted.

## 2023-01-12 NOTE — Progress Notes (Signed)
Nursing 1:1 note D:Pt observed sleeping in bed with eyes closed. RR even and unlabored. No distress noted. A: 1:1 observation continues for safety  R: pt remains safe  

## 2023-01-12 NOTE — Progress Notes (Signed)
1:1 Nursing note - Patient's husband visited her on tonight. Writer attempted to speak with patient but she chose not to talk with Clinical research associate. She took her hs medications and threw her medication cup on the floor in the med room when she finished and turned and walked off. 1:1 continues with sitter by patients side.

## 2023-01-12 NOTE — [facility] Group Notes (Signed)
[facility] Group Notes:  (Nursing/MHT/Case Management/Adjunct)  Date:  01/12/2023  Time:  8:41 PM  Type of Therapy:  The focus of this group is to help patients review their daily goal of treatment and discuss progress on daily workbooks.   Participation Level:  Active  Participation Quality:  Appropriate  Affect:  Appropriate  Cognitive:  Alert and Appropriate  Insight:  Appropriate  Engagement in Group:  Lacking  Modes of Intervention:  Discussion  Summary of Progress/Problems: Pt attended group and was asked " What is your favorite childhood memory?" Pt replied " white rabbit, I got to find my white rabbit but not near the cow because that will make a bigggg mess. Okay thank you for asking I'm done". Pt did lack focus with the direction of questions but was able to remain quiet and calm while others was sharing in group.   Granville Lewis 01/12/2023, 8:41 PM

## 2023-01-12 NOTE — Progress Notes (Addendum)
1:1 DOCUMENTATION: Pt observed in dayroom and/or pacing hallway. Remains labile and is now verbal. Remains on 1:1 observation and remains safe. No c/o pain and/or discomfort at this time. Verbalized her desire to be discharged. Verbalized her discontentment

## 2023-01-12 NOTE — Progress Notes (Signed)
1:1 DOCUMENTATION: Pt observed in dayroom watching television, and pacing hallway. Remains selectively mute and labile. Remains on 1:1 observation and remains safe. No c/o pain and/or discomfort at this time.

## 2023-01-12 NOTE — Progress Notes (Signed)
Pt back asleep after getting PRN agitation protocol

## 2023-01-12 NOTE — Progress Notes (Signed)
   01/12/23 2100  Psych Admission Type (Psych Patients Only)  Admission Status Involuntary  Psychosocial Assessment  Patient Complaints Anxiety  Eye Contact Staring  Facial Expression Flat  Affect Labile  Speech Elective mutism  Interaction No initiation  Motor Activity Slow  Appearance/Hygiene Layered clothes  Behavior Characteristics Appropriate to situation  Mood Labile;Preoccupied  Thought Process  Coherency Disorganized  Content Preoccupation  Delusions None reported or observed  Perception WDL  Hallucination None reported or observed  Judgment Poor  Confusion None  Danger to Self  Current suicidal ideation? Denies

## 2023-01-12 NOTE — Progress Notes (Signed)
Pt up being disruptive, pt not able to be redirected by 1:1 sitter , pt given PRN Benadryl and Ativan per [facility]

## 2023-01-12 NOTE — Progress Notes (Signed)
1:1 DOCUMENTATION: Pt observed in dayroom watching television, and pacing hallway. Remains labile. Remains on 1:1 observation and remains safe. No c/o pain and/or discomfort at this time.

## 2023-01-13 DIAGNOSIS — F250 Schizoaffective disorder, bipolar type: Secondary | ICD-10-CM | POA: Diagnosis not present

## 2023-01-13 MED ORDER — PROPRANOLOL HCL 10 MG PO TABS
10.0000 mg | ORAL_TABLET | Freq: Three times a day (TID) | ORAL | Status: DC
Start: 2023-01-13 — End: 2023-01-13

## 2023-01-13 MED ORDER — OLANZAPINE 5 MG PO TBDP
5.0000 mg | ORAL_TABLET | Freq: Two times a day (BID) | ORAL | Status: DC
  Administered 2023-01-13 – 2023-01-14 (×2): 5 mg via ORAL
  Filled 2023-01-13 (×7): qty 1

## 2023-01-13 MED ORDER — PROPRANOLOL HCL 10 MG PO TABS
10.0000 mg | ORAL_TABLET | Freq: Two times a day (BID) | ORAL | Status: DC
  Administered 2023-01-13 – 2023-01-17 (×8): 10 mg via ORAL
  Filled 2023-01-13 (×15): qty 1

## 2023-01-13 NOTE — [facility] Group Notes (Signed)
Pt attended wrap up group but refused to participate

## 2023-01-13 NOTE — Progress Notes (Signed)
1:1 Nursing note - Patient is asleep with no distress noted, 1:1 continues with sitter in room.

## 2023-01-13 NOTE — Progress Notes (Signed)
   01/13/23 1100  Psych Admission Type (Psych Patients Only)  Admission Status Involuntary  Psychosocial Assessment  Patient Complaints Anxiety  Eye Contact Staring  Facial Expression Flat  Affect Labile  Speech Elective mutism  Interaction Forwards little  Motor Activity Slow  Appearance/Hygiene Layered clothes  Behavior Characteristics Appropriate to situation  Mood Preoccupied  Thought Process  Coherency Disorganized  Content Preoccupation  Delusions None reported or observed  Perception WDL  Hallucination None reported or observed  Judgment Poor  Confusion None  Danger to Self  Current suicidal ideation? Denies  Self-Injurious Behavior No self-injurious ideation or behavior indicators observed or expressed   Danger to Others  Danger to Others None reported or observed

## 2023-01-13 NOTE — Progress Notes (Signed)
Patient ID: Barbara George, female   DOB: 1996/04/04, 27 y.o.   MRN: [id number]  Pt ambulating in the dayroom and watching television. Pt also talking with other pts. 1:1 continues for pt's safety. Pt's safety remains on unit.

## 2023-01-13 NOTE — Progress Notes (Signed)
[facility] MD Progress Note  01/13/2023 4:36 PM Barbara George  MRN:  [id number]  Reason For Admission: Barbara George is a 27 year old African-American female with a past psychiatric history significant for schizoaffective disorder (bipolar type) who was admitted to [facility] from [facility], ED after presenting to the ED on 7/25 by her husband and father because she stopped eating, stopped talking, and became more withdrawn the day prior.  Per patient's father, history of nonmedication compliance and patient typically after discharge, which always leads to a relapse in her symptoms.  Patient was already receiving transferred back to [facility] under [facility]C for treatment and stabilization of her mental status.  24-hour chart review: Vital signs WNL with exception of some elevations in heart rate earlier today morning. patient remains on one-on-one staff monitoring as she is continuing to require continuous redirections from staff. She remains intrusive especially with female patients of the unit and remains sexually inappropriate. Patient has been receiving agitation protocol medications daily for the past 3 days.  Patient assessment note:  Today, patient initially refused to talk to writer, but was more receptive when assessed along with attending psychiatrist.  Patient was linear at start of encounter, was coherent, provided anxiety to questions logically, which indicates that psychosis is somewhat improving.  For example when asked about her visitations, and who has been visiting her, she stated that only 1 person has been visiting, and that person is her husband, because hospital policy allows only one visitor at a time.  Patient denies SI/HI, she denies AVH, and then started becoming gamy and not wanting to certain questions when attending psychiatrist asked to her; patient was asked how long she has been married for and answered: "That is a good question".  She was asked how  her relationship with her father is, and answered: "Perfect".  She was asked if her mother is alive, and answered: "I don't know". She was asked how long she has been residing in the [place] and answered: "10 yrs".  She was asked if her medications are helping her feel better, and answered: "God is helping me get better. Not all science is good science for the body".  We provided patient with education on continuing to take her medications with a goal of continued betterment of her mental health.  She reported a fair sleep quality last night, reported a good appetite, denied medication related side effects.  We educated her on the fact that we would add Zyprexa to her medication regimen at 5 mg twice daily to augment current antipsychotic medication (Thorazine).  No TD/EPS type symptoms found on assessment, and pt denies any feelings of stiffness. AIMS: 0.  EKG done yesterday, but repeating it tomorrow with the addition of a second antipsychotic medication.   Family meeting: In attendance with her husband, (adamou,amadou), father Cephus Shelling), patient, patient's 2 brothers, Clinical research associate and attending psychiatrist.  Meeting was held in the conference room of the hospital at 3 PM and lasted for an hour.  Meeting started by having patient's assigned RN taking patient to the conference room to meet with her family in the absence of the providers.  Providers went into the meeting 10 minutes later, introduced themselves, and asked patient to identify her family members, and she was only able to identify her husband who was sitting across from her.  As per patient's family members, patient was being gamy, as she was previously able to recognize her family members, and engage in conversations with them,  without the providers being in the room.  Patient's assigned RN took patient back to the unit, and patient's family met with providers for the rest of the meeting.  Attending psychiatrist explained to the family that during  encounter earlier in the day, patient had been linear and coherent, and asked family members if they thought patient was getting better than prior to hospitalization.  All of patient's family members agreed that current presentation is an improvement as compared to her prior to admission presentation.  Patient's brother and father stated that when patient was discharged from the hospital the first time, she was restless, and was not sleeping.  Father states trazodone was ineffective for her, and that during prior hospitalizations, olanzapine has been helpful in keeping patient stabilized for a very long time.  Based on family's input, it seems like Abilify during last encounter, caused patient to be akathetic after she was discharged home.  Attending psychiatrist educated family that patient is currently on Thorazine, and we are looking to adding a second antipsychotic which is the olanzapine today, as patient's psychosis has remained persistent with the trials of 1 antipsychotic medication at a time just by themselves.  The family agrees that even though patient can be playful and gamy, has psychosis is still persistent, and she is not ready for discharge at this time.  Father adds that sometimes it takes up to 6 months to get patient stabilized where she is able to return to her baseline of functioning.  Father states that her thought process is currently still not organized.    Patient's brothers verbalized concerns about keeping patient occupied after discharge, and providers discussed the possibility of PHP prior to discharge.   We will bring this to the attention of the treatment team and patient's CSW tomorrow, and also asked for a referral to the assertive community treatment team for patient since she has had recurrent hospitalizations in the past couple of months.  Principal Problem: Schizoaffective disorder, bipolar type (HCC) Diagnosis: Principal Problem:   Schizoaffective disorder, bipolar type  (HCC) Active Problems:   Hyperlipidemia   Type II diabetes mellitus (HCC)   GAD (generalized anxiety disorder)  Total Time spent with patient: 35 minutes   Past Psychiatric History:  See H&P  Past Medical History:  Past Medical History:  Diagnosis Date   Bipolar 1 disorder (HCC)    Depression    Diabetes mellitus without complication (HCC)    Type 2 (Pre)   Schizophrenia (HCC)    History reviewed. No pertinent surgical history. Family History:  Family History  Problem Relation Age of Onset   Hypertension Father    Family Psychiatric  History:  See H&P  Social History:  Social History   Substance and Sexual Activity  Alcohol Use No   Alcohol/week: 0.0 standard drinks of alcohol     Social History   Substance and Sexual Activity  Drug Use No    Social History   Socioeconomic History   Marital status: Married    Spouse name: Amadou   Number of children: Not on file   Years of education: Not on file   Highest education level: Associate degree: occupational, Scientist, product/process development, or vocational program  Occupational History   Occupation: unemployed  Tobacco Use   Smoking status: Never   Smokeless tobacco: Never  Vaping Use   Vaping status: Never Used  Substance and Sexual Activity   Alcohol use: No    Alcohol/week: 0.0 standard drinks of alcohol   Drug use:  No   Sexual activity: Yes    Birth control/protection: None  Other Topics Concern   Not on file  Social History Narrative   ** Merged History Encounter **       Social Determinants of Health   Financial Resource Strain: Not on file  Food Insecurity: Patient Declined (12/28/2022)   Hunger Vital Sign    Worried About Running Out of Food in the Last Year: Patient declined    Ran Out of Food in the Last Year: Patient declined  Transportation Needs: Patient Declined (12/28/2022)   PRAPARE - Administrator, Civil Service (Medical): Patient declined    Lack of Transportation (Non-Medical): Patient  declined  Physical Activity: Inactive (04/03/2019)   Exercise Vital Sign    Days of Exercise per Week: 0 days    Minutes of Exercise per Session: 0 min  Stress: Not on file  Social Connections: Socially Integrated (04/03/2019)   Social Connection and Isolation Panel [NHANES]    Frequency of Communication with Friends and Family: More than three times a week    Frequency of Social Gatherings with Friends and Family: Once a week    Attends Religious Services: More than 4 times per year    Active Member of [employer] or Organizations: Yes    Attends Engineer, structural: Not on file    Marital Status: Married   Sleep: Unable to assess  Appetite: Unable to assess  Current Medications: Current Facility-Administered Medications  Medication Dose Route Frequency Provider Last Rate Last Admin   acetaminophen (TYLENOL) tablet 650 mg  650 mg Oral Q6H PRN Massengill, Harrold Donath, MD   650 mg at 01/06/23 1049   alum & mag hydroxide-simeth (MAALOX/MYLANTA) 200-200-20 MG/5ML suspension 30 mL  30 mL Oral Q4H PRN Massengill, Harrold Donath, MD       benztropine (COGENTIN) tablet 1 mg  1 mg Oral BID PRN Starleen Blue, NP       Or   benztropine mesylate (COGENTIN) injection 1 mg  1 mg Intramuscular BID PRN Starleen Blue, NP       chlorproMAZINE (THORAZINE) tablet 100 mg  100 mg Oral TID Starleen Blue, NP   100 mg at 01/13/23 1317   chlorproMAZINE (THORAZINE) tablet 50 mg  50 mg Oral TID PRN Phineas Inches, MD   50 mg at 01/11/23 0600   cloNIDine (CATAPRES) tablet 0.1 mg  0.1 mg Oral Q6H PRN Abbott Pao, Nadir, MD       diphenhydrAMINE (BENADRYL) capsule 50 mg  50 mg Oral TID PRN Phineas Inches, MD   50 mg at 01/12/23 8416   Or   diphenhydrAMINE (BENADRYL) injection 50 mg  50 mg Intramuscular TID PRN Phineas Inches, MD   50 mg at 01/13/23 0515   hydrOXYzine (ATARAX) tablet 50 mg  50 mg Oral TID PRN Phineas Inches, MD   50 mg at 01/12/23 2047   LORazepam (ATIVAN) tablet 2 mg  2 mg Oral TID PRN  Phineas Inches, MD   2 mg at 01/12/23 6063   Or   LORazepam (ATIVAN) injection 2 mg  2 mg Intramuscular TID PRN Massengill, Harrold Donath, MD   2 mg at 01/13/23 0515   magnesium hydroxide (MILK OF MAGNESIA) suspension 30 mL  30 mL Oral Daily PRN Massengill, Harrold Donath, MD       nicotine polacrilex (NICORETTE) gum 2 mg  2 mg Oral PRN Starleen Blue, NP   2 mg at 01/08/23 1501   OLANZapine zydis (ZYPREXA) disintegrating tablet 5 mg  5  mg Oral BID Starleen Blue, NP       propranolol (INDERAL) tablet 10 mg  10 mg Oral BID Starleen Blue, NP       simvastatin (ZOCOR) tablet 10 mg  10 mg Oral q1800 Starleen Blue, NP   10 mg at 01/12/23 1705   Vitamin D (Ergocalciferol) (DRISDOL) 1.25 MG (50000 UNIT) capsule 50,000 Units  50,000 Units Oral Q7 days Starleen Blue, NP   50,000 Units at 01/08/23 1016    Lab Results:  No results found for this or any previous visit (from the past 48 hour(s)).    Blood Alcohol level:  Lab Results  Component Value Date   ETH <10 12/27/2022   ETH <10 12/11/2022    Metabolic Disorder Labs: Lab Results  Component Value Date   HGBA1C 6.2 (H) 12/11/2022   MPG 131 12/11/2022   MPG 111.15 10/23/2018   Lab Results  Component Value Date   PROLACTIN 34.3 (H) 12/30/2022   PROLACTIN 13.0 12/11/2022   Lab Results  Component Value Date   CHOL 137 01/05/2023   TRIG 62 01/05/2023   HDL 62 01/05/2023   CHOLHDL 2.2 01/05/2023   VLDL 12 01/05/2023   LDLCALC 63 01/05/2023   LDLCALC 113 (H) 12/11/2022   Physical Findings: AIMS: 0Overall Severity Severity of abnormal movements (highest score from questions above): None, normal Incapacitation due to abnormal movements: None, normal Patient's awareness of abnormal movements (rate only patient's report): No Awareness, Dental Status Current problems with teeth and/or dentures?: No Does patient usually wear dentures?: No  CIWA:    COWS:     Musculoskeletal: Strength & Muscle Tone: within normal limits Gait & Station:  normal Patient leans: N/A  Psychiatric Specialty Exam:  Presentation  General Appearance:  Fairly Groomed  Eye Contact: Fair  Speech: Clear and Coherent  Speech Volume: Normal  Handedness: Right   Mood and Affect  Mood: Good  Affect: Labile, inappropriate  Thought Process  Thought Processes: Disorganized  Descriptions of Associations:Circumstantial  Orientation:Partial  Thought Content:Illogical  History of Schizophrenia/Schizoaffective disorder:Yes  Duration of Psychotic Symptoms:Greater than six months  Hallucinations:Couldn't assess  Ideas of Reference:None  Suicidal Thoughts:Suicidal Thoughts: No    Homicidal Thoughts:Homicidal Thoughts: No    Sensorium  Memory: Couldn't assess  Judgment: Poor  Insight: Poor   Executive Functions  Concentration: Poor  Attention Span: Poor  Recall: Poor  Fund of Knowledge: Poor  Language: Poor   Psychomotor Activity  Psychomotor Activity: Psychomotor Activity: Normal     Assets  Assets: Resilience   Sleep  Sleep: Sleep: Fair    Physical Exam: Constitutional:      Appearance: Normal appearance.  HENT:     Head: Normocephalic and atraumatic.  Eyes:     Extraocular Movements: Extraocular movements intact.  Cardiovascular:     Rate and Rhythm: Normal rate and regular rhythm.  Pulmonary:     Effort: Pulmonary effort is normal.   Musculoskeletal:        General: Normal range of motion.     Cervical back: Normal range of motion.  Skin:    General: Skin is warm and dry.   Review of Systems  Constitutional: Negative.   HENT: Negative.    Eyes: Negative.   Respiratory: Negative.    Cardiovascular: Negative.   Gastrointestinal: Negative.   Skin: Negative.   Neurological: Negative.  Blood pressure (!) 113/91, pulse 77, temperature 98.2 F (36.8 C), temperature source Oral, resp. rate 18, height 5\' 8"  (1.727 m), weight 101.2 kg,  SpO2 100%. Body mass index is 33.91  kg/m.   Treatment Plan Summary: Daily contact with patient to assess and evaluate symptoms and progress in treatment and Medication management  Plan:  Patient exhibited thought blocking and was mostly mute during most of the assessment.  When patient was able to communicate, patient provided bizarre responses to the questions addressed to her.  Patient to remain on her current psychiatric medication regimen.  Patient to continue to be evaluated and assessed to determine efficacy of treatment.  Safety and Monitoring: Voluntary admission to inpatient psychiatric unit for safety, stabilization and treatment Daily contact with patient to assess and evaluate symptoms and progress in treatment Patient's case to be discussed in multi-disciplinary team meeting Observation Level : q15 minute checks Vital signs: q12 hours Precautions: Safety  Diagnosis: Principal Problem:   Schizoaffective disorder, bipolar type (HCC) Active Problems:   Hyperlipidemia   Type II diabetes mellitus (HCC)   GAD (generalized anxiety disorder)  #Schizoaffective disorder (bipolar type) -Start Zyprexa 5 mg BID for psychosis  -Continue Thorazine to 100 mg PO TID for psychosis/mood stabilization  -Discontinued Haldol 10 mg TID on 8/4 due to lack of effectiveness -Previously discontinued risperidone 2 mg  BID as psychosis was persistent -Continue Cogentin to 1 mg BID PRN for EPS prophylaxis   Agitation protocol: -Diphenhydramine 50 mg p.o. or injection, 3 times daily as needed for agitation -Continue Thorazine 50 mg TID PRN for agitation - Previously Discontinued Haloperidol 5 mg p.o. or injection, 3 times daily as needed for agitation -Lorazepam 2 mg p.o. or injection, 3 times daily as needed for agitation  #Generalized anxiety disorder -Continue hydroxyzine 50 mg 3 times daily as needed for anxiety -Continue propranolol 10 mg 2 times daily for anxiety  #Hyperlipidemia -Continue simvastatin 10 mg  daily  Insomnia -Continue trazodone 150 mg at bedtime for the management of insomnia  Hypertension -Continue clonidine 0.1 mg every 6 hours as needed for systolic blood pressure greater than 160 or diastolic blood pressure less greater than 100  Low Vitamin D level -Continue Vitamin D 50.000 units weekly  As needed medications: -Patient to continue taking Tylenol 650 mg every 6 hours as needed for mild pain -Patient to continue taking Maalox/Mylanta 30 mL every 4 hours as needed for indigestion -Patient to continue taking Milk of Magnesia 30 mL as needed for mild constipation  Labs reviewed: Ammonia level was found to be within normal limits (<10 umol/L).  Vitamin D level was found to be decreased (20.09 ng/mL), supple.menting.  Prolactin level slightly elevated at 34. [name]  Discharge Planning: Social work and case management to assist with discharge planning and identification of hospital follow-up needs prior to discharge Estimated LOS: 5-7 days Discharge Concerns: Need to establish a safety plan; Medication compliance and effectiveness Discharge Goals: Return home with outpatient referrals for mental health follow-up including medication management/psychotherapy   I certify that inpatient services furnished can reasonably be expected to improve the patient's condition.    Starleen Blue, NP  Patient ID: Barbara George, female   DOB: 19-Jul-1995, 27 y.o.   MRN: [id number]

## 2023-01-13 NOTE — Progress Notes (Signed)
Patient ID: Barbara George, female   DOB: [birth year], 27 y.o.   MRN: [id number]   Pt is sitting up on her bed in her room. No signs of distress or discomfort. Pt remains on 1:1. Safety is maintained.

## 2023-01-13 NOTE — Progress Notes (Signed)
Upon evaluation today, patient presents linear answering questions noting feeling good, slept well last night, taking her medications, denies se to them, denies sihi or avh, she becomes vague when I start asking her regard how long she has been in [place], or if she came married or got married here " that is a difficult question I don't know how to answer" She tells me her husband visited her yesterday, when I asked if father also visited she answers very linear " they don't allow more than 1 visitor" which is correct. I informed her that family will visit with her today and then  will meet with her providers, she agreed. At this time I believe patient's psychosis improving yet still remains occasionally hyper with some disorganized thought noted but better in general, will add zyprexa zydis 5 mg bid for mood and psychosis and to help with compliance, will follow.

## 2023-01-13 NOTE — Plan of Care (Signed)
  Problem: Coping: Goal: Ability to verbalize frustrations and anger appropriately will improve Outcome: Progressing   Problem: Coping: Goal: Ability to demonstrate self-control will improve 01/13/2023 1657 by Tania Ade, RN Outcome: Progressing 01/13/2023 1650 by Tania Ade, RN Outcome: Progressing   Problem: Health Behavior/Discharge Planning: Goal: Compliance with treatment plan for underlying cause of condition will improve Outcome: Progressing   Problem: Safety: Goal: Periods of time without injury will increase Outcome: Progressing

## 2023-01-13 NOTE — Progress Notes (Signed)
1:1 Nursing note- Patient is calmer and redirectable. She is cooperative and came to dayroom to have her vitals taken. She is up watching tv with sitter close by. 1:1 continues and patient is safe.

## 2023-01-13 NOTE — Progress Notes (Signed)
Patient up talking loudly at sitter after flooding the bathroom floor in the shower. She continued to get louder and writer asked that she lower her voice while others are sleeping. She pushed sitter who was trying to redirect her. She was offered thorazine PO but refused PRN Benadryl and Ativan given.

## 2023-01-13 NOTE — Progress Notes (Signed)
1:1 Nursing note- Patient was observed visiting with her father briefly tonight. He did not stay the entire visitation d/t patient not interacting with him. She was very pleasant on tonight laughing and talking with her sitter and staff. She was compliant with her medications and writer informed her about Zyprexa added on tonight. She questioned if this was just for tonight or every night. Writer asked that she get that clarified with her doctor on tomorrow. Support given and 1:1 continues with sitter at patients side.

## 2023-01-14 ENCOUNTER — Encounter (HOSPITAL_COMMUNITY): Payer: Self-pay

## 2023-01-14 DIAGNOSIS — F250 Schizoaffective disorder, bipolar type: Secondary | ICD-10-CM | POA: Diagnosis not present

## 2023-01-14 MED ORDER — OLANZAPINE 10 MG PO TBDP
10.0000 mg | ORAL_TABLET | Freq: Every day | ORAL | Status: DC
  Administered 2023-01-14 – 2023-01-17 (×4): 10 mg via ORAL
  Filled 2023-01-14 (×6): qty 1

## 2023-01-14 NOTE — [facility] Counselor (Signed)
[facility] LCSW Progress Note   01/14/2023    12:23 PM  Barbara George      Type of Note:  ACT Referrals    CSW did two referrals to get patient connected with an ACT team. The first referral was [facility] online and the second referral was faxed to [facility]. Also reached out to [facility] and left a voicemail to see if they had availability and if so, provide CSW fax number for referral to be sent to.     Signed:   Jacob Moores, MSW, [facility] 01/14/2023 12:23 PM

## 2023-01-14 NOTE — Progress Notes (Signed)
Pt on unit, agitated and argumentative. During morning administration pt accepted thorazine however when nurse gave zyprexa pt took med cup and ran down the hall to her room. Pt did eventually accept with much encouragement from staff. Pt is currently with menses and refusing sanitary napkin, also smearing blood in room. Pt put clothes in shower and turned shower on and refused to turn it off. Pt received agitation protocol PO.

## 2023-01-14 NOTE — [facility] Group Notes (Signed)
Adult Psychoeducational Group Note  Date:  01/14/2023 Time:  9:25 PM  Group Topic/Focus:  Wrap-Up Group:   The focus of this group is to help patients review their daily goal of treatment and discuss progress on daily workbooks.  Participation Level:  Active  Participation Quality:  Redirectable  Affect:  Excited  Cognitive:  Disorganized and Delusional  Insight: Limited  Engagement in Group:  Distracting and Off Topic  Modes of Intervention:  Discussion  Additional Comments:   Pt states that she had a good day, had a visit with her husband and was able to get good sleep today. Pt had to continually be redirected during group due to off topic conversations. Pt denied everything.  Vevelyn Pat 01/14/2023, 9:25 PM

## 2023-01-14 NOTE — Progress Notes (Signed)
EKG completed per doctor order

## 2023-01-14 NOTE — Group Note (Signed)
LCSW Group Therapy Note  Group Date: 01/14/2023 Start Time: 1300 End Time: 1400   Type of Therapy and Topic:  Group Therapy: Anger Cues and Responses  Participation Level:  Did Not Attend   Description of Group:   In this group, patients learned how to recognize the physical, cognitive, emotional, and behavioral responses they have to anger-provoking situations.  They identified a recent time they became angry and how they reacted.  They analyzed how their reaction was possibly beneficial and how it was possibly unhelpful.  The group discussed a variety of healthier coping skills that could help with such a situation in the future.  Focus was placed on how helpful it is to recognize the underlying emotions to our anger, because working on those can lead to a more permanent solution as well as our ability to focus on the important rather than the urgent.  Therapeutic Goals: Patients will remember their last incident of anger and how they felt emotionally and physically, what their thoughts were at the time, and how they behaved. Patients will identify how their behavior at that time worked for them, as well as how it worked against them. Patients will explore possible new behaviors to use in future anger situations. Patients will learn that anger itself is normal and cannot be eliminated, and that healthier reactions can assist with resolving conflict rather than worsening situations.  Summary of Patient Progress:    Did Not Attend   Therapeutic Modalities:   Cognitive Behavioral Therapy  Beather Arbour 01/14/2023  2:14 PM

## 2023-01-14 NOTE — Plan of Care (Signed)
  Problem: Coping: Goal: Will verbalize feelings Outcome: Progressing   Problem: Health Behavior/Discharge Planning: Goal: Compliance with prescribed medication regimen will improve Outcome: Progressing   Problem: Coping: Goal: Ability to use eye contact when communicating with others will improve Outcome: Progressing

## 2023-01-14 NOTE — Plan of Care (Signed)
  Problem: Education: Goal: Mental status will improve Outcome: Progressing   Problem: Activity: Goal: Interest or engagement in activities will improve Outcome: Progressing   Problem: Coping: Goal: Ability to verbalize frustrations and anger appropriately will improve Outcome: Progressing Goal: Ability to demonstrate self-control will improve Outcome: Progressing   Problem: Safety: Goal: Periods of time without injury will increase Outcome: Progressing

## 2023-01-14 NOTE — BH IP Treatment Plan (Signed)
Interdisciplinary Treatment and Diagnostic Plan Update  01/14/2023 Time of Session: 934am Barbara George MRN: [id number]  Principal Diagnosis: Schizoaffective disorder, bipolar type (HCC)  Secondary Diagnoses: Principal Problem:   Schizoaffective disorder, bipolar type (HCC) Active Problems:   Hyperlipidemia   Type II diabetes mellitus (HCC)   GAD (generalized anxiety disorder)   Current Medications:  Current Facility-Administered Medications  Medication Dose Route Frequency Provider Last Rate Last Admin   acetaminophen (TYLENOL) tablet 650 mg  650 mg Oral Q6H PRN Massengill, Harrold Donath, MD   650 mg at 01/06/23 1049   alum & mag hydroxide-simeth (MAALOX/MYLANTA) 200-200-20 MG/5ML suspension 30 mL  30 mL Oral Q4H PRN Massengill, Harrold Donath, MD       benztropine (COGENTIN) tablet 1 mg  1 mg Oral BID PRN Starleen Blue, NP       Or   benztropine mesylate (COGENTIN) injection 1 mg  1 mg Intramuscular BID PRN Starleen Blue, NP       chlorproMAZINE (THORAZINE) tablet 100 mg  100 mg Oral TID Starleen Blue, NP   100 mg at 01/14/23 4098   chlorproMAZINE (THORAZINE) tablet 50 mg  50 mg Oral TID PRN Phineas Inches, MD   50 mg at 01/11/23 0600   cloNIDine (CATAPRES) tablet 0.1 mg  0.1 mg Oral Q6H PRN Abbott Pao, Nadir, MD       diphenhydrAMINE (BENADRYL) capsule 50 mg  50 mg Oral TID PRN Phineas Inches, MD   50 mg at 01/12/23 1191   Or   diphenhydrAMINE (BENADRYL) injection 50 mg  50 mg Intramuscular TID PRN Phineas Inches, MD   50 mg at 01/13/23 0515   hydrOXYzine (ATARAX) tablet 50 mg  50 mg Oral TID PRN Phineas Inches, MD   50 mg at 01/13/23 2036   LORazepam (ATIVAN) tablet 2 mg  2 mg Oral TID PRN Phineas Inches, MD   2 mg at 01/12/23 4782   Or   LORazepam (ATIVAN) injection 2 mg  2 mg Intramuscular TID PRN Phineas Inches, MD   2 mg at 01/13/23 0515   magnesium hydroxide (MILK OF MAGNESIA) suspension 30 mL  30 mL Oral Daily PRN Massengill, Harrold Donath, MD       nicotine  polacrilex (NICORETTE) gum 2 mg  2 mg Oral PRN Starleen Blue, NP   2 mg at 01/08/23 1501   OLANZapine zydis (ZYPREXA) disintegrating tablet 5 mg  5 mg Oral BID Starleen Blue, NP   5 mg at 01/14/23 0811   propranolol (INDERAL) tablet 10 mg  10 mg Oral BID Starleen Blue, NP   10 mg at 01/13/23 2036   simvastatin (ZOCOR) tablet 10 mg  10 mg Oral q1800 Starleen Blue, NP   10 mg at 01/13/23 1729   Vitamin D (Ergocalciferol) (DRISDOL) 1.25 MG (50000 UNIT) capsule 50,000 Units  50,000 Units Oral Q7 days Starleen Blue, NP   50,000 Units at 01/08/23 1016   PTA Medications: Medications Prior to Admission  Medication Sig Dispense Refill Last Dose   ARIPiprazole (ABILIFY) 10 MG tablet Take 1 tablet (10 mg total) by mouth as directed for 15 doses. Taper off oral abilify as follows: Take 2 tablets once daily for 5 days. Then decrease to taking 1 tablet once daily for 5 days. Then stop. 15 tablet 0    [START ON 01/15/2023] ARIPiprazole ER (ABILIFY MAINTENA) 400 MG SRER injection Inject 2 mLs (400 mg total) into the muscle every 28 (twenty-eight) days for 1 dose. Next dose is due on 01-15-23. 1 each 0  hydrOXYzine (ATARAX) 50 MG tablet Take 1 tablet (50 mg total) by mouth 3 (three) times daily as needed for anxiety. 30 tablet 0    risperiDONE (RISPERDAL M-TABS) 1 MG disintegrating tablet Take 1 tablet (1 mg total) by mouth at bedtime. 30 tablet 0    simvastatin (ZOCOR) 10 MG tablet Take 1 tablet (10 mg total) by mouth daily at 6 PM for 14 days. 14 tablet 0    traZODone (DESYREL) 150 MG tablet Take 1 tablet (150 mg total) by mouth at bedtime. 30 tablet 0     Patient Stressors:    Patient Strengths:    Treatment Modalities: Medication Management, Group therapy, Case management,  1 to 1 session with clinician, Psychoeducation, Recreational therapy.   Physician Treatment Plan for Primary Diagnosis: Schizoaffective disorder, bipolar type (HCC) Long Term Goal(s): Improvement in symptoms so as ready for  discharge   Short Term Goals: Ability to identify changes in lifestyle to reduce recurrence of condition will improve Ability to verbalize feelings will improve Ability to disclose and discuss suicidal ideas Ability to demonstrate self-control will improve Ability to identify and develop effective coping behaviors will improve Ability to maintain clinical measurements within normal limits will improve Compliance with prescribed medications will improve  Medication Management: Evaluate patient's response, side effects, and tolerance of medication regimen.  Therapeutic Interventions: 1 to 1 sessions, Unit Group sessions and Medication administration.  Evaluation of Outcomes: Not Progressing  Physician Treatment Plan for Secondary Diagnosis: Principal Problem:   Schizoaffective disorder, bipolar type (HCC) Active Problems:   Hyperlipidemia   Type II diabetes mellitus (HCC)   GAD (generalized anxiety disorder)  Long Term Goal(s): Improvement in symptoms so as ready for discharge   Short Term Goals: Ability to identify changes in lifestyle to reduce recurrence of condition will improve Ability to verbalize feelings will improve Ability to disclose and discuss suicidal ideas Ability to demonstrate self-control will improve Ability to identify and develop effective coping behaviors will improve Ability to maintain clinical measurements within normal limits will improve Compliance with prescribed medications will improve     Medication Management: Evaluate patient's response, side effects, and tolerance of medication regimen.  Therapeutic Interventions: 1 to 1 sessions, Unit Group sessions and Medication administration.  Evaluation of Outcomes: Not Progressing   RN Treatment Plan for Primary Diagnosis: Schizoaffective disorder, bipolar type (HCC) Long Term Goal(s): Knowledge of disease and therapeutic regimen to maintain health will improve  Short Term Goals: Ability to remain free  from injury will improve, Ability to verbalize frustration and anger appropriately will improve, Ability to demonstrate self-control, Ability to participate in decision making will improve, Ability to verbalize feelings will improve, Ability to disclose and discuss suicidal ideas, Ability to identify and develop effective coping behaviors will improve, and Compliance with prescribed medications will improve  Medication Management: RN will administer medications as ordered by provider, will assess and evaluate patient's response and provide education to patient for prescribed medication. RN will report any adverse and/or side effects to prescribing provider.  Therapeutic Interventions: 1 on 1 counseling sessions, Psychoeducation, Medication administration, Evaluate responses to treatment, Monitor vital signs and CBGs as ordered, Perform/monitor CIWA, COWS, AIMS and Fall Risk screenings as ordered, Perform wound care treatments as ordered.  Evaluation of Outcomes: Not Progressing   LCSW Treatment Plan for Primary Diagnosis: Schizoaffective disorder, bipolar type (HCC) Long Term Goal(s): Safe transition to appropriate next level of care at discharge, Engage patient in therapeutic group addressing interpersonal concerns.  Short Term Goals: Engage  patient in aftercare planning with referrals and resources, Increase social support, Increase ability to appropriately verbalize feelings, Increase emotional regulation, Facilitate acceptance of mental health diagnosis and concerns, Facilitate patient progression through stages of change regarding substance use diagnoses and concerns, Identify triggers associated with mental health/substance abuse issues, and Increase skills for wellness and recovery  Therapeutic Interventions: Assess for all discharge needs, 1 to 1 time with Social worker, Explore available resources and support systems, Assess for adequacy in community support network, Educate family and  significant other(s) on suicide prevention, Complete Psychosocial Assessment, Interpersonal group therapy.  Evaluation of Outcomes: Not Progressing   Progress in Treatment: Attending groups: Yes. Participating in groups: Yes. Taking medication as prescribed: Yes. Toleration medication: Yes. Family/Significant other contact made: No, will contact:  when consents are given Patient understands diagnosis: No. Discussing patient identified problems/goals with staff: Yes. Medical problems stabilized or resolved: No. Denies suicidal/homicidal ideation: Yes. Issues/concerns per patient self-inventory: No. Other:   New problem(s) identified: No, Describe:  none  New Short Term/Long Term Goal(s):medication stabilization, elimination of SI thoughts, development of comprehensive mental wellness plan.      Patient Goals:  "Help with depression"   Discharge Plan or Barriers:  Patient recently admitted. CSW will continue to follow and assess for appropriate referrals and possible discharge planning.        Reason for Continuation of Hospitalization: Delusions  Depression Medication stabilization  Estimated Length of Stay:5-7 days  Last 3 [place] Suicide Severity Risk Score: Flowsheet Row [facility] (Current[facility] Most recent reading at 12/28/2022  5:00 PM Admission (Discharged) [facility] Most recent reading at 12/11/2022  9:27 PM ED from 12/11/2022 in [facility] Most recent reading at 12/11/2022 11:51 AM  C-SSRS RISK CATEGORY No Risk No Risk No Risk       Last PHQ 2/9 Scores:    12/10/2022    8:03 AM 09/17/2022    1:18 PM 07/24/2021    3:15 PM  Depression screen PHQ 2/9  Decreased Interest 0 1 0  Down, Depressed, Hopeless 3 0 0  PHQ - 2 Score 3 1 0  Altered sleeping 3 0   Tired, decreased energy 1 1   Change in appetite 1 0   Feeling bad or  failure about yourself  1 0   Trouble concentrating 2 0   Moving slowly or fidgety/restless 2 0   Suicidal thoughts 0 0   PHQ-9 Score 13 2   Difficult doing work/chores Very difficult      Scribe for Treatment Team: Charise Killian 01/14/2023 10:10 AM

## 2023-01-14 NOTE — Progress Notes (Signed)
   01/14/23 2000  Psych Admission Type (Psych Patients Only)  Admission Status Involuntary  Psychosocial Assessment  Patient Complaints Anxiety;Restlessness  Eye Contact Fair  Facial Expression Anxious  Affect Anxious;Labile  Speech Logical/coherent  Interaction Childlike;Attention-seeking  Motor Activity Restless  Appearance/Hygiene Layered clothes  Behavior Characteristics Intrusive;Hyperactive  Mood Anxious  Aggressive Behavior  Effect No apparent injury  Thought Process  Coherency Circumstantial;Disorganized  Content Blaming others;Preoccupation  Delusions None reported or observed  Perception WDL  Hallucination None reported or observed  Judgment Poor  Confusion None  Danger to Self  Current suicidal ideation? Denies

## 2023-01-14 NOTE — Progress Notes (Signed)
   01/14/23 0910  Psych Admission Type (Psych Patients Only)  Admission Status Involuntary  Psychosocial Assessment  Patient Complaints Anxiety;Irritability;Hyperactivity  Eye Contact Staring  Facial Expression Animated  Affect Labile  Speech Logical/coherent  Interaction Assertive  Motor Activity Restless  Appearance/Hygiene Layered clothes  Behavior Characteristics Hyperactive;Intrusive  Mood Preoccupied  Thought Process  Coherency Disorganized  Content Preoccupation  Delusions None reported or observed  Perception WDL  Hallucination None reported or observed  Judgment Limited  Confusion None  Danger to Self  Current suicidal ideation? Denies  Self-Injurious Behavior No self-injurious ideation or behavior indicators observed or expressed   Agreement Not to Harm Self No  Description of Agreement verbally contracts for safety  Danger to Others  Danger to Others None reported or observed

## 2023-01-14 NOTE — Progress Notes (Signed)
Patient ID: Barbara George, female   DOB: [birth year], 27 y.o.   MRN: [id number]   Pt ambulatory in the hallway and interacting with staff and other pts. No signs of distress or discomfort noted. 1:1 remains for safety. Pt is safe on unit.

## 2023-01-14 NOTE — Progress Notes (Signed)
Nursing 1:1 note D:Pt observed sleeping in bed with eyes closed. RR even and unlabored. No distress noted. A: 1:1 observation continues for safety  R: pt remains safe  

## 2023-01-14 NOTE — Progress Notes (Signed)
Patient ID: Barbara George, female   DOB: Jul 28, 1995, 27 y.o.   MRN: [id number]   Pt went to the cafeteria to eat dinner. Pt was calm and appropriate during mealtime off the unit. 1:1 continues for safety. Pt remains safe on and off the unit.

## 2023-01-14 NOTE — Progress Notes (Signed)
1:1 note - Patient is asleep respirations even and unlabored, no distress noted. 1:1 continues and patient is safe.

## 2023-01-14 NOTE — Progress Notes (Signed)
Patient ID: Barbara George, female   DOB: 1995-09-01, 27 y.o.   MRN: [id number]    Pt sitting on the bench in her room. No signs of discomfort/distress noted. 1:1 remains for pt's safety. Pt is safe on the unit.

## 2023-01-14 NOTE — Progress Notes (Signed)
1:1 nursing note- Patient is asleep respirations even and unlabored. Patient is safe and 1:1 continues.

## 2023-01-14 NOTE — Progress Notes (Signed)
[facility] MD Progress Note  01/14/2023 8:24 PM Barbara George  MRN:  [id number]  Reason For Admission: Barbara George is a 27 year old African-American female with a past psychiatric history significant for schizoaffective disorder (bipolar type) who was admitted to [facility] from [facility], ED after presenting to the ED on 7/25 by her husband and father because she stopped eating, stopped talking, and became more withdrawn the day prior.  Per patient's father, history of nonmedication compliance and patient typically after discharge, which always leads to a relapse in her symptoms.  Patient was already receiving transferred back to [facility] under IVC for treatment and stabilization of her mental status.  Yesterday the psychiatry team made the following recommendations:  -Continue Zyprexa 5 mg BID for psychosis  -Continue Thorazine to 100 mg PO TID for psychosis/mood stabilization  -Discontinued Haldol 10 mg TID on 8/4 due to lack of effectiveness -Previously discontinued risperidone 2 mg  BID as psychosis was persistent -Continue Cogentin to 1 mg BID PRN for EPS prophylaxis   On assessment today, the pt alert, oriented and easily redirected.  She was pulled from the day room to her room for assessment.  Reports that her mood is fine, however, appears angry. When asked how she was doing, responded, "you guys wants to keep me here forever, God is taking care of me and not anyone."  Then added I want to see Gwenyth Bouillon."  Looking at this provider, added, "you are a Christian right?  Do you see Jesus?"  Redirected patient back to the assessment.  Speech appears disorganized and tangential. Reports that anxiety is rated as 0/10. Nursing staff report patient sleeping over 6.75 hours last night.   Appetite is good Concentration is fair Energy level is adequate Denies suicidal thoughts and denies suicidal intent or plan.  Became angry when talking about suicide.  Report  Moslems do not killed themselves. Denies having any HI.  Denies having psychotic symptoms, and does not appear to be responding to internal or external stimuli.  Denies having side effects to current psychiatric medications. No TD/EPS type symptoms found on assessment, and pt denies any feelings of stiffness. AIMS: 0.  EKG done awaiting results, but we will be repeating it tomorrow due to the patient being on 2 antipsychotic medications.  We discussed compliance to current medication regimen.  Discussed the following psychosocial stressors:   Keeping herself clean, due to being on her menstrual period without using menstrual pads.  Nursing staff reports patient smearing the blood on the commode and bathroom walls.  When asked if she required a different type of pad, patient put her hand in the pocket and pull out a clean menstrual pad and asked this provider to leave her alone.  Patient appears to be playing games with the staff and responds whenever she wants to respond.  The plan is to discharge patient to go to IOP as per family request.  Will continue to monitor patient's mood stability, medication management and safety.  Date 01/13/23 family meeting: In attendance with her husband, (adamou,amadou), father Cephus Shelling), patient, patient's 2 brothers, Clinical research associate and attending psychiatrist.  Meeting was held in the conference room of the hospital at 3 PM and lasted for an hour.  Meeting started by having patient's assigned RN taking patient to the conference room to meet with her family in the absence of the providers.  Providers went into the meeting 10 minutes later, introduced themselves, and asked patient to identify her family members, and  she was only able to identify her husband who was sitting across from her.  As per patient's family members, patient was being gamy, as she was previously able to recognize her family members, and engage in conversations with them, without the providers being in  the room.   Principal Problem: Schizoaffective disorder, bipolar type (HCC) Diagnosis: Principal Problem:   Schizoaffective disorder, bipolar type (HCC) Active Problems:   Hyperlipidemia   Type II diabetes mellitus (HCC)   GAD (generalized anxiety disorder)  Total Time spent with patient: 35 minutes   Past Psychiatric History:  See H&P  Past Medical History:  Past Medical History:  Diagnosis Date   Bipolar 1 disorder (HCC)    Depression    Diabetes mellitus without complication (HCC)    Type 2 (Pre)   Schizophrenia (HCC)    History reviewed. No pertinent surgical history. Family History:  Family History  Problem Relation Age of Onset   Hypertension Father    Family Psychiatric  History: See H&P  Social History:  Social History   Substance and Sexual Activity  Alcohol Use No   Alcohol/week: 0.0 standard drinks of alcohol     Social History   Substance and Sexual Activity  Drug Use No    Social History   Socioeconomic History   Marital status: Married    Spouse name: Amadou   Number of children: Not on file   Years of education: Not on file   Highest education level: Associate degree: occupational, Scientist, product/process development, or vocational program  Occupational History   Occupation: unemployed  Tobacco Use   Smoking status: Never   Smokeless tobacco: Never  Vaping Use   Vaping status: Never Used  Substance and Sexual Activity   Alcohol use: No    Alcohol/week: 0.0 standard drinks of alcohol   Drug use: No   Sexual activity: Yes    Birth control/protection: None  Other Topics Concern   Not on file  Social History Narrative   ** Merged History Encounter **       Social Determinants of Health   Financial Resource Strain: Not on file  Food Insecurity: Patient Declined (12/28/2022)   Hunger Vital Sign    Worried About Running Out of Food in the Last Year: Patient declined    Ran Out of Food in the Last Year: Patient declined  Transportation Needs: Patient Declined  (12/28/2022)   PRAPARE - Administrator, Civil Service (Medical): Patient declined    Lack of Transportation (Non-Medical): Patient declined  Physical Activity: Inactive (04/03/2019)   Exercise Vital Sign    Days of Exercise per Week: 0 days    Minutes of Exercise per Session: 0 min  Stress: Not on file  Social Connections: Socially Integrated (04/03/2019)   Social Connection and Isolation Panel [NHANES]    Frequency of Communication with Friends and Family: More than three times a week    Frequency of Social Gatherings with Friends and Family: Once a week    Attends Religious Services: More than 4 times per year    Active Member of [employer] or Organizations: Yes    Attends Engineer, structural: Not on file    Marital Status: Married   Sleep: Good  Appetite: Good  Current Medications: Current Facility-Administered Medications  Medication Dose Route Frequency Provider Last Rate Last Admin   acetaminophen (TYLENOL) tablet 650 mg  650 mg Oral Q6H PRN Massengill, Harrold Donath, MD   650 mg at 01/06/23 1049   alum &  mag hydroxide-simeth (MAALOX/MYLANTA) 200-200-20 MG/5ML suspension 30 mL  30 mL Oral Q4H PRN Massengill, Harrold Donath, MD       benztropine (COGENTIN) tablet 1 mg  1 mg Oral BID PRN Starleen Blue, NP       Or   benztropine mesylate (COGENTIN) injection 1 mg  1 mg Intramuscular BID PRN Starleen Blue, NP       chlorproMAZINE (THORAZINE) tablet 100 mg  100 mg Oral TID Starleen Blue, NP   100 mg at 01/14/23 4696   chlorproMAZINE (THORAZINE) tablet 50 mg  50 mg Oral TID PRN Phineas Inches, MD   50 mg at 01/14/23 1051   cloNIDine (CATAPRES) tablet 0.1 mg  0.1 mg Oral Q6H PRN Abbott Pao, Nadir, MD       diphenhydrAMINE (BENADRYL) capsule 50 mg  50 mg Oral TID PRN Phineas Inches, MD   50 mg at 01/14/23 1051   Or   diphenhydrAMINE (BENADRYL) injection 50 mg  50 mg Intramuscular TID PRN Phineas Inches, MD   50 mg at 01/13/23 0515   hydrOXYzine (ATARAX) tablet 50 mg  50  mg Oral TID PRN Phineas Inches, MD   50 mg at 01/13/23 2036   LORazepam (ATIVAN) tablet 2 mg  2 mg Oral TID PRN Phineas Inches, MD   2 mg at 01/14/23 1051   Or   LORazepam (ATIVAN) injection 2 mg  2 mg Intramuscular TID PRN Phineas Inches, MD   2 mg at 01/13/23 0515   magnesium hydroxide (MILK OF MAGNESIA) suspension 30 mL  30 mL Oral Daily PRN Massengill, Harrold Donath, MD       nicotine polacrilex (NICORETTE) gum 2 mg  2 mg Oral PRN Starleen Blue, NP   2 mg at 01/08/23 1501   OLANZapine zydis (ZYPREXA) disintegrating tablet 10 mg  10 mg Oral QHS ,  C, Barbara George       propranolol (INDERAL) tablet 10 mg  10 mg Oral BID Starleen Blue, NP   10 mg at 01/13/23 2036   simvastatin (ZOCOR) tablet 10 mg  10 mg Oral q1800 Starleen Blue, NP   10 mg at 01/14/23 1845   Vitamin D (Ergocalciferol) (DRISDOL) 1.25 MG (50000 UNIT) capsule 50,000 Units  50,000 Units Oral Q7 days Starleen Blue, NP   50,000 Units at 01/08/23 1016   Lab Results:  No results found for this or any previous visit (from the past 48 hour(s)).  Blood Alcohol level:  Lab Results  Component Value Date   ETH <10 12/27/2022   ETH <10 12/11/2022    Metabolic Disorder Labs: Lab Results  Component Value Date   HGBA1C 6.2 (H) 12/11/2022   MPG 131 12/11/2022   MPG 111.15 10/23/2018   Lab Results  Component Value Date   PROLACTIN 34.3 (H) 12/30/2022   PROLACTIN 13.0 12/11/2022   Lab Results  Component Value Date   CHOL 137 01/05/2023   TRIG 62 01/05/2023   HDL 62 01/05/2023   CHOLHDL 2.2 01/05/2023   VLDL 12 01/05/2023   LDLCALC 63 01/05/2023   LDLCALC 113 (H) 12/11/2022   Physical Findings: AIMS: 0Overall Severity Severity of abnormal movements (highest score from questions above): None, normal Incapacitation due to abnormal movements: None, normal Patient's awareness of abnormal movements (rate only patient's report): No Awareness, Dental Status Current problems with teeth and/or dentures?: No Does patient  usually wear dentures?: No  CIWA:    COWS:     Musculoskeletal: Strength & Muscle Tone: within normal limits Gait & Station: normal Patient leans:  N/A  Psychiatric Specialty Exam:  Presentation  General Appearance:  Casual  Eye Contact: Fair  Speech: Clear and Coherent  Speech Volume: Normal  Handedness: Right  Mood and Affect  Mood: Good  Affect: Labile, inappropriate  Thought Process  Thought Processes: Disorganized  Descriptions of Associations:Circumstantial  Orientation:Partial  Thought Content:Illogical; Rumination  History of Schizophrenia/Schizoaffective disorder:Yes  Duration of Psychotic Symptoms:Greater than six months  Hallucinations:Couldn't assess  Ideas of Reference:None  Suicidal Thoughts:Suicidal Thoughts: No  Homicidal Thoughts:Homicidal Thoughts: No  Sensorium  Memory: Couldn't assess  Judgment: Poor  Insight: Poor  Executive Functions  Concentration: Fair  Attention Span: Fair  Recall: Poor  Fund of Knowledge: Poor  Language: Fair   Psychomotor Activity  Psychomotor Activity: Psychomotor Activity: Normal  Assets  Assets: Resilience; Housing  Sleep  Sleep: Sleep: Good Number of Hours of Sleep: 6.75  Physical Exam: Constitutional:      Appearance: Normal appearance.  HENT:     Head: Normocephalic and atraumatic.  Eyes:     Extraocular Movements: Extraocular movements intact.  Cardiovascular:     Rate and Rhythm: Normal rate and regular rhythm.  Pulmonary:     Effort: Pulmonary effort is normal.   Musculoskeletal:        General: Normal range of motion.     Cervical back: Normal range of motion.  Skin:    General: Skin is warm and dry.   Review of Systems  Constitutional: Negative.   HENT: Negative.    Eyes: Negative.   Respiratory: Negative.    Cardiovascular: Negative.   Gastrointestinal: Negative.   Skin: Negative.   Neurological: Negative.  Blood pressure 127/78, pulse 85,  temperature 98.2 F (36.8 C), temperature source Oral, resp. rate 18, height 5\' 8"  (1.727 m), weight 101.2 kg, SpO2 100%. Body mass index is 33.91 kg/m.   Treatment Plan Summary: Daily contact with patient to assess and evaluate symptoms and progress in treatment and Medication management  Plan:  Patient exhibited thought blocking and was mostly mute during most of the assessment.  When patient was able to communicate, patient provided bizarre responses to the questions addressed to her.  Patient to remain on her current psychiatric medication regimen.  Patient to continue to be evaluated and assessed to determine efficacy of treatment.  Safety and Monitoring: Voluntary admission to inpatient psychiatric unit for safety, stabilization and treatment Daily contact with patient to assess and evaluate symptoms and progress in treatment Patient's case to be discussed in multi-disciplinary team meeting Observation Level : q15 minute checks Vital signs: q12 hours Precautions: Safety  Diagnosis: Principal Problem:   Schizoaffective disorder, bipolar type (HCC) Active Problems:   Hyperlipidemia   Type II diabetes mellitus (HCC)   GAD (generalized anxiety disorder)  #Schizoaffective disorder (bipolar type) -Continue Zyprexa 5 mg BID for psychosis  -Continue Thorazine to 100 mg PO TID for psychosis/mood stabilization  -Discontinued Haldol 10 mg TID on 8/4 due to lack of effectiveness -Previously discontinued risperidone 2 mg  BID as psychosis was persistent -Continue Cogentin to 1 mg BID PRN for EPS prophylaxis   Agitation protocol: -Diphenhydramine 50 mg p.o. or injection, 3 times daily as needed for agitation -Continue Thorazine 50 mg TID PRN for agitation - Previously Discontinued Haloperidol 5 mg p.o. or injection, 3 times daily as needed for agitation -Lorazepam 2 mg p.o. or injection, 3 times daily as needed for agitation  #Generalized anxiety disorder -Continue hydroxyzine 50 mg 3  times daily as needed for anxiety -Continue propranolol 10 mg  2 times daily for anxiety  #Hyperlipidemia -Continue simvastatin 10 mg daily  Insomnia -Continue trazodone 150 mg at bedtime for the management of insomnia  Hypertension -Continue clonidine 0.1 mg every 6 hours as needed for systolic blood pressure greater than 160 or diastolic blood pressure less greater than 100  Low Vitamin D level -Continue Vitamin D 50.000 units weekly  As needed medications: -Patient to continue taking Tylenol 650 mg every 6 hours as needed for mild pain -Patient to continue taking Maalox/Mylanta 30 mL every 4 hours as needed for indigestion -Patient to continue taking Milk of Magnesia 30 mL as needed for mild constipation  Labs reviewed: Ammonia level was found to be within normal limits (<10 umol/L).  Vitamin D level was found to be decreased (20.09 ng/mL), supple.menting.  Prolactin level slightly elevated at 34. [name]  Discharge Planning: Social work and case management to assist with discharge planning and identification of hospital follow-up needs prior to discharge Estimated LOS: 5-7 days Discharge Concerns: Need to establish a safety plan; Medication compliance and effectiveness Discharge Goals: Return home with outpatient referrals for mental health follow-up including medication management/psychotherapy   I certify that inpatient services furnished can reasonably be expected to improve the patient's condition.    Barbara Lowers, Barbara George  Patient ID: Barbara George, female   DOB: [birth year], 27 y.o.   MRN: [id number]  Patient ID: Barbara George, female   DOB: [birth year], 27 y.o.   MRN: [id number]

## 2023-01-14 NOTE — Progress Notes (Signed)
Pt is sitting up on her bed in her room. No signs of distress or discomfort. Pt remains on 1:1. Safety is maintained.

## 2023-01-15 DIAGNOSIS — F250 Schizoaffective disorder, bipolar type: Secondary | ICD-10-CM | POA: Diagnosis not present

## 2023-01-15 NOTE — Plan of Care (Signed)
  Problem: Education: Goal: Emotional status will improve Outcome: Progressing Goal: Mental status will improve Outcome: Progressing   Problem: Activity: Goal: Sleeping patterns will improve Outcome: Progressing   Problem: Safety: Goal: Periods of time without injury will increase Outcome: Progressing   

## 2023-01-15 NOTE — Progress Notes (Signed)
Patient in the dayroom watching TV with her peers. Patient calm and cooperative. Scheduled medications administered along with PRN hydroxyzine, per MAR. 1:1 monitoring continues. PRN medication effective. Patient remains safe at this time.

## 2023-01-15 NOTE — Progress Notes (Signed)
Pt up in her room not listening to redirection from her sitter, pt pushing the STARR button , pt given PRN agitation protocol per [facility]

## 2023-01-15 NOTE — Progress Notes (Signed)
   01/15/23 2000  Psych Admission Type (Psych Patients Only)  Admission Status Involuntary  Psychosocial Assessment  Patient Complaints Anxiety;Restlessness  Eye Contact Fair  Facial Expression Anxious  Affect Anxious;Labile  Speech Logical/coherent  Interaction Childlike;Attention-seeking  Motor Activity Restless  Appearance/Hygiene Layered clothes  Behavior Characteristics Anxious;Cooperative  Mood Anxious;Suspicious  Aggressive Behavior  Effect No apparent injury  Thought Process  Coherency Circumstantial;Disorganized  Content Blaming others;Preoccupation  Delusions None reported or observed  Perception WDL  Hallucination None reported or observed  Judgment Poor  Confusion None  Danger to Self  Current suicidal ideation? Denies  Danger to Others  Danger to Others None reported or observed

## 2023-01-15 NOTE — Plan of Care (Signed)
°  Problem: Education: °Goal: Emotional status will improve °Outcome: Progressing °Goal: Mental status will improve °Outcome: Progressing °Goal: Verbalization of understanding the information provided will improve °Outcome: Progressing °  °

## 2023-01-15 NOTE — Progress Notes (Signed)
Nursing 1:1 note D:Pt observed sleeping in bed with eyes closed. RR even and unlabored. No distress noted. A: 1:1 observation continues for safety  R: pt remains safe  

## 2023-01-15 NOTE — Progress Notes (Signed)
Patient in the dayroom watching TV with peers. Patient calm and cooperative. 1:1 monitoring continues. Patient remains safe at this time.

## 2023-01-15 NOTE — [facility] Suicide Risk Assessment (Signed)
[facility] INPATIENT:  Family/Significant Other Suicide Prevention Education  Suicide Prevention Education:  Education Completed; [place] ( brother ) [phone],  (name of family member/significant other) has been identified by the patient as the family member/significant other with whom the patient will be residing, and identified as the person(s) who will aid the patient in the event of a mental health crisis (suicidal ideations/suicide attempt).  With written consent from the patient, the family member/significant other has been provided the following suicide prevention education, prior to the and/or following the discharge of the patient.   Brother shared that patient will either stay with him or go to back to her place with husband. Brother will pick patient up once it is time for her to DC. CSW did mention to brother about the ACT team and how often would come out if they can accept patient for services and brother understood . CSW asked about guns and weapons in his home and  brother stated that he was unsure if he had any guns or weapons at home but would check and confirmed at patient place she does not have any weapons or guns.   The suicide prevention education provided includes the following: Suicide risk factors Suicide prevention and interventions National Suicide Hotline telephone number [facility] assessment telephone number [facility] Emergency Assistance 911 [facility] and/or Residential Mobile Crisis Unit telephone number  Request made of family/significant other to: Remove weapons (e.g., guns, rifles, knives), all items previously/currently identified as safety concern.   Remove drugs/medications (over-the-counter, prescriptions, illicit drugs), all items previously/currently identified as a safety concern.  The family member/significant other verbalizes understanding of the suicide prevention education information provided.  The family member/significant  other agrees to remove the items of safety concern listed above.  Isabella Bowens 01/15/2023, 3:02 PM

## 2023-01-15 NOTE — Progress Notes (Signed)
Patient pacing halls and asking about discharge date. Education and support supplied. Patient verbalized understanding of education. 1:1 monitoring continues. Patient remains safe at this time.

## 2023-01-15 NOTE — [facility] Counselor (Signed)
[facility] LCSW Progress Note   01/15/2023    11:38 AM  Barbara George      Type of Note: ACT Referrals    CSW sent out another referral for ACT services through [facility] and checked in on the other two that were sent yesterday. For [facility] CSW have to leave a voicemail and for [facility] , CSW had to resend to another fax number per the intake coordinator since they have switched faxed due to moving into another location.     Signed:   Jacob Moores, MSW, [facility] 01/15/2023 11:38 AM

## 2023-01-15 NOTE — Progress Notes (Signed)
Pt calm as a result of the PRN medication per MAR , not agitated anymore

## 2023-01-15 NOTE — [facility] Group Notes (Signed)
Pt attended wrap up group but refused to participate

## 2023-01-15 NOTE — Progress Notes (Signed)
[facility] MD Progress Note  01/15/2023 6:35 PM Tishawna Midou Hamadou  MRN:  [id number]  Reason For Admission: Bebe Midou Hamadou is a 27 year old African-American female with a past psychiatric history significant for schizoaffective disorder (bipolar type) who was admitted to [facility] from [facility], ED after presenting to the ED on 7/25 by her husband and father because she stopped eating, stopped talking, and became more withdrawn the day prior.  Per patient's father, history of nonmedication compliance and patient typically after discharge, which always leads to a relapse in her symptoms.  Patient was already receiving transferred back to [facility] under [facility] for treatment and stabilization of her mental status.  Yesterday the psychiatry team made the following recommendations:  -Continue Zyprexa 5 mg BID for psychosis  -Continue Thorazine to 100 mg PO TID for psychosis/mood stabilization  -Discontinued Haldol 10 mg TID on 8/4 due to lack of effectiveness -Previously discontinued risperidone 2 mg  BID as psychosis was persistent -Continue Cogentin to 1 mg BID PRN for EPS prophylaxis  -Obtain EKG every other day due to being on 2 antipsychotic. Another EKG due on 01/16/23.  On assessment today, the pt alert, oriented and easily redirected.  When asked how she was doing, responded, "I feel good today."  After answering few  assessment questions, began to talk about Mohammed, nature, sciences and unrelated subjects.  Redirected patient back to the assessment.  Speech appears disorganized and tangential. Reports that anxiety is rated as 0/10. Nursing staff report patient sleeping over 7.0 hours last night.   Appetite is good Concentration is fair Energy level is adequate Denies suicidal thoughts and denies suicidal intent or plan.  Became angry when talking about suicide.  Report Moslems do not killed themselves. Denies having any HI.  Denies having psychotic symptoms, and  does not appear to be responding to internal or external stimuli.  Denies having side effects to current psychiatric medications. No TD/EPS type symptoms found on assessment, and pt denies any feelings of stiffness, drooling, tremors, or postural instability. AIMS: 0.  EKG done yesterday with results of NSR, ventricular rate 80, QT/QTc 366/422.  EKG to be repeated  tomorrow due to the patient being on 2 antipsychotic medications.  We discussed compliance to current medication regimen.  Discussed the following psychosocial stressors:   Keeping herself clean, due to being on her menstrual period without using menstrual pads.  Patient   appears clean and neat, although occasionally playing games with the staff and responds whenever she wants to respond.  The plan is to discharge patient to go to IOP as per family request.  Will continue to monitor patient's mood stability, medication management and safety.  Date 01/13/23 family meeting: In attendance with her husband, (adamou,amadou), father Cephus Shelling), patient, patient's 2 brothers, Clinical research associate and attending psychiatrist.  Meeting was held in the conference room of the hospital at 3 PM and lasted for an hour.  Meeting started by having patient's assigned RN taking patient to the conference room to meet with her family in the absence of the providers.  Providers went into the meeting 10 minutes later, introduced themselves, and asked patient to identify her family members, and she was only able to identify her husband who was sitting across from her.  As per patient's family members, patient was being gamy, as she was previously able to recognize her family members, and engage in conversations with them, without the providers being in the room.   Principal Problem: Schizoaffective disorder, bipolar type (  HCC) Diagnosis: Principal Problem:   Schizoaffective disorder, bipolar type (HCC) Active Problems:   Hyperlipidemia   Type II diabetes mellitus (HCC)    GAD (generalized anxiety disorder)  Total Time spent with patient: 35 minutes   Past Psychiatric History:  See H&P  Past Medical History:  Past Medical History:  Diagnosis Date   Bipolar 1 disorder (HCC)    Depression    Diabetes mellitus without complication (HCC)    Type 2 (Pre)   Schizophrenia (HCC)    History reviewed. No pertinent surgical history. Family History:  Family History  Problem Relation Age of Onset   Hypertension Father    Family Psychiatric  History: See H&P  Social History:  Social History   Substance and Sexual Activity  Alcohol Use No   Alcohol/week: 0.0 standard drinks of alcohol     Social History   Substance and Sexual Activity  Drug Use No    Social History   Socioeconomic History   Marital status: Married    Spouse name: Amadou   Number of children: Not on file   Years of education: Not on file   Highest education level: Associate degree: occupational, Scientist, product/process development, or vocational program  Occupational History   Occupation: unemployed  Tobacco Use   Smoking status: Never   Smokeless tobacco: Never  Vaping Use   Vaping status: Never Used  Substance and Sexual Activity   Alcohol use: No    Alcohol/week: 0.0 standard drinks of alcohol   Drug use: No   Sexual activity: Yes    Birth control/protection: None  Other Topics Concern   Not on file  Social History Narrative   ** Merged History Encounter **       Social Determinants of Health   Financial Resource Strain: Not on file  Food Insecurity: Patient Declined (12/28/2022)   Hunger Vital Sign    Worried About Running Out of Food in the Last Year: Patient declined    Ran Out of Food in the Last Year: Patient declined  Transportation Needs: Patient Declined (12/28/2022)   PRAPARE - Administrator, Civil Service (Medical): Patient declined    Lack of Transportation (Non-Medical): Patient declined  Physical Activity: Inactive (04/03/2019)   Exercise Vital Sign    Days  of Exercise per Week: 0 days    Minutes of Exercise per Session: 0 min  Stress: Not on file  Social Connections: Socially Integrated (04/03/2019)   Social Connection and Isolation Panel [NHANES]    Frequency of Communication with Friends and Family: More than three times a week    Frequency of Social Gatherings with Friends and Family: Once a week    Attends Religious Services: More than 4 times per year    Active Member of [employer] or Organizations: Yes    Attends Engineer, structural: Not on file    Marital Status: Married   Sleep: Good  Appetite: Good  Current Medications: Current Facility-Administered Medications  Medication Dose Route Frequency Provider Last Rate Last Admin   acetaminophen (TYLENOL) tablet 650 mg  650 mg Oral Q6H PRN Massengill, Harrold Donath, MD   650 mg at 01/15/23 0333   alum & mag hydroxide-simeth (MAALOX/MYLANTA) 200-200-20 MG/5ML suspension 30 mL  30 mL Oral Q4H PRN Massengill, Harrold Donath, MD       benztropine (COGENTIN) tablet 1 mg  1 mg Oral BID PRN Starleen Blue, NP       Or   benztropine mesylate (COGENTIN) injection 1 mg  1 mg  Intramuscular BID PRN Starleen Blue, NP       chlorproMAZINE (THORAZINE) tablet 100 mg  100 mg Oral TID Starleen Blue, NP   100 mg at 01/15/23 1353   chlorproMAZINE (THORAZINE) tablet 50 mg  50 mg Oral TID PRN Phineas Inches, MD   50 mg at 01/15/23 0502   cloNIDine (CATAPRES) tablet 0.1 mg  0.1 mg Oral Q6H PRN Abbott Pao, Nadir, MD       diphenhydrAMINE (BENADRYL) capsule 50 mg  50 mg Oral TID PRN Phineas Inches, MD   50 mg at 01/15/23 0502   Or   diphenhydrAMINE (BENADRYL) injection 50 mg  50 mg Intramuscular TID PRN Phineas Inches, MD   50 mg at 01/13/23 0515   hydrOXYzine (ATARAX) tablet 50 mg  50 mg Oral TID PRN Phineas Inches, MD   50 mg at 01/15/23 0917   LORazepam (ATIVAN) tablet 2 mg  2 mg Oral TID PRN Phineas Inches, MD   2 mg at 01/15/23 0503   Or   LORazepam (ATIVAN) injection 2 mg  2 mg Intramuscular  TID PRN Phineas Inches, MD   2 mg at 01/13/23 0515   magnesium hydroxide (MILK OF MAGNESIA) suspension 30 mL  30 mL Oral Daily PRN Massengill, Harrold Donath, MD       nicotine polacrilex (NICORETTE) gum 2 mg  2 mg Oral PRN Starleen Blue, NP   2 mg at 01/15/23 0916   OLANZapine zydis (ZYPREXA) disintegrating tablet 10 mg  10 mg Oral QHS Canon Gola C, FNP   10 mg at 01/14/23 2030   propranolol (INDERAL) tablet 10 mg  10 mg Oral BID Starleen Blue, NP   10 mg at 01/15/23 0917   simvastatin (ZOCOR) tablet 10 mg  10 mg Oral q1800 Starleen Blue, NP   10 mg at 01/14/23 1845   Vitamin D (Ergocalciferol) (DRISDOL) 1.25 MG (50000 UNIT) capsule 50,000 Units  50,000 Units Oral Q7 days Starleen Blue, NP   50,000 Units at 01/15/23 8657   Lab Results:  No results found for this or any previous visit (from the past 48 hour(s)).  Blood Alcohol level:  Lab Results  Component Value Date   ETH <10 12/27/2022   ETH <10 12/11/2022    Metabolic Disorder Labs: Lab Results  Component Value Date   HGBA1C 6.2 (H) 12/11/2022   MPG 131 12/11/2022   MPG 111.15 10/23/2018   Lab Results  Component Value Date   PROLACTIN 34.3 (H) 12/30/2022   PROLACTIN 13.0 12/11/2022   Lab Results  Component Value Date   CHOL 137 01/05/2023   TRIG 62 01/05/2023   HDL 62 01/05/2023   CHOLHDL 2.2 01/05/2023   VLDL 12 01/05/2023   LDLCALC 63 01/05/2023   LDLCALC 113 (H) 12/11/2022   Physical Findings: AIMS: 0Overall Severity Severity of abnormal movements (highest score from questions above): None, normal Incapacitation due to abnormal movements: None, normal Patient's awareness of abnormal movements (rate only patient's report): No Awareness, Dental Status Current problems with teeth and/or dentures?: No Does patient usually wear dentures?: No  CIWA:    COWS:     Musculoskeletal: Strength & Muscle Tone: within normal limits Gait & Station: normal Patient leans: N/A  Psychiatric Specialty Exam:  Presentation   General Appearance:  Appropriate for Environment; Casual  Eye Contact: Good  Speech: Clear and Coherent; Normal Rate  Speech Volume: Normal  Handedness: Right  Mood and Affect  Mood: Good  Affect: Labile, inappropriate  Thought Process  Thought Processes: Disorganized  Descriptions of Associations:Circumstantial  Orientation:Full (Time, Place and Person)  Thought Content:Illogical  History of Schizophrenia/Schizoaffective disorder:Yes  Duration of Psychotic Symptoms:Greater than six months  Hallucinations:Couldn't assess  Ideas of Reference:None  Suicidal Thoughts:Suicidal Thoughts: No  Homicidal Thoughts:Homicidal Thoughts: No  Sensorium  Memory: Couldn't assess  Judgment: Poor  Insight: Poor  Executive Functions  Concentration: Fair  Attention Span: Fair  Recall: Other (comment)  Fund of Knowledge: Fair (Yes but disorganized)  Language: Fair (Disorganized)   Psychomotor Activity  Psychomotor Activity: Psychomotor Activity: Normal  Assets  Assets: Communication Skills; Resilience; Physical Health; Housing; Social Support  Sleep  Sleep: Sleep: Good Number of Hours of Sleep: 7  Physical Exam: Constitutional:      Appearance: Normal appearance.  HENT:     Head: Normocephalic and atraumatic.  Eyes:     Extraocular Movements: Extraocular movements intact.  Cardiovascular:     Rate and Rhythm: Normal rate and regular rhythm.  Pulmonary:     Effort: Pulmonary effort is normal.   Musculoskeletal:        General: Normal range of motion.     Cervical back: Normal range of motion.  Skin:    General: Skin is warm and dry.   Review of Systems  Constitutional: Negative.   HENT: Negative.    Eyes: Negative.   Respiratory: Negative.    Cardiovascular: Negative.   Gastrointestinal: Negative.   Skin: Negative.   Neurological: Negative.  Blood pressure (!) 137/91, pulse 86, temperature 98.2 F (36.8 C), temperature source  Oral, resp. rate 18, height 5\' 8"  (1.727 m), weight 101.2 kg, SpO2 100%. Body mass index is 33.91 kg/m.   Treatment Plan Summary: Daily contact with patient to assess and evaluate symptoms and progress in treatment and Medication management  Plan:  Patient exhibited thought blocking and was mostly mute during most of the assessment.  When patient was able to communicate, patient provided bizarre responses to the questions addressed to her.  Patient to remain on her current psychiatric medication regimen.  Patient to continue to be evaluated and assessed to determine efficacy of treatment.  Safety and Monitoring: Voluntary admission to inpatient psychiatric unit for safety, stabilization and treatment Daily contact with patient to assess and evaluate symptoms and progress in treatment Patient's case to be discussed in multi-disciplinary team meeting Observation Level : q15 minute checks Vital signs: q12 hours Precautions: Safety  Diagnosis: Principal Problem:   Schizoaffective disorder, bipolar type (HCC) Active Problems:   Hyperlipidemia   Type II diabetes mellitus (HCC)   GAD (generalized anxiety disorder)  #Schizoaffective disorder (bipolar type) -Continue Zyprexa 5 mg BID for psychosis  -Continue Thorazine to 100 mg PO TID for psychosis/mood stabilization  -Discontinued Haldol 10 mg TID on 8/4 due to lack of effectiveness -Previously discontinued risperidone 2 mg  BID as psychosis was persistent -Continue Cogentin to 1 mg BID PRN for EPS prophylaxis  -Obtain EKG every other day due to being on 2 antipsychotic. Another due on 01/16/23.  Agitation protocol: -Diphenhydramine 50 mg p.o. or injection, 3 times daily as needed for agitation -Continue Thorazine 50 mg TID PRN for agitation - Previously Discontinued Haloperidol 5 mg p.o. or injection, 3 times daily as needed for agitation -Lorazepam 2 mg p.o. or injection, 3 times daily as needed for agitation  #Generalized anxiety  disorder -Continue hydroxyzine 50 mg 3 times daily as needed for anxiety -Continue propranolol 10 mg 2 times daily for anxiety  #Hyperlipidemia -Continue simvastatin 10 mg daily  Insomnia -Continue trazodone 150  mg at bedtime for the management of insomnia  Hypertension -Continue clonidine 0.1 mg every 6 hours as needed for systolic blood pressure greater than 160 or diastolic blood pressure less greater than 100  Low Vitamin D level -Continue Vitamin D 50.000 units weekly  As needed medications: -Patient to continue taking Tylenol 650 mg every 6 hours as needed for mild pain -Patient to continue taking Maalox/Mylanta 30 mL every 4 hours as needed for indigestion -Patient to continue taking Milk of Magnesia 30 mL as needed for mild constipation  Labs reviewed: Ammonia level was found to be within normal limits (<10 umol/L).  Vitamin D level was found to be decreased (20.09 ng/mL), supplementing.  Prolactin level slightly elevated at 34. [name]  Discharge Planning: Social work and case management to assist with discharge planning and identification of hospital follow-up needs prior to discharge Estimated LOS: 5-7 days Discharge Concerns: Need to establish a safety plan; Medication compliance and effectiveness Discharge Goals: Return home with outpatient referrals for mental health follow-up including medication management/psychotherapy   I certify that inpatient services furnished can reasonably be expected to improve the patient's condition.    Cecilie Lowers, FNP  Patient ID: Latrelle Dodrill, female   DOB: 01-20-1996, 27 y.o.   MRN: [id number]  Patient ID: Magalie Midou Hamadou, female   DOB: September 08, 1995, 27 y.o.   MRN: [id number] Patient ID: Brennen Midou Hamadou, female   DOB: [birth year], 27 y.o.   MRN: [id number]

## 2023-01-16 ENCOUNTER — Other Ambulatory Visit: Payer: Self-pay

## 2023-01-16 DIAGNOSIS — F250 Schizoaffective disorder, bipolar type: Secondary | ICD-10-CM | POA: Diagnosis not present

## 2023-01-16 NOTE — Progress Notes (Signed)
Nursing 1:1 note D:Pt observed sleeping in bed with eyes closed. RR even and unlabored. No distress noted. A: 1:1 observation continues for safety  R: pt remains safe  

## 2023-01-16 NOTE — Progress Notes (Signed)
1:1 Note  Patient in the dayroom with peers watching TV. No distress noted. 1:1 monitoring continues. Patient remains safe at this time.

## 2023-01-16 NOTE — Progress Notes (Signed)
[facility] MD Progress Note  01/16/2023 4:59 PM Barbara George  MRN:  [id number]  Reason For Admission: Barbara George is a 27 year old African-American female with a past psychiatric history significant for schizoaffective disorder (bipolar type) who was admitted to [facility] from [facility], ED after presenting to the ED on 7/25 by her husband and father because she stopped eating, stopped talking, and became more withdrawn the day prior.  Per patient's father, history of nonmedication compliance and patient typically after discharge, which always leads to a relapse in her symptoms.  Patient was already receiving transferred back to [facility] under IVC for treatment and stabilization of her mental status.  24-hour chart review: Vital signs WNL with exception of DBP being slightly elevated at 98. Pt given agitation protocol meds earlier today morning at 0500. Remains on 1:1 staff monitoring due to difficulties redirecting her and intrusive nature.   Patient assessment note:  During today's encounter, Pt continues to present with a bizarre affect and irritable mood. Her attention to personal hygiene and grooming is fair, eye contact is good, speech is clear, but incoherent. Thought contents are unorganized and illogical at times. Pt currently denies SI/HI/AVH  and denies paranoia. She is however, continuing to be non linear in her responses to questions. Asked Clinical research associate: "Are you Aissatou? I am not fine with your black magic." This was in response to questions regarding if she slept well last night, and if she is eating well. Staff reports a good sleep last night until 0500 when she got up and became agitated.  No TD/EPS type symptoms found on assessment, and pt denies any feelings of stiffness. AIMS: 0.  EKG WNL. A second antipsychotic and 1:1 staff continue to be necessary at this time as mood remains very labile, and behaviors are unpredictable and can be aggressive. She  needs frequent redirections to stay from other patients' spaces.   Referral placed by CSW for ACT Team and once pt has these services in place, we will move towards discharging her. CSW will also need to coordinate for pt to get a PHP after discharge as requested by her family. Continuing Thorazine and Zyprexa as listed below, and pt will be discharged on both meds, and outpatient provider will have to work towards weaning her down to one antipsychotic med. Both meds are needed at this time as she is on both, but still requiring agitation protocol meds.  Principal Problem: Schizoaffective disorder, bipolar type (HCC) Diagnosis: Principal Problem:   Schizoaffective disorder, bipolar type (HCC) Active Problems:   Hyperlipidemia   Type II diabetes mellitus (HCC)   GAD (generalized anxiety disorder)  Total Time spent with patient: 35 minutes   Past Psychiatric History:  See H&P  Past Medical History:  Past Medical History:  Diagnosis Date   Bipolar 1 disorder (HCC)    Depression    Diabetes mellitus without complication (HCC)    Type 2 (Pre)   Schizophrenia (HCC)    History reviewed. No pertinent surgical history. Family History:  Family History  Problem Relation Age of Onset   Hypertension Father    Family Psychiatric  History:  See H&P  Social History:  Social History   Substance and Sexual Activity  Alcohol Use No   Alcohol/week: 0.0 standard drinks of alcohol     Social History   Substance and Sexual Activity  Drug Use No    Social History   Socioeconomic History   Marital status: Married    Spouse  name: Amadou   Number of children: Not on file   Years of education: Not on file   Highest education level: Associate degree: occupational, technical, or vocational program  Occupational History   Occupation: unemployed  Tobacco Use   Smoking status: Never   Smokeless tobacco: Never  Vaping Use   Vaping status: Never Used  Substance and Sexual Activity    Alcohol use: No    Alcohol/week: 0.0 standard drinks of alcohol   Drug use: No   Sexual activity: Yes    Birth control/protection: None  Other Topics Concern   Not on file  Social History Narrative   ** Merged History Encounter **       Social Determinants of Health   Financial Resource Strain: Not on file  Food Insecurity: Patient Declined (12/28/2022)   Hunger Vital Sign    Worried About Running Out of Food in the Last Year: Patient declined    Ran Out of Food in the Last Year: Patient declined  Transportation Needs: Patient Declined (12/28/2022)   PRAPARE - Administrator, Civil Service (Medical): Patient declined    Lack of Transportation (Non-Medical): Patient declined  Physical Activity: Inactive (04/03/2019)   Exercise Vital Sign    Days of Exercise per Week: 0 days    Minutes of Exercise per Session: 0 min  Stress: Not on file  Social Connections: Socially Integrated (04/03/2019)   Social Connection and Isolation Panel [NHANES]    Frequency of Communication with Friends and Family: More than three times a week    Frequency of Social Gatherings with Friends and Family: Once a week    Attends Religious Services: More than 4 times per year    Active Member of [employer] or Organizations: Yes    Attends Engineer, structural: Not on file    Marital Status: Married   Sleep: Unable to assess  Appetite: Unable to assess  Current Medications: Current Facility-Administered Medications  Medication Dose Route Frequency Provider Last Rate Last Admin   acetaminophen (TYLENOL) tablet 650 mg  650 mg Oral Q6H PRN Massengill, Harrold Donath, MD   650 mg at 01/15/23 0333   alum & mag hydroxide-simeth (MAALOX/MYLANTA) 200-200-20 MG/5ML suspension 30 mL  30 mL Oral Q4H PRN Massengill, Harrold Donath, MD       benztropine (COGENTIN) tablet 1 mg  1 mg Oral BID PRN Starleen Blue, NP       Or   benztropine mesylate (COGENTIN) injection 1 mg  1 mg Intramuscular BID PRN Starleen Blue, NP        chlorproMAZINE (THORAZINE) tablet 100 mg  100 mg Oral TID Starleen Blue, NP   100 mg at 01/16/23 1308   chlorproMAZINE (THORAZINE) tablet 50 mg  50 mg Oral TID PRN Phineas Inches, MD   50 mg at 01/15/23 0502   cloNIDine (CATAPRES) tablet 0.1 mg  0.1 mg Oral Q6H PRN Abbott Pao, Nadir, MD       diphenhydrAMINE (BENADRYL) capsule 50 mg  50 mg Oral TID PRN Phineas Inches, MD   50 mg at 01/15/23 0502   Or   diphenhydrAMINE (BENADRYL) injection 50 mg  50 mg Intramuscular TID PRN Phineas Inches, MD   50 mg at 01/13/23 0515   hydrOXYzine (ATARAX) tablet 50 mg  50 mg Oral TID PRN Phineas Inches, MD   50 mg at 01/15/23 0917   LORazepam (ATIVAN) tablet 2 mg  2 mg Oral TID PRN Phineas Inches, MD   2 mg at 01/15/23 0503  Or   LORazepam (ATIVAN) injection 2 mg  2 mg Intramuscular TID PRN Massengill, Harrold Donath, MD   2 mg at 01/13/23 0515   magnesium hydroxide (MILK OF MAGNESIA) suspension 30 mL  30 mL Oral Daily PRN Massengill, Harrold Donath, MD       nicotine polacrilex (NICORETTE) gum 2 mg  2 mg Oral PRN Starleen Blue, NP   2 mg at 01/15/23 0916   OLANZapine zydis (ZYPREXA) disintegrating tablet 10 mg  10 mg Oral QHS Ntuen, Tina C, FNP   10 mg at 01/15/23 2059   propranolol (INDERAL) tablet 10 mg  10 mg Oral BID Starleen Blue, NP   10 mg at 01/16/23 0755   simvastatin (ZOCOR) tablet 10 mg  10 mg Oral q1800 Starleen Blue, NP   10 mg at 01/15/23 2058   Vitamin D (Ergocalciferol) (DRISDOL) 1.25 MG (50000 UNIT) capsule 50,000 Units  50,000 Units Oral Q7 days Starleen Blue, NP   50,000 Units at 01/15/23 1610    Lab Results:  No results found for this or any previous visit (from the past 48 hour(s)).    Blood Alcohol level:  Lab Results  Component Value Date   ETH <10 12/27/2022   ETH <10 12/11/2022    Metabolic Disorder Labs: Lab Results  Component Value Date   HGBA1C 6.2 (H) 12/11/2022   MPG 131 12/11/2022   MPG 111.15 10/23/2018   Lab Results  Component Value Date   PROLACTIN  34.3 (H) 12/30/2022   PROLACTIN 13.0 12/11/2022   Lab Results  Component Value Date   CHOL 137 01/05/2023   TRIG 62 01/05/2023   HDL 62 01/05/2023   CHOLHDL 2.2 01/05/2023   VLDL 12 01/05/2023   LDLCALC 63 01/05/2023   LDLCALC 113 (H) 12/11/2022   Physical Findings: AIMS: 0Overall Severity Severity of abnormal movements (highest score from questions above): None, normal Incapacitation due to abnormal movements: None, normal Patient's awareness of abnormal movements (rate only patient's report): No Awareness, Dental Status Current problems with teeth and/or dentures?: No Does patient usually wear dentures?: No  CIWA:    COWS:     Musculoskeletal: Strength & Muscle Tone: within normal limits Gait & Station: normal Patient leans: N/A  Psychiatric Specialty Exam:  Presentation  General Appearance:  Casual  Eye Contact: Fair  Speech: Clear and Coherent  Speech Volume: Normal  Handedness: Right   Mood and Affect  Mood: Good  Affect: Labile, inappropriate  Thought Process  Thought Processes: Disorganized  Descriptions of Associations:Tangential  Orientation:Partial  Thought Content:Illogical  History of Schizophrenia/Schizoaffective disorder:Yes  Duration of Psychotic Symptoms:Greater than six months  Hallucinations:Couldn't assess  Ideas of Reference:None  Suicidal Thoughts:Suicidal Thoughts: No    Homicidal Thoughts:Homicidal Thoughts: No    Sensorium  Memory: Couldn't assess  Judgment: Poor  Insight: Poor   Executive Functions  Concentration: Fair  Attention Span: Poor  Recall: Poor  Fund of Knowledge: Poor  Language: Poor   Psychomotor Activity  Psychomotor Activity: Psychomotor Activity: Normal     Assets  Assets: Resilience   Sleep  Sleep: Sleep: Fair Number of Hours of Sleep: 7    Physical Exam: Constitutional:      Appearance: Normal appearance.  HENT:     Head: Normocephalic and  atraumatic.  Eyes:     Extraocular Movements: Extraocular movements intact.  Cardiovascular:     Rate and Rhythm: Normal rate and regular rhythm.  Pulmonary:     Effort: Pulmonary effort is normal.   Musculoskeletal:  General: Normal range of motion.     Cervical back: Normal range of motion.  Skin:    General: Skin is warm and dry.   Review of Systems  Constitutional: Negative.   HENT: Negative.    Eyes: Negative.   Respiratory: Negative.    Cardiovascular: Negative.   Gastrointestinal: Negative.   Skin: Negative.   Neurological: Negative.  Blood pressure (!) 132/98, pulse 86, temperature 98.2 F (36.8 C), temperature source Oral, resp. rate 18, height 5\' 8"  (1.727 m), weight 101.2 kg, SpO2 100%. Body mass index is 33.91 kg/m.   Treatment Plan Summary: Daily contact with patient to assess and evaluate symptoms and progress in treatment and Medication management  Plan:  Patient exhibited thought blocking and was mostly mute during most of the assessment.  When patient was able to communicate, patient provided bizarre responses to the questions addressed to her.  Patient to remain on her current psychiatric medication regimen.  Patient to continue to be evaluated and assessed to determine efficacy of treatment.  Safety and Monitoring: Voluntary admission to inpatient psychiatric unit for safety, stabilization and treatment Daily contact with patient to assess and evaluate symptoms and progress in treatment Patient's case to be discussed in multi-disciplinary team meeting Observation Level : q15 minute checks Vital signs: q12 hours Precautions: Safety  Diagnosis: Principal Problem:   Schizoaffective disorder, bipolar type (HCC) Active Problems:   Hyperlipidemia   Type II diabetes mellitus (HCC)   GAD (generalized anxiety disorder)  #Schizoaffective disorder (bipolar type) -Continue Zyprexa 5 mg BID for psychosis  -Continue Thorazine to 100 mg PO TID for  psychosis/mood stabilization  -Discontinued Haldol 10 mg TID on 8/4 due to lack of effectiveness -Previously discontinued risperidone 2 mg  BID as psychosis was persistent -Continue Cogentin 1 mg BID PRN for EPS prophylaxis   Agitation protocol: -Diphenhydramine 50 mg p.o. or injection, 3 times daily as needed for agitation -Continue Thorazine 50 mg TID PRN for agitation - Previously Discontinued Haloperidol 5 mg p.o. or injection, 3 times daily as needed for agitation -Lorazepam 2 mg p.o. or injection, 3 times daily as needed for agitation  #Generalized anxiety disorder -Continue hydroxyzine 50 mg 3 times daily as needed for anxiety -Continue propranolol 10 mg 2 times daily for anxiety  #Hyperlipidemia -Continue simvastatin 10 mg daily  Insomnia -Continue trazodone 150 mg at bedtime for the management of insomnia  Hypertension -Continue clonidine 0.1 mg every 6 hours as needed for systolic blood pressure greater than 160 or diastolic blood pressure less greater than 100  Low Vitamin D level -Continue Vitamin D 50.000 units weekly  As needed medications: -Patient to continue taking Tylenol 650 mg every 6 hours as needed for mild pain -Patient to continue taking Maalox/Mylanta 30 mL every 4 hours as needed for indigestion -Patient to continue taking Milk of Magnesia 30 mL as needed for mild constipation  Labs reviewed: Ammonia level was found to be within normal limits (<10 umol/L).  Vitamin D level was found to be decreased (20.09 ng/mL), supple.menting.  Prolactin level slightly elevated at 34. [name]  Discharge Planning: Social work and case management to assist with discharge planning and identification of hospital follow-up needs prior to discharge Estimated LOS: 5-7 days Discharge Concerns: Need to establish a safety plan; Medication compliance and effectiveness Discharge Goals: Return home with outpatient referrals for mental health follow-up including medication  management/psychotherapy   I certify that inpatient services furnished can reasonably be expected to improve the patient's condition.  Starleen Blue, NP  Patient ID: Barbara George, female   DOB: 1995/08/27, 27 y.o.   MRN: [id number]  Patient ID: Barbara George, female   DOB: 1996/01/28, 27 y.o.   MRN: [id number]

## 2023-01-16 NOTE — Progress Notes (Signed)
Nursing 1:1 note D:Pt observed sitting in bed with eyes open. RR even and unlabored. No distress noted. A: 1:1 observation continues for safety  R: pt remains safe  

## 2023-01-16 NOTE — Progress Notes (Signed)
Pt continues to be child-like and attention seeking , but no behavior issues noted this evening    01/16/23 2045  Psych Admission Type (Psych Patients Only)  Admission Status Involuntary  Psychosocial Assessment  Patient Complaints Hyperactivity  Eye Contact Fair  Facial Expression Anxious  Affect Anxious;Labile  Speech Logical/coherent  Interaction Childlike;Attention-seeking  Motor Activity Restless  Appearance/Hygiene Layered clothes  Behavior Characteristics Cooperative;Fidgety  Mood Suspicious;Anxious  Aggressive Behavior  Effect No apparent injury  Thought Process  Coherency Circumstantial;Disorganized  Content Blaming others;Preoccupation  Delusions None reported or observed  Perception WDL  Hallucination None reported or observed  Judgment Poor  Confusion None  Danger to Self  Current suicidal ideation? Denies  Danger to Others  Danger to Others None reported or observed

## 2023-01-16 NOTE — Plan of Care (Signed)
  Problem: Education: Goal: Mental status will improve Outcome: Progressing   Problem: Activity: Goal: Interest or engagement in activities will improve Outcome: Progressing Goal: Sleeping patterns will improve Outcome: Progressing   Problem: Safety: Goal: Periods of time without injury will increase Outcome: Progressing

## 2023-01-16 NOTE — Progress Notes (Signed)
1:1 Note:  Patient walking hallway and then returned to her room to shower. Patient easily redirectable. No distress noted. Patient remains safe at this time.

## 2023-01-16 NOTE — [facility] Counselor (Signed)
[facility] LCSW Progress Note   01/16/2023    10:39 AM  Barbara George      Type of Note: CSW Visit with Patient   CSW went to speak with patient this morning about speaking with her brother yesterday and shared that once she is DC he will pick her up. Also shared with her that a referral was made on her behalf for an ACT Team and patient stated, " that is fine, they can do what ever they want". However, while talking to patient she was  responding rudely then she stated, " so when am I leaving stop being confusing ". CSW just reiterated to patient that once the doctor feels  she is ready she will DC back with brother.     Signed:   Jacob Moores, MSW, [facility] 01/16/2023 10:39 AM

## 2023-01-16 NOTE — [facility] Group Notes (Signed)
Adult Psychoeducational Group Note  Date:  01/16/2023 Time:  9:39 PM  Group Topic/Focus:  Wrap-Up Group:   The focus of this group is to help patients review their daily goal of treatment and discuss progress on daily workbooks.  Participation Level:  Active  Participation Quality:  Appropriate  Affect:  Appropriate  Cognitive:  Appropriate  Insight: Appropriate  Engagement in Group:  Engaged  Modes of Intervention:  Discussion  Additional Comments:  Pt attended group,  Joselyn Arrow 01/16/2023, 9:39 PM

## 2023-01-16 NOTE — Plan of Care (Signed)
  Problem: Education: Goal: Emotional status will improve Outcome: Not Progressing Goal: Mental status will improve Outcome: Not Progressing   Problem: Coping: Goal: Ability to verbalize frustrations and anger appropriately will improve Outcome: Not Progressing Goal: Ability to demonstrate self-control will improve Outcome: Not Progressing   

## 2023-01-16 NOTE — Progress Notes (Signed)
Patient in her room. No distress noted. 1:1 monitoring continues. Patient remains safe at this time.

## 2023-01-17 DIAGNOSIS — F250 Schizoaffective disorder, bipolar type: Secondary | ICD-10-CM | POA: Diagnosis not present

## 2023-01-17 NOTE — Progress Notes (Signed)
Nursing 1:1 Note  Pt observed to be in dayroom for group. Pt does not appear to be in distress/no safety concerns noted at this time.

## 2023-01-17 NOTE — Progress Notes (Signed)
Nursing 1:1 note D:Pt observed sleeping in bed with eyes closed. RR even and unlabored. No distress noted. A: 1:1 observation continues for safety  R: pt remains safe  

## 2023-01-17 NOTE — Progress Notes (Signed)
   01/17/23 0900  Psych Admission Type (Psych Patients Only)  Admission Status Involuntary  Psychosocial Assessment  Eye Contact Staring  Facial Expression Animated  Affect Labile  Speech Logical/coherent  Interaction Childlike;Attention-seeking  Motor Activity Restless  Appearance/Hygiene Layered clothes  Behavior Characteristics Cooperative;Fidgety  Mood Suspicious  Thought Process  Coherency Circumstantial;Disorganized  Content Blaming others;Preoccupation  Delusions None reported or observed  Perception WDL  Hallucination None reported or observed  Judgment Poor  Confusion None  Danger to Self  Current suicidal ideation? Denies  Agreement Not to Harm Self Yes  Description of Agreement verbal  Danger to Others  Danger to Others None reported or observed

## 2023-01-17 NOTE — Progress Notes (Signed)
Pt up singing trying to wake up other patients, pt requesting PRN medication, pt given PRN Vistaril per [facility]

## 2023-01-17 NOTE — Progress Notes (Signed)
[facility] MD Progress Note  01/17/2023 2:34 PM Barbara George  MRN:  [id number]  Reason For Admission: Barbara George is a 27 year old African-American female with a past psychiatric history significant for schizoaffective disorder (bipolar type) who was admitted to [facility] from [facility], ED after presenting to the ED on 7/25 by her husband and father because she stopped eating, stopped talking, and became more withdrawn the day prior.  Per patient's father, history of nonmedication compliance and patient typically after discharge, which always leads to a relapse in her symptoms.  Patient was already receiving transferred back to [facility] under [facility] for treatment and stabilization of her mental status.  Yesterday the psychiatry team made the following recommendations:  -Continue Zyprexa 5 mg BID for psychosis  -Continue Thorazine to 100 mg PO TID for psychosis/mood stabilization  -Discontinued Haldol 10 mg TID on 8/4 due to lack of effectiveness -Previously discontinued risperidone 2 mg  BID as psychosis was persistent -Continue Cogentin 1 mg BID PRN for EPS prophylaxis   On assessment today, the pt reports that her mood is euthymic & pleasant although patient has propensity to be agitated. Denies feeling down, depressed, or sad.  She is alert, oriented to person, place, time and situation.  Vital signs within normal limits, blood pressure 125/79, pulse 83. Reports that anxiety symptoms are at manageable level.  She remains on 1:1 monitoring due to her intrusive nature.  Sleep is stable.  Nursing staff report patient sleeping about 8 hours last night. Appetite is stable.   Concentration: Fair, patient wanted to ask this provider questions rather than answer assessment questions asked.  She was easily redirected.  Patient has been observed to play these games with this provider during assessment.  Energy level is adequate. Denies having any suicidal thoughts.  Denies having any suicidal intent or plan.  Denies having any HI.  Denies having psychotic symptoms.   Denies having side effects to current psychiatric medications.   No TD/EPS type symptoms found on assessment, and pt denies any feelings of stiffness. AIMS: 0.  EKG due to a second antipsychotic WNL. Continues 1:1 staff monitoring due to mood liability and intrusive nature.    Discussed discharge planning: Informed patient about referral placed by LCSW for ACT Team and once pt has these services in place, we will move towards discharging her.  LCSW will also need to coordinate for pt to get a PHP after discharge as requested by her family. Continuing Thorazine and Zyprexa as listed below, and pt will be discharged on both meds, and outpatient provider will have to work towards weaning her down to one antipsychotic med. Both meds are needed at this time for better management of her psychosis, but still requiring agitation protocol meds occasionally.  Principal Problem: Schizoaffective disorder, bipolar type (HCC) Diagnosis: Principal Problem:   Schizoaffective disorder, bipolar type (HCC) Active Problems:   Hyperlipidemia   Type II diabetes mellitus (HCC)   GAD (generalized anxiety disorder)  Total Time spent with patient: 35 minutes   Past Psychiatric History:  See H&P  Past Medical History:  Past Medical History:  Diagnosis Date   Bipolar 1 disorder (HCC)    Depression    Diabetes mellitus without complication (HCC)    Type 2 (Pre)   Schizophrenia (HCC)    History reviewed. No pertinent surgical history. Family History:  Family History  Problem Relation Age of Onset   Hypertension Father    Family Psychiatric  History:  See H&P  Social History:  Social History   Substance and Sexual Activity  Alcohol Use No   Alcohol/week: 0.0 standard drinks of alcohol     Social History   Substance and Sexual Activity  Drug Use No    Social History   Socioeconomic History    Marital status: Married    Spouse name: Amadou   Number of children: Not on file   Years of education: Not on file   Highest education level: Associate degree: occupational, Scientist, product/process development, or vocational program  Occupational History   Occupation: unemployed  Tobacco Use   Smoking status: Never   Smokeless tobacco: Never  Vaping Use   Vaping status: Never Used  Substance and Sexual Activity   Alcohol use: No    Alcohol/week: 0.0 standard drinks of alcohol   Drug use: No   Sexual activity: Yes    Birth control/protection: None  Other Topics Concern   Not on file  Social History Narrative   ** Merged History Encounter **       Social Determinants of Health   Financial Resource Strain: Not on file  Food Insecurity: Patient Declined (12/28/2022)   Hunger Vital Sign    Worried About Running Out of Food in the Last Year: Patient declined    Ran Out of Food in the Last Year: Patient declined  Transportation Needs: Patient Declined (12/28/2022)   PRAPARE - Administrator, Civil Service (Medical): Patient declined    Lack of Transportation (Non-Medical): Patient declined  Physical Activity: Inactive (04/03/2019)   Exercise Vital Sign    Days of Exercise per Week: 0 days    Minutes of Exercise per Session: 0 min  Stress: Not on file  Social Connections: Socially Integrated (04/03/2019)   Social Connection and Isolation Panel [NHANES]    Frequency of Communication with Friends and Family: More than three times a week    Frequency of Social Gatherings with Friends and Family: Once a week    Attends Religious Services: More than 4 times per year    Active Member of [employer] or Organizations: Yes    Attends Engineer, structural: Not on file    Marital Status: Married   Sleep: Unable to assess  Appetite: Unable to assess  Current Medications: Current Facility-Administered Medications  Medication Dose Route Frequency Provider Last Rate Last Admin   acetaminophen  (TYLENOL) tablet 650 mg  650 mg Oral Q6H PRN Massengill, Harrold Donath, MD   650 mg at 01/15/23 0333   alum & mag hydroxide-simeth (MAALOX/MYLANTA) 200-200-20 MG/5ML suspension 30 mL  30 mL Oral Q4H PRN Massengill, Harrold Donath, MD       benztropine (COGENTIN) tablet 1 mg  1 mg Oral BID PRN Starleen Blue, NP       Or   benztropine mesylate (COGENTIN) injection 1 mg  1 mg Intramuscular BID PRN Starleen Blue, NP       chlorproMAZINE (THORAZINE) tablet 100 mg  100 mg Oral TID Starleen Blue, NP   100 mg at 01/17/23 1413   chlorproMAZINE (THORAZINE) tablet 50 mg  50 mg Oral TID PRN Phineas Inches, MD   50 mg at 01/15/23 0502   cloNIDine (CATAPRES) tablet 0.1 mg  0.1 mg Oral Q6H PRN Abbott Pao, Nadir, MD       diphenhydrAMINE (BENADRYL) capsule 50 mg  50 mg Oral TID PRN Phineas Inches, MD   50 mg at 01/15/23 0502   Or   diphenhydrAMINE (BENADRYL) injection 50 mg  50 mg Intramuscular TID PRN  Phineas Inches, MD   50 mg at 01/13/23 0515   hydrOXYzine (ATARAX) tablet 50 mg  50 mg Oral TID PRN Phineas Inches, MD   50 mg at 01/17/23 0129   LORazepam (ATIVAN) tablet 2 mg  2 mg Oral TID PRN Phineas Inches, MD   2 mg at 01/15/23 0503   Or   LORazepam (ATIVAN) injection 2 mg  2 mg Intramuscular TID PRN Phineas Inches, MD   2 mg at 01/13/23 0515   magnesium hydroxide (MILK OF MAGNESIA) suspension 30 mL  30 mL Oral Daily PRN Massengill, Harrold Donath, MD       nicotine polacrilex (NICORETTE) gum 2 mg  2 mg Oral PRN Starleen Blue, NP   2 mg at 01/15/23 0916   OLANZapine zydis (ZYPREXA) disintegrating tablet 10 mg  10 mg Oral QHS , Inetta Fermo C, FNP   10 mg at 01/16/23 2035   propranolol (INDERAL) tablet 10 mg  10 mg Oral BID Starleen Blue, NP   10 mg at 01/17/23 0755   simvastatin (ZOCOR) tablet 10 mg  10 mg Oral q1800 Starleen Blue, NP   10 mg at 01/16/23 1703   Vitamin D (Ergocalciferol) (DRISDOL) 1.25 MG (50000 UNIT) capsule 50,000 Units  50,000 Units Oral Q7 days Starleen Blue, NP   50,000 Units at  01/15/23 4098   Lab Results:  No results found for this or any previous visit (from the past 48 hour(s)).  Blood Alcohol level:  Lab Results  Component Value Date   ETH <10 12/27/2022   ETH <10 12/11/2022    Metabolic Disorder Labs: Lab Results  Component Value Date   HGBA1C 6.2 (H) 12/11/2022   MPG 131 12/11/2022   MPG 111.15 10/23/2018   Lab Results  Component Value Date   PROLACTIN 34.3 (H) 12/30/2022   PROLACTIN 13.0 12/11/2022   Lab Results  Component Value Date   CHOL 137 01/05/2023   TRIG 62 01/05/2023   HDL 62 01/05/2023   CHOLHDL 2.2 01/05/2023   VLDL 12 01/05/2023   LDLCALC 63 01/05/2023   LDLCALC 113 (H) 12/11/2022   Physical Findings: AIMS: 0Overall Severity Severity of abnormal movements (highest score from questions above): None, normal Incapacitation due to abnormal movements: None, normal Patient's awareness of abnormal movements (rate only patient's report): No Awareness, Dental Status Current problems with teeth and/or dentures?: No Does patient usually wear dentures?: No  CIWA:    COWS:     Musculoskeletal: Strength & Muscle Tone: within normal limits Gait & Station: normal Patient leans: N/A  Psychiatric Specialty Exam:  Presentation  General Appearance:  Appropriate for Environment; Casual  Eye Contact: Fair  Speech: Clear and Coherent  Speech Volume: Normal  Handedness: Right  Mood and Affect  Mood: Good  Affect: Labile, inappropriate  Thought Process  Thought Processes: Disorganized  Descriptions of Associations:Tangential  Orientation:Full (Time, Place and Person)  Thought Content:Illogical; Tangential  History of Schizophrenia/Schizoaffective disorder:Yes  Duration of Psychotic Symptoms:Greater than six months  Hallucinations:Couldn't assess  Ideas of Reference:None  Suicidal Thoughts:Suicidal Thoughts: No  Homicidal Thoughts:Homicidal Thoughts: No  Sensorium  Memory: Couldn't  assess  Judgment: Fair  Insight: Shallow  Executive Functions  Concentration: Fair  Attention Span: Fair  Recall: Poor  Fund of Knowledge: Fair  Language: Fair  Psychomotor Activity  Psychomotor Activity: Psychomotor Activity: Normal  Assets  Assets: Resilience; Manufacturing systems engineer; Housing; Physical Health  Sleep  Sleep: Sleep: Good Number of Hours of Sleep: 8  Physical Exam: Constitutional:  Appearance: Normal appearance.  HENT:     Head: Normocephalic and atraumatic.  Eyes:     Extraocular Movements: Extraocular movements intact.  Cardiovascular:     Rate and Rhythm: Normal rate and regular rhythm.  Pulmonary:     Effort: Pulmonary effort is normal.   Musculoskeletal:        General: Normal range of motion.     Cervical back: Normal range of motion.  Skin:    General: Skin is warm and dry.   Review of Systems  Constitutional: Negative.   HENT: Negative.    Eyes: Negative.   Respiratory: Negative.    Cardiovascular: Negative.   Gastrointestinal: Negative.   Skin: Negative.   Neurological: Negative.  Blood pressure 125/79, pulse 83, temperature 98.2 F (36.8 C), temperature source Oral, resp. rate 18, height 5\' 8"  (1.727 m), weight 101.2 kg, SpO2 100%. Body mass index is 33.91 kg/m.  Treatment Plan Summary: Daily contact with patient to assess and evaluate symptoms and progress in treatment and Medication management  Plan:  Patient exhibited thought blocking and was mostly mute during most of the assessment.  When patient was able to communicate, patient provided bizarre responses to the questions addressed to her.  Patient to remain on her current psychiatric medication regimen.  Patient to continue to be evaluated and assessed to determine efficacy of treatment.  Safety and Monitoring: Voluntary admission to inpatient psychiatric unit for safety, stabilization and treatment Daily contact with patient to assess and evaluate symptoms  and progress in treatment Patient's case to be discussed in multi-disciplinary team meeting Observation Level : q15 minute checks Vital signs: q12 hours Precautions: Safety  Diagnosis: Principal Problem:   Schizoaffective disorder, bipolar type (HCC) Active Problems:   Hyperlipidemia   Type II diabetes mellitus (HCC)   GAD (generalized anxiety disorder)  #Schizoaffective disorder (bipolar type) -Continue Zyprexa 5 mg BID for psychosis  -Continue Thorazine to 100 mg PO TID for psychosis/mood stabilization  -Discontinued Haldol 10 mg TID on 8/4 due to lack of effectiveness -Previously discontinued risperidone 2 mg  BID as psychosis was persistent -Continue Cogentin 1 mg BID PRN for EPS prophylaxis   Agitation protocol: -Diphenhydramine 50 mg p.o. or injection, 3 times daily as needed for agitation -Continue Thorazine 50 mg TID PRN for agitation - Previously Discontinued Haloperidol 5 mg p.o. or injection, 3 times daily as needed for agitation -Lorazepam 2 mg p.o. or injection, 3 times daily as needed for agitation  #Generalized anxiety disorder -Continue hydroxyzine 50 mg 3 times daily as needed for anxiety -Continue propranolol 10 mg 2 times daily for anxiety  #Hyperlipidemia -Continue simvastatin 10 mg daily  Insomnia -Continue trazodone 150 mg at bedtime for the management of insomnia  Hypertension -Continue clonidine 0.1 mg every 6 hours as needed for systolic blood pressure greater than 160 or diastolic blood pressure less greater than 100  Low Vitamin D level -Continue Vitamin D 50.000 units weekly  As needed medications: -Patient to continue taking Tylenol 650 mg every 6 hours as needed for mild pain -Patient to continue taking Maalox/Mylanta 30 mL every 4 hours as needed for indigestion -Patient to continue taking Milk of Magnesia 30 mL as needed for mild constipation  Labs reviewed: Ammonia level was found to be within normal limits (<10 umol/L).  Vitamin D  level was found to be decreased (20.09 ng/mL), supple.menting.  Prolactin level slightly elevated at 34. [name]  Discharge Planning: Social work and case management to assist with discharge planning and identification  of hospital follow-up needs prior to discharge Estimated LOS: 5-7 days Discharge Concerns: Need to establish a safety plan; Medication compliance and effectiveness Discharge Goals: Return home with outpatient referrals for mental health follow-up including medication management/psychotherapy   I certify that inpatient services furnished can reasonably be expected to improve the patient's condition.    Cecilie Lowers, FNP  Patient ID: Barbara George, female   DOB: 18-Jun-1995, 27 y.o.   MRN: [id number] Patient ID: Barbara George, female   DOB: [birth year], 27 y.o.   MRN: [id number] Patient ID: Barbara George, female   DOB: [birth year], 27 y.o.   MRN: [id number]

## 2023-01-17 NOTE — Group Note (Signed)
Occupational Therapy Group Note  Group Topic: Sleep Hygiene  Group Date: 01/17/2023 Start Time: 1415 End Time: 1450 Facilitators: Ted Mcalpine, OT   Group Description: Group encouraged increased participation and engagement through topic focused on sleep hygiene. Patients reflected on the quality of sleep they typically receive and identified areas that need improvement. Group was given background information on sleep and sleep hygiene, including common sleep disorders. Group members also received information on how to improve one's sleep and introduced a sleep diary as a tool that can be utilized to track sleep quality over a length of time. Group session ended with patients identifying one or more strategies they could utilize or implement into their sleep routine in order to improve overall sleep quality.        Therapeutic Goal(s):  Identify one or more strategies to improve overall sleep hygiene  Identify one or more areas of sleep that are negatively impacted (sleep too much, too little, etc)     Participation Level: Engaged   Participation Quality: Independent   Behavior: Appropriate   Speech/Thought Process: Loose association    Affect/Mood: Appropriate   Insight: Limited   Judgement: Limited      Modes of Intervention: Education  Patient Response to Interventions:  Attentive   Plan: Continue to engage patient in OT groups 2 - 3x/week.  01/17/2023  Ted Mcalpine, OT  Kerrin Champagne, OT

## 2023-01-17 NOTE — Progress Notes (Signed)
1:1 Note  Pt observed sitting in dayroom. No sign of distress or safety concerns noted at this time.

## 2023-01-17 NOTE — Progress Notes (Signed)
Nursing 1:1 note D:Pt observed lying in bed with eyes open, PRN Vistaril appeared to help pt calm down . RR even and unlabored. No distress noted. A: 1:1 observation continues for safety  R: pt remains safe

## 2023-01-17 NOTE — Group Note (Signed)
Date:  01/21/2023 Time:  12:29 PM  Group Topic/Focus:  Goals Group:   The focus of this group is to help patients establish daily goals to achieve during treatment and discuss how the patient can incorporate goal setting into their daily lives to aide in recovery. Orientation:   The focus of this group is to educate the patient on the purpose and policies of crisis stabilization and provide a format to answer questions about their admission.  The group details unit policies and expectations of patients while admitted.    Participation Level:  Active  Participation Quality:  Appropriate  Affect:  Appropriate  Cognitive:  Appropriate  Insight: Appropriate  Engagement in Group:  Engaged  Modes of Intervention:  Discussion  Additional Comments:  Patient attended group and was attentive the duration of it.  Ashaunti Treptow T Lorraine Lax 01/21/2023, 12:29 PM

## 2023-01-17 NOTE — [facility] Counselor (Signed)
[facility] LCSW Progress Note   01/17/2023    3:38 PM  Barbara George      Type of Note: [employer]   CSW spoke with Genta intake coordinator who shared that she reviewed patient referral and she meets requirements for services; Genta stated that she will follow up with her on Monday to get her established with ACT services.      Signed:   Jacob Moores, MSW, [facility] 01/17/2023 3:38 PM

## 2023-01-17 NOTE — Progress Notes (Signed)
1:1 Nursing Note  1800: Pt observed at medication window. Pt calm, cooperative with RN. Compliant with medication. No distress/safety concerns noted at this time.

## 2023-01-17 NOTE — Progress Notes (Signed)
   01/17/23 2000  Psych Admission Type (Psych Patients Only)  Admission Status Involuntary  Psychosocial Assessment  Patient Complaints Hyperactivity;Restlessness  Eye Contact Fair  Facial Expression Anxious  Affect Anxious;Labile  Speech Logical/coherent  Interaction Childlike;Attention-seeking  Motor Activity Restless  Appearance/Hygiene Layered clothes  Behavior Characteristics Fidgety  Mood Suspicious  Aggressive Behavior  Effect No apparent injury  Thought Process  Coherency Circumstantial;Disorganized  Content Blaming others;Preoccupation  Delusions None reported or observed  Perception WDL  Hallucination None reported or observed  Judgment Poor  Confusion None  Danger to Self  Current suicidal ideation? Denies  Danger to Others  Danger to Others None reported or observed

## 2023-01-17 NOTE — Plan of Care (Signed)
  Problem: Education: Goal: Emotional status will improve Outcome: Progressing   Problem: Activity: Goal: Interest or engagement in activities will improve Outcome: Progressing Goal: Sleeping patterns will improve Outcome: Progressing   Problem: Coping: Goal: Will verbalize feelings Outcome: Progressing   Problem: Health Behavior/Discharge Planning: Goal: Compliance with prescribed medication regimen will improve Outcome: Progressing

## 2023-01-17 NOTE — Group Note (Signed)
Date:  01/17/2023 Time:  9:07 PM Pt was invited but did not attend wrap up group this evening.   Chrisandra Netters 01/17/2023, 9:07 PM

## 2023-01-18 ENCOUNTER — Encounter (HOSPITAL_COMMUNITY): Payer: Self-pay

## 2023-01-18 MED ORDER — CHLORPROMAZINE HCL 100 MG PO TABS: 100.0000 mg | ORAL_TABLET | Freq: Three times a day (TID) | ORAL | 0 refills | Status: AC

## 2023-01-18 MED ORDER — OLANZAPINE 10 MG PO TBDP: 10.0000 mg | ORAL_TABLET | Freq: Every day | ORAL | 0 refills | Status: AC

## 2023-01-18 MED ORDER — NICOTINE POLACRILEX 2 MG MT GUM: 2.0000 mg | CHEWING_GUM | OROMUCOSAL | 0 refills | Status: AC | PRN

## 2023-01-18 MED ORDER — CHLORPROMAZINE HCL 50 MG PO TABS: 50.0000 mg | ORAL_TABLET | Freq: Three times a day (TID) | ORAL | 0 refills | Status: AC | PRN

## 2023-01-18 MED ORDER — VITAMIN D (ERGOCALCIFEROL) 1.25 MG (50000 UNIT) PO CAPS: 50000.0000 [IU] | ORAL_CAPSULE | ORAL | 0 refills | Status: AC

## 2023-01-18 MED ORDER — CLONIDINE HCL 0.1 MG PO TABS: 0.1000 mg | ORAL_TABLET | Freq: Four times a day (QID) | ORAL | 11 refills | Status: AC | PRN

## 2023-01-18 MED ORDER — PROPRANOLOL HCL 10 MG PO TABS: 10.0000 mg | ORAL_TABLET | Freq: Two times a day (BID) | ORAL | 0 refills | Status: AC

## 2023-01-18 MED ORDER — BENZTROPINE MESYLATE 1 MG PO TABS: 1.0000 mg | ORAL_TABLET | Freq: Two times a day (BID) | ORAL | 0 refills | Status: AC | PRN

## 2023-01-18 NOTE — [facility] Group Notes (Signed)
Spiritual care group facilitated by Chaplain Dyanne Carrel, [facility] and Arlyce Dice, M[facility]  Group focused on topic of strength. Group members reflected on what thoughts and feelings emerge when they hear this topic. They then engaged in facilitated dialog around how strength is present in their lives. This dialog focused on representing what strength had been to them in their lives (images and patterns given) and what they saw as helpful in their life now (what they needed / wanted).  Activity drew on narrative framework.  Patient Progress: Barbara George attended group and actively engaged and participated in group conversation and activities.  Her faith is very important to her and she shared that she draws her strength from God.

## 2023-01-18 NOTE — Discharge Summary (Signed)
Physician Discharge Summary Note  Patient:  Barbara George is an 27 y.o., female MRN:  [id number] DOB:  12/01/1995 Patient phone:  [phone] (home)  Patient address:   [address]  [place] [address],  Total Time spent with patient: 30 minutes  Date of Admission:  12/28/2022 Date of Discharge:   01/18/2023  Reason for Admission:  Barbara George is a 70 yo AAF with prior mental health diagnosis of Schizoaffective d/o who was recently treated at this [facility] from 7/09 through 12/21/22. Pt was taken to the [facility] prior to this hospitalization on 07/25 by her husband and father because she stopped eating, stopped talking & became more withdrawn the day prior as per ER documentation. Father also reported a history of non medication compliance in pt typically after discharge, which always leads to a relapse in her symptoms.  Pt was IVC'd and transferred back to [facility] for treatment and, stabilization of her mental status.   Principal Problem: Schizoaffective disorder, bipolar type [facility]) Discharge Diagnoses: Principal Problem:   Schizoaffective disorder, bipolar type (HCC) Active Problems:   Hyperlipidemia   Type II diabetes mellitus (HCC)   GAD (generalized anxiety disorder)  Past Psychiatric History: Previous Psych Diagnoses: Schizoaffective disorder, bipolar type Prior inpatient treatment:  -08/08/2016 at this hospital-[facility] -0/12/2016 at [facility] -10/23/2018 at [facility] hospital[facility] -[facility]11/20[facility] Current/prior outpatient treatment: [facility] Previously, but was schedule for the [facility] Prior rehab hx: unknown.  Unable to assess at this time, information not present in chart review Psychotherapy hx: UTA, not present in chart review History of suicide attempts: UTA, not presently chart review, unable to reach family History of homicide or aggression: UTA, not presently chart  review, unable to reach family Psychiatric medication history: Medications currently as listed above.  Father reported that historically, Zyprexa in the past was effective, we will consider trying this medication if patient fails Risperdal.  As per chart review, has also tried Depakote, temazepam, Topamax. Psychiatric medication compliance history: History: Noncompliant Neuromodulation history: None Current Psychiatrist: [facility] Current therapist: None  Past Medical History:  Past Medical History:  Diagnosis Date   Bipolar 1 disorder (HCC)    Depression    Diabetes mellitus without complication (HCC)    Type 2 (Pre)   Schizophrenia (HCC)    History reviewed. No pertinent surgical history. Family History:  Family History  Problem Relation Age of Onset   Hypertension Father    Family Psychiatric  History: See H&P Social History:  Social History   Substance and Sexual Activity  Alcohol Use No   Alcohol/week: 0.0 standard drinks of alcohol     Social History   Substance and Sexual Activity  Drug Use No    Social History   Socioeconomic History   Marital status: Married    Spouse name: Amadou   Number of children: Not on file   Years of education: Not on file   Highest education level: Associate degree: occupational, Scientist, product/process development, or vocational program  Occupational History   Occupation: unemployed  Tobacco Use   Smoking status: Never   Smokeless tobacco: Never  Vaping Use   Vaping status: Never Used  Substance and Sexual Activity   Alcohol use: No    Alcohol/week: 0.0 standard drinks of alcohol   Drug use: No   Sexual activity: Yes    Birth control/protection: None  Other Topics Concern   Not on file  Social History Narrative   **  Merged History Encounter **       Social Determinants of Health   Financial Resource Strain: Not on file  Food Insecurity: Patient Declined (12/28/2022)   Hunger Vital Sign    Worried About Running Out of Food in the Last Year:  Patient declined    Ran Out of Food in the Last Year: Patient declined  Transportation Needs: Patient Declined (12/28/2022)   PRAPARE - Administrator, Civil Service (Medical): Patient declined    Lack of Transportation (Non-Medical): Patient declined  Physical Activity: Inactive (04/03/2019)   Exercise Vital Sign    Days of Exercise per Week: 0 days    Minutes of Exercise per Session: 0 min  Stress: Not on file  Social Connections: Socially Integrated (04/03/2019)   Social Connection and Isolation Panel [NHANES]    Frequency of Communication with Friends and Family: More than three times a week    Frequency of Social Gatherings with Friends and Family: Once a week    Attends Religious Services: More than 4 times per year    Active Member of [employer] or Organizations: Yes    Attends Banker Meetings: Not on file    Marital Status: Married   Hospital Course:  During the patient's hospitalization, patient had extensive initial psychiatric evaluation, and follow-up psychiatric evaluations every day.  Psychiatric diagnoses provided upon initial assessment:   Schizoaffective disorder, bipolar type (HCC) Active Problems:   Hyperlipidemia   Type II diabetes mellitus (HCC)   GAD (generalized anxiety disorder)   Patient's psychiatric medications were adjusted on admission:  -Start Risperdal M tabs 2 mg twice daily for psychosis/mood stabilization -Start Ativan 1 mg twice daily x 2 days for catatonia -Start simvastatin 10 mg daily in the evenings for hypercholesterolemia -Continue trazodone 150 mg nightly for sleep -Start Hydroxyzine 50 mg TID PRN for anxiety -Continue Agitation protocol medications (Haldol/Ativan/Benadryl IM/PO TID PRN).    During the hospitalization, other adjustments were made to the patient's psychiatric medication regimen:  Thorazine 300 mg p.o. 3 times daily for psychosis Zyprexa 10 mg p.o. nightly for psychosis  Patient's care was discussed  during the interdisciplinary team meeting every day during the hospitalization.  The patient denies having side effects to prescribed psychiatric medication.  Gradually, patient started adjusting to milieu. The patient was evaluated each day by a clinical provider to ascertain response to treatment. Improvement was noted by the patient's report of decreasing symptoms, improved sleep and appetite, affect, medication tolerance, behavior, and participation in unit programming.  Patient was asked each day to complete a self inventory noting mood, mental status, pain, new symptoms, anxiety and concerns.    Symptoms were reported as significantly decreased or resolved completely by discharge.   On day of discharge, the patient reports that their mood is stable. The patient denied having suicidal thoughts for more than 48 hours prior to discharge.  Patient denies having homicidal thoughts.  Patient denies having auditory hallucinations.  Patient denies any visual hallucinations or other symptoms of psychosis. The patient was motivated to continue taking medication with a goal of continued improvement in mental health.   The patient reports their target psychiatric symptoms of psychosis responded well to the psychiatric medications, and the patient reports overall benefit other psychiatric hospitalization. Supportive psychotherapy was provided to the patient. The patient also participated in regular group therapy while hospitalized. Coping skills, problem solving as well as relaxation therapies were also part of the unit programming.  Labs were reviewed with the  patient, and abnormal results were discussed with the patient.  The patient is able to verbalize their individual safety plan to this provider.  # It is recommended to the patient to continue psychiatric medications as prescribed, after discharge from the hospital.    # It is recommended to the patient to follow up with your outpatient psychiatric  provider and PCP.  # It was discussed with the patient, the impact of alcohol, drugs, tobacco have been there overall psychiatric and medical wellbeing, and total abstinence from substance use was recommended the patient.ed.  # Prescriptions provided or sent directly to preferred pharmacy at discharge. Patient agreeable to plan. Given opportunity to ask questions. Appears to feel comfortable with discharge.    # In the event of worsening symptoms, the patient is instructed to call the crisis hotline, 911 and or go to the nearest ED for appropriate evaluation and treatment of symptoms. To follow-up with primary care provider for other medical issues, concerns and or health care needs  # Patient was discharged home with a plan to follow up as noted below.   Physical Findings: AIMS: Facial and Oral Movements Muscles of Facial Expression: None, normal Lips and Perioral Area: None, normal Jaw: None, normal Tongue: None, normal,Extremity Movements Upper (arms, wrists, hands, fingers): None, normal Lower (legs, knees, ankles, toes): None, normal, Trunk Movements Neck, shoulders, hips: None, normal, Overall Severity Severity of abnormal movements (highest score from questions above): None, normal Incapacitation due to abnormal movements: None, normal Patient's awareness of abnormal movements (rate only patient's report): No Awareness, Dental Status Current problems with teeth and/or dentures?: No Does patient usually wear dentures?: No  CIWA:    COWS:     Musculoskeletal: Strength & Muscle Tone: within normal limits Gait & Station: normal Patient leans: N/A  Psychiatric Specialty Exam:  Presentation  General Appearance:  Appropriate for Environment; Casual  Eye Contact: Fair  Speech: Clear and Coherent  Speech Volume: Normal  Handedness: Right  Mood and Affect  Mood: Euthymic  Affect: Congruent  Thought Process  Thought Processes: Disorganized;  Coherent  Descriptions of Associations:Intact  Orientation:Full (Time, Place and Person)  Thought Content:Illogical  History of Schizophrenia/Schizoaffective disorder:Yes  Duration of Psychotic Symptoms:Greater than six months  Hallucinations:Hallucinations: None Description of Auditory Hallucinations: Denies Description of Visual Hallucinations: Denies  Ideas of Reference:None  Suicidal Thoughts:Suicidal Thoughts: No  Homicidal Thoughts:Homicidal Thoughts: No  Sensorium  Memory: Immediate Good; Recent Good  Judgment: Fair  Insight: Present  Executive Functions  Concentration: Fair  Attention Span: Fair  Recall: [employer] of Knowledge: Fair  Language: Fair  Psychomotor Activity  Psychomotor Activity: Psychomotor Activity: Normal  Assets  Assets: Physical Health; Resilience; Housing  Sleep  Sleep: Sleep: Good Number of Hours of Sleep: 8  Physical Exam: Physical Exam Vitals and nursing note reviewed.  HENT:     Head: Normocephalic.     Nose: Nose normal.     Mouth/Throat:     Mouth: Mucous membranes are moist.  Eyes:     Pupils: Pupils are equal, round, and reactive to light.  Cardiovascular:     Rate and Rhythm: Normal rate.     Pulses: Normal pulses.  Pulmonary:     Effort: Pulmonary effort is normal.  Abdominal:     Comments: Deferred  Genitourinary:    Comments: Deferred Musculoskeletal:        General: Normal range of motion.     Cervical back: Normal range of motion.  Skin:    General:  Skin is warm.  Neurological:     General: No focal deficit present.     Mental Status: She is alert and oriented to person, place, and time.  Psychiatric:        Mood and Affect: Mood normal.        Behavior: Behavior normal.        Thought Content: Thought content normal.    Review of Systems  Constitutional:  Negative for chills and fever.  HENT:  Negative for sore throat.   Eyes:  Negative for blurred vision.  Respiratory:   Negative for cough, shortness of breath and wheezing.   Cardiovascular:  Negative for chest pain and palpitations.  Gastrointestinal:  Negative for abdominal pain, constipation, heartburn, nausea and vomiting.  Genitourinary: Negative.   Musculoskeletal: Negative.   Skin:  Negative for itching and rash.  Neurological:  Negative for dizziness and headaches.  Endo/Heme/Allergies:        See allergy listing  Psychiatric/Behavioral:  The patient is nervous/anxious (Improved with medication).    Blood pressure 122/79, pulse 92, temperature 97.9 F (36.6 C), temperature source Oral, resp. rate 20, height 5\' 8"  (1.727 m), weight 101.2 kg, SpO2 100%. Body mass index is 33.91 kg/m.   Social History   Tobacco Use  Smoking Status Never  Smokeless Tobacco Never   Tobacco Cessation:  N/A, patient does not currently use tobacco products  Blood Alcohol level:  Lab Results  Component Value Date   ETH <10 12/27/2022   ETH <10 12/11/2022    Metabolic Disorder Labs:  Lab Results  Component Value Date   HGBA1C 6.2 (H) 12/11/2022   MPG 131 12/11/2022   MPG 111.15 10/23/2018   Lab Results  Component Value Date   PROLACTIN 34.3 (H) 12/30/2022   PROLACTIN 13.0 12/11/2022   Lab Results  Component Value Date   CHOL 137 01/05/2023   TRIG 62 01/05/2023   HDL 62 01/05/2023   CHOLHDL 2.2 01/05/2023   VLDL 12 01/05/2023   LDLCALC 63 01/05/2023   LDLCALC 113 (H) 12/11/2022    See Psychiatric Specialty Exam and Suicide Risk Assessment completed by Attending Physician prior to discharge.  Discharge destination:  Home  Is patient on multiple antipsychotic therapies at discharge:  Yes,   Do you recommend tapering to monotherapy for antipsychotics?  Yes   Has Patient had three or more failed trials of antipsychotic monotherapy by history:  Risperdal M tabs 2 mg twice daily for psychosis/mood stabilization; Discontinued Haldol 10 mg TID on 8/4 due to lack of effectiveness; Abilify 10 mg tablet  and Abilify sustained 400 mg.  Recommended Plan for Multiple Antipsychotic Therapies: Continuing Thorazine and Zyprexa, and pt is discharged on both meds, and outpatient provider will have to work towards weaning her down to one antipsychotic med. Both meds are needed at this time for better management of her psychosis, but still requiring agitation protocol meds occasionally.   Discharge Instructions     Increase activity slowly   Complete by: As directed       Allergies as of 01/18/2023       Reactions   Peanut-containing Drug Products Nausea And Vomiting   Pork-derived Products Rash, Other (See Comments)   Religious purposes        Medication List     STOP taking these medications    ARIPiprazole 10 MG tablet Commonly known as: ABILIFY   ARIPiprazole ER 400 MG Srer injection Commonly known as: ABILIFY MAINTENA   risperiDONE 1 MG disintegrating  tablet Commonly known as: RISPERDAL M-TABS   traZODone 150 MG tablet Commonly known as: DESYREL       TAKE these medications      Indication  benztropine 1 MG tablet Commonly known as: COGENTIN Take 1 tablet (1 mg total) by mouth 2 (two) times daily as needed for tremors.  Indication: Extrapyramidal Reaction caused by Medications   chlorproMAZINE 100 MG tablet Commonly known as: THORAZINE Take 1 tablet (100 mg total) by mouth 3 (three) times daily.  Indication: Psychosis, psychosis   chlorproMAZINE 50 MG tablet Commonly known as: THORAZINE Take 1 tablet (50 mg total) by mouth 3 (three) times daily as needed (agitation).  Indication: Psychosis   cloNIDine 0.1 MG tablet Commonly known as: CATAPRES Take 1 tablet (0.1 mg total) by mouth every 6 (six) hours as needed (syst bl pr>160 and/or diast bl pr>100).  Indication: High Blood Pressure Disorder   hydrOXYzine 50 MG tablet Commonly known as: ATARAX Take 1 tablet (50 mg total) by mouth 3 (three) times daily as needed for anxiety.  Indication: Feeling Anxious    nicotine polacrilex 2 MG gum Commonly known as: NICORETTE Take 1 each (2 mg total) by mouth as needed for smoking cessation.  Indication: Nicotine Addiction   OLANZapine zydis 10 MG disintegrating tablet Commonly known as: ZYPREXA Take 1 tablet (10 mg total) by mouth at bedtime.  Indication: Schizophrenia, psychosis   propranolol 10 MG tablet Commonly known as: INDERAL Take 1 tablet (10 mg total) by mouth 2 (two) times daily.  Indication: tachycardia   simvastatin 10 MG tablet Commonly known as: ZOCOR Take 1 tablet (10 mg total) by mouth daily at 6 PM for 14 days.  Indication: High Amount of Fats in the Blood   Vitamin D (Ergocalciferol) 1.25 MG (50000 UNIT) Caps capsule Commonly known as: DRISDOL Take 1 capsule (50,000 Units total) by mouth every 7 (seven) days. Start taking on: January 22, 2023  Indication: Vitamin D Deficiency        Follow-up Information     [facility] Go on 01/25/2023.   Why: Appointment on 01/25/23 at 4:00 pm for medication management services, in person. Contact information: [address] [place] [address] [phone]         [facility] Follow up on 01/21/2023.   Why: This provider will follow up with you in the community on Monday to get you established and connected with [facility] .  This provider has everyone contact information , if you do not answer to follow up with your care. Contact information: [address] [place] [address] [phone]                Follow-up recommendations:   Discharge Recommendations:  The patient is being discharged with her family. Patient is to take her discharge medications as ordered.  See follow up above. We recommend that she participates in individual therapy to target uncontrollable agitation and substance abuse.  We recommend that she participates in therapy to target the conflict with her family, to improve communication skills and  conflict resolution skills.  patient is to initiate/implement a contingency based behavioral model to address patient's behavior. We recommend that she gets AIMS scale, height, weight, blood pressure, fasting lipid panel, fasting blood sugar in three months from discharge if she's on atypical antipsychotics.  Patient will benefit from monitoring of recurrent suicidal ideation since patient is on antidepressant medication. The patient should abstain from all illicit  substances and alcohol. If the patient's symptoms worsen or do not continue to improve or if the patient becomes actively suicidal or homicidal then it is recommended that the patient return to the closest hospital emergency room or call 911 for further evaluation and treatment. National Suicide Prevention Lifeline 1800-SUICIDE or [phone]) [phone]. Please follow up with your primary medical doctor for all other medical needs.  The patient has been educated on the possible side effects to medications and she/her guardian is to contact a medical professional and inform outpatient provider of any new side effects of medication. She is to take regular diet and activity as tolerated.  Will benefit from moderate daily exercise. Patient was educated about removing/locking any firearms, medications or dangerous products from the home.  Activity:  As tolerated Diet:  Regular Diet Signed:  Cecilie Lowers, FNP 01/18/2023, 9:00 PM

## 2023-01-18 NOTE — Progress Notes (Signed)
[facility] Post 1:1 Observation Documentation  For the first (8) hours following discontinuation of 1:1 precautions, a progress note entry by nursing staff should be documented at least every 2 hours, reflecting the patient's behavior, condition, mood, and conversation.  Use the progress notes for additional entries.  Time 1:1 discontinued:  11:24 AM  Patient's Behavior:  Patient is calm and cooperative.  Patient's Condition:  Patient is calm and cooperative  Patient's Conversation:  Patient visible in milieu interacting well with staff and peers.  Barbara George 01/18/2023, 5:52 PM

## 2023-01-18 NOTE — Progress Notes (Signed)
Nursing 1:1 note D:Pt observed sleeping in bed with eyes closed. RR even and unlabored. No distress noted. A: 1:1 observation continues for safety  R: pt remains safe  

## 2023-01-18 NOTE — Progress Notes (Signed)
1:1 Note: Patient maintained on constant supervision for safety.  Medications given as prescribed.  Patient visible in milieu interacting well with staff and peers.  Patient is safe on the unit with supervision.

## 2023-01-18 NOTE — Progress Notes (Signed)
Nurs Dischg Note:  D:Patient denies SI/HI/AVH at this time. Pt appears calm and cooperative, and no distress noted.  A:  Pt given AVS/ SRA / BH Transition Pt escorted to the lobby to go home with her brother.  R:  Pt States she will comply with outpatient / other services, and take MEDS as prescribed.

## 2023-01-18 NOTE — Progress Notes (Signed)
[facility] Post 1:1 Observation Documentation  For the first (8) hours following discontinuation of 1:1 precautions, a progress note entry by nursing staff should be documented at least every 2 hours, reflecting the patient's behavior, condition, mood, and conversation.  Use the progress notes for additional entries.  Time 1:1 discontinued:  11:24  Patient's Behavior:  Patient is calm and appropriate  Patient's Condition:  Patient is calm and appropriate  Patient's Conversation:  Patient is visible in milieu  Barbara George 01/18/2023, 5:58 PM

## 2023-01-18 NOTE — [facility] Suicide Risk Assessment (Signed)
Suicide Risk Assessment  Discharge Assessment    [facility] Discharge Suicide Risk Assessment   Principal Problem: Schizoaffective disorder, bipolar type [facility]) Discharge Diagnoses: Principal Problem:   Schizoaffective disorder, bipolar type (HCC) Active Problems:   Hyperlipidemia   Type II diabetes mellitus (HCC)   GAD (generalized anxiety disorder)  Reason for admission:  Barbara George is a 27 yo AAF with prior mental health diagnosis of Schizoaffective d/o who was recently treated at this [facility] from 7/09 through 12/21/22. Pt was taken to the [facility] prior to this hospitalization on 07/25 by her husband and father because she stopped eating, stopped talking & became more withdrawn the day prior as per ER documentation. Father also reported a history of non medication compliance in pt typically after discharge, which always leads to a relapse in her symptoms.  Pt was IVC'd and transferred back to [facility] for treatment and, stabilization of her mental status.   Total Time spent with patient: 30 minutes  Musculoskeletal: Strength & Muscle Tone: within normal limits Gait & Station: normal Patient leans: N/A  Psychiatric Specialty Exam  Presentation  General Appearance:  Appropriate for Environment; Casual  Eye Contact: Fair  Speech: Clear and Coherent  Speech Volume: Normal  Handedness: Right  Mood and Affect  Mood: Euthymic  Duration of Depression Symptoms: Greater than two weeks  Affect: Congruent  Thought Process  Thought Processes: Disorganized; Coherent  Descriptions of Associations:Intact  Orientation:Full (Time, Place and Person)  Thought Content:Illogical  History of Schizophrenia/Schizoaffective disorder:Yes  Duration of Psychotic Symptoms:Greater than six months  Hallucinations:Hallucinations: None Description of Auditory Hallucinations: Denies Description of Visual Hallucinations: Denies  Ideas of Reference:None  Suicidal  Thoughts:Suicidal Thoughts: No  Homicidal Thoughts:Homicidal Thoughts: No  Sensorium  Memory: Immediate Good; Recent Good  Judgment: Fair  Insight: Present  Executive Functions  Concentration: Fair  Attention Span: Fair  Recall: [employer] of Knowledge: Fair  Language: Fair  Psychomotor Activity  Psychomotor Activity: Psychomotor Activity: Normal  Assets  Assets: Physical Health; Resilience; Housing  Sleep  Sleep: Sleep: Good Number of Hours of Sleep: 8  Physical Exam: Physical Exam Vitals and nursing note reviewed.  Constitutional:      Appearance: Normal appearance. She is obese.  HENT:     Head: Normocephalic.     Nose: Nose normal.     Mouth/Throat:     Mouth: Mucous membranes are moist.  Cardiovascular:     Rate and Rhythm: Normal rate.     Pulses: Normal pulses.  Pulmonary:     Effort: Pulmonary effort is normal.  Abdominal:     Comments: Deferred  Genitourinary:    Comments: Deferred Musculoskeletal:        General: Normal range of motion.     Cervical back: Normal range of motion.  Skin:    General: Skin is warm.  Neurological:     General: No focal deficit present.     Mental Status: She is alert and oriented to person, place, and time.  Psychiatric:        Mood and Affect: Mood normal.        Behavior: Behavior normal.    Review of Systems  Constitutional:  Negative for chills and fever.  HENT:  Negative for sore throat.   Eyes:  Negative for blurred vision.  Respiratory:  Negative for cough, shortness of breath and wheezing.   Cardiovascular:  Negative for chest pain and palpitations.  Gastrointestinal:  Negative for abdominal pain, heartburn, nausea and vomiting.  Genitourinary:  Negative for dysuria, frequency and urgency.  Musculoskeletal: Negative.   Skin:  Negative for itching and rash.  Neurological:  Negative for dizziness, tingling, tremors and headaches.  Endo/Heme/Allergies:        See allergy listing   Psychiatric/Behavioral:  Positive for hallucinations (Stable with medication). The patient is nervous/anxious (Improved with medication).    Blood pressure 122/79, pulse 92, temperature 97.9 F (36.6 C), temperature source Oral, resp. rate 20, height 5\' 8"  (1.727 m), weight 101.2 kg, SpO2 100%. Body mass index is 33.91 kg/m.  Mental Status Per Nursing Assessment::   On Admission:  NA  Demographic Factors:  Low socioeconomic status and Unemployed  Loss Factors: Decrease in vocational status and Financial problems/change in socioeconomic status  Historical Factors: NA  Risk Reduction Factors:   Religious beliefs about death, Positive social support, Positive therapeutic relationship, and Positive coping skills or problem solving skills  Continued Clinical Symptoms:  More than one psychiatric diagnosis Previous Psychiatric Diagnoses and Treatments Medical Diagnoses and Treatments/Surgeries  Cognitive Features That Contribute To Risk:  Polarized thinking    Suicide Risk:  Mild:  Suicidal ideation of limited frequency, intensity, duration, and specificity.  There are no identifiable plans, no associated intent, mild dysphoria and related symptoms, good self-control (both objective and subjective assessment), few other risk factors, and identifiable protective factors, including available and accessible social support.   Follow-up Information     [facility] Go on 01/25/2023.   Why: Appointment on 01/25/23 at 4:00 pm for medication management services, in person. Contact information: [address] [place] [address] [phone]         [facility] Follow up on 01/21/2023.   Why: This provider will follow up with you in the community on Monday to get you established and connected with [facility] .  This provider has everyone contact information , if you do not answer to follow up with your care. Contact information: [address] [place] [address] [phone]                Plan Of Care/Follow-up recommendations:  Discharge Recommendations:  The patient is being discharged with her family. Patient is to take her discharge medications as ordered.  See follow up above. We recommend that she participates in individual therapy to target uncontrollable agitation and substance abuse.  We recommend that she participates in therapy to target the conflict with her family, to improve communication skills and conflict resolution skills.  patient is to initiate/implement a contingency based behavioral model to address patient's behavior. We recommend that she gets AIMS scale, height, weight, blood pressure, fasting lipid panel, fasting blood sugar in three months from discharge if she's on atypical antipsychotics.  Patient will benefit from monitoring of recurrent suicidal ideation since patient is on antidepressant medication. The patient should abstain from all illicit substances and alcohol. If the patient's symptoms worsen or do not continue to improve or if the patient becomes actively suicidal or homicidal then it is recommended that the patient return to the closest hospital emergency room or call 911 for further evaluation and treatment. National Suicide Prevention Lifeline 1800-SUICIDE or [phone]. Please follow up with your primary medical doctor for all other medical needs.  The patient has been educated on the possible side effects to medications and she/her guardian is to contact a medical professional and inform outpatient provider of any new side effects of medication. She is to take regular  diet and activity as tolerated.  Will benefit from moderate daily exercise. Patient was educated about removing/locking any firearms, medications or dangerous products from the home.  Activity:  As tolerated Diet:  Regular Diet  Cecilie Lowers, FNP 01/18/2023, 11:34 AM

## 2023-01-18 NOTE — Progress Notes (Signed)
Pt discharged to lobby. Pt was stable and appreciative at that time. All papers and prescriptions were given and valuables returned. Verbal understanding expressed. Denies SI/HI and A/VH. Pt given opportunity to express concerns and ask questions.  

## 2023-01-18 NOTE — Progress Notes (Signed)
[facility] Post 1:1 Observation Documentation  For the first (8) hours following discontinuation of 1:1 precautions, a progress note entry by nursing staff should be documented at least every 2 hours, reflecting the patient's behavior, condition, mood, and conversation.  Use the progress notes for additional entries.  Time 1:1 discontinued:  11:24 PM  Patient's Behavior:  Patient is calm and appropriate  Patient's Condition:  Patient is calm and appropriate  Patient's Conversation:  Patient interacting well with peers.  Barbara George 01/18/2023, 5:44 PM

## 2023-01-18 NOTE — BH IP Treatment Plan (Signed)
Interdisciplinary Treatment and Diagnostic Plan Update  01/18/2023 Time of Session: 9:30AM (UPDATE) Barbara George MRN: [id number]  Principal Diagnosis: Schizoaffective disorder, bipolar type (HCC)  Secondary Diagnoses: Principal Problem:   Schizoaffective disorder, bipolar type (HCC) Active Problems:   Hyperlipidemia   Type II diabetes mellitus (HCC)   GAD (generalized anxiety disorder)   Current Medications:  Current Facility-Administered Medications  Medication Dose Route Frequency Provider Last Rate Last Admin   acetaminophen (TYLENOL) tablet 650 mg  650 mg Oral Q6H PRN Massengill, Harrold Donath, MD   650 mg at 01/15/23 0333   alum & mag hydroxide-simeth (MAALOX/MYLANTA) 200-200-20 MG/5ML suspension 30 mL  30 mL Oral Q4H PRN Massengill, Harrold Donath, MD       benztropine (COGENTIN) tablet 1 mg  1 mg Oral BID PRN Starleen Blue, NP       Or   benztropine mesylate (COGENTIN) injection 1 mg  1 mg Intramuscular BID PRN Starleen Blue, NP       chlorproMAZINE (THORAZINE) tablet 100 mg  100 mg Oral TID Starleen Blue, NP   100 mg at 01/18/23 0805   chlorproMAZINE (THORAZINE) tablet 50 mg  50 mg Oral TID PRN Phineas Inches, MD   50 mg at 01/15/23 0502   cloNIDine (CATAPRES) tablet 0.1 mg  0.1 mg Oral Q6H PRN Abbott Pao, Nadir, MD       diphenhydrAMINE (BENADRYL) capsule 50 mg  50 mg Oral TID PRN Phineas Inches, MD   50 mg at 01/15/23 0502   Or   diphenhydrAMINE (BENADRYL) injection 50 mg  50 mg Intramuscular TID PRN Phineas Inches, MD   50 mg at 01/13/23 0515   hydrOXYzine (ATARAX) tablet 50 mg  50 mg Oral TID PRN Phineas Inches, MD   50 mg at 01/17/23 2050   LORazepam (ATIVAN) tablet 2 mg  2 mg Oral TID PRN Phineas Inches, MD   2 mg at 01/15/23 0503   Or   LORazepam (ATIVAN) injection 2 mg  2 mg Intramuscular TID PRN Massengill, Harrold Donath, MD   2 mg at 01/13/23 0515   magnesium hydroxide (MILK OF MAGNESIA) suspension 30 mL  30 mL Oral Daily PRN Massengill, Harrold Donath, MD        nicotine polacrilex (NICORETTE) gum 2 mg  2 mg Oral PRN Starleen Blue, NP   2 mg at 01/15/23 0916   OLANZapine zydis (ZYPREXA) disintegrating tablet 10 mg  10 mg Oral QHS Ntuen, Tina C, FNP   10 mg at 01/17/23 2050   propranolol (INDERAL) tablet 10 mg  10 mg Oral BID Starleen Blue, NP   10 mg at 01/17/23 2050   simvastatin (ZOCOR) tablet 10 mg  10 mg Oral q1800 Starleen Blue, NP   10 mg at 01/17/23 1807   Vitamin D (Ergocalciferol) (DRISDOL) 1.25 MG (50000 UNIT) capsule 50,000 Units  50,000 Units Oral Q7 days Starleen Blue, NP   50,000 Units at 01/15/23 2130   PTA Medications: Medications Prior to Admission  Medication Sig Dispense Refill Last Dose   ARIPiprazole (ABILIFY) 10 MG tablet Take 1 tablet (10 mg total) by mouth as directed for 15 doses. Taper off oral abilify as follows: Take 2 tablets once daily for 5 days. Then decrease to taking 1 tablet once daily for 5 days. Then stop. 15 tablet 0    ARIPiprazole ER (ABILIFY MAINTENA) 400 MG SRER injection Inject 2 mLs (400 mg total) into the muscle every 28 (twenty-eight) days for 1 dose. Next dose is due on 01-15-23. 1 each 0  hydrOXYzine (ATARAX) 50 MG tablet Take 1 tablet (50 mg total) by mouth 3 (three) times daily as needed for anxiety. 30 tablet 0    risperiDONE (RISPERDAL M-TABS) 1 MG disintegrating tablet Take 1 tablet (1 mg total) by mouth at bedtime. 30 tablet 0    simvastatin (ZOCOR) 10 MG tablet Take 1 tablet (10 mg total) by mouth daily at 6 PM for 14 days. 14 tablet 0    traZODone (DESYREL) 150 MG tablet Take 1 tablet (150 mg total) by mouth at bedtime. 30 tablet 0     Patient Stressors:    Patient Strengths:    Treatment Modalities: Medication Management, Group therapy, Case management,  1 to 1 session with clinician, Psychoeducation, Recreational therapy.   Physician Treatment Plan for Primary Diagnosis: Schizoaffective disorder, bipolar type (HCC) Long Term Goal(s): Improvement in symptoms so as ready for discharge    Short Term Goals: Ability to identify changes in lifestyle to reduce recurrence of condition will improve Ability to verbalize feelings will improve Ability to disclose and discuss suicidal ideas Ability to demonstrate self-control will improve Ability to identify and develop effective coping behaviors will improve Ability to maintain clinical measurements within normal limits will improve Compliance with prescribed medications will improve  Medication Management: Evaluate patient's response, side effects, and tolerance of medication regimen.  Therapeutic Interventions: 1 to 1 sessions, Unit Group sessions and Medication administration.  Evaluation of Outcomes: Adequate for Discharge  Physician Treatment Plan for Secondary Diagnosis: Principal Problem:   Schizoaffective disorder, bipolar type (HCC) Active Problems:   Hyperlipidemia   Type II diabetes mellitus (HCC)   GAD (generalized anxiety disorder)  Long Term Goal(s): Improvement in symptoms so as ready for discharge   Short Term Goals: Ability to identify changes in lifestyle to reduce recurrence of condition will improve Ability to verbalize feelings will improve Ability to disclose and discuss suicidal ideas Ability to demonstrate self-control will improve Ability to identify and develop effective coping behaviors will improve Ability to maintain clinical measurements within normal limits will improve Compliance with prescribed medications will improve     Medication Management: Evaluate patient's response, side effects, and tolerance of medication regimen.  Therapeutic Interventions: 1 to 1 sessions, Unit Group sessions and Medication administration.  Evaluation of Outcomes: Adequate for Discharge   RN Treatment Plan for Primary Diagnosis: Schizoaffective disorder, bipolar type (HCC) Long Term Goal(s): Knowledge of disease and therapeutic regimen to maintain health will improve  Short Term Goals: Ability to remain  free from injury will improve, Ability to verbalize frustration and anger appropriately will improve, Ability to participate in decision making will improve, Ability to verbalize feelings will improve, Ability to identify and develop effective coping behaviors will improve, and Compliance with prescribed medications will improve  Medication Management: RN will administer medications as ordered by provider, will assess and evaluate patient's response and provide education to patient for prescribed medication. RN will report any adverse and/or side effects to prescribing provider.  Therapeutic Interventions: 1 on 1 counseling sessions, Psychoeducation, Medication administration, Evaluate responses to treatment, Monitor vital signs and CBGs as ordered, Perform/monitor CIWA, COWS, AIMS and Fall Risk screenings as ordered, Perform wound care treatments as ordered.  Evaluation of Outcomes: Adequate for Discharge   LCSW Treatment Plan for Primary Diagnosis: Schizoaffective disorder, bipolar type (HCC) Long Term Goal(s): Safe transition to appropriate next level of care at discharge, Engage patient in therapeutic group addressing interpersonal concerns.  Short Term Goals: Engage patient in aftercare planning with referrals and resources,  Increase social support, Increase emotional regulation, Facilitate acceptance of mental health diagnosis and concerns, Identify triggers associated with mental health/substance abuse issues, and Increase skills for wellness and recovery  Therapeutic Interventions: Assess for all discharge needs, 1 to 1 time with Social worker, Explore available resources and support systems, Assess for adequacy in community support network, Educate family and significant other(s) on suicide prevention, Complete Psychosocial Assessment, Interpersonal group therapy.  Evaluation of Outcomes: Adequate for Discharge   Progress in Treatment: Attending groups: Yes. Participating in groups:  Yes. Taking medication as prescribed: Yes. Toleration medication: Yes. Family/Significant other contact made: Yes, individual(s) contacted:  Ridwane ( brother ) [phone] Patient understands diagnosis: No. Discussing patient identified problems/goals with staff: Yes. Medical problems stabilized or resolved: Yes. Denies suicidal/homicidal ideation: Yes. Issues/concerns per patient self-inventory: No.  New problem(s) identified: No, Describe:  None reported  New Short Term/Long Term Goal(s):medication stabilization, elimination of SI thoughts, development of comprehensive mental wellness plan.    Patient Goals:  "help with depression"  Discharge Plan or Barriers: Patient recently admitted. CSW will continue to follow and assess for appropriate referrals and possible discharge planning.   Reason for Continuation of Hospitalization: Delusions  Depression Medication stabilization  Estimated Length of Stay:  Adequate for Discharge  Last 3 [place] Suicide Severity Risk Score: Flowsheet Row [facility] Most recent reading at 12/28/2022  5:00 PM Ad[facility] (Discharged[facility] Most recent reading at 12/11/2022  9:27 PM ED from 12/11/2022 in [facility] Most recent reading at 12/11/2022 11:51 AM  C-SSRS RISK CATEGORY No Risk No Risk No Risk       Last PHQ 2/9 Scores:    12/10/2022    8:03 AM 09/17/2022    1:18 PM 07/24/2021    3:15 PM  Depression screen PHQ 2/9  Decreased Interest 0 1 0  Down, Depressed, Hopeless 3 0 0  PHQ - 2 Score 3 1 0  Altered sleeping 3 0   Tired, decreased energy 1 1   Change in appetite 1 0   Feeling bad or failure about yourself  1 0   Trouble concentrating 2 0   Moving slowly or fidgety/restless 2 0   Suicidal thoughts 0 0   PHQ-9 Score 13 2   Difficult doing work/chores Very difficult      Scribe  for Treatment Team: Izell Halls, [place] 01/18/2023 12:24 PM

## 2023-10-11 IMAGING — US US PELVIS COMPLETE WITH TRANSVAGINAL
1 series · 15 of 25 positions shown · non-contrast
Comparison: 08/05/2019

CLINICAL DATA: Extended vaginal bleeding, menses lasting 20-25 days
during the last 2 months LMP 04/28/2021



[Series 1: us pelvis complete with transvaginal · 59 acquisitions, 15 frames shown]
[im 1/59]
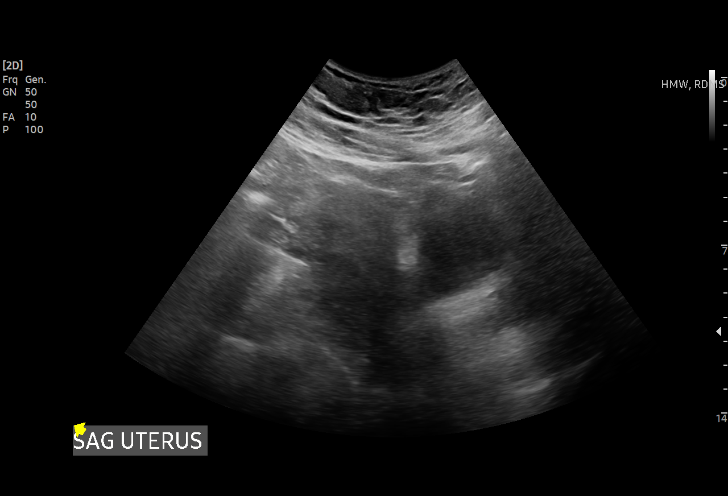
[im 5/59]
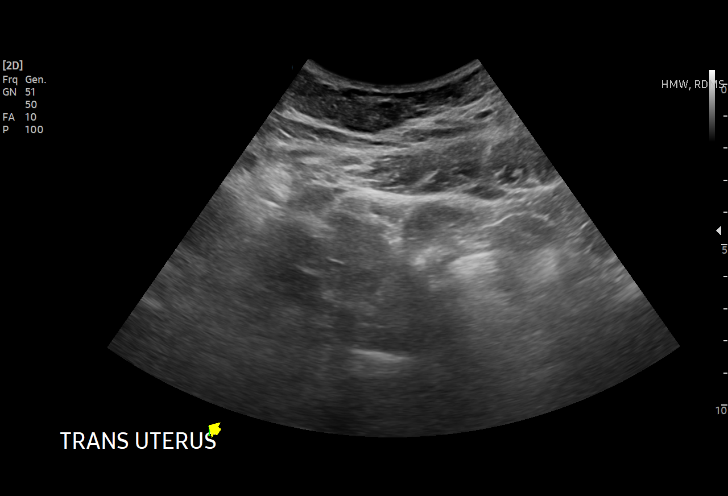
[im 10/59]
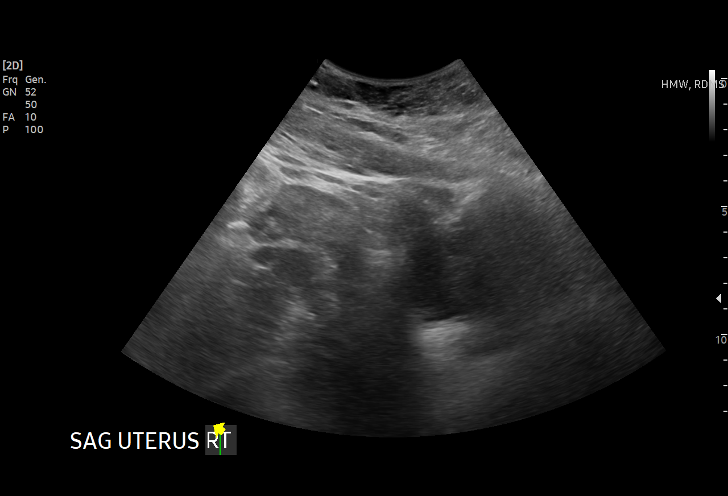
[im 13/59]
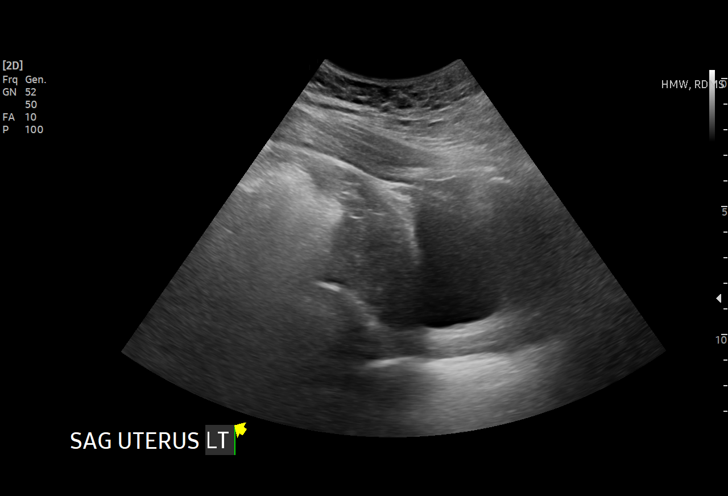
[im 17/59]
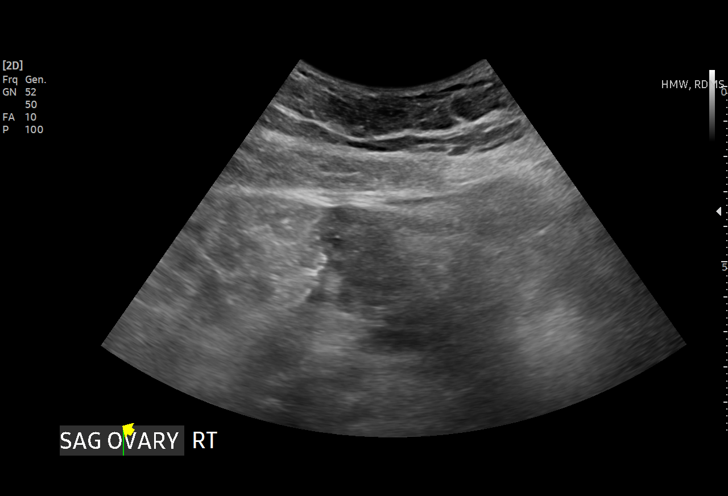
[im 22/59]
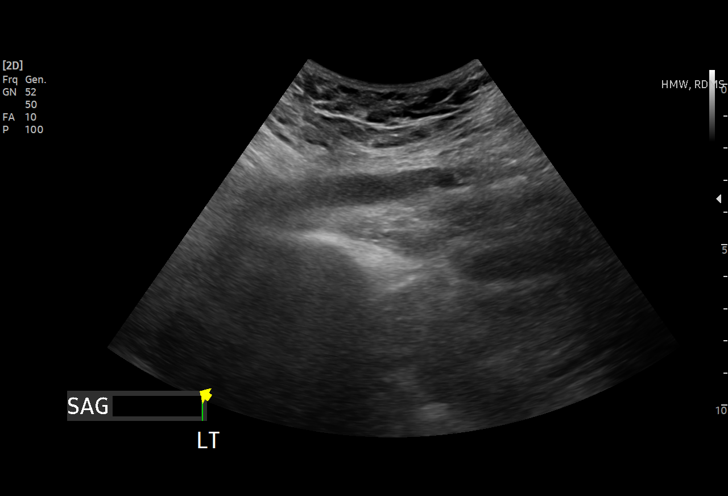
[im 25/59]
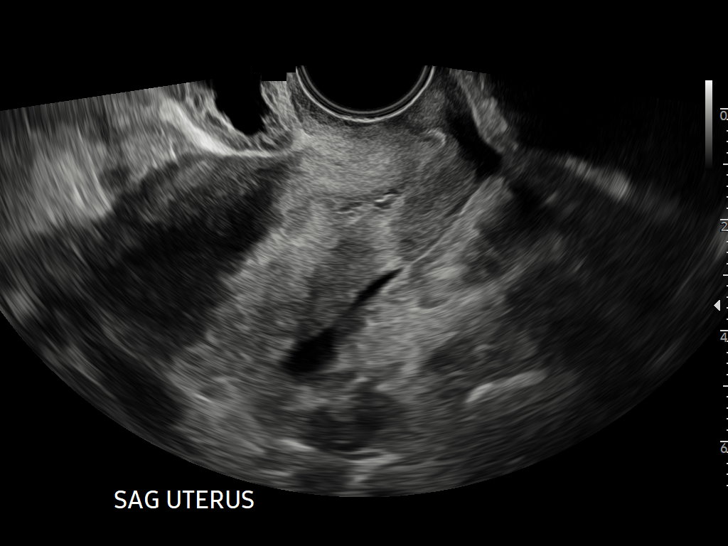
[im 30/59]
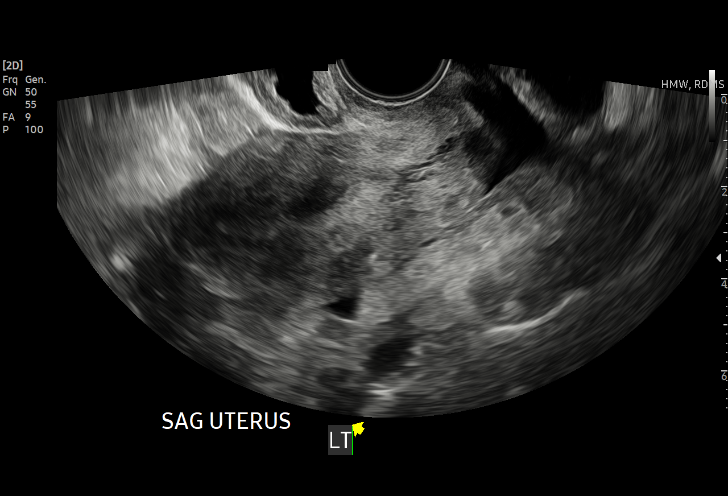
[im 34/59]
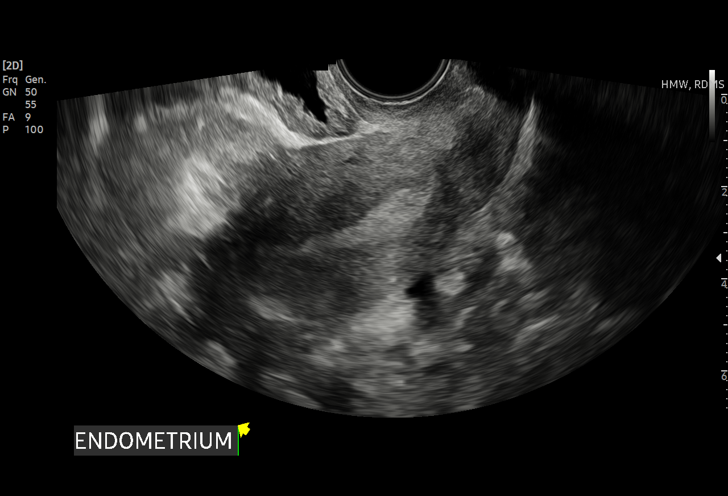
[im 37/59]
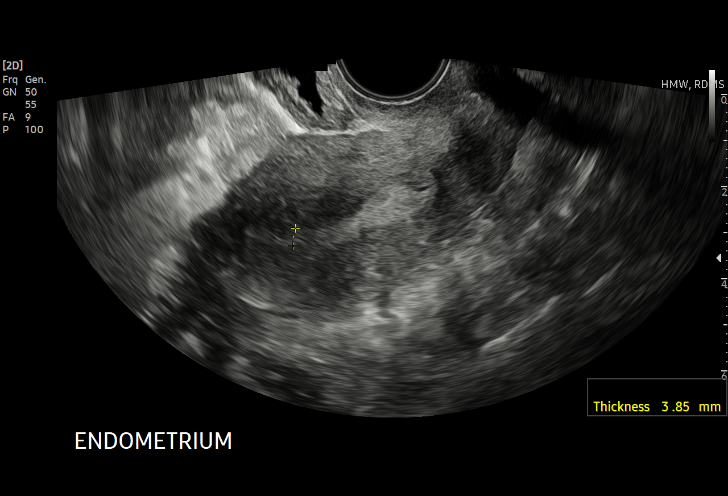
[im 42/59]
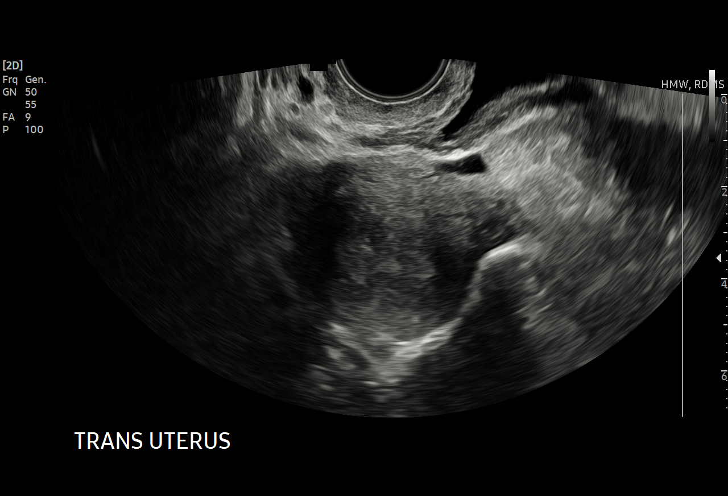
[im 46/59]
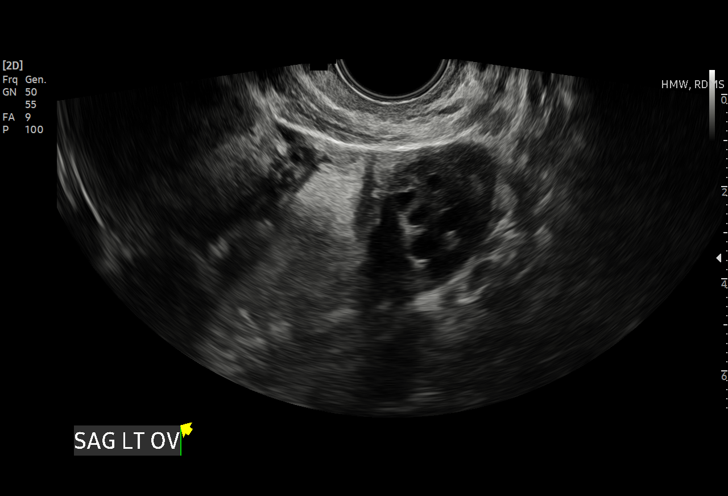
[im 49/59]
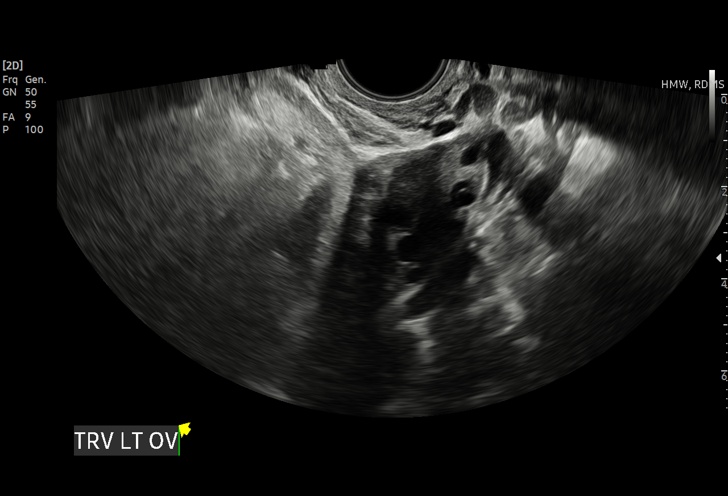
[im 54/59]
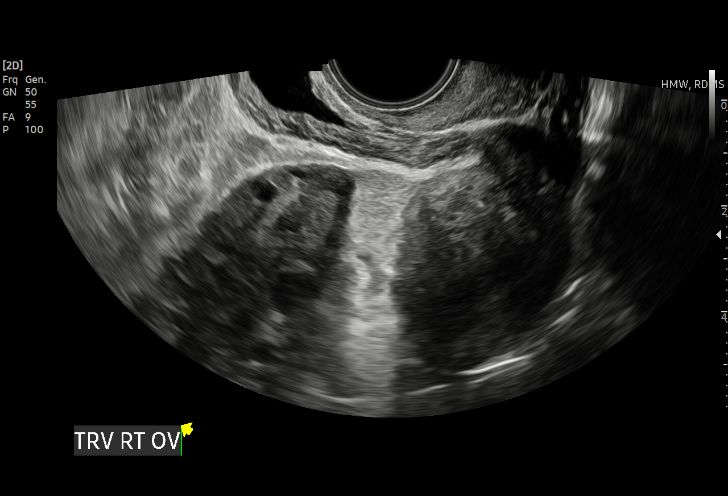
[im 59/59]
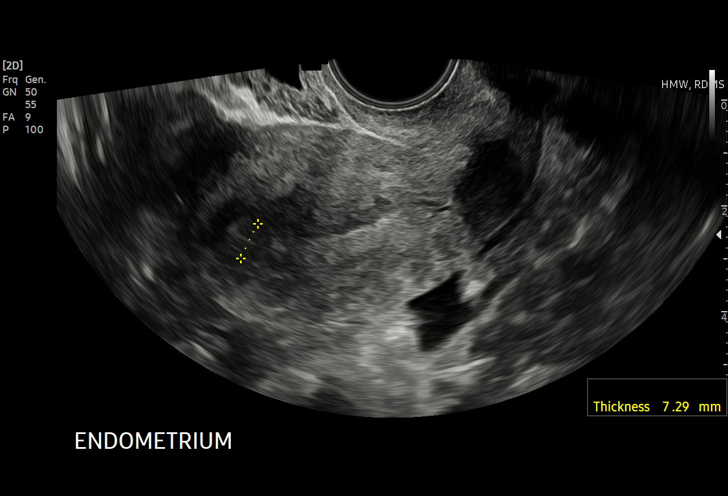

[15 of 25 positions shown; findings below may reference images not displayed]

FINDINGS: Uterus

Measurements: 8.6 x 4.1 x 4.2 cm = volume: 78 mL. Anteverted. Normal
morphology without mass

Endometrium

Thickness: 8 mm.  No endometrial fluid or mass

Right ovary

Measurements: 2.9 x 1.7 x 2.6 cm = volume: 6.4 mL. Normal morphology
without mass

Left ovary

Measurements: 4.0 x 2.3 x 2.2 cm = volume: 10.4 mL. Normal
morphology without mass

Other findings

Trace nonspecific free pelvic fluid.  No adnexal masses.
IMPRESSION: No pelvic sonographic abnormalities identified.

## 2024-07-07 ENCOUNTER — Ambulatory Visit (HOSPITAL_COMMUNITY)
Admission: EM | Admit: 2024-07-07 | Discharge: 2024-07-07 | Disposition: A | Discharge status: Home / Self Care | Payer: MEDICAID | Source: Home / Self Care

## 2024-07-07 ENCOUNTER — Encounter (HOSPITAL_COMMUNITY): Payer: Self-pay

## 2024-07-07 DIAGNOSIS — N946 Dysmenorrhea, unspecified: Secondary | ICD-10-CM | POA: Diagnosis not present

## 2024-07-07 DIAGNOSIS — N939 Abnormal uterine and vaginal bleeding, unspecified: Secondary | ICD-10-CM | POA: Diagnosis not present

## 2024-07-07 LAB — POCT URINE PREGNANCY: Preg Test, Ur: NEGATIVE

## 2024-07-07 MED ORDER — IBUPROFEN 200 MG PO TABS
600.0000 mg | ORAL_TABLET | Freq: Once | ORAL | Status: AC
  Administered 2024-07-07: 600 mg via ORAL

## 2024-07-07 MED ORDER — IBUPROFEN 200 MG PO TABS
ORAL_TABLET | ORAL | Status: AC
  Filled 2024-07-07: qty 3

## 2024-07-07 NOTE — ED Triage Notes (Signed)
 Pt states heavy vaginal bleeding and abdominal cramping. States it started 2 days.  States her periods are irregular and that she had some spotting on 06/23/2024.

## 2024-07-07 NOTE — Discharge Instructions (Addendum)
 Your urine pregnancy test was negative. Please take tylenol  or ibuprofen  for menstrual cramps, we gave you ibuprofen  in office today. Follow up with PCP/GYN-call for appt if this is recurrent issue If your bleeding worsens or pain worsens go to Er for further evaluation

## 2024-07-29 ENCOUNTER — Ambulatory Visit: Payer: Self-pay | Admitting: Obstetrics & Gynecology
